# Patient Record
Sex: Female | Born: 1955 | Race: White | Hispanic: No | Marital: Married | Smoking: Former smoker
Health system: Southern US, Community
[De-identification: ages and names within clinical notes are randomized; demographics above are authoritative.]

## PROBLEM LIST (undated history)

## (undated) DIAGNOSIS — R7303 Prediabetes: Secondary | ICD-10-CM

## (undated) DIAGNOSIS — M7989 Other specified soft tissue disorders: Secondary | ICD-10-CM

## (undated) DIAGNOSIS — R609 Edema, unspecified: Secondary | ICD-10-CM

## (undated) DIAGNOSIS — R5383 Other fatigue: Secondary | ICD-10-CM

## (undated) DIAGNOSIS — M2550 Pain in unspecified joint: Secondary | ICD-10-CM

## (undated) DIAGNOSIS — C50919 Malignant neoplasm of unspecified site of unspecified female breast: Secondary | ICD-10-CM

## (undated) DIAGNOSIS — K5900 Constipation, unspecified: Secondary | ICD-10-CM

## (undated) DIAGNOSIS — R42 Dizziness and giddiness: Secondary | ICD-10-CM

## (undated) DIAGNOSIS — Z800 Family history of malignant neoplasm of digestive organs: Secondary | ICD-10-CM

## (undated) DIAGNOSIS — I890 Lymphedema, not elsewhere classified: Secondary | ICD-10-CM

## (undated) DIAGNOSIS — Z9889 Other specified postprocedural states: Secondary | ICD-10-CM

## (undated) DIAGNOSIS — Z8049 Family history of malignant neoplasm of other genital organs: Secondary | ICD-10-CM

## (undated) DIAGNOSIS — Z923 Personal history of irradiation: Secondary | ICD-10-CM

## (undated) DIAGNOSIS — M549 Dorsalgia, unspecified: Secondary | ICD-10-CM

## (undated) DIAGNOSIS — Z8042 Family history of malignant neoplasm of prostate: Secondary | ICD-10-CM

## (undated) DIAGNOSIS — D649 Anemia, unspecified: Secondary | ICD-10-CM

## (undated) DIAGNOSIS — R112 Nausea with vomiting, unspecified: Secondary | ICD-10-CM

## (undated) DIAGNOSIS — Z803 Family history of malignant neoplasm of breast: Secondary | ICD-10-CM

## (undated) DIAGNOSIS — M1990 Unspecified osteoarthritis, unspecified site: Secondary | ICD-10-CM

## (undated) DIAGNOSIS — G4730 Sleep apnea, unspecified: Secondary | ICD-10-CM

## (undated) DIAGNOSIS — R0602 Shortness of breath: Secondary | ICD-10-CM

## (undated) DIAGNOSIS — M7918 Myalgia, other site: Secondary | ICD-10-CM

## (undated) DIAGNOSIS — E7800 Pure hypercholesterolemia, unspecified: Secondary | ICD-10-CM

## (undated) HISTORY — DX: Family history of malignant neoplasm of breast: Z80.3

## (undated) HISTORY — DX: Shortness of breath: R06.02

## (undated) HISTORY — DX: Edema, unspecified: R60.9

## (undated) HISTORY — DX: Malignant neoplasm of unspecified site of unspecified female breast: C50.919

## (undated) HISTORY — DX: Dorsalgia, unspecified: M54.9

## (undated) HISTORY — DX: Pain in unspecified joint: M25.50

## (undated) HISTORY — DX: Anemia, unspecified: D64.9

## (undated) HISTORY — DX: Other specified soft tissue disorders: M79.89

## (undated) HISTORY — DX: Sleep apnea, unspecified: G47.30

## (undated) HISTORY — DX: Pure hypercholesterolemia, unspecified: E78.00

## (undated) HISTORY — DX: Family history of malignant neoplasm of other genital organs: Z80.49

## (undated) HISTORY — DX: Constipation, unspecified: K59.00

## (undated) HISTORY — PX: OTHER SURGICAL HISTORY: SHX169

## (undated) HISTORY — DX: Myalgia, other site: M79.18

## (undated) HISTORY — DX: Family history of malignant neoplasm of prostate: Z80.42

## (undated) HISTORY — DX: Family history of malignant neoplasm of digestive organs: Z80.0

## (undated) HISTORY — PX: BACK SURGERY: SHX140

## (undated) HISTORY — DX: Other fatigue: R53.83

## (undated) HISTORY — DX: Dizziness and giddiness: R42

---

## 1992-01-31 HISTORY — PX: BREAST LUMPECTOMY: SHX2

## 1993-01-30 HISTORY — PX: CERVICAL FUSION: SHX112

## 1998-03-05 ENCOUNTER — Emergency Department (HOSPITAL_COMMUNITY): Admission: EM | Admit: 1998-03-05 | Discharge: 1998-03-05 | Payer: Self-pay | Admitting: Emergency Medicine

## 1998-03-05 ENCOUNTER — Encounter: Payer: Self-pay | Admitting: Emergency Medicine

## 1998-11-02 ENCOUNTER — Other Ambulatory Visit: Admission: RE | Admit: 1998-11-02 | Discharge: 1998-11-02 | Payer: Self-pay | Admitting: Obstetrics and Gynecology

## 1999-03-15 ENCOUNTER — Encounter: Payer: Self-pay | Admitting: Neurological Surgery

## 1999-03-15 ENCOUNTER — Ambulatory Visit (HOSPITAL_COMMUNITY): Admission: RE | Admit: 1999-03-15 | Discharge: 1999-03-15 | Payer: Self-pay | Admitting: Neurological Surgery

## 1999-04-13 ENCOUNTER — Ambulatory Visit (HOSPITAL_COMMUNITY): Admission: RE | Admit: 1999-04-13 | Discharge: 1999-04-13 | Payer: Self-pay | Admitting: Oncology

## 1999-04-13 ENCOUNTER — Encounter: Payer: Self-pay | Admitting: Oncology

## 2000-10-19 ENCOUNTER — Encounter: Payer: Self-pay | Admitting: Oncology

## 2000-10-19 ENCOUNTER — Encounter: Admission: RE | Admit: 2000-10-19 | Discharge: 2000-10-19 | Payer: Self-pay | Admitting: Oncology

## 2001-10-23 ENCOUNTER — Encounter: Payer: Self-pay | Admitting: Oncology

## 2001-10-23 ENCOUNTER — Encounter: Admission: RE | Admit: 2001-10-23 | Discharge: 2001-10-23 | Payer: Self-pay | Admitting: Oncology

## 2002-01-30 HISTORY — PX: WISDOM TOOTH EXTRACTION: SHX21

## 2002-11-13 ENCOUNTER — Encounter: Admission: RE | Admit: 2002-11-13 | Discharge: 2002-11-13 | Payer: Self-pay | Admitting: Oncology

## 2002-11-13 ENCOUNTER — Encounter: Payer: Self-pay | Admitting: Oncology

## 2003-01-31 HISTORY — PX: BILATERAL TOTAL MASTECTOMY WITH AXILLARY LYMPH NODE DISSECTION: SHX6364

## 2003-01-31 HISTORY — PX: MASTECTOMY: SHX3

## 2003-04-14 ENCOUNTER — Other Ambulatory Visit: Admission: RE | Admit: 2003-04-14 | Discharge: 2003-04-14 | Payer: Self-pay | Admitting: Obstetrics and Gynecology

## 2003-12-02 ENCOUNTER — Encounter: Admission: RE | Admit: 2003-12-02 | Discharge: 2003-12-02 | Payer: Self-pay | Admitting: Oncology

## 2003-12-09 ENCOUNTER — Encounter (INDEPENDENT_AMBULATORY_CARE_PROVIDER_SITE_OTHER): Payer: Self-pay | Admitting: *Deleted

## 2003-12-09 ENCOUNTER — Encounter (INDEPENDENT_AMBULATORY_CARE_PROVIDER_SITE_OTHER): Payer: Self-pay | Admitting: Diagnostic Radiology

## 2003-12-09 ENCOUNTER — Encounter: Admission: RE | Admit: 2003-12-09 | Discharge: 2003-12-09 | Payer: Self-pay | Admitting: Oncology

## 2003-12-16 ENCOUNTER — Encounter (HOSPITAL_COMMUNITY): Admission: RE | Admit: 2003-12-16 | Discharge: 2004-03-15 | Payer: Self-pay | Admitting: Surgery

## 2003-12-17 ENCOUNTER — Encounter: Admission: RE | Admit: 2003-12-17 | Discharge: 2003-12-17 | Payer: Self-pay | Admitting: Surgery

## 2004-01-26 ENCOUNTER — Encounter (INDEPENDENT_AMBULATORY_CARE_PROVIDER_SITE_OTHER): Payer: Self-pay | Admitting: Specialist

## 2004-01-26 ENCOUNTER — Ambulatory Visit (HOSPITAL_COMMUNITY): Admission: RE | Admit: 2004-01-26 | Discharge: 2004-01-27 | Payer: Self-pay | Admitting: Surgery

## 2004-02-04 ENCOUNTER — Ambulatory Visit: Payer: Self-pay | Admitting: Oncology

## 2004-09-21 ENCOUNTER — Ambulatory Visit: Payer: Self-pay | Admitting: Internal Medicine

## 2004-10-06 ENCOUNTER — Other Ambulatory Visit: Admission: RE | Admit: 2004-10-06 | Discharge: 2004-10-06 | Payer: Self-pay | Admitting: Internal Medicine

## 2004-10-06 ENCOUNTER — Ambulatory Visit: Payer: Self-pay | Admitting: Internal Medicine

## 2005-02-20 ENCOUNTER — Ambulatory Visit: Payer: Self-pay | Admitting: Internal Medicine

## 2005-02-20 ENCOUNTER — Encounter: Admission: RE | Admit: 2005-02-20 | Discharge: 2005-02-20 | Payer: Self-pay | Admitting: Internal Medicine

## 2005-03-02 ENCOUNTER — Ambulatory Visit (HOSPITAL_COMMUNITY): Admission: RE | Admit: 2005-03-02 | Discharge: 2005-03-02 | Payer: Self-pay | Admitting: Urology

## 2005-03-13 ENCOUNTER — Ambulatory Visit: Payer: Self-pay | Admitting: Internal Medicine

## 2005-03-15 ENCOUNTER — Encounter: Admission: RE | Admit: 2005-03-15 | Discharge: 2005-04-19 | Payer: Self-pay | Admitting: Internal Medicine

## 2005-04-18 ENCOUNTER — Encounter: Admission: RE | Admit: 2005-04-18 | Discharge: 2005-04-25 | Payer: Self-pay | Admitting: Neurological Surgery

## 2005-08-01 ENCOUNTER — Encounter: Admission: RE | Admit: 2005-08-01 | Discharge: 2005-08-01 | Payer: Self-pay | Admitting: Otolaryngology

## 2005-10-13 ENCOUNTER — Ambulatory Visit: Payer: Self-pay | Admitting: Internal Medicine

## 2006-07-03 ENCOUNTER — Encounter: Payer: Self-pay | Admitting: Internal Medicine

## 2006-08-16 DIAGNOSIS — Z853 Personal history of malignant neoplasm of breast: Secondary | ICD-10-CM | POA: Insufficient documentation

## 2006-10-16 ENCOUNTER — Other Ambulatory Visit: Admission: RE | Admit: 2006-10-16 | Discharge: 2006-10-16 | Payer: Self-pay | Admitting: Internal Medicine

## 2006-10-16 ENCOUNTER — Encounter: Payer: Self-pay | Admitting: Internal Medicine

## 2006-10-16 ENCOUNTER — Ambulatory Visit: Payer: Self-pay | Admitting: Internal Medicine

## 2006-10-16 DIAGNOSIS — F17200 Nicotine dependence, unspecified, uncomplicated: Secondary | ICD-10-CM | POA: Insufficient documentation

## 2006-10-16 LAB — CONVERTED CEMR LAB
Bilirubin Urine: NEGATIVE
Glucose, Urine, Semiquant: NEGATIVE
Ketones, urine, test strip: NEGATIVE
Nitrite: NEGATIVE
Protein, U semiquant: NEGATIVE
Specific Gravity, Urine: 1.02
Urobilinogen, UA: 0.2
WBC Urine, dipstick: NEGATIVE
pH: 7.5

## 2006-10-17 LAB — CONVERTED CEMR LAB
ALT: 16 units/L (ref 0–35)
AST: 21 units/L (ref 0–37)
Albumin: 3.9 g/dL (ref 3.5–5.2)
Alkaline Phosphatase: 100 units/L (ref 39–117)
BUN: 12 mg/dL (ref 6–23)
Basophils Absolute: 0 10*3/uL (ref 0.0–0.1)
Basophils Relative: 0.1 % (ref 0.0–1.0)
Bilirubin, Direct: 0.2 mg/dL (ref 0.0–0.3)
CO2: 31 meq/L (ref 19–32)
Calcium: 9.7 mg/dL (ref 8.4–10.5)
Chloride: 108 meq/L (ref 96–112)
Cholesterol: 190 mg/dL (ref 0–200)
Creatinine, Ser: 0.9 mg/dL (ref 0.4–1.2)
Eosinophils Absolute: 0.1 10*3/uL (ref 0.0–0.6)
Eosinophils Relative: 1 % (ref 0.0–5.0)
GFR calc Af Amer: 85 mL/min
GFR calc non Af Amer: 70 mL/min
Glucose, Bld: 84 mg/dL (ref 70–99)
HCT: 38.6 % (ref 36.0–46.0)
HDL: 45.3 mg/dL (ref >39.0)
Hemoglobin: 13.2 g/dL (ref 12.0–15.0)
LDL Cholesterol: 119 mg/dL — ABNORMAL HIGH (ref 0–99)
Lymphocytes Relative: 28.2 % (ref 12.0–46.0)
MCHC: 34.3 g/dL (ref 30.0–36.0)
MCV: 95.8 fL (ref 78.0–100.0)
Monocytes Absolute: 0.6 10*3/uL (ref 0.2–0.7)
Monocytes Relative: 6 % (ref 3.0–11.0)
Neutro Abs: 6.8 10*3/uL (ref 1.4–7.7)
Neutrophils Relative %: 64.7 % (ref 43.0–77.0)
Platelets: 283 10*3/uL (ref 150–400)
Potassium: 4.7 meq/L (ref 3.5–5.1)
RBC: 4.02 M/uL (ref 3.87–5.11)
RDW: 11.9 % (ref 11.5–14.6)
Sodium: 144 meq/L (ref 135–145)
TSH: 2.91 microintl units/mL (ref 0.35–5.50)
Total Bilirubin: 0.8 mg/dL (ref 0.3–1.2)
Total CHOL/HDL Ratio: 4.2
Total Protein: 6.7 g/dL (ref 6.0–8.3)
Triglycerides: 128 mg/dL (ref 0–149)
VLDL: 26 mg/dL (ref 0–40)
WBC: 10.5 10*3/uL (ref 4.5–10.5)

## 2006-10-18 ENCOUNTER — Telehealth: Payer: Self-pay | Admitting: Internal Medicine

## 2006-11-04 ENCOUNTER — Encounter: Payer: Self-pay | Admitting: Internal Medicine

## 2006-11-07 ENCOUNTER — Telehealth: Payer: Self-pay | Admitting: Internal Medicine

## 2006-12-06 ENCOUNTER — Encounter: Payer: Self-pay | Admitting: Internal Medicine

## 2006-12-21 ENCOUNTER — Telehealth: Payer: Self-pay | Admitting: Internal Medicine

## 2007-03-13 ENCOUNTER — Encounter: Payer: Self-pay | Admitting: Internal Medicine

## 2007-04-23 ENCOUNTER — Ambulatory Visit: Payer: Self-pay | Admitting: Internal Medicine

## 2007-04-23 DIAGNOSIS — M549 Dorsalgia, unspecified: Secondary | ICD-10-CM

## 2007-04-23 DIAGNOSIS — M545 Low back pain, unspecified: Secondary | ICD-10-CM | POA: Insufficient documentation

## 2007-04-25 ENCOUNTER — Ambulatory Visit: Payer: Self-pay | Admitting: Internal Medicine

## 2007-05-24 ENCOUNTER — Telehealth (INDEPENDENT_AMBULATORY_CARE_PROVIDER_SITE_OTHER): Payer: Self-pay | Admitting: *Deleted

## 2007-06-03 ENCOUNTER — Ambulatory Visit: Payer: Self-pay | Admitting: Internal Medicine

## 2007-12-23 ENCOUNTER — Encounter: Payer: Self-pay | Admitting: Internal Medicine

## 2008-05-07 ENCOUNTER — Ambulatory Visit: Payer: Self-pay | Admitting: Family Medicine

## 2008-06-22 ENCOUNTER — Ambulatory Visit: Payer: Self-pay | Admitting: Occupational Medicine

## 2008-06-22 LAB — CONVERTED CEMR LAB: Rapid Strep: NEGATIVE

## 2008-06-23 ENCOUNTER — Encounter: Payer: Self-pay | Admitting: Occupational Medicine

## 2008-06-26 ENCOUNTER — Encounter: Payer: Self-pay | Admitting: Family Medicine

## 2008-06-26 ENCOUNTER — Telehealth (INDEPENDENT_AMBULATORY_CARE_PROVIDER_SITE_OTHER): Payer: Self-pay | Admitting: *Deleted

## 2008-10-09 ENCOUNTER — Ambulatory Visit: Payer: Self-pay | Admitting: Internal Medicine

## 2008-10-09 LAB — CONVERTED CEMR LAB
ALT: 26 units/L (ref 0–35)
AST: 27 units/L (ref 0–37)
Albumin: 4 g/dL (ref 3.5–5.2)
Alkaline Phosphatase: 103 units/L (ref 39–117)
BUN: 8 mg/dL (ref 6–23)
Basophils Absolute: 0 10*3/uL (ref 0.0–0.1)
Basophils Relative: 0.3 % (ref 0.0–3.0)
Bilirubin Urine: NEGATIVE
Bilirubin, Direct: 0 mg/dL (ref 0.0–0.3)
CO2: 31 meq/L (ref 19–32)
Calcium: 9.7 mg/dL (ref 8.4–10.5)
Chloride: 108 meq/L (ref 96–112)
Cholesterol: 187 mg/dL (ref 0–200)
Creatinine, Ser: 0.9 mg/dL (ref 0.4–1.2)
Eosinophils Absolute: 0.1 10*3/uL (ref 0.0–0.7)
Eosinophils Relative: 1.8 % (ref 0.0–5.0)
GFR calc non Af Amer: 69.8 mL/min (ref >60)
Glucose, Bld: 100 mg/dL — ABNORMAL HIGH (ref 70–99)
HCT: 38.6 % (ref 36.0–46.0)
HDL: 47.9 mg/dL (ref >39.00)
Hemoglobin: 13.2 g/dL (ref 12.0–15.0)
Ketones, ur: NEGATIVE mg/dL
LDL Cholesterol: 123 mg/dL — ABNORMAL HIGH (ref 0–99)
Leukocytes, UA: NEGATIVE
Lymphocytes Relative: 27.8 % (ref 12.0–46.0)
Lymphs Abs: 2.1 10*3/uL (ref 0.7–4.0)
MCHC: 34 g/dL (ref 30.0–36.0)
MCV: 96.9 fL (ref 78.0–100.0)
Monocytes Absolute: 0.5 10*3/uL (ref 0.1–1.0)
Monocytes Relative: 6.7 % (ref 3.0–12.0)
Neutro Abs: 4.8 10*3/uL (ref 1.4–7.7)
Neutrophils Relative %: 63.4 % (ref 43.0–77.0)
Nitrite: NEGATIVE
Platelets: 284 10*3/uL (ref 150.0–400.0)
Potassium: 4.9 meq/L (ref 3.5–5.1)
RBC: 3.99 M/uL (ref 3.87–5.11)
RDW: 11.7 % (ref 11.5–14.6)
Sodium: 142 meq/L (ref 135–145)
Specific Gravity, Urine: 1.02 (ref 1.000–1.030)
TSH: 2.32 microintl units/mL (ref 0.35–5.50)
Total Bilirubin: 0.7 mg/dL (ref 0.3–1.2)
Total CHOL/HDL Ratio: 4
Total Protein, Urine: NEGATIVE mg/dL
Total Protein: 7 g/dL (ref 6.0–8.3)
Triglycerides: 79 mg/dL (ref 0.0–149.0)
Urine Glucose: NEGATIVE mg/dL
Urobilinogen, UA: 0.2 (ref 0.0–1.0)
VLDL: 15.8 mg/dL (ref 0.0–40.0)
WBC: 7.5 10*3/uL (ref 4.5–10.5)
pH: 6 (ref 5.0–8.0)

## 2008-10-16 ENCOUNTER — Ambulatory Visit: Payer: Self-pay | Admitting: Internal Medicine

## 2009-03-30 ENCOUNTER — Ambulatory Visit: Payer: Self-pay | Admitting: Internal Medicine

## 2009-03-30 DIAGNOSIS — R9389 Abnormal findings on diagnostic imaging of other specified body structures: Secondary | ICD-10-CM | POA: Insufficient documentation

## 2009-04-06 ENCOUNTER — Ambulatory Visit: Payer: Self-pay | Admitting: Internal Medicine

## 2009-04-08 ENCOUNTER — Telehealth: Payer: Self-pay | Admitting: Family Medicine

## 2009-04-22 ENCOUNTER — Ambulatory Visit (HOSPITAL_COMMUNITY): Admission: RE | Admit: 2009-04-22 | Discharge: 2009-04-22 | Payer: Self-pay | Admitting: Orthopaedic Surgery

## 2009-04-27 ENCOUNTER — Ambulatory Visit: Payer: Self-pay | Admitting: Oncology

## 2009-04-29 ENCOUNTER — Encounter: Payer: Self-pay | Admitting: Internal Medicine

## 2009-04-29 LAB — COMPREHENSIVE METABOLIC PANEL
ALT: 28 U/L (ref 0–35)
AST: 27 U/L (ref 0–37)
Albumin: 4.2 g/dL (ref 3.5–5.2)
Alkaline Phosphatase: 109 U/L (ref 39–117)
BUN: 13 mg/dL (ref 6–23)
CO2: 29 mEq/L (ref 19–32)
Calcium: 9.9 mg/dL (ref 8.4–10.5)
Chloride: 104 mEq/L (ref 96–112)
Creatinine, Ser: 0.89 mg/dL (ref 0.40–1.20)
Glucose, Bld: 106 mg/dL — ABNORMAL HIGH (ref 70–99)
Potassium: 4 mEq/L (ref 3.5–5.3)
Sodium: 139 mEq/L (ref 135–145)
Total Bilirubin: 0.4 mg/dL (ref 0.3–1.2)
Total Protein: 7.6 g/dL (ref 6.0–8.3)

## 2009-04-29 LAB — CANCER ANTIGEN 27.29: CA 27.29: 6 U/mL (ref 0–39)

## 2009-04-29 LAB — CBC WITH DIFFERENTIAL/PLATELET
BASO%: 0.4 % (ref 0.0–2.0)
Basophils Absolute: 0 10*3/uL (ref 0.0–0.1)
EOS%: 1.4 % (ref 0.0–7.0)
Eosinophils Absolute: 0.1 10*3/uL (ref 0.0–0.5)
HCT: 38.2 % (ref 34.8–46.6)
HGB: 12.9 g/dL (ref 11.6–15.9)
LYMPH%: 28.7 % (ref 14.0–49.7)
MCH: 32.1 pg (ref 25.1–34.0)
MCHC: 33.8 g/dL (ref 31.5–36.0)
MCV: 95 fL (ref 79.5–101.0)
MONO#: 0.7 10*3/uL (ref 0.1–0.9)
MONO%: 7.4 % (ref 0.0–14.0)
NEUT#: 6 10*3/uL (ref 1.5–6.5)
NEUT%: 62.1 % (ref 38.4–76.8)
Platelets: 289 10*3/uL (ref 145–400)
RBC: 4.02 10*6/uL (ref 3.70–5.45)
RDW: 12.5 % (ref 11.2–14.5)
WBC: 9.6 10*3/uL (ref 3.9–10.3)
lymph#: 2.8 10*3/uL (ref 0.9–3.3)

## 2009-05-04 ENCOUNTER — Ambulatory Visit (HOSPITAL_COMMUNITY): Admission: RE | Admit: 2009-05-04 | Discharge: 2009-05-04 | Payer: Self-pay | Admitting: Oncology

## 2009-05-04 ENCOUNTER — Encounter: Payer: Self-pay | Admitting: Family Medicine

## 2009-05-14 ENCOUNTER — Encounter: Payer: Self-pay | Admitting: Internal Medicine

## 2009-05-31 ENCOUNTER — Encounter: Admission: RE | Admit: 2009-05-31 | Discharge: 2009-08-29 | Payer: Self-pay | Admitting: Oncology

## 2009-07-06 ENCOUNTER — Ambulatory Visit: Payer: Self-pay | Admitting: Family Medicine

## 2009-07-06 DIAGNOSIS — M48061 Spinal stenosis, lumbar region without neurogenic claudication: Secondary | ICD-10-CM | POA: Insufficient documentation

## 2009-07-15 ENCOUNTER — Encounter: Admission: RE | Admit: 2009-07-15 | Discharge: 2009-08-05 | Payer: Self-pay | Admitting: Family Medicine

## 2009-07-19 ENCOUNTER — Encounter: Payer: Self-pay | Admitting: Family Medicine

## 2009-08-05 ENCOUNTER — Encounter: Payer: Self-pay | Admitting: Family Medicine

## 2009-08-06 ENCOUNTER — Ambulatory Visit: Payer: Self-pay | Admitting: Family Medicine

## 2009-08-12 ENCOUNTER — Encounter: Payer: Self-pay | Admitting: Family Medicine

## 2009-08-18 ENCOUNTER — Telehealth (INDEPENDENT_AMBULATORY_CARE_PROVIDER_SITE_OTHER): Payer: Self-pay | Admitting: *Deleted

## 2009-09-16 ENCOUNTER — Encounter: Payer: Self-pay | Admitting: Family Medicine

## 2009-10-05 ENCOUNTER — Encounter: Payer: Self-pay | Admitting: Family Medicine

## 2009-11-08 ENCOUNTER — Telehealth: Payer: Self-pay | Admitting: Family Medicine

## 2009-11-08 ENCOUNTER — Ambulatory Visit: Payer: Self-pay | Admitting: Family Medicine

## 2009-11-08 DIAGNOSIS — H8110 Benign paroxysmal vertigo, unspecified ear: Secondary | ICD-10-CM | POA: Insufficient documentation

## 2009-11-25 ENCOUNTER — Encounter: Payer: Self-pay | Admitting: Family Medicine

## 2010-01-03 ENCOUNTER — Encounter: Payer: Self-pay | Admitting: Family Medicine

## 2010-01-04 LAB — CONVERTED CEMR LAB
ALT: 20 units/L (ref 0–35)
AST: 21 units/L (ref 0–37)
Albumin: 4.3 g/dL (ref 3.5–5.2)
Alkaline Phosphatase: 99 units/L (ref 39–117)
BUN: 10 mg/dL (ref 6–23)
Basophils Absolute: 0 10*3/uL (ref 0.0–0.1)
Basophils Relative: 1 % (ref 0–1)
CO2: 29 meq/L (ref 19–32)
Calcium: 9.7 mg/dL (ref 8.4–10.5)
Chloride: 105 meq/L (ref 96–112)
Cholesterol: 197 mg/dL (ref 0–200)
Creatinine, Ser: 0.92 mg/dL (ref 0.40–1.20)
Eosinophils Absolute: 0.1 10*3/uL (ref 0.0–0.7)
Eosinophils Relative: 1 % (ref 0–5)
Glucose, Bld: 90 mg/dL (ref 70–99)
HCT: 40.9 % (ref 36.0–46.0)
HDL: 56 mg/dL (ref >39)
Hemoglobin: 12.8 g/dL (ref 12.0–15.0)
LDL Cholesterol: 117 mg/dL — ABNORMAL HIGH (ref 0–99)
Lymphocytes Relative: 27 % (ref 12–46)
Lymphs Abs: 2.4 10*3/uL (ref 0.7–4.0)
MCHC: 31.3 g/dL (ref 30.0–36.0)
MCV: 99.5 fL (ref 78.0–100.0)
Monocytes Absolute: 0.7 10*3/uL (ref 0.1–1.0)
Monocytes Relative: 8 % (ref 3–12)
Neutro Abs: 5.6 10*3/uL (ref 1.7–7.7)
Neutrophils Relative %: 63 % (ref 43–77)
Platelets: 303 10*3/uL (ref 150–400)
Potassium: 4.7 meq/L (ref 3.5–5.3)
RBC: 4.11 M/uL (ref 3.87–5.11)
RDW: 13.1 % (ref 11.5–15.5)
Sodium: 144 meq/L (ref 135–145)
Total Bilirubin: 0.3 mg/dL (ref 0.3–1.2)
Total CHOL/HDL Ratio: 3.5
Total Protein: 6.5 g/dL (ref 6.0–8.3)
Triglycerides: 121 mg/dL (ref <150)
VLDL: 24 mg/dL (ref 0–40)
WBC: 8.8 10*3/uL (ref 4.0–10.5)

## 2010-01-10 ENCOUNTER — Other Ambulatory Visit
Admission: RE | Admit: 2010-01-10 | Discharge: 2010-01-10 | Payer: Self-pay | Source: Home / Self Care | Admitting: Family Medicine

## 2010-01-10 ENCOUNTER — Ambulatory Visit: Payer: Self-pay | Admitting: Family Medicine

## 2010-01-10 DIAGNOSIS — Z780 Asymptomatic menopausal state: Secondary | ICD-10-CM | POA: Insufficient documentation

## 2010-01-10 LAB — CONVERTED CEMR LAB: Pap Smear: NORMAL

## 2010-01-13 LAB — CONVERTED CEMR LAB: Pap Smear: NEGATIVE

## 2010-02-19 ENCOUNTER — Encounter: Payer: Self-pay | Admitting: Surgery

## 2010-02-19 ENCOUNTER — Encounter: Payer: Self-pay | Admitting: Oncology

## 2010-02-22 ENCOUNTER — Encounter
Admission: RE | Admit: 2010-02-22 | Discharge: 2010-02-22 | Payer: Self-pay | Source: Home / Self Care | Attending: Family Medicine | Admitting: Family Medicine

## 2010-02-24 ENCOUNTER — Encounter: Payer: Self-pay | Admitting: Family Medicine

## 2010-03-03 NOTE — Letter (Signed)
Summary: [facility]  [facility]   Imported By: Lanelle Bal 09/27/2009 10:24:14  _____________________________________________________________________  External Attachment:    Type:   Image     Comment:   External Document
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
## 2010-03-03 NOTE — Progress Notes (Signed)
Summary: Medical History & Med List Brought by Patient  Medical History & Med List Brought by Patient   Imported By: Lanelle Bal 07/27/2009 11:01:09  _____________________________________________________________________  External Attachment:    Type:   Image     Comment:   External Document

## 2010-03-03 NOTE — Op Note (Signed)
Summary: [facility]  [facility]   Imported By: Sherian Rein 10/16/2009 11:09:30  _____________________________________________________________________  External Attachment:    Type:   Image     Comment:   External Document
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
## 2010-03-03 NOTE — Progress Notes (Signed)
Summary: pt needs lab orders mailed to her prior to her CPE  Phone Note Call from Patient   Caller: Patient Summary of Call: Pt scheduled a cpe for Monday 01/10/10 and she would like to have a order to get her labs prior to her appt... Will you please mail her the order so she can have them done at [facility]  in [place] where she works.Michaelle Copas  November 08, 2009 3:50 PM  Initial call taken by: Michaelle Copas,  November 08, 2009 3:50 PM  Follow-up for Phone Call        done. Follow-up by: Seymour Bars DO,  November 08, 2009 4:26 PM  New Problems: OTH&UNSPEC ENDOCRN NUTRIT METAB&IMMUNITY D/O (ICD-V77.99) SCREENING FOR LIPOID DISORDERS (ICD-V77.91)   New Problems: OTH&UNSPEC ENDOCRN NUTRIT METAB&IMMUNITY D/O (ICD-V77.99) SCREENING FOR LIPOID DISORDERS (ICD-V77.91)

## 2010-03-03 NOTE — Miscellaneous (Signed)
Summary: PT Initial Summary/[facility]  PT Initial [facility]   Imported By: Lanelle Bal 07/27/2009 12:10:16  _____________________________________________________________________  External Attachment:    Type:   Image     Comment:   External Document

## 2010-03-03 NOTE — Letter (Signed)
Summary: Out of Work  Adult nurse at [employer]  [address]   [place], [place] [address]   Phone: [phone]  Fax: [phone]    March 30, 2009   Employee:  Patricia Waters    To Whom It May Concern:   For Medical reasons, please excuse the above named employee from work for the following dates:  Start:   03/30/09  End:  04/05/09    If you need additional information, please feel free to contact our office.         Sincerely,    Gladis Riffle, RN for Birdie Sons, MD

## 2010-03-03 NOTE — Assessment & Plan Note (Signed)
Summary: vertigo   Vital Signs:  Patient profile:   55 year old female Menstrual status:  postmenopausal Height:      64.5 inches Weight:      228 pounds BMI:     38.67 O2 Sat:      96 % on Room air Pulse rate:   89 / minute BP sitting:   133 / 81  (left arm) Cuff size:   large  Vitals Entered By: Payton Spark CMA (November 08, 2009 3:11 PM)  O2 Flow:  Room air CC: F/U.    Primary Care Provider:  Seymour Bars DO  CC:  F/U. Marland Kitchen  History of Present Illness: 55 yo WF presents for an episode of vertigo that started years ago.  She has been seen by ENT in the past.  Dr May at [facility] had seen her in the past.  She had it severly in 07 and 08 and went thru a lot of testing.  Vertigo improved when she quit smoking, quit aspartame and cut back on salt.    2 wks ago, after a trip to the beach and eating out more, her vertigo started again with nausea and vomitting.  She took some phenergan.  She had to miss 2 days of work.    She took Valium in the past, sublingual at the onset of symptoms in the past.     Current Medications (verified): 1)  Budeprion Sr 150 Mg Tb12 (Bupropion Hcl) .... Take 1 Tab By Mouth Two Times A Day 2)  Multivitamins  Tabs (Multiple Vitamin) .... Once Daily 3)  Vitamin C 500 Mg Tabs (Ascorbic Acid) .... Once Daily 4)  Vitamin D3 1000 Unit Caps (Cholecalciferol) .... Once Daily 5)  Black Cohosh 540 Mg Caps (Black Cohosh) .... Once Daily 6)  Ginkoba 40 Mg Tabs (Ginkgo Biloba) .... Once Daily 7)  Fish Oil 1000 Mg Caps (Omega-3 Fatty Acids) .... Once Daily 8)  Metamucil Plus Calcium  Caps (Psyllium-Calcium) .... Two Times A Day 9)  Valium 2 Mg Tabs (Diazepam) .... Sublingual As Needed Vertigo 10)  Gabapentin 300 Mg Caps (Gabapentin) .... Take 2 Capsules By Mouth At Bedtime 11)  Diclofenac Sodium 75 Mg Tbec (Diclofenac Sodium) .Marland Kitchen.. 1 Tab By Mouth Qam With Breakfast 12)  Vicodin 5-500 Mg Tabs (Hydrocodone-Acetaminophen) .Marland Kitchen.. 1-2 Tabs By Mouth At Bedtime As Needed Back  Pain (Severe)  Allergies (verified): No Known Drug Allergies  Physical Exam  General:  alert, well-developed, well-nourished, and well-hydrated.  obese Head:  normocephalic and atraumatic.   Eyes:  pupils equal, pupils round, and pupils reactive to light.  no nystagmus Ears:  EACs patent; TMs translucent and Morgana Rowley with good cone of light and bony landmarks.  Nose:  no nasal discharge.   Mouth:  pharynx pink and moist.   Neck:  no masses.   Lungs:  Normal respiratory effort, chest expands symmetrically. Lungs are clear to auscultation, no crackles or wheezes. Heart:  Normal rate and regular rhythm. S1 and S2 normal without gallop, murmur, click, rub or other extra sounds. Neurologic:  no tremor   Impression & Recommendations:  Problem # 1:  BENIGN POSITIONAL VERTIGO (ICD-386.11) Had a recurrence of BPPV last wk after not having it since 08.  Likely triggered by consumption of high sodium foods while at the beach for a week.  RXs for Valium and Promethazine suppositories given for future vertigo episodes if needed.  If recurrning frequently, will get her back into Dr May.  Avoid smoking, aspartame and  high sodium foods which seem to be her triggers. Her updated medication list for this problem includes:    Promethazine Hcl 25 Mg Supp (Promethazine hcl) .Marland Kitchen... 1 suppository pr q 6 hrs as needed nausea  Complete Medication List: 1)  Budeprion Sr 150 Mg Tb12 (Bupropion hcl) .... Take 1 tab by mouth two times a day 2)  Multivitamins Tabs (Multiple vitamin) .... Once daily 3)  Vitamin C 500 Mg Tabs (Ascorbic acid) .... Once daily 4)  Vitamin D3 1000 Unit Caps (Cholecalciferol) .... Once daily 5)  Black Cohosh 540 Mg Caps (Black cohosh) .... Once daily 6)  Ginkoba 40 Mg Tabs (Ginkgo biloba) .... Once daily 7)  Fish Oil 1000 Mg Caps (Omega-3 fatty acids) .... Once daily 8)  Metamucil Plus Calcium Caps (Psyllium-calcium) .... Two times a day 9)  Valium 2 Mg Tabs (Diazepam) .Marland Kitchen.. 1-3 tabs by  mouth sublingual as needed at onset of vertigo 10)  Gabapentin 300 Mg Caps (Gabapentin) .... Take 2 capsules by mouth at bedtime 11)  Diclofenac Sodium 75 Mg Tbec (Diclofenac sodium) .Marland Kitchen.. 1 tab by mouth qam with breakfast 12)  Vicodin 5-500 Mg Tabs (Hydrocodone-acetaminophen) .Marland Kitchen.. 1-2 tabs by mouth at bedtime as needed back pain (severe) 13)  Promethazine Hcl 25 Mg Supp (Promethazine hcl) .Marland Kitchen.. 1 suppository pr q 6 hrs as needed nausea  Patient Instructions: 1)  Meds RFd. 2)  Schedule a PHYSICAL with fasting labs in 2 months. Prescriptions: PROMETHAZINE HCL 25 MG SUPP (PROMETHAZINE HCL) 1 suppository PR q 6 hrs as needed nausea  #15 x 1   Entered and Authorized by:   Seymour Bars DO   Signed by:   Seymour Bars DO on 11/08/2009   Method used:   Electronically to        [facility] [phone]* (retail)       [address]       [place], [place]  [address]       Ph: [phone] or [phone]       Fax: [phone]   RxID:   [id number] VALIUM 2 MG TABS (DIAZEPAM) 1-3 tabs by mouth sublingual as needed at onset of vertigo  #30 x 0   Entered and Authorized by:   Seymour Bars DO   Signed by:   Seymour Bars DO on 11/08/2009   Method used:   Printed then faxed to ...       [facility]  [address] [phone]* (retail)       [address] [place], [place]  [address]       Ph: [phone] or [phone]       Fax: [phone]   RxID:   [id number] GABAPENTIN 300 MG CAPS (GABAPENTIN) Take 2 capsules by mouth at bedtime  #180 x 1   Entered and Authorized by:   Seymour Bars DO   Signed by:   Seymour Bars DO on 11/08/2009   Method used:   Faxed to ...       [facility] (mail-order)             , [place]         Ph: [phone]       Fax: [phone]   RxID:   [id number] BUDEPRION SR 150 MG TB12 (BUPROPION HCL) Take 1 tab by mouth two times a day  #180 x 1   Entered and Authorized by:   Seymour Bars DO   Signed by:   Seymour Bars  DO on 11/08/2009   Method used:   Faxed to ...       [facility]  (mail-order)             , [place]         Ph: [phone]       Fax: [phone]   RxID:   [id number] DICLOFENAC SODIUM 75 MG TBEC (DICLOFENAC SODIUM) 1 tab by mouth qAM with breakfast  #90 x 1   Entered and Authorized by:   Seymour Bars DO   Signed by:   Seymour Bars DO on 11/08/2009   Method used:   Faxed to ...       [facility] (mail-order)             , [place]         Ph: [phone]       Fax: [phone]   RxID:   [id number]
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
## 2010-03-03 NOTE — Miscellaneous (Signed)
Summary: PT Discharge/[facility]  PT Discharge/[facility]   Imported By: Lanelle Bal 08/18/2009 11:56:20  _____________________________________________________________________  External Attachment:    Type:   Image     Comment:   External Document

## 2010-03-03 NOTE — Letter (Signed)
[facility]   Imported By: Maryln Gottron 06/22/2009 14:56:56  _____________________________________________________________________  External Attachment:    Type:   Image     Comment:   External Document

## 2010-03-03 NOTE — Letter (Signed)
Summary: [facility]  [facility]   Imported By: Lanelle Bal 12/03/2009 10:37:04  _____________________________________________________________________  External Attachment:    Type:   Image     Comment:   External Document

## 2010-03-03 NOTE — Letter (Signed)
[facility]   Imported By: Maryln Gottron 05/28/2009 15:39:40  _____________________________________________________________________  External Attachment:    Type:   Image     Comment:   External Document

## 2010-03-03 NOTE — Progress Notes (Signed)
Summary: BRCA 1 AND 2 ARE NEGATIVE  Phone Note Outgoing Call   Summary of Call: MICHELLE, PLS CALL PT AND LET HER KNOW THAT SHE IS NEGATIVE FOR THE BRCA-1 AND 2 MUTATION.  GOOD NEWS! WILL MAIL COPY TO PATIENT. Initial call taken by: Seymour Bars DO,  August 18, 2009 1:12 PM  Follow-up for Phone Call        LM for pt to return call Follow-up by: Kathlene November,  August 18, 2009 1:22 PM  Additional Follow-up for Phone Call Additional follow up Details #1::        Pt aware of the above Additional Follow-up by: Payton Spark CMA,  August 18, 2009 2:49 PM

## 2010-03-03 NOTE — Letter (Signed)
Summary: [facility]  [facility]   Imported By: Lanelle Bal 08/20/2009 14:16:02  _____________________________________________________________________  External Attachment:    Type:   Image     Comment:   External Document

## 2010-03-14 ENCOUNTER — Encounter: Payer: Self-pay | Admitting: Family Medicine

## 2010-03-23 NOTE — Letter (Signed)
Summary: [facility]  [facility]   Imported By: Kassie Mends 03/17/2010 08:49:04  _____________________________________________________________________  External Attachment:    Type:   Image     Comment:   External Document

## 2010-03-28 ENCOUNTER — Encounter: Payer: Self-pay | Admitting: Family Medicine

## 2010-04-07 NOTE — Letter (Signed)
Summary: [facility]   [facility]   Imported By: Kassie Mends 03/31/2010 09:04:01  _____________________________________________________________________  External Attachment:    Type:   Image     Comment:   External Document

## 2010-04-12 ENCOUNTER — Other Ambulatory Visit: Payer: Self-pay | Admitting: Obstetrics & Gynecology

## 2010-04-12 ENCOUNTER — Encounter (INDEPENDENT_AMBULATORY_CARE_PROVIDER_SITE_OTHER): Payer: 59 | Admitting: Obstetrics & Gynecology

## 2010-04-12 DIAGNOSIS — N950 Postmenopausal bleeding: Secondary | ICD-10-CM

## 2010-04-13 ENCOUNTER — Other Ambulatory Visit: Payer: Self-pay | Admitting: Obstetrics & Gynecology

## 2010-04-13 DIAGNOSIS — N950 Postmenopausal bleeding: Secondary | ICD-10-CM

## 2010-04-19 ENCOUNTER — Other Ambulatory Visit: Payer: 59

## 2010-04-19 ENCOUNTER — Ambulatory Visit
Admission: RE | Admit: 2010-04-19 | Discharge: 2010-04-19 | Disposition: A | Discharge status: Home / Self Care | Payer: 59 | Source: Ambulatory Visit | Attending: Obstetrics & Gynecology | Admitting: Obstetrics & Gynecology

## 2010-04-19 DIAGNOSIS — N950 Postmenopausal bleeding: Secondary | ICD-10-CM

## 2010-04-19 NOTE — Letter (Signed)
Summary: [facility]  [facility]   Imported By: Maryln Gottron 04/12/2010 11:30:55  _____________________________________________________________________  External Attachment:    Type:   Image     Comment:   External Document
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
## 2010-04-25 ENCOUNTER — Other Ambulatory Visit: Payer: 59

## 2010-04-26 ENCOUNTER — Ambulatory Visit (INDEPENDENT_AMBULATORY_CARE_PROVIDER_SITE_OTHER): Payer: 59 | Admitting: Obstetrics & Gynecology

## 2010-04-26 DIAGNOSIS — N924 Excessive bleeding in the premenopausal period: Secondary | ICD-10-CM

## 2010-05-03 NOTE — Assessment & Plan Note (Signed)
NAMENAHAL, WANLESS NO.:  [id number]  MEDICAL RECORD NO.:  [id number]            PATIENT TYPE:  LOCATION:  [facility] [place]           FACILITY:  PHYSICIAN:  Elsie Lincoln, MD           DATE OF BIRTH:  DATE OF SERVICE:  04/26/2010                                 CLINIC NOTE  The patient is a 55 year old female who experienced some postmenopausal bleeding.  The patient underwent an endometrial biopsy.  It showed polypoid proliferative phase endometrial breakdown, negative hyperplasia or malignancy, benign cervical mucosa.  No vaginal mucosa present.  I do not understand why there is vaginal mucosa that was listed.  It states that the specimen was submitted as a vaginal biopsy.  However, I looked at the requisition, and it did not mention that it was a vaginal biopsy specimen.  The vial was incorrectly labeled.  Transvaginal ultrasound was also performed, which showed a slightly fibroid uterus, which is not of any issue.  Endometrial stripe measuring 3 mm which is thin and homogeneous.  The patient is not experiencing any bleeding currently. The patient is relieved.  There is no issue at this current moment.  The patient is not on any hormones.  The patient is a survivor of breast cancer.  The patient may experience some more vaginal bleeding.  I told her not to be worried if it happens in the next couple of months. However, if she bleeds in 6 months or longer, then she is to come back for another evaluation, which include D and C and hysteroscopy.  The patient states she is up-to-date on her Pap smear by Dr. Cathey Endow, but she would like to see me in January for annual exam.  She is at low risk. She does not have HIV.  She does not seem to be DES-exposed.  She is not on immunosuppressive drugs.  The patient does not need a Pap smear for 3 years, but she is going to come to see me for her annual exam to make sure that there is no more postmenopausal bleeding.  The  patient brings her partner who also is very pleasant.  All vital signs were stable and all questions were answered.  The patient will come back to the clinic in January.          ______________________________ Elsie Lincoln, MD    KL/MEDQ  D:  04/26/2010  T:  04/27/2010  Job:  [id number]

## 2010-06-17 NOTE — Op Note (Signed)
Patricia Waters, Patricia Waters               ACCOUNT NO.:  [id number]   MEDICAL RECORD NO.:  [id number]          PATIENT TYPE:  OIB   LOCATION:  2899                         FACILITY:  [facility]   PHYSICIAN:  Currie Paris, M.D.DATE OF BIRTH:  22-Apr-1955   DATE OF PROCEDURE:  01/26/2004  DATE OF DISCHARGE:                                 OPERATIVE REPORT   OFFICE MEDICAL RECORD NUMBER:  [id number].   PREOPERATIVE DIAGNOSES:  1.  Left breast cancer.  2.  Status post lumpectomy for right breast cancer.   PROCEDURES:  1.  Left total mastectomy with blue dye injectionand sentinel node biopsy      (three nodes).  2.  Right total mastectomy.   SURGEON:  Currie Paris, M.D.   ASSISTANT:  Gabrielle Dare. Janee Morn, M.D.   ANESTHESIA:  General endotracheal.   CLINICAL HISTORY:  This patient is a 55 year old lady who in 1994 underwent  a lumpectomy for a stage 1 right breast cancer treated with radiation.  Recently she came back with microcalcifications in the left breast.  Stereo  biopsy showed high-grade DCIS and MRI showed an additional area in the left  breast that was worrisome.  After lengthy discussion with the patient, she  wished to have bilateral mastectomies at this point without reconstruction.  She was brought into the hospital for these plans.   DESCRIPTION OF PROCEDURE:  The patient was seen in the holding area and she  had no further questions.  The left breast was identified and marked both by  the patient and myself as the side that we were planning to do the sentinel  node.   The patient was taken to the operating room.  After satisfactory general  endotracheal anesthesia had been obtained, the breasts were prepped and  draped.  I injected about 5 mL of methylene blue subareolarly and massaged  it in, this being on the left side.  The left side was much larger than the  right considering that the right had had a prior lumpectomy plus radiation.  I outlined an elliptical  incision, taking what I thought would be an  adequate amount of skin to get a good closure without extra skin being left  behind, but also without being too tight.  The superior incision was made  and the superior flap raised going medially to the sternum, superior to the  clavicle and laterally out into the axilla.  Prior to starting, I had marked  an area in the axilla that was hot using the NeoProbe.  Once I got this  exposed, I divided through the fatty tissue to expose the axillary contents.  I found almost immediately a single blue node that was hot with counts of  about 1800.  This was removed and the NeoProbe showed a second area  adjacent.  A little more dissection showed a second node that had a blue  lymphatic leading into it and had counts of about 1200.  I did not feel any  other axillary palpable abnormalities, but using the NeoProbe there was a  third area just a little bit  higher that was hot and with a little  dissection I found a third small node that was not blue, but had counts of  about 900.  This was removed.  Once these three nodes were removed, there  were no blue nodes left and no palpably abnormal nodes left and x-ray counts  were down to a background of no higher than 15-20.  A pack was placed and  attention returned to the breast.   The inferior incision was made and an inferior flap raised to the sternum  medially, to the rectus sheath inferiorly and to the latissimus laterally.  The breast was then removed from the underlying muscle starting medially  working laterally, elevating off and taking the fascia.  Once the  clavipectoral fascia had been opened, the remaining attachments laterally  were divided, staying out of the axilla to avoid any further axillary  dissection.  Once this was completely disconnected, I made sure all of the  skin flaps appeared to be smooth and there was not any excess fatty tissue.  Everything appeared to be dry.  I placed two 19  Blake drains and secured  them with nylon.  I irrigated and made a final check for hemostasis.  I then  closed the incision with staples.   Attention was then turned to the right side where the patient desired to  have a prophylactic right total mastectomy.  Again I made transverse  incisions really just as wide as the nipple areolar complex here because  this side was smaller and did not have as much excess skin.  The skin flaps  were raised in identical fashion to the left.  Once they were raised, the  breast was removed from medial to lateral.  We did not enter the axilla.  Bleeders again were controlled with cautery.  Once everything was dry, two  19 Blake drains were placed.  We did a final irrigation and a final check  for hemostasis and closed with staples.  The drains were recharged.  During  the time we had been working on the right side there did not appear to have  any blood collections or bleeding on the left.  Dry sterile dressings were  applied.  The patient tolerated the procedure well.  There were no operative  complications.  All counts were correct.  The estimated blood loss was 200  mL.      Chri   CJS/MEDQ  D:  01/26/2004  T:  01/26/2004  Job:  [id number]   cc:   Wilson Singer, M.D.  [address]  [place]  [place] [address]  Fax: [phone]   Valentino Hue. Magrinat, M.D.  [address] [facility]  [place]  [place] [address]  Fax: [phone]

## 2010-06-19 ENCOUNTER — Other Ambulatory Visit: Payer: Self-pay | Admitting: Family Medicine

## 2010-06-23 ENCOUNTER — Other Ambulatory Visit: Payer: Self-pay | Admitting: Family Medicine

## 2010-07-13 ENCOUNTER — Other Ambulatory Visit: Payer: Self-pay | Admitting: Family Medicine

## 2010-09-03 ENCOUNTER — Other Ambulatory Visit: Payer: Self-pay | Admitting: Family Medicine

## 2011-01-12 ENCOUNTER — Ambulatory Visit (INDEPENDENT_AMBULATORY_CARE_PROVIDER_SITE_OTHER): Payer: 59 | Admitting: Family Medicine

## 2011-01-12 ENCOUNTER — Encounter: Payer: Self-pay | Admitting: Family Medicine

## 2011-01-12 VITALS — BP 131/72 | HR 84 | Ht 64.5 in | Wt 223.0 lb

## 2011-01-12 DIAGNOSIS — H9191 Unspecified hearing loss, right ear: Secondary | ICD-10-CM

## 2011-01-12 DIAGNOSIS — Z0000 Encounter for general adult medical examination without abnormal findings: Secondary | ICD-10-CM

## 2011-01-12 DIAGNOSIS — Z853 Personal history of malignant neoplasm of breast: Secondary | ICD-10-CM

## 2011-01-12 DIAGNOSIS — M549 Dorsalgia, unspecified: Secondary | ICD-10-CM

## 2011-01-12 DIAGNOSIS — H9190 Unspecified hearing loss, unspecified ear: Secondary | ICD-10-CM

## 2011-01-12 MED ORDER — AMBULATORY NON FORMULARY MEDICATION: Status: DC

## 2011-01-12 MED ORDER — METHOCARBAMOL 500 MG PO TABS: 500.0000 mg | ORAL_TABLET | Freq: Two times a day (BID) | ORAL | Status: AC | PRN

## 2011-01-12 MED ORDER — GABAPENTIN 300 MG PO CAPS: 600.0000 mg | ORAL_CAPSULE | Freq: Every day | ORAL | Status: DC

## 2011-01-12 MED ORDER — BUPROPION HCL 75 MG PO TABS: 75.0000 mg | ORAL_TABLET | Freq: Two times a day (BID) | ORAL | Status: DC

## 2011-01-12 MED ORDER — DULOXETINE HCL 30 MG PO CPEP: 30.0000 mg | ORAL_CAPSULE | Freq: Every day | ORAL | Status: DC

## 2011-01-12 NOTE — Progress Notes (Signed)
Chronic Back Pain - She is concerned about her chronic use of NSAIDs and their cardiac effects. She is currently using volteran. She also wonders about cymbalta as an option for her pain.  We discussed options adn potential SE of cymbalta and she wouldl ike to try this. F/U in 6 weeks adn adjust dose as needed.  She would like refill on robaxin as well. Continue neurontin at bedtime.  Needs weight loss.   Discussed shingles vaccine. Encouraged her to check with insurance first for coverage  Also needs new rx for orthotics for her heel spurs and her plantar fascitis.   Subjective:     Patricia Waters is a 55 y.o. female and is here for a comprehensive physical exam. The patient reports no problems.  History   Social History  . Marital Status: Single    Spouse Name: N/A    Number of Children: N/A  . Years of Education: N/A   Occupational History  . Not on file.   Social History Main Topics  . Smoking status: Former Smoker    Quit date: 01/12/2007  . Smokeless tobacco: Not on file  . Alcohol Use: Not on file  . Drug Use: Not on file  . Sexually Active: Not on file   Other Topics Concern  . Not on file   Social History Narrative  . No narrative on file   Health Maintenance  Topic Date Due  . Influenza Vaccine  10/31/2011  . Pap Smear  01/30/2013  . Colonoscopy  01/30/2017  . Tetanus/tdap  10/17/2018    The following portions of the patient's history were reviewed and updated as appropriate: allergies, current medications, past family history, past medical history, past social history, past surgical history and problem list.  Review of Systems A comprehensive review of systems was negative.   Objective:    BP 131/72  Pulse 84  Ht 5' 4.5" (1.638 m)  Wt 223 lb (101.152 kg)  BMI 37.69 kg/m2 General appearance: alert, cooperative, appears stated age and moderately obese Head: Normocephalic, without obvious abnormality, atraumatic Eyes:  conjunctiva clear, extraocular  movements intact, pupils equal round react to light. Ears: normal TM's and external ear canals both ears Nose: Nares normal. Septum midline. Mucosa normal. No drainage or sinus tenderness. Throat: lips, mucosa, and tongue normal; teeth and gums normal Neck: no adenopathy, no carotid bruit, no JVD, supple, symmetrical, trachea midline and thyroid not enlarged, symmetric, no tenderness/mass/nodules Back: symmetric, no curvature. ROM normal. No CVA tenderness. Lungs: clear to auscultation bilaterally Heart: regular rate and rhythm, S1, S2 normal, no murmur, click, rub or gallop Abdomen: soft, non-tender; bowel sounds normal; no masses,  no organomegaly Extremities: extremities normal, atraumatic, no cyanosis or edema Pulses: 2+ and symmetric Skin: Skin color, texture, turgor normal. No rashes or lesions Lymph nodes: Cervical and supraclavicular nodes are normal. Neurologic: Alert and oriented X 3, normal strength and tone. Normal symmetric reflexes. Normal coordination and gait    Assessment:    Healthy female exam.     Plan:     See After Visit Summary for Counseling Recommendations  Start a regular exercise program and make sure you are eating a healthy diet Try to eat 4 servings of dairy a day or take a calcium supplement (500mg  twice a day). Your vaccines are up to date.  Has pelvic exam sched for January with GYN

## 2011-01-12 NOTE — Patient Instructions (Signed)
Start a regular exercise program and make sure you are eating a healthy diet Try to eat 4 servings of dairy a day or take a calcium supplement (500mg twice a day). Your vaccines are up to date.   

## 2011-02-03 LAB — LIPID PANEL
Cholesterol: 186 mg/dL (ref 0–200)
HDL: 57 mg/dL (ref >39)
LDL Cholesterol: 107 mg/dL — ABNORMAL HIGH (ref 0–99)
Triglycerides: 109 mg/dL (ref <150)

## 2011-02-03 LAB — CBC
HCT: 43.6 % (ref 36.0–46.0)
Hemoglobin: 14.1 g/dL (ref 12.0–15.0)
MCH: 31.5 pg (ref 26.0–34.0)
MCHC: 32.3 g/dL (ref 30.0–36.0)
MCV: 97.3 fL (ref 78.0–100.0)
RDW: 12.5 % (ref 11.5–15.5)

## 2011-02-03 LAB — COMPLETE METABOLIC PANEL WITH GFR
ALT: 14 U/L (ref 0–35)
BUN: 15 mg/dL (ref 6–23)
CO2: 28 mEq/L (ref 19–32)
Calcium: 9.9 mg/dL (ref 8.4–10.5)
Creat: 0.82 mg/dL (ref 0.50–1.10)
GFR, Est African American: >89 mL/min
Total Bilirubin: 0.4 mg/dL (ref 0.3–1.2)

## 2011-02-13 ENCOUNTER — Ambulatory Visit (INDEPENDENT_AMBULATORY_CARE_PROVIDER_SITE_OTHER): Payer: 59 | Admitting: Family Medicine

## 2011-02-13 ENCOUNTER — Encounter: Payer: Self-pay | Admitting: Family Medicine

## 2011-02-13 VITALS — BP 128/85 | HR 75 | Temp 98.9°F | Ht 66.0 in | Wt 220.0 lb

## 2011-02-13 DIAGNOSIS — E785 Hyperlipidemia, unspecified: Secondary | ICD-10-CM

## 2011-02-13 DIAGNOSIS — M4800 Spinal stenosis, site unspecified: Secondary | ICD-10-CM

## 2011-02-13 MED ORDER — DULOXETINE HCL 30 MG PO CPEP: 30.0000 mg | ORAL_CAPSULE | Freq: Every day | ORAL | Status: DC

## 2011-02-13 NOTE — Progress Notes (Signed)
  Subjective:    Patient ID: Patricia Waters, female    DOB: Nov 29, 1955, 56 y.o.   MRN: [id number]  HPI Dong well onteh cymbalta. Has dry mouth. Has cut ouit fried, fast foods and sodas.  Has lost 3 lbs. she is not exercising regularly but has tried to change her diet. She has cut out fast foods.  She would like to go over her lab results today including her cholesterol. I gave her a copy of all her lab work today. We sat down with her and her partner to review the results.   Review of Systems     Objective:   Physical Exam  Constitutional: She is oriented to person, place, and time. She appears well-developed and well-nourished.  HENT:  Head: Normocephalic and atraumatic.  Cardiovascular: Normal rate, regular rhythm and normal heart sounds.   Pulmonary/Chest: Effort normal and breath sounds normal.  Neurological: She is alert and oriented to person, place, and time.  Skin: Skin is warm and dry.  Psychiatric: She has a normal mood and affect. Her behavior is normal.          Assessment & Plan:  Still waning off her wellubrin she did have a couple of questions about the weaning. She should be off the medication, see her back.  Cholesterol-was just above goal. We discussed continuing with her exercise, diet and weight loss and recheck in 6-12 months. Hopefully she will be at goal without medication.  Back pain - happy with her cymbalta. She would like to continue her to her current dose. If we run into any problems or pain worsens we can consider increasing her to 60 mg. I think most importantly working on regular exercise and weight loss will also help her back.

## 2011-03-20 ENCOUNTER — Other Ambulatory Visit: Payer: 59

## 2011-03-20 ENCOUNTER — Ambulatory Visit: Payer: 59

## 2011-03-20 NOTE — Progress Notes (Signed)
Pt seen for genetic counseling. Blood drawn for B[facility] and sent to [facility]. Previous sequencing years ago was negative. Blood drawn for [facility] and sent to [facility]. Daughter of Patricia Waters.

## 2011-03-22 ENCOUNTER — Other Ambulatory Visit: Payer: Self-pay | Admitting: *Deleted

## 2011-03-22 MED ORDER — DULOXETINE HCL 30 MG PO CPEP: 30.0000 mg | ORAL_CAPSULE | Freq: Every day | ORAL | Status: DC

## 2011-03-29 ENCOUNTER — Telehealth: Payer: Self-pay | Admitting: Genetic Counselor

## 2011-06-13 ENCOUNTER — Ambulatory Visit (INDEPENDENT_AMBULATORY_CARE_PROVIDER_SITE_OTHER): Payer: 59 | Admitting: Family Medicine

## 2011-06-13 ENCOUNTER — Encounter: Payer: Self-pay | Admitting: Family Medicine

## 2011-06-13 VITALS — BP 111/74 | HR 97 | Ht 66.0 in | Wt 212.0 lb

## 2011-06-13 DIAGNOSIS — E669 Obesity, unspecified: Secondary | ICD-10-CM

## 2011-06-13 DIAGNOSIS — M545 Low back pain, unspecified: Secondary | ICD-10-CM

## 2011-06-13 MED ORDER — DULOXETINE HCL 30 MG PO CPEP: 30.0000 mg | ORAL_CAPSULE | Freq: Every day | ORAL | Status: DC

## 2011-06-13 MED ORDER — GABAPENTIN 300 MG PO CAPS: 600.0000 mg | ORAL_CAPSULE | Freq: Every day | ORAL | Status: DC

## 2011-06-13 NOTE — Progress Notes (Signed)
  Subjective:    Patient ID: Patricia Waters, female    DOB: [birth year], 56 y.o.   MRN: [id number]  HPI BAck Pain - doing really well. Wants to stay on current dose of cymbalta. She is off the wellbutrin. Still not smoking.  Has lost 8 lbs.  Not really exercising but has been more active with gardening.  She is no longer taking NSAIDs for her back pain. She is here with her partner today. She's not getting any regular exercise.   Review of Systems     Objective:   Physical Exam  Constitutional: She appears well-developed and well-nourished.  Musculoskeletal:       Lumbar spine with normal flexion, extension, rotation, side bending. She is tender with lumbar extension. She is also tender over the right and left SI joints. More tender to the right. Nontender over the lumbar spine. Negative straight leg raise. Hips, knees, ankles 5/5 in strength bilaterally.          Assessment & Plan:  Back pain-well controlled. Continue current dose of Cymbalta. 90 day supply sent to mail order. Can use an NSAID as needed. Did encourage more active regular exercise to continue to work on her weight. Explained how this will also help her back pain. Followup in 6 months. Also refilled her Neurontin.  Obesity-congratulated her on her 8 pound weight loss. Continue to work on exercise and diet. Discussed swimming as a possibility. She was excited about the thought . She says she used to swim for exercise.

## 2011-06-15 ENCOUNTER — Telehealth: Payer: Self-pay | Admitting: Genetic Counselor

## 2011-10-12 ENCOUNTER — Encounter: Payer: Self-pay | Admitting: Family Medicine

## 2011-10-12 ENCOUNTER — Ambulatory Visit (INDEPENDENT_AMBULATORY_CARE_PROVIDER_SITE_OTHER): Payer: 59 | Admitting: Family Medicine

## 2011-10-12 VITALS — BP 120/71 | HR 60 | Wt 215.0 lb

## 2011-10-12 DIAGNOSIS — L255 Unspecified contact dermatitis due to plants, except food: Secondary | ICD-10-CM

## 2011-10-12 DIAGNOSIS — Z23 Encounter for immunization: Secondary | ICD-10-CM

## 2011-10-12 DIAGNOSIS — L237 Allergic contact dermatitis due to plants, except food: Secondary | ICD-10-CM

## 2011-10-12 MED ORDER — TRIAMCINOLONE ACETONIDE 40 MG/ML IJ SUSP: 40.0000 mg | Freq: Once | INTRAMUSCULAR | Status: DC

## 2011-10-12 NOTE — Progress Notes (Signed)
  Subjective:    Patient ID: Patricia Waters, female    DOB: 07/24/1955, 56 y.o.   MRN: [id number]  HPI Poison ivy-she was out in the yard working over the weekend and started developing blistery rash on both wrists in a couple of new lesions on her lower arms have shown up in the last day or 2. She's been using calamine lotion to try to soothe it. She's been wrapping at night so that she won't scratch it and letting it dry out during the day. No other topical treatments.   Review of Systems     Objective:   Physical Exam  On both forearms she has a erythematous vesicular rash. She also has a few smaller erythematous papules scattered over both forearms.      Assessment & Plan:  Poison Ivey-we discussed washing the contacted closing items in hot water to avoid the plant oil. Avoiding scratching as much as possible. She can continue the calamine lotion as needed. Given IM triamcinolone 40 mg today for relief. Call if she's having any problems or if not resolving.  Flu vaccine given today.

## 2011-10-12 NOTE — Patient Instructions (Signed)
Poison Ivy Poison ivy is a inflammation of the skin (contact dermatitis) caused by touching the allergens on the leaves of the ivy plant following previous exposure to the plant. The rash usually appears 48 hours after exposure. The rash is usually bumps (papules) or blisters (vesicles) in a linear pattern. Depending on your own sensitivity, the rash may simply cause redness and itching, or it may also progress to blisters which may break open. These must be well cared for to prevent secondary bacterial (germ) infection, followed by scarring. Keep any open areas dry, clean, dressed, and covered with an antibacterial ointment if needed. The eyes may also get puffy. The puffiness is worst in the morning and gets better as the day progresses. This dermatitis usually heals without scarring, within 2 to 3 weeks without treatment. HOME CARE INSTRUCTIONS  Thoroughly wash with soap and water as soon as you have been exposed to poison ivy. You have about one half hour to remove the plant resin before it will cause the rash. This washing will destroy the oil or antigen on the skin that is causing, or will cause, the rash. Be sure to wash under your fingernails as any plant resin there will continue to spread the rash. Do not rub skin vigorously when washing affected area. Poison ivy cannot spread if no oil from the plant remains on your body. A rash that has progressed to weeping sores will not spread the rash unless you have not washed thoroughly. It is also important to wash any clothes you have been wearing as these may carry active allergens. The rash will return if you wear the unwashed clothing, even several days later. Avoidance of the plant in the future is the best measure. Poison ivy plant can be recognized by the number of leaves. Generally, poison ivy has three leaves with flowering branches on a single stem. Diphenhydramine may be purchased over the counter and used as needed for itching. Do not drive with  this medication if it makes you drowsy.Ask your caregiver about medication for children. SEEK MEDICAL CARE IF:  Open sores develop.   Redness spreads beyond area of rash.   You notice purulent (pus-like) discharge.   You have increased pain.   Other signs of infection develop (such as fever).  Document Released: 01/14/2000 Document Revised: 01/05/2011 Document Reviewed: 12/02/2008 [employer] Patient Information 2012 [employer]. 

## 2011-10-13 ENCOUNTER — Telehealth: Payer: Self-pay | Admitting: *Deleted

## 2011-10-13 MED ORDER — TRIAMCINOLONE ACETONIDE 0.5 % EX OINT: TOPICAL_OINTMENT | Freq: Two times a day (BID) | CUTANEOUS | Status: AC

## 2011-10-13 NOTE — Telephone Encounter (Signed)
rx sent

## 2011-10-13 NOTE — Telephone Encounter (Signed)
Pt called and states she now has blisters and would like to have a rx called in and wants to know if she can pop the blisters.Per Dr. Linford Arnold  Will call in triamcinalone and dont pop blisters

## 2011-10-18 ENCOUNTER — Telehealth: Payer: Self-pay | Admitting: *Deleted

## 2011-10-18 NOTE — Telephone Encounter (Signed)
Pt has called stating that she is at [facility] and states her friend told her that what she has looks just like what friend's sister has had which was called catepillar dermititis and she states the blisters have black dots on them. She states that if this could be it and you need to call something in for her to send it to the [facility] on [address].

## 2011-10-18 NOTE — Telephone Encounter (Signed)
Use tape to get the Hairlets out (black dots). Then use calamine lotion and can take benadryl by mouth.  It is usually just a contact dermatitis (local skin reaction).

## 2011-10-19 NOTE — Telephone Encounter (Signed)
Pt informed

## 2011-10-31 ENCOUNTER — Telehealth: Payer: Self-pay | Admitting: *Deleted

## 2011-10-31 MED ORDER — DULOXETINE HCL 30 MG PO CPEP: 30.0000 mg | ORAL_CAPSULE | Freq: Every day | ORAL | Status: DC

## 2011-11-13 ENCOUNTER — Telehealth: Payer: Self-pay | Admitting: *Deleted

## 2011-11-13 NOTE — Telephone Encounter (Signed)
C/o poison Ivy and Caterpillar Dermatitis and wants something called into pharm

## 2011-11-13 NOTE — Telephone Encounter (Signed)
Does she still want it? Or is it better?

## 2011-11-13 NOTE — Telephone Encounter (Signed)
Yes she still has and would like something for it.

## 2011-11-14 ENCOUNTER — Encounter: Payer: Self-pay | Admitting: Family Medicine

## 2011-11-14 ENCOUNTER — Ambulatory Visit (INDEPENDENT_AMBULATORY_CARE_PROVIDER_SITE_OTHER): Payer: 59 | Admitting: Family Medicine

## 2011-11-14 VITALS — BP 132/89 | HR 83 | Wt 211.0 lb

## 2011-11-14 DIAGNOSIS — L255 Unspecified contact dermatitis due to plants, except food: Secondary | ICD-10-CM

## 2011-11-14 MED ORDER — PREDNISONE 20 MG PO TABS: ORAL_TABLET | ORAL | Status: AC

## 2011-11-14 NOTE — Progress Notes (Signed)
CC: Patricia Waters is a 56 y.o. female is here for Poison Ivy   Subjective: HPI:  Patient reports a rash that was initially noticed in early September after exposure to poison ivy which resolved after what sounds like a Depo Medrol injection and daily triamcinolone topical applications.  About a week ago she was working with tools that she originally used to kill poison ivy and is now broken out with her typical reaction to poison ivy.  She complains of itching but no other discomfort. She has outbreaks on the volar wrists, on almost all fingers, on the abdomen, and even around the base of her neck. She denies fevers, chills, flushing, irregular heartbeat, GI disturbance, shortness of breath, nor wheezing. She's been using triamcinolone cream and Benadryl for the itching but no other interventions.  Review Of Systems Outlined In HPI  Past Medical History  Diagnosis Date  . Vertigo 2007-2009     Family History  Problem Relation Age of Onset  . Diabetes Father   . Diabetes Paternal Grandmother   . Stroke Mother   . Hyperlipidemia Mother   . Hyperlipidemia Father   . Hyperlipidemia Sister   . Heart attack Father 54  . Heart attack Paternal Grandfather   . Heart attack Paternal Grandmother   . Heart attack Maternal Grandfather   . Heart attack Maternal Grandmother   . Breast cancer Sister   . Lymphoma Maternal Aunt   . Colon cancer Father   . Skin cancer Mother   . Skin cancer Maternal Grandmother   . Kidney cancer Maternal Aunt   . Rheum arthritis Maternal Grandmother   . Rheum arthritis Paternal Grandmother      History  Substance Use Topics  . Smoking status: Former Smoker    Types: Cigarettes    Quit date: 10/31/2006  . Smokeless tobacco: Not on file  . Alcohol Use: Not on file     Objective: Filed Vitals:   11/14/11 1019  BP: 132/89  Pulse: 83    General: Alert and Oriented, No Acute Distress HEENT: Pupils equal, round, reactive to light. Conjunctivae clear.  Moist mucous membranes, pharynx without inflammation nor lesions.  Lungs: Clear to auscultation bilaterally, no wheezing/ronchi/rales.  Comfortable work of breathing. Good air movement. Cardiac: Regular rate and rhythm. Normal S1/S2.  No murmurs, rubs, nor gallops.   Mental Status: No depression, anxiety, nor agitation. Skin: Collection of vesicular lesions with clear fluid and mildly erythematous base that are in circular and linear groups involving the volar wrists, mildly scattered on most of her fingers, and involving the abdomen.  Assessment & Plan: Patricia Waters was seen today for poison ivy.  Diagnoses and associated orders for this visit:  Rhus dermatitis - predniSONE (DELTASONE) 20 MG tablet; Three tabs daily days 1-3, two tabs daily days 4-6, one tab daily days 7-9, half tab daily days 10-13.    She'll continue on topical triamcinolone that she aren't has a home, will start prednisone as she's had a favorable response with systemic steroids in the past, she declined Atarax as the itching is well-controlled with Benadryl. We went over the need to either destroy or thorough clean the tools she was using that caused this outbreak.  Return if symptoms worsen or fail to improve.

## 2011-12-05 ENCOUNTER — Telehealth: Payer: Self-pay | Admitting: Family Medicine

## 2011-12-05 DIAGNOSIS — R7309 Other abnormal glucose: Secondary | ICD-10-CM

## 2011-12-05 DIAGNOSIS — Z13220 Encounter for screening for lipoid disorders: Secondary | ICD-10-CM

## 2011-12-05 NOTE — Telephone Encounter (Signed)
OK for CMP, lipids, A1C. Dx abnormal glucose. Screen lipid. If coming for CPE then can use V70.0.

## 2011-12-05 NOTE — Telephone Encounter (Signed)
Patient called and states that she has a f/u appt with Dr. Linford Arnold coming up and she needs to get her labs drawn before that appt. Please fax the lab order to lab downstairs so she can get them drawn on 11-6 OR 11-7.Marland Kitchen Thanks, [employer]

## 2011-12-05 NOTE — Telephone Encounter (Signed)
Dr. Linford Arnold what labs do you want? CMP, Lipid and diagnosis

## 2011-12-06 NOTE — Telephone Encounter (Signed)
Labs entered and faxed to [facility]

## 2011-12-07 LAB — COMPLETE METABOLIC PANEL WITH GFR
AST: 20 U/L (ref 0–37)
Alkaline Phosphatase: 98 U/L (ref 39–117)
BUN: 10 mg/dL (ref 6–23)
Calcium: 10.1 mg/dL (ref 8.4–10.5)
Creat: 0.79 mg/dL (ref 0.50–1.10)
GFR, Est Non African American: 85 mL/min
Glucose, Bld: 89 mg/dL (ref 70–99)

## 2011-12-07 LAB — HEMOGLOBIN A1C: Hgb A1c MFr Bld: 5.7 % — ABNORMAL HIGH (ref <5.7)

## 2011-12-07 LAB — LIPID PANEL
HDL: 60 mg/dL (ref >39)
Total CHOL/HDL Ratio: 3.3 Ratio
Triglycerides: 121 mg/dL (ref <150)

## 2011-12-14 ENCOUNTER — Ambulatory Visit (INDEPENDENT_AMBULATORY_CARE_PROVIDER_SITE_OTHER): Payer: 59 | Admitting: Family Medicine

## 2011-12-14 ENCOUNTER — Encounter: Payer: Self-pay | Admitting: Family Medicine

## 2011-12-14 VITALS — BP 115/75 | HR 65 | Ht 66.0 in | Wt 209.0 lb

## 2011-12-14 DIAGNOSIS — R7301 Impaired fasting glucose: Secondary | ICD-10-CM | POA: Insufficient documentation

## 2011-12-14 DIAGNOSIS — E7800 Pure hypercholesterolemia, unspecified: Secondary | ICD-10-CM

## 2011-12-14 DIAGNOSIS — M48061 Spinal stenosis, lumbar region without neurogenic claudication: Secondary | ICD-10-CM

## 2011-12-14 NOTE — Progress Notes (Signed)
  Subjective:    Patient ID: Patricia Waters, female    DOB: 14-Feb-1955, 56 y.o.   MRN: [id number]  HPI She has lost 3 more lbs.  Back pain is stable. Doing on the cymbalta.  Some dry mouth on the medication. No other S.E. . Taking neurontin as well.   Also here to f/u labs and discuss results.  She has been gong to the gym more nad has lost 3 lbs.     Review of Systems     Objective:   Physical Exam  Constitutional: She is oriented to person, place, and time. She appears well-developed and well-nourished.  HENT:  Head: Normocephalic and atraumatic.  Cardiovascular: Normal rate, regular rhythm and normal heart sounds.   Pulmonary/Chest: Effort normal and breath sounds normal.  Neurological: She is alert and oriented to person, place, and time.  Skin: Skin is warm and dry.  Psychiatric: She has a normal mood and affect. Her behavior is normal.          Assessment & Plan:  Chronic back pain-she's able her current regimen of Cymbalta and Neurontin. No changes made today. Followup in 6 months.  Impaired fasting glucose-we'll recheck her hemoglobin A1c in 6 months. We discussed working on decreasing concentrated sweets and watching her portion sizes on her carbohydrates.  Elevated cholesterol-we did discuss working on continuing with exercise diet and weight loss. It is up a little bit from a sure that it. The good news is his her HDL has actually increased. Recheck in one year.

## 2012-02-06 ENCOUNTER — Other Ambulatory Visit: Payer: Self-pay | Admitting: *Deleted

## 2012-02-06 MED ORDER — GABAPENTIN 300 MG PO CAPS: 600.0000 mg | ORAL_CAPSULE | Freq: Every day | ORAL | Status: DC

## 2012-06-05 ENCOUNTER — Ambulatory Visit (INDEPENDENT_AMBULATORY_CARE_PROVIDER_SITE_OTHER): Payer: 59 | Admitting: Family Medicine

## 2012-06-05 ENCOUNTER — Encounter: Payer: Self-pay | Admitting: Family Medicine

## 2012-06-05 VITALS — BP 116/73 | HR 71 | Ht 66.0 in | Wt 220.0 lb

## 2012-06-05 DIAGNOSIS — R7301 Impaired fasting glucose: Secondary | ICD-10-CM

## 2012-06-05 DIAGNOSIS — M545 Low back pain, unspecified: Secondary | ICD-10-CM

## 2012-06-05 DIAGNOSIS — G8929 Other chronic pain: Secondary | ICD-10-CM

## 2012-06-05 MED ORDER — CYCLOBENZAPRINE HCL 10 MG PO TABS: 10.0000 mg | ORAL_TABLET | Freq: Every evening | ORAL | Status: DC | PRN

## 2012-06-05 NOTE — Progress Notes (Signed)
  Subjective:    Patient ID: Patricia Waters, female    DOB: 07-09-1955, 57 y.o.   MRN: [id number]  HPI Back Pain - no regular exercise.  Her weight is up 11 lbs.  She is happy with current regimen. Says her back pain flares when she mows the yard.  Using heating pad and then ice.   IFG - Dong well.  Diet is about the same.     Review of Systems     Objective:   Physical Exam  Constitutional: She is oriented to person, place, and time. She appears well-developed and well-nourished.  HENT:  Head: Normocephalic and atraumatic.  Cardiovascular: Normal rate, regular rhythm and normal heart sounds.   Pulmonary/Chest: Effort normal and breath sounds normal.  Musculoskeletal:  , Tender of the lumbar spine and lumbar paraspinous muscles. Negative straight leg raise bilaterally. Hip, knee, ankle strength is 5 out of 5 bilaterally. Patellar reflexes are 2+ bilaterally.  Neurological: She is alert and oriented to person, place, and time.  Skin: Skin is warm and dry.  Psychiatric: She has a normal mood and affect. Her behavior is normal.          Assessment & Plan:  Back Pain - Will add a prn muscle relaxer to her regimen.  She is going to be doing a lot of driving recently. Strongly encouraged her to get back on her exercise routine I think it has made a big difference in her blood sugars and she really needs to get the 11 pounds off that she has gained since I last saw her 6 months ago. She says she will work on it. This should also help with her back pain by getting extra weight off.  IFG- Well controlled. Recheck in 6-12 months.  Continue to work on getting any regular exercise and eating a healthy diet.  Lab Results  Component Value Date   HGBA1C 5.5 06/05/2012

## 2012-06-12 ENCOUNTER — Ambulatory Visit: Payer: 59 | Admitting: Family Medicine

## 2012-07-15 ENCOUNTER — Telehealth: Payer: Self-pay | Admitting: Physician Assistant

## 2012-07-15 MED ORDER — DULOXETINE HCL 30 MG PO CPEP: 30.0000 mg | ORAL_CAPSULE | Freq: Every day | ORAL | Status: DC

## 2012-07-15 MED ORDER — GABAPENTIN 300 MG PO CAPS: 600.0000 mg | ORAL_CAPSULE | Freq: Every day | ORAL | Status: DC

## 2012-07-15 NOTE — Telephone Encounter (Signed)
Pt called for refill on cymbalta and gapapentin. Refilled with no refills.

## 2012-07-18 ENCOUNTER — Other Ambulatory Visit: Payer: Self-pay | Admitting: *Deleted

## 2012-07-18 MED ORDER — GABAPENTIN 300 MG PO CAPS: 600.0000 mg | ORAL_CAPSULE | Freq: Every day | ORAL | Status: DC

## 2012-07-18 MED ORDER — DULOXETINE HCL 30 MG PO CPEP: 30.0000 mg | ORAL_CAPSULE | Freq: Every day | ORAL | Status: DC

## 2012-10-16 ENCOUNTER — Other Ambulatory Visit: Payer: Self-pay | Admitting: Family Medicine

## 2012-11-08 ENCOUNTER — Ambulatory Visit (INDEPENDENT_AMBULATORY_CARE_PROVIDER_SITE_OTHER): Payer: 59 | Admitting: Family Medicine

## 2012-11-08 DIAGNOSIS — Z23 Encounter for immunization: Secondary | ICD-10-CM

## 2012-11-08 NOTE — Progress Notes (Signed)
  Subjective:    Patient ID: Patricia Waters, female    DOB: [birth year], 56 y.o.   MRN: [id number]  HPI  Patient received a flu vaccine in the left deltoid. Patient tolerated well.   Review of Systems     Objective:   Physical Exam        Assessment & Plan:

## 2012-12-06 ENCOUNTER — Ambulatory Visit (INDEPENDENT_AMBULATORY_CARE_PROVIDER_SITE_OTHER): Payer: 59 | Admitting: Family Medicine

## 2012-12-06 ENCOUNTER — Encounter: Payer: Self-pay | Admitting: Family Medicine

## 2012-12-06 ENCOUNTER — Other Ambulatory Visit: Payer: Self-pay | Admitting: Family Medicine

## 2012-12-06 VITALS — BP 126/82 | Wt 219.0 lb

## 2012-12-06 DIAGNOSIS — M48061 Spinal stenosis, lumbar region without neurogenic claudication: Secondary | ICD-10-CM

## 2012-12-06 DIAGNOSIS — R7301 Impaired fasting glucose: Secondary | ICD-10-CM

## 2012-12-06 DIAGNOSIS — R0683 Snoring: Secondary | ICD-10-CM

## 2012-12-06 DIAGNOSIS — M549 Dorsalgia, unspecified: Secondary | ICD-10-CM

## 2012-12-06 DIAGNOSIS — R0609 Other forms of dyspnea: Secondary | ICD-10-CM

## 2012-12-06 DIAGNOSIS — R5383 Other fatigue: Secondary | ICD-10-CM

## 2012-12-06 DIAGNOSIS — R5381 Other malaise: Secondary | ICD-10-CM

## 2012-12-06 LAB — CBC WITH DIFFERENTIAL/PLATELET
Basophils Absolute: 0 10*3/uL (ref 0.0–0.1)
Eosinophils Relative: 1 % (ref 0–5)
HCT: 40.8 % (ref 36.0–46.0)
Hemoglobin: 13.9 g/dL (ref 12.0–15.0)
Lymphocytes Relative: 32 % (ref 12–46)
Lymphs Abs: 2.2 10*3/uL (ref 0.7–4.0)
MCV: 91.7 fL (ref 78.0–100.0)
Monocytes Absolute: 0.5 10*3/uL (ref 0.1–1.0)
Monocytes Relative: 7 % (ref 3–12)
Neutro Abs: 4.3 10*3/uL (ref 1.7–7.7)
RBC: 4.45 MIL/uL (ref 3.87–5.11)
RDW: 12.3 % (ref 11.5–15.5)
WBC: 7.1 10*3/uL (ref 4.0–10.5)

## 2012-12-06 LAB — POCT GLYCOSYLATED HEMOGLOBIN (HGB A1C): Hemoglobin A1C: 5.1

## 2012-12-06 MED ORDER — GABAPENTIN 300 MG PO CAPS: 600.0000 mg | ORAL_CAPSULE | Freq: Every day | ORAL | Status: DC

## 2012-12-06 MED ORDER — DULOXETINE HCL 30 MG PO CPEP: ORAL_CAPSULE | ORAL | Status: DC

## 2012-12-06 NOTE — Progress Notes (Signed)
Subjective:    Patient ID: Patricia Waters, female    DOB: 1955/12/13, 57 y.o.   MRN: [id number]  HPI Chronic Back pain - She needs refills on cymbalta and her gabapentin. She is doing well on the generic cymbalta.  She's not having any side effects with her medications.  IFG- No inc thirst, but maybe some inc urination, but says she also drinks a lot of fluid during the day..    colonoscpoy scheduled on 12/20/12.    Yawns excessively.  Does get up 1-2 times to urinate at night.  She snores some.  Gets 8 hours of sleep.  Says wakes up well rested.  Her partner says that she snores loudly and has even hurt her hold her breath while sleeping. She's never been tested for sleep apnea. Her BMI is 35.  Review of Systems     BP 126/82  Wt 219 lb (99.338 kg)    No Known Allergies  Past Medical History  Diagnosis Date  . Vertigo 2007-2009    Past Surgical History  Procedure Laterality Date  . Breast lumpectomy  1994    right  . Cervical fusion  1995    C5-C7  . Mastectomy  2005    Bilat    History   Social History  . Marital Status: Single    Spouse Name: N/A    Number of Children: N/A  . Years of Education: N/A   Occupational History  . Administrator, [employer] Service   Social History Main Topics  . Smoking status: Former Smoker    Types: Cigarettes    Quit date: 10/31/2006  . Smokeless tobacco: Not on file  . Alcohol Use: Not on file  . Drug Use: No  . Sexual Activity: Yes    Partners: Female   Other Topics Concern  . Not on file   Social History Narrative   Lives with her partner. Former Psychologist, forensic.     Family History  Problem Relation Age of Onset  . Diabetes Father   . Diabetes Paternal Grandmother   . Stroke Mother   . Hyperlipidemia Mother   . Hyperlipidemia Father   . Hyperlipidemia Sister   . Heart attack Father 61  . Heart attack Paternal Grandfather   . Heart attack Paternal Grandmother   . Heart attack Maternal Grandfather   . Heart attack  Maternal Grandmother   . Breast cancer Sister   . Lymphoma Maternal Aunt   . Colon cancer Father   . Skin cancer Mother   . Skin cancer Maternal Grandmother   . Kidney cancer Maternal Aunt   . Rheum arthritis Maternal Grandmother   . Rheum arthritis Paternal Grandmother     Outpatient Encounter Prescriptions as of 12/06/2012  Medication Sig  . Black Cohosh 200 MG CAPS Take 1 capsule by mouth daily.    . calcium carbonate (OS-CAL) 600 MG TABS Take 600 mg by mouth daily.    . Cetirizine-Pseudoephedrine (ZYRTEC-D PO) Take by mouth.  . cholecalciferol (VITAMIN D) 1000 UNITS tablet Take 1,000 Units by mouth daily.    . cyclobenzaprine (FLEXERIL) 10 MG tablet Take 1 tablet (10 mg total) by mouth at bedtime as needed for muscle spasms.  . DULoxetine (CYMBALTA) 30 MG capsule Take 1 capsule by mouth  daily  . gabapentin (NEURONTIN) 300 MG capsule Take 2 capsules (600 mg total) by mouth daily.  Boris Lown Oil 1000 MG CAPS Take by mouth.  . Multiple Vitamin (MULITIVITAMIN WITH MINERALS)  TABS Take 1 tablet by mouth daily.    . Multiple Vitamins-Minerals (PRESERVISION AREDS 2 PO) Take 1 tablet by mouth daily.  . psyllium (METAMUCIL) 0.52 G capsule Take 0.52 g by mouth daily.    . vitamin C (ASCORBIC ACID) 500 MG tablet Take 500 mg by mouth daily.    . [DISCONTINUED] DULoxetine (CYMBALTA) 30 MG capsule Take 1 capsule by mouth  daily  . [DISCONTINUED] gabapentin (NEURONTIN) 300 MG capsule Take 2 capsules (600 mg total) by mouth daily.  . [DISCONTINUED] Omega-3 Fatty Acids (FISH OIL) 1000 MG CAPS Take by mouth.       Objective:   Physical Exam  Constitutional: She is oriented to person, place, and time. She appears well-developed and well-nourished.  HENT:  Head: Normocephalic and atraumatic.  Cardiovascular: Normal rate, regular rhythm and normal heart sounds.   Pulmonary/Chest: Effort normal and breath sounds normal.  Neurological: She is alert and oriented to person, place, and time.  Skin:  Skin is warm and dry.  Psychiatric: She has a normal mood and affect. Her behavior is normal.          Assessment & Plan:  Chronic back pain/spinal stenosis - doing well on current regimen.  Needs 90 days supply.    IFG - Well controlled. It looks absolutely fantastic. She's been a great job with diet. Still encouraged her work on weight loss and getting some regular exercise. F/u in 1 year.   Lab Results  Component Value Date   HGBA1C 5.1 12/06/2012   Snoring-she did screen positive on the stop bang questionnaire . Positive score of 4. Will refer for sleep study at [facility]. we'll also check for thyroid disorder and anemia.

## 2012-12-06 NOTE — Progress Notes (Signed)
Quick Note:  All labs are normal. ______ 

## 2012-12-20 ENCOUNTER — Other Ambulatory Visit: Payer: Self-pay | Admitting: Gastroenterology

## 2013-01-27 ENCOUNTER — Other Ambulatory Visit: Payer: Self-pay | Admitting: *Deleted

## 2013-01-27 MED ORDER — GABAPENTIN 300 MG PO CAPS: 600.0000 mg | ORAL_CAPSULE | Freq: Every day | ORAL | Status: DC

## 2013-02-17 ENCOUNTER — Ambulatory Visit (HOSPITAL_BASED_OUTPATIENT_CLINIC_OR_DEPARTMENT_OTHER): Payer: 59 | Attending: Family Medicine

## 2013-02-17 VITALS — Ht 66.0 in | Wt 225.0 lb

## 2013-02-17 DIAGNOSIS — Z6836 Body mass index (BMI) 36.0-36.9, adult: Secondary | ICD-10-CM | POA: Insufficient documentation

## 2013-02-17 DIAGNOSIS — G4761 Periodic limb movement disorder: Secondary | ICD-10-CM | POA: Insufficient documentation

## 2013-02-17 DIAGNOSIS — R0683 Snoring: Secondary | ICD-10-CM

## 2013-02-17 DIAGNOSIS — G4733 Obstructive sleep apnea (adult) (pediatric): Secondary | ICD-10-CM | POA: Insufficient documentation

## 2013-02-17 DIAGNOSIS — R0989 Other specified symptoms and signs involving the circulatory and respiratory systems: Secondary | ICD-10-CM | POA: Insufficient documentation

## 2013-02-17 DIAGNOSIS — R0609 Other forms of dyspnea: Secondary | ICD-10-CM | POA: Insufficient documentation

## 2013-02-22 DIAGNOSIS — R0989 Other specified symptoms and signs involving the circulatory and respiratory systems: Secondary | ICD-10-CM

## 2013-02-22 DIAGNOSIS — R0609 Other forms of dyspnea: Secondary | ICD-10-CM

## 2013-02-22 NOTE — Sleep Study (Signed)
   NAME: Patricia Waters DATE OF BIRTH:  Oct 19, 1955 MEDICAL RECORD NUMBER [id number]  LOCATION: [facility]  PHYSICIAN: YOUNG,CLINTON D  DATE OF STUDY: 02/17/2013  SLEEP STUDY TYPE: Nocturnal Polysomnogram               REFERRING PHYSICIAN: Beatrice Lecher D, *  INDICATION FOR STUDY: Hypersomnia with sleep apnea  EPWORTH SLEEPINESS SCORE:   1/24 HEIGHT: 5\' 6"  (167.6 cm)  WEIGHT: 225 lb (102.059 kg)    Body mass index is 36.33 kg/(m^2).  NECK SIZE: 16 in.  MEDICATIONS: Charted for review  SLEEP ARCHITECTURE: Total sleep time 353 minutes with sleep efficiency 81.2%. Stage I was 4.8%, stage II 73.7%, stage III 6.9%, REM 14.6% of total sleep time. Sleep latency 75 minutes, REM latency 185 minutes, awake after sleep onset 6.5 minutes, arousal index 17.7. Bedtime medication: Gabapentin  RESPIRATORY DATA: Apnea hypopnea index (AHI) 8.7 per hour. 51 events were scored including 3 obstructive apneas, 7 central apneas, 2 mixed apneas, 39 hypopneas. Events were associated with supine sleep. There were not enough early events to permit application of split protocol CPAP titration.  OXYGEN DATA: Moderately loud snoring with oxygen desaturation to a nadir of 83% and mean oxygen saturation through the study of 93.5% on room air.  CARDIAC DATA: Sinus rhythm with PACs  MOVEMENT/PARASOMNIA: A total of 410 limb jerks were counted of which 60 were associated with arousals or awakenings for periodic limb movement with arousal index of 10.2 per hour. 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 REM AHI of 40.8 per hour. Moderately loud snoring with oxygen desaturation to a nadir of 83% and mean oxygen saturation through the study of 93.5% on room air. 2) Periodic limb movement syndrome with arousal. A total of 410 limb jerks were counted of which 60 were associated with arousals or  awakenings for periodic limb movement with arousal index of 10.2 per hour. Consider a trial of specific therapy such as Requip or Mirapex if appropriate.  3) This degree of sleep apnea may respond to conservative measures including weight loss, treatment for nasal congestion, sleeping off flat of back. Otherwise CPAP or an oral appliance might prove useful.  Signed Baird Lyons M.D. Deneise Lever Diplomate, American Board of Sleep Medicine  ELECTRONICALLY SIGNED ON:  02/22/2013, 11:55 AM [place] PH: [phone]   FX: [phone] [place]

## 2013-03-03 ENCOUNTER — Telehealth: Payer: Self-pay | Admitting: Family Medicine

## 2013-03-03 NOTE — Telephone Encounter (Signed)
Please call pt and let her know results have come in for her sleep study. Please schedule appt to review results.

## 2013-03-04 NOTE — Telephone Encounter (Signed)
lvm informing pt to call and schedule an appt.Patricia Waters

## 2013-03-06 ENCOUNTER — Ambulatory Visit: Payer: 59 | Admitting: Family Medicine

## 2013-03-12 ENCOUNTER — Ambulatory Visit: Payer: 59 | Admitting: Family Medicine

## 2013-03-19 ENCOUNTER — Encounter: Payer: Self-pay | Admitting: Physician Assistant

## 2013-03-19 ENCOUNTER — Ambulatory Visit: Payer: 59 | Admitting: Family Medicine

## 2013-03-19 ENCOUNTER — Telehealth: Payer: Self-pay | Admitting: *Deleted

## 2013-03-19 ENCOUNTER — Ambulatory Visit (INDEPENDENT_AMBULATORY_CARE_PROVIDER_SITE_OTHER): Payer: 59 | Admitting: Physician Assistant

## 2013-03-19 VITALS — BP 117/71 | HR 77 | Wt 231.0 lb

## 2013-03-19 DIAGNOSIS — E669 Obesity, unspecified: Secondary | ICD-10-CM | POA: Insufficient documentation

## 2013-03-19 DIAGNOSIS — G2581 Restless legs syndrome: Secondary | ICD-10-CM | POA: Insufficient documentation

## 2013-03-19 DIAGNOSIS — G4733 Obstructive sleep apnea (adult) (pediatric): Secondary | ICD-10-CM | POA: Insufficient documentation

## 2013-03-19 DIAGNOSIS — G4731 Primary central sleep apnea: Secondary | ICD-10-CM | POA: Insufficient documentation

## 2013-03-19 MED ORDER — ROPINIROLE HCL 0.25 MG PO TABS: ORAL_TABLET | ORAL | Status: DC

## 2013-03-19 MED ORDER — FLUTICASONE PROPIONATE 50 MCG/ACT NA SUSP: 2.0000 | Freq: Every day | NASAL | Status: DC

## 2013-03-19 NOTE — Progress Notes (Signed)
   Subjective:    Patient ID: Patricia Waters, female    DOB: 08-Dec-1955, 57 y.o.   MRN: [id number] Pt's neck measurement is 15.5 inches.  Measurement was for sleep study. Beatris Ship, CMA HPI    Review of Systems     Objective:   Physical Exam        Assessment & Plan:

## 2013-03-19 NOTE — Telephone Encounter (Signed)
Can we try [facility] for this pt?

## 2013-03-19 NOTE — Patient Instructions (Signed)
Sleep Apnea  Sleep apnea is disorder that affects a person's sleep. A person with sleep apnea has abnormal pauses in their breathing when they sleep. It is hard for them to get a good sleep. This makes a person tired during the day. It also can lead to other physical problems. There are three types of sleep apnea. One type is when breathing stops for a short time because your airway is blocked (obstructive sleep apnea). Another type is when the brain sometimes fails to give the normal signal to breathe to the muscles that control your breathing (central sleep apnea). The third type is a combination of the other two types.  HOME CARE  · Do not sleep on your back. Try to sleep on your side.  · Take all medicine as told by your doctor.  · Avoid alcohol, calming medicines (sedatives), and depressant drugs.  · Try to lose weight if you are overweight. Talk to your doctor about a healthy weight goal.  Your doctor may have you use a device that helps to open your airway. It can help you get the air that you need. It is called a positive airway pressure (PAP) device. There are three types of PAP devices:  · Continuous positive airway pressure (CPAP) device.  · Nasal expiratory positive airway pressure (EPAP) device.  · Bilevel positive airway pressure (BPAP) device.  MAKE SURE YOU:  · Understand these instructions.  · Will watch your condition.  · Will get help right away if you are not doing well or get worse.  Document Released: 10/26/2007 Document Revised: 01/03/2012 Document Reviewed: 05/20/2011  ExitCare® Patient Information ©2014 [employer].

## 2013-03-19 NOTE — Progress Notes (Signed)
   Subjective:    Patient ID: Patricia Waters, female    DOB: 1955-07-05, 58 y.o.   MRN: [id number]  HPI Pt is a 58 yo female  who presents to the clinic to discuss sleep study results with her parther.    Review of Systems     Objective:   Physical Exam  Constitutional: She is oriented to person, place, and time. She appears well-developed and well-nourished.  HENT:  Head: Normocephalic and atraumatic.  Neck:  Neck measured 15.5 inches.   Cardiovascular: Normal rate, regular rhythm and normal heart sounds.   Pulmonary/Chest: Effort normal and breath sounds normal.  Neurological: She is alert and oriented to person, place, and time.  Skin: Skin is dry.  Psychiatric: She has a normal mood and affect. Her behavior is normal.          Assessment & Plan:  Mild obstructive/sleep apnea- results below.  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 REM AHI of 40.8 per hour. Moderately loud snoring with oxygen desaturation to a nadir of 83% and mean oxygen saturation through the study of 93.5% on room air.  2) Periodic limb movement syndrome with arousal. A total of 410 limb jerks were counted of which 60 were associated with arousals or awakenings for periodic limb movement with arousal index of 10.2 per hour. Consider a trial of specific therapy such as Requip or Mirapex if appropriate.  3) This degree of sleep apnea may respond to conservative measures including weight loss, treatment for nasal congestion, sleeping off flat of back. Otherwise CPAP or an oral appliance might prove useful.  Pt wants to try conservative measures before getting set up with CPAP. Her partner is a Marine scientist and has a diet and exercise plan designed to lose weight. We set a goal of 15 lbs in the next 3 months. I would like for her to try requip .25mg  nightly may increase by .25mg  weekly until gets to 1mg  or symptoms controlled. Also discussed sleeping on side.  Pt does not drink alcohol. Follow up with metheney in 3 months. If symptoms such as yawning, snoring and fatigue not improved with conservative measures can consider CPAP.

## 2013-04-22 ENCOUNTER — Other Ambulatory Visit: Payer: Self-pay | Admitting: Family Medicine

## 2013-05-13 ENCOUNTER — Ambulatory Visit (INDEPENDENT_AMBULATORY_CARE_PROVIDER_SITE_OTHER): Payer: 59 | Admitting: Obstetrics & Gynecology

## 2013-05-13 ENCOUNTER — Encounter: Payer: Self-pay | Admitting: Obstetrics & Gynecology

## 2013-05-13 VITALS — BP 140/80 | HR 65 | Resp 16 | Ht 66.0 in | Wt 230.0 lb

## 2013-05-13 DIAGNOSIS — N841 Polyp of cervix uteri: Secondary | ICD-10-CM

## 2013-05-13 DIAGNOSIS — Z1151 Encounter for screening for human papillomavirus (HPV): Secondary | ICD-10-CM

## 2013-05-13 DIAGNOSIS — Z01419 Encounter for gynecological examination (general) (routine) without abnormal findings: Secondary | ICD-10-CM

## 2013-05-13 DIAGNOSIS — Z124 Encounter for screening for malignant neoplasm of cervix: Secondary | ICD-10-CM

## 2013-05-13 NOTE — Progress Notes (Signed)
  Subjective:    Patricia Waters is a 58 y.o. female who presents for an annual exam. The patient has no complaints today. The patient is sexually active with same sex partner. GYN screening history: last pap: was normal in 2012. The patient wears seatbelts: yes. The patient participates in regular exercise: some, just started weight loss due to sleep apnea. Has the patient ever been transfused or tattooed?: not asked. The patient reports that there is not domestic violence in her life.  Partner [name] is present. Mother recently diagnosed with endometrial cancer. Sister nad mother with breast cancer (and herself).  BRCA neg.    Menstrual History: OB History   Grav Para Term Preterm Abortions TAB SAB Ect Mult Living                   No LMP recorded. Patient is postmenopausal.    The following portions of the patient's history were reviewed and updated as appropriate: current medications, past family history, past medical history, past social history, past surgical history and problem list.  Review of Systems Pertinent items are noted in HPI.    Objective:      Filed Vitals:   05/13/13 1340  BP: 140/80  Pulse: 65  Resp: 16  Height: $Remove'5\' 6"'wVeBURk$  (1.676 m)  Weight: 230 lb (104.327 kg)   Vitals:  WNL General appearance: alert, cooperative and no distress Head: Normocephalic, without obvious abnormality, atraumatic Eyes: negative Throat: lips, mucosa, and tongue normal; teeth and gums normal Lungs: clear to auscultation bilaterally Breasts: normal appearance, no masses or tenderness, No nipple retraction or dimpling, No nipple discharge or bleeding Heart: regular rate and rhythm Abdomen: soft, non-tender; bowel sounds normal; no masses,  no organomegaly  Pelvic:  External Genitalia:  Tanner V, no lesion Urethra:  Nml, no prolapse Vagina:  Atrophic, no lesion Cervix:  No CMT, small endometrial polyp (see procedure note) Uterus:  Normal size and contour, non tender Adnexa:   Normal, no masses, non tender Rectovaginal Septum:  Non tender, no masses  Extremities: no edema, redness or tenderness in the calves or thighs Skin: no lesions or rash Lymph nodes: Axillary adenopathy: none     .    Assessment:    Healthy female exam.  Endocervical / endometrial polyp extruding from os   Plan:     Thin prep Pap smear. with co testing Self breast exams (history of bilateral mastectomy  Endocervical polyp removed with ring forceps.

## 2013-05-19 ENCOUNTER — Telehealth: Payer: Self-pay | Admitting: *Deleted

## 2013-05-19 NOTE — Telephone Encounter (Signed)
Please see Visit Info comments 

## 2013-05-19 NOTE — Telephone Encounter (Signed)
Called pt to adv PAP was WNL and polyp was benign. Pt expressed understanding.

## 2013-05-19 NOTE — Telephone Encounter (Signed)
Message copied by Gretchen Short on Mon May 19, 2013  3:25 PM ------      Message from: Tarry Kos      Created: Mon May 19, 2013  8:45 AM                   ----- Message -----         From: Guss Bunde, MD         Sent: 05/16/2013   9:33 PM           To: [facility]            Please call pt and tell her she has a benign endocervical polyp.  No further treatment needed. ------

## 2013-06-04 ENCOUNTER — Encounter: Payer: Self-pay | Admitting: Sports Medicine

## 2013-06-04 ENCOUNTER — Ambulatory Visit (INDEPENDENT_AMBULATORY_CARE_PROVIDER_SITE_OTHER): Payer: 59 | Admitting: Sports Medicine

## 2013-06-04 VITALS — BP 124/77 | HR 72 | Ht 66.0 in | Wt 231.0 lb

## 2013-06-04 DIAGNOSIS — M722 Plantar fascial fibromatosis: Secondary | ICD-10-CM | POA: Insufficient documentation

## 2013-06-04 NOTE — Progress Notes (Signed)
    Patient was fitted for a : standard, cushioned, semi-rigid orthotic. The orthotic was heated and afterward the patient stood on the orthotic blank positioned on the orthotic stand. The patient was positioned in subtalar neutral position and 10 degrees of ankle dorsiflexion in a weight bearing stance. After completion of molding, a stable base was applied to the orthotic blank. The blank was ground to a stable position for weight bearing. Size:10 Base: [facility] and Padding: Bilateral scaphoid pads The patient ambulated these, and they were very comfortable.  I spent 40 minutes with this patient, greater than 50% was face-to-face time counseling regarding the below diagnosis.

## 2013-06-04 NOTE — Assessment & Plan Note (Signed)
Custom orthotics with a scaphoid pads. Home rehabilitation. Return in a month, consider injection if no better.

## 2013-06-12 ENCOUNTER — Ambulatory Visit: Payer: 59 | Admitting: Family Medicine

## 2013-06-13 ENCOUNTER — Encounter: Payer: Self-pay | Admitting: Family Medicine

## 2013-06-13 ENCOUNTER — Ambulatory Visit (INDEPENDENT_AMBULATORY_CARE_PROVIDER_SITE_OTHER): Payer: 59 | Admitting: Family Medicine

## 2013-06-13 VITALS — BP 113/72 | HR 68 | Wt 228.0 lb

## 2013-06-13 DIAGNOSIS — G4761 Periodic limb movement disorder: Secondary | ICD-10-CM

## 2013-06-13 DIAGNOSIS — E785 Hyperlipidemia, unspecified: Secondary | ICD-10-CM

## 2013-06-13 DIAGNOSIS — E669 Obesity, unspecified: Secondary | ICD-10-CM

## 2013-06-13 DIAGNOSIS — R7301 Impaired fasting glucose: Secondary | ICD-10-CM

## 2013-06-13 DIAGNOSIS — G4733 Obstructive sleep apnea (adult) (pediatric): Secondary | ICD-10-CM

## 2013-06-13 LAB — LIPID PANEL
Cholesterol: 198 mg/dL (ref 0–200)
HDL: 59 mg/dL (ref >39)
LDL CALC: 113 mg/dL — AB (ref 0–99)
TRIGLYCERIDES: 130 mg/dL (ref <150)
Total CHOL/HDL Ratio: 3.4 Ratio
VLDL: 26 mg/dL (ref 0–40)

## 2013-06-13 LAB — POCT GLYCOSYLATED HEMOGLOBIN (HGB A1C): Hemoglobin A1C: 5.2

## 2013-06-13 NOTE — Progress Notes (Signed)
   Subjective:    Patient ID: Patricia Waters, female    DOB: May 05, 1955, 58 y.o.   MRN: [id number]  HPI IFG - no regular exercise. Starting to exercise.   Periodic limb movement - dong well on the Requip. She's only been taking 0.25 mg nightly. Patient herself says she has never noticed the movements that her partner has.. Partner says RLS is much better on her current regimen.   Followup sleep apnea-she was supposed to work on losing 15 pounds for her followup today. Patricia Waters had gone over with her her sleep study results and they discussed working on diet and weight loss to try to improve her sleep apnea without having to use CPAP. Unfortunately she has not been exercising. Her partner injured her back and she's been helping take care of her. Review of Systems     Objective:   Physical Exam  Constitutional: She is oriented to person, place, and time. She appears well-developed and well-nourished.  HENT:  Head: Normocephalic and atraumatic.  Cardiovascular: Normal rate, regular rhythm and normal heart sounds.   Pulmonary/Chest: Effort normal and breath sounds normal.  Neurological: She is alert and oriented to person, place, and time.  Skin: Skin is warm and dry.  Psychiatric: She has a normal mood and affect. Her behavior is normal.          Assessment & Plan:  IFG - well-controlled on current regimen. Looks fantastic. Recommend repeat A1c in 6-12 months. Still encouraged weight loss and increase in exercise.  Periodic limb movement. -She's doing great on 0.25 mg tab. We'll continue this for now. Reassured her that we do not need to increase the dose especially if this is working well.  Sleep apnea-we discussed the option of considering starting the CPAP which could in turn help with her energy which could then help her lose weight more quickly. Versus just continuing to work on the weight loss on her own. She says she's very motivated and really wants to try hard. I would like to see  her back in 8 weeks and would like her to have lost 10 pounds since then. I recommend the Smart phone application call my fitness PAL to help track her calories in addition to starting a regular exercise routine for 30 minutes at least 5 days per week with moderate to high intensity exercise. If she is needing her weight goals then we can continue down that path and then eventually get her re\re tested for the sleep apnea to see if she is able to correct it was significant weight loss. Otherwise she will need to consider starting CPAP.

## 2013-07-03 ENCOUNTER — Other Ambulatory Visit: Payer: Self-pay | Admitting: *Deleted

## 2013-07-03 MED ORDER — ROPINIROLE HCL 0.25 MG PO TABS: ORAL_TABLET | ORAL | Status: DC

## 2013-07-07 ENCOUNTER — Ambulatory Visit: Payer: 59 | Admitting: Sports Medicine

## 2013-08-03 ENCOUNTER — Other Ambulatory Visit: Payer: Self-pay | Admitting: Family Medicine

## 2013-08-08 ENCOUNTER — Other Ambulatory Visit: Payer: Self-pay | Admitting: Family Medicine

## 2013-08-08 MED ORDER — ROPINIROLE HCL 1 MG PO TABS: 1.0000 mg | ORAL_TABLET | Freq: Every day | ORAL | Status: DC

## 2013-08-12 ENCOUNTER — Ambulatory Visit (INDEPENDENT_AMBULATORY_CARE_PROVIDER_SITE_OTHER): Payer: 59 | Admitting: Family Medicine

## 2013-08-12 ENCOUNTER — Encounter: Payer: Self-pay | Admitting: Family Medicine

## 2013-08-12 ENCOUNTER — Telehealth: Payer: Self-pay | Admitting: Family Medicine

## 2013-08-12 VITALS — BP 118/78 | HR 76 | Ht 66.0 in | Wt 229.0 lb

## 2013-08-12 DIAGNOSIS — G2581 Restless legs syndrome: Secondary | ICD-10-CM

## 2013-08-12 DIAGNOSIS — G4733 Obstructive sleep apnea (adult) (pediatric): Secondary | ICD-10-CM

## 2013-08-12 MED ORDER — AMBULATORY NON FORMULARY MEDICATION: Status: DC

## 2013-08-12 NOTE — Progress Notes (Signed)
   Subjective:    Patient ID: Patricia Waters, female    DOB: April 20, 1955, 58 y.o.   MRN: [id number]  HPI  Sleep apnea-we discussed the option of considering starting the CPAP which could in turn help with her energy which could then help her lose weight more quickly. Versus just continuing to work on the weight loss on her own. She says she's very motivated and really wants to try hard. I would like to see her back in 8 weeks and would like her to have lost 10 pounds since then. I recommend the Smart phone application call my fitness PAL to help track her calories in addition to starting a regular exercise routine for 30 minutes at least 5 days per week with moderate to high intensity exercise. If she is needing her weight goals then we can continue down that path and then eventually get her re\re tested for the sleep apnea to see if she is able to correct it was significant weight loss. Otherwise she will need to consider starting CPAP.  RLS - She wants to increase to 2 tabs for her requip.  She says her partner has noticed increased movement of the legs over the last few weeks. A 0.25 mg doesn't seem to be controlling her symptoms are well.  Review of Systems     Objective:   Physical Exam  Constitutional: She is oriented to person, place, and time. She appears well-developed and well-nourished.  HENT:  Head: Normocephalic and atraumatic.  Eyes: Conjunctivae and EOM are normal.  Cardiovascular: Normal rate.   Pulmonary/Chest: Effort normal.  Neurological: She is alert and oriented to person, place, and time.  Skin: Skin is dry. No pallor.  Psychiatric: She has a normal mood and affect. Her behavior is normal.          Assessment & Plan:  RLS - will try increasing her Requip to 0.5 mg daily and see if this helps. We are sent a prescription for 1 mg to her mail order. It is on hold currently. She can let us know if she wants to fill that or she wants [name] to change the prescription to  read 0.5 mg.  OSA- unfortunately she was not able to lose any weight over the last 8 weeks. She said she did initially start using my fitness PAL but then got off track. She has not been exercising at all. She prefers to use it at home care for CPAP. She has not had a titration study but since her acne is mild but we will just order auto titrate and then we can download this in about a month and then set her CPAP to a specific setting.  Shingles vaccine - handout provided. She will check with her insurance for coverage.

## 2013-08-12 NOTE — Telephone Encounter (Signed)
I faxed Script for Cpap and supplies to [facility] at Fax [phone]

## 2013-08-15 ENCOUNTER — Telehealth: Payer: Self-pay | Admitting: *Deleted

## 2013-08-15 MED ORDER — AMBULATORY NON FORMULARY MEDICATION: Status: DC

## 2013-08-15 NOTE — Telephone Encounter (Signed)
Simona Huh from [facility] called and stated that the order should include with settings 5-20. This was added on and will be faxed.Audelia Hives [place]

## 2013-09-16 ENCOUNTER — Ambulatory Visit: Payer: 59 | Admitting: Family Medicine

## 2013-09-22 ENCOUNTER — Other Ambulatory Visit: Payer: Self-pay | Admitting: *Deleted

## 2013-09-22 MED ORDER — GABAPENTIN 300 MG PO CAPS: ORAL_CAPSULE | ORAL | Status: DC

## 2013-09-25 ENCOUNTER — Encounter: Payer: Self-pay | Admitting: Family Medicine

## 2013-09-25 ENCOUNTER — Telehealth: Payer: Self-pay | Admitting: Family Medicine

## 2013-09-25 ENCOUNTER — Ambulatory Visit (INDEPENDENT_AMBULATORY_CARE_PROVIDER_SITE_OTHER): Payer: 59 | Admitting: Family Medicine

## 2013-09-25 VITALS — BP 100/66 | HR 67 | Ht 66.0 in | Wt 229.0 lb

## 2013-09-25 DIAGNOSIS — E669 Obesity, unspecified: Secondary | ICD-10-CM

## 2013-09-25 DIAGNOSIS — Z6841 Body Mass Index (BMI) 40.0 and over, adult: Secondary | ICD-10-CM | POA: Insufficient documentation

## 2013-09-25 DIAGNOSIS — Z23 Encounter for immunization: Secondary | ICD-10-CM

## 2013-09-25 DIAGNOSIS — G2581 Restless legs syndrome: Secondary | ICD-10-CM

## 2013-09-25 DIAGNOSIS — G4733 Obstructive sleep apnea (adult) (pediatric): Secondary | ICD-10-CM

## 2013-09-25 DIAGNOSIS — Z9989 Dependence on other enabling machines and devices: Secondary | ICD-10-CM

## 2013-09-25 DIAGNOSIS — G4731 Primary central sleep apnea: Secondary | ICD-10-CM

## 2013-09-25 DIAGNOSIS — Z6838 Body mass index (BMI) 38.0-38.9, adult: Secondary | ICD-10-CM | POA: Insufficient documentation

## 2013-09-25 MED ORDER — DULOXETINE HCL 30 MG PO CPEP: ORAL_CAPSULE | ORAL | Status: DC

## 2013-09-25 MED ORDER — GABAPENTIN 300 MG PO CAPS: ORAL_CAPSULE | ORAL | Status: DC

## 2013-09-25 MED ORDER — ROPINIROLE HCL 0.5 MG PO TABS: 0.5000 mg | ORAL_TABLET | Freq: Every day | ORAL | Status: DC

## 2013-09-25 NOTE — Progress Notes (Signed)
   Subjective:    Patient ID: Patricia Waters, female    DOB: 23-Jan-1956, 58 y.o.   MRN: [id number]  HPI She has felt more alert and refreshed when waking up. She has been going to bed earlier. She has been using her CPAP every night. Has nasal pillows and tolerating well.   Has not been exercising.  Says plans on [place] [facility] [place].  Her weight has been stable. Her wife just had surgery so she has been primarily helping to take care of her.  Restless leg syndrome-doing well on Requip. She says that her partner has commented that it has been helpful.  Review of Systems     Objective:   Physical Exam  Constitutional: She is oriented to person, place, and time. She appears well-developed and well-nourished.  HENT:  Head: Normocephalic and atraumatic.  Cardiovascular: Normal rate, regular rhythm and normal heart sounds.   Pulmonary/Chest: Effort normal and breath sounds normal.  Neurological: She is alert and oriented to person, place, and time.  Skin: Skin is warm and dry.  Psychiatric: She has a normal mood and affect. Her behavior is normal.          Assessment & Plan:  OSA on CPAP - so far she's doing very well with the CPAP. We will need to call [facility] to get a download off of her auto Pap so that we can set her CPAP. It sounds like she's been very consistent with it and has actually R. he noticed some improvement in some of her symptoms.  Obesity/BMI 50 - says she is looking into weight watchers.  I encouraged her to do so. This is a great program. She tried my fitness PAL and was consistent with it for a while but then felt like it got overwhelming. She has no heart problems, or history of palpitations. She would be a candidate for phentermine and we discussed this today. She really wants to work on her diet and exercise over the next month. Followup in one month.  Restless leg-continue Requip. Working well.

## 2013-09-25 NOTE — Telephone Encounter (Signed)
[facility]'s call [facility] and have them fax a download off her auto titration for her CPAP so that we can set her CPAP.

## 2013-09-25 NOTE — Patient Instructions (Signed)
Phentermine tablets or capsules What is this medicine? PHENTERMINE (FEN ter meen) decreases your appetite. It is used with a reduced calorie diet and exercise to help you lose weight. This medicine may be used for other purposes; ask your health care provider or pharmacist if you have questions. COMMON BRAND NAME(S): Adipex-P, Atti-Plex P, Atti-Plex P Spansule, Fastin, Pro-Fast, Tara-8 What should I tell my health care provider before I take this medicine? They need to know if you have any of these conditions: -agitation -glaucoma -heart disease -high blood pressure -history of substance abuse -lung disease called Primary Pulmonary Hypertension (PPH) -taken an MAOI like Carbex, Eldepryl, Marplan, Nardil, or Parnate in last 14 days -thyroid disease -an unusual or allergic reaction to phentermine, other medicines, foods, dyes, or preservatives -pregnant or trying to get pregnant -breast-feeding How should I use this medicine? Take this medicine by mouth with a glass of water. Follow the directions on the prescription label. This medicine is usually taken 30 minutes before or 1 to 2 hours after breakfast. Avoid taking this medicine in the evening. It may interfere with sleep. Take your doses at regular intervals. Do not take your medicine more often than directed. Talk to your pediatrician regarding the use of this medicine in children. Special care may be needed. Overdosage: If you think you have taken too much of this medicine contact a poison control center or emergency room at once. NOTE: This medicine is only for you. Do not share this medicine with others. What if I miss a dose? If you miss a dose, take it as soon as you can. If it is almost time for your next dose, take only that dose. Do not take double or extra doses. What may interact with this medicine? Do not take this medicine with any of the following medications: -duloxetine -MAOIs like Carbex, Eldepryl, Marplan, Nardil, and  Parnate -medicines for colds or breathing difficulties like pseudoephedrine or phenylephrine -procarbazine -sibutramine -SSRIs like citalopram, escitalopram, fluoxetine, fluvoxamine, paroxetine, and sertraline -stimulants like dexmethylphenidate, methylphenidate or modafinil -venlafaxine This medicine may also interact with the following medications: -medicines for diabetes This list may not describe all possible interactions. Give your health care provider a list of all the medicines, herbs, non-prescription drugs, or dietary supplements you use. Also tell them if you smoke, drink alcohol, or use illegal drugs. Some items may interact with your medicine. What should I watch for while using this medicine? Notify your physician immediately if you become short of breath while doing your normal activities. Do not take this medicine within 6 hours of bedtime. It can keep you from getting to sleep. Avoid drinks that contain caffeine and try to stick to a regular bedtime every night. This medicine was intended to be used in addition to a healthy diet and exercise. The best results are achieved this way. This medicine is only indicated for short-term use. Eventually your weight loss may level out. At that point, the drug will only help you maintain your new weight. Do not increase or in any way change your dose without consulting your doctor. You may get drowsy or dizzy. Do not drive, use machinery, or do anything that needs mental alertness until you know how this medicine affects you. Do not stand or sit up quickly, especially if you are an older patient. This reduces the risk of dizzy or fainting spells. Alcohol may increase dizziness and drowsiness. Avoid alcoholic drinks. What side effects may I notice from receiving this medicine? Side effects that  you should report to your doctor or health care professional as soon as possible: -chest pain, palpitations -depression or severe changes in  mood -increased blood pressure -irritability -nervousness or restlessness -severe dizziness -shortness of breath -problems urinating -unusual swelling of the legs -vomiting Side effects that usually do not require medical attention (report to your doctor or health care professional if they continue or are bothersome): -blurred vision or other eye problems -changes in sexual ability or desire -constipation or diarrhea -difficulty sleeping -dry mouth or unpleasant taste -headache -nausea This list may not describe all possible side effects. Call your doctor for medical advice about side effects. You may report side effects to FDA at 1-800-FDA-1088. Where should I keep my medicine? Keep out of the reach of children. This medicine can be abused. Keep your medicine in a safe place to protect it from theft. Do not share this medicine with anyone. Selling or giving away this medicine is dangerous and against the law. Store at room temperature between 20 and 25 degrees C (68 and 77 degrees F). Keep container tightly closed. Throw away any unused medicine after the expiration date. NOTE: This sheet is a summary. It may not cover all possible information. If you have questions about this medicine, talk to your doctor, pharmacist, or health care provider.  2015, Elsevier/Gold Standard. (2010-03-02 11:02:44)  

## 2013-09-29 NOTE — Telephone Encounter (Signed)
Faxed note and order to [phone].Patricia Waters [place]

## 2013-09-29 NOTE — Telephone Encounter (Signed)
Call pt: got download report. Call [facility] and set CPAP to 10 cm water pressure

## 2013-10-23 ENCOUNTER — Ambulatory Visit (INDEPENDENT_AMBULATORY_CARE_PROVIDER_SITE_OTHER): Payer: 59 | Admitting: Family Medicine

## 2013-10-23 ENCOUNTER — Encounter: Payer: Self-pay | Admitting: Family Medicine

## 2013-10-23 ENCOUNTER — Other Ambulatory Visit: Payer: Self-pay | Admitting: Family Medicine

## 2013-10-23 VITALS — BP 115/73 | HR 60 | Ht 66.0 in | Wt 229.0 lb

## 2013-10-23 DIAGNOSIS — I890 Lymphedema, not elsewhere classified: Secondary | ICD-10-CM

## 2013-10-23 DIAGNOSIS — Z0000 Encounter for general adult medical examination without abnormal findings: Secondary | ICD-10-CM

## 2013-10-23 NOTE — Assessment & Plan Note (Signed)
Will refer to physical therapy at [facility].

## 2013-10-23 NOTE — Progress Notes (Signed)
Subjective:     Patricia Waters is a 58 y.o. female and is here for a comprehensive physical exam. The patient reports problems - She feels that her lymphedema getting a little bit worse in her right arm. She has had a history of bilateral mastectomy with significant lymph node biopsy on the right. She is to do physical therapy for this and had a compression sleeve. She would like to learn how to do the therapy on her own and would like to be referred back to [facility] and get remeasured for a compression sleeve.Marland Kitchen  History   Social History  . Marital Status: Married    Spouse Name: N/A    Number of Children: N/A  . Years of Education: N/A   Occupational History  . Pharmacist, hospital   Social History Main Topics  . Smoking status: Former Smoker    Types: Cigarettes    Quit date: 10/31/2006  . Smokeless tobacco: Never Used  . Alcohol Use: No  . Drug Use: No  . Sexual Activity: Yes    Partners: Female   Other Topics Concern  . Not on file   Social History Narrative   Lives with her partner. Former Therapist, nutritional.    Health Maintenance  Topic Date Due  . Influenza Vaccine  08/31/2014  . Pap Smear  05/13/2016  . Colonoscopy  01/30/2017  . Tetanus/tdap  10/17/2018    The following portions of the patient's history were reviewed and updated as appropriate: allergies, current medications, past family history, past medical history, past social history, past surgical history and problem list.  Review of Systems A comprehensive review of systems was negative.   Objective:    BP 115/73  Pulse 60  Ht 5\' 6"  (1.676 m)  Wt 229 lb (103.874 kg)  BMI 36.98 kg/m2 General appearance: alert, cooperative and appears stated age Head: Normocephalic, without obvious abnormality, atraumatic Eyes: conj clear, EOMi, PEERLA Ears: normal TM's and external ear canals both ears Nose: Nares normal. Septum midline. Mucosa normal. No drainage or sinus tenderness. Throat: lips, mucosa, and tongue  normal; teeth and gums normal Neck: no adenopathy, no carotid bruit, no JVD, supple, symmetrical, trachea midline and thyroid not enlarged, symmetric, no tenderness/mass/nodules Back: symmetric, no curvature. ROM normal. No CVA tenderness. Lungs: clear to auscultation bilaterally Breasts: normal appearance, no masses or tenderness Heart: regular rate and rhythm, S1, S2 normal, no murmur, click, rub or gallop Abdomen: soft, non-tender; bowel sounds normal; no masses,  no organomegaly Extremities: extremities normal, atraumatic, no cyanosis or edema Pulses: 2+ and symmetric Skin: Skin color, texture, turgor normal. No rashes or lesions Lymph nodes: Cervical, supraclavicular, and axillary nodes normal. Neurologic: Alert and oriented X 3, normal strength and tone. Normal symmetric reflexes. Normal coordination and gait    Assessment:    Healthy female exam.      Plan:     See After Visit Summary for Counseling Recommendations  Keep up a regular exercise program and make sure you are eating a healthy diet Try to eat 4 servings of dairy a day, or if you are lactose intolerant take a calcium with vitamin D daily.  Your vaccines are up to date.   BMI 36 - she has some spots of the phentermine but is contraindicated with her Cymbalta so she has opted not to do a medication at this point time. She really wants to try getting back to the gym and working on diet and portion sizes. We  discussed this for quite some time.

## 2013-10-23 NOTE — Patient Instructions (Signed)
complete physical examination Keep up a regular exercise program and make sure you are eating a healthy diet Try to eat 4 servings of dairy a day, or if you are lactose intolerant take a calcium with vitamin D daily.  Your vaccines are up to date.   

## 2013-10-24 ENCOUNTER — Telehealth: Payer: Self-pay | Admitting: Family Medicine

## 2013-10-24 LAB — COMPLETE METABOLIC PANEL WITH GFR
ALBUMIN: 4.4 g/dL (ref 3.5–5.2)
ALK PHOS: 133 U/L — AB (ref 39–117)
ALT: 17 U/L (ref 0–35)
AST: 22 U/L (ref 0–37)
BILIRUBIN TOTAL: 0.6 mg/dL (ref 0.2–1.2)
BUN: 10 mg/dL (ref 6–23)
CO2: 26 mEq/L (ref 19–32)
Calcium: 9.8 mg/dL (ref 8.4–10.5)
Chloride: 102 mEq/L (ref 96–112)
Creat: 0.75 mg/dL (ref 0.50–1.10)
GFR, Est African American: >89 mL/min
GFR, Est Non African American: 89 mL/min
Glucose, Bld: 92 mg/dL (ref 70–99)
Potassium: 4.5 mEq/L (ref 3.5–5.3)
SODIUM: 138 meq/L (ref 135–145)
TOTAL PROTEIN: 7.2 g/dL (ref 6.0–8.3)

## 2013-10-24 LAB — GAMMA GT: GGT: 11 U/L (ref 7–51)

## 2013-10-24 NOTE — Telephone Encounter (Signed)
Dr. Madilyn Fireman had ask me to f/u on her hearing something about her cpap and that a order was placed in August and patient has not heard anything. I do not see an order for cpap? Thanks, [employer]

## 2013-10-27 ENCOUNTER — Telehealth: Payer: Self-pay | Admitting: *Deleted

## 2013-10-27 DIAGNOSIS — Z9989 Dependence on other enabling machines and devices: Principal | ICD-10-CM

## 2013-10-27 DIAGNOSIS — G4733 Obstructive sleep apnea (adult) (pediatric): Secondary | ICD-10-CM

## 2013-10-27 NOTE — Telephone Encounter (Signed)
Left a message for patient to return call.

## 2013-10-27 NOTE — Telephone Encounter (Signed)
Pt spoke with pernice at [facility] and was told that she was on auto 5-20. Basically they need an RX for the settings that Dr. Madilyn Fireman would like for her to be set at. Also be advised that she is set at auto 5-20 and this is a wireless/wired modem that can be set at their office remotely .Patricia Waters

## 2013-10-29 ENCOUNTER — Ambulatory Visit: Payer: 59 | Attending: Family Medicine | Admitting: Physical Therapy

## 2013-10-29 DIAGNOSIS — I890 Lymphedema, not elsewhere classified: Secondary | ICD-10-CM | POA: Diagnosis not present

## 2013-10-29 DIAGNOSIS — IMO0001 Reserved for inherently not codable concepts without codable children: Secondary | ICD-10-CM | POA: Insufficient documentation

## 2013-10-29 NOTE — Telephone Encounter (Signed)
Will need a new script for cpap settings to be sent to [facility] for pt.Patricia Waters [place]

## 2013-10-30 MED ORDER — AMBULATORY NON FORMULARY MEDICATION: Status: DC

## 2013-10-30 NOTE — Telephone Encounter (Deleted)
Yes, but I need a download from her machine so I can see where pressures are typically needed to correct 90-95% of her apneas for me to be able to set her machine. We had Re: fax them an order requesting a copy of a download from her machine and they have not sent her to [name].

## 2013-10-30 NOTE — Telephone Encounter (Signed)
Letter printed.

## 2013-10-31 NOTE — Telephone Encounter (Signed)
Orders have been faxed twice.

## 2013-11-03 ENCOUNTER — Telehealth: Payer: Self-pay | Admitting: Family Medicine

## 2013-11-03 NOTE — Telephone Encounter (Signed)
Patient called and states that when she called [facility] because they still have not came out to adjust her C-pap as you and her discussed , They informed her that they never received a updated order. Can you please print the order and give to me? I informed patient that I would fax it to them once you print it and then I will call patient and let her know, so she will know that I have done it. Thanks, [employer]

## 2013-11-11 ENCOUNTER — Ambulatory Visit: Payer: 59 | Attending: Family Medicine | Admitting: Physical Therapy

## 2013-11-11 DIAGNOSIS — Z5189 Encounter for other specified aftercare: Secondary | ICD-10-CM | POA: Diagnosis not present

## 2013-11-11 DIAGNOSIS — I890 Lymphedema, not elsewhere classified: Secondary | ICD-10-CM | POA: Insufficient documentation

## 2013-11-13 ENCOUNTER — Ambulatory Visit: Payer: 59 | Admitting: Physical Therapy

## 2013-11-13 DIAGNOSIS — Z5189 Encounter for other specified aftercare: Secondary | ICD-10-CM | POA: Diagnosis not present

## 2013-12-08 ENCOUNTER — Other Ambulatory Visit: Payer: Self-pay | Admitting: Family Medicine

## 2013-12-08 ENCOUNTER — Telehealth: Payer: Self-pay | Admitting: *Deleted

## 2013-12-08 ENCOUNTER — Ambulatory Visit (INDEPENDENT_AMBULATORY_CARE_PROVIDER_SITE_OTHER): Payer: 59 | Admitting: Family Medicine

## 2013-12-08 ENCOUNTER — Encounter: Payer: Self-pay | Admitting: Family Medicine

## 2013-12-08 VITALS — BP 114/65 | HR 56 | Wt 227.0 lb

## 2013-12-08 DIAGNOSIS — R748 Abnormal levels of other serum enzymes: Secondary | ICD-10-CM

## 2013-12-08 DIAGNOSIS — G4733 Obstructive sleep apnea (adult) (pediatric): Secondary | ICD-10-CM

## 2013-12-08 DIAGNOSIS — Z9989 Dependence on other enabling machines and devices: Secondary | ICD-10-CM

## 2013-12-08 DIAGNOSIS — I890 Lymphedema, not elsewhere classified: Secondary | ICD-10-CM

## 2013-12-08 MED ORDER — DULOXETINE HCL 30 MG PO CPEP: ORAL_CAPSULE | ORAL | Status: DC

## 2013-12-08 NOTE — Progress Notes (Signed)
   Subjective:    Patient ID: Patricia Waters, female    DOB: 29-Sep-1955, 58 y.o.   MRN: [id number]  HPI F/U OSA for CPAP. She is doing well opn 10 cm water pressure.  Doing well on it.she says she is using it every night for at least 8 hours.  Started working out at [employer] for 3 days per week.  She has been doing a lot of hard work.  She is having some problems with the nasal pillars.  Doesn't seem to be yawning as much.  No longer taking naps.    Lymphedema - doing video to help with the lymphedema. She is completed physical therapy and is now just doing maintenance therapy at home. Also now has compression garment and bra too.    Follow-up elevated alkaline phosphatase level-she reports that back in 2011 she was noted to have a sclerotic lesion in the left pelvis. She then saw her oncologist at the time and had a bone scan performed as well as an MRI of the lumbar and thoracic spine. They did not detect any metastatic lesions at that time, but do see some significant arthritis. [facility] were not elevated at that time. Review of Systems     Objective:   Physical Exam  Constitutional: She is oriented to person, place, and time. She appears well-developed and well-nourished.  HENT:  Head: Normocephalic and atraumatic.  Cardiovascular: Normal rate, regular rhythm and normal heart sounds.   Pulmonary/Chest: Effort normal and breath sounds normal.  Neurological: She is alert and oriented to person, place, and time.  Skin: Skin is warm and dry.  Psychiatric: She has a normal mood and affect. Her behavior is normal.          Assessment & Plan:  Sleep apnea - will call for reportfor the last 30 days but it sounds like she's been very compliant. She is very comfortable with her current pressure.we can adjust the pressure if needed based on the report once I get it in.she's not having any significant problem with nasal pillows. Occasionally she does mouth breathe so [facility] to [facility]instrap.  Lympedema - dong well with maintenance at home.   Elevated Alk phos- Recheck today.  If still elevated then we may need to consider this could be coming from bone. Her GGT and AST, ALT were all normal.

## 2013-12-08 NOTE — Telephone Encounter (Signed)
Asked that they send most recent download of her C-PAP.Patricia Waters

## 2013-12-09 ENCOUNTER — Telehealth: Payer: Self-pay | Admitting: *Deleted

## 2013-12-09 ENCOUNTER — Telehealth: Payer: Self-pay | Admitting: Family Medicine

## 2013-12-09 DIAGNOSIS — C50912 Malignant neoplasm of unspecified site of left female breast: Secondary | ICD-10-CM

## 2013-12-09 DIAGNOSIS — R748 Abnormal levels of other serum enzymes: Secondary | ICD-10-CM

## 2013-12-09 DIAGNOSIS — C50911 Malignant neoplasm of unspecified site of right female breast: Secondary | ICD-10-CM

## 2013-12-09 LAB — TSH: TSH: 3.007 u[IU]/mL (ref 0.350–4.500)

## 2013-12-09 LAB — ALKALINE PHOSPHATASE: Alkaline Phosphatase: 122 U/L — ABNORMAL HIGH (ref 39–117)

## 2013-12-09 NOTE — Telephone Encounter (Signed)
Please call patient: I did give her download a compliance report from [facility]. Everything looks great. We will continue with current regimen for now.

## 2013-12-10 LAB — VITAMIN D 25 HYDROXY (VIT D DEFICIENCY, FRACTURES): Vit D, 25-Hydroxy: 63 ng/mL (ref 30–89)

## 2013-12-10 LAB — PARATHYROID HORMONE, INTACT (NO CA): PTH: 42 pg/mL (ref 14–64)

## 2013-12-10 NOTE — Telephone Encounter (Signed)
Patient advised of recommendations.  

## 2013-12-10 NOTE — Progress Notes (Signed)
Quick Note:  All labs are normal. ______ 

## 2013-12-11 ENCOUNTER — Telehealth: Payer: Self-pay

## 2013-12-11 NOTE — Telephone Encounter (Signed)
Bone scan required a PA. Approval [id number]. Valid through 12/11/2013-01/25/2014.

## 2013-12-11 NOTE — Telephone Encounter (Signed)
Patricia Waters does want bone scan ASAP. Sent order.

## 2013-12-17 ENCOUNTER — Encounter: Payer: Self-pay | Admitting: Family Medicine

## 2013-12-19 ENCOUNTER — Encounter (HOSPITAL_COMMUNITY)
Admission: RE | Admit: 2013-12-19 | Discharge: 2013-12-19 | Disposition: A | Discharge status: Home / Self Care | Payer: 59 | Source: Ambulatory Visit | Attending: Family Medicine | Admitting: Family Medicine

## 2013-12-19 ENCOUNTER — Encounter (HOSPITAL_COMMUNITY): Payer: Self-pay

## 2013-12-19 DIAGNOSIS — C50911 Malignant neoplasm of unspecified site of right female breast: Secondary | ICD-10-CM

## 2013-12-19 DIAGNOSIS — C50912 Malignant neoplasm of unspecified site of left female breast: Secondary | ICD-10-CM | POA: Insufficient documentation

## 2013-12-19 MED ORDER — TECHNETIUM TC 99M MEDRONATE IV KIT
25.0000 | PACK | Freq: Once | INTRAVENOUS | Status: AC | PRN
  Administered 2013-12-19: 26 via INTRAVENOUS

## 2013-12-22 ENCOUNTER — Other Ambulatory Visit: Payer: Self-pay | Admitting: *Deleted

## 2013-12-22 DIAGNOSIS — R748 Abnormal levels of other serum enzymes: Secondary | ICD-10-CM

## 2014-01-02 ENCOUNTER — Other Ambulatory Visit: Payer: Self-pay | Admitting: *Deleted

## 2014-01-02 DIAGNOSIS — R748 Abnormal levels of other serum enzymes: Secondary | ICD-10-CM

## 2014-01-20 ENCOUNTER — Ambulatory Visit (HOSPITAL_BASED_OUTPATIENT_CLINIC_OR_DEPARTMENT_OTHER): Payer: 59 | Admitting: Oncology

## 2014-01-20 ENCOUNTER — Other Ambulatory Visit: Payer: Self-pay | Admitting: Emergency Medicine

## 2014-01-20 ENCOUNTER — Ambulatory Visit (HOSPITAL_BASED_OUTPATIENT_CLINIC_OR_DEPARTMENT_OTHER): Payer: 59 | Admitting: Lab

## 2014-01-20 ENCOUNTER — Ambulatory Visit: Payer: 59

## 2014-01-20 ENCOUNTER — Encounter: Payer: Self-pay | Admitting: Oncology

## 2014-01-20 ENCOUNTER — Other Ambulatory Visit: Payer: Self-pay | Admitting: Oncology

## 2014-01-20 VITALS — BP 114/69 | HR 62 | Temp 98.5°F | Resp 18 | Ht 66.0 in | Wt 229.4 lb

## 2014-01-20 DIAGNOSIS — Z853 Personal history of malignant neoplasm of breast: Secondary | ICD-10-CM

## 2014-01-20 DIAGNOSIS — Z86000 Personal history of in-situ neoplasm of breast: Secondary | ICD-10-CM | POA: Insufficient documentation

## 2014-01-20 DIAGNOSIS — C50912 Malignant neoplasm of unspecified site of left female breast: Secondary | ICD-10-CM

## 2014-01-20 LAB — CBC WITH DIFFERENTIAL/PLATELET
BASO%: 0.4 % (ref 0.0–2.0)
Basophils Absolute: 0 10*3/uL (ref 0.0–0.1)
EOS%: 1.2 % (ref 0.0–7.0)
Eosinophils Absolute: 0.1 10*3/uL (ref 0.0–0.5)
HEMATOCRIT: 38.9 % (ref 34.8–46.6)
HGB: 13 g/dL (ref 11.6–15.9)
LYMPH#: 2.7 10*3/uL (ref 0.9–3.3)
LYMPH%: 30 % (ref 14.0–49.7)
MCH: 31.3 pg (ref 25.1–34.0)
MCHC: 33.4 g/dL (ref 31.5–36.0)
MCV: 93.7 fL (ref 79.5–101.0)
MONO#: 0.7 10*3/uL (ref 0.1–0.9)
MONO%: 7.5 % (ref 0.0–14.0)
NEUT#: 5.6 10*3/uL (ref 1.5–6.5)
NEUT%: 60.9 % (ref 38.4–76.8)
Platelets: 273 10*3/uL (ref 145–400)
RBC: 4.15 10*6/uL (ref 3.70–5.45)
RDW: 12.6 % (ref 11.2–14.5)
WBC: 9.1 10*3/uL (ref 3.9–10.3)

## 2014-01-20 LAB — COMPREHENSIVE METABOLIC PANEL (CC13)
ALT: 20 U/L (ref 0–55)
AST: 22 U/L (ref 5–34)
Albumin: 4 g/dL (ref 3.5–5.0)
Alkaline Phosphatase: 134 U/L (ref 40–150)
Anion Gap: 10 mEq/L (ref 3–11)
BUN: 14.6 mg/dL (ref 7.0–26.0)
CO2: 28 meq/L (ref 22–29)
CREATININE: 0.8 mg/dL (ref 0.6–1.1)
Calcium: 9.8 mg/dL (ref 8.4–10.4)
Chloride: 103 mEq/L (ref 98–109)
EGFR: 77 mL/min/{1.73_m2} — ABNORMAL LOW (ref >90)
Glucose: 99 mg/dl (ref 70–140)
Potassium: 4.2 mEq/L (ref 3.5–5.1)
Sodium: 141 mEq/L (ref 136–145)
Total Protein: 7.3 g/dL (ref 6.4–8.3)

## 2014-01-20 NOTE — Progress Notes (Signed)
Checked in new pt with no financial concerns at this time.  Informed pt I will call her ins to inquire if Josem Kaufmann is req for chemo if it is part of her treatment and will obtain it if it is needed also will contact foundations that offer copay assistance for chemo if needed.  She has my card for any questions or concerns.
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
## 2014-03-10 ENCOUNTER — Encounter: Payer: Self-pay | Admitting: Family Medicine

## 2014-03-10 ENCOUNTER — Ambulatory Visit (INDEPENDENT_AMBULATORY_CARE_PROVIDER_SITE_OTHER): Payer: 59 | Admitting: Family Medicine

## 2014-03-10 VITALS — BP 114/72 | HR 75 | Temp 98.0°F | Ht 66.0 in | Wt 236.0 lb

## 2014-03-10 DIAGNOSIS — J019 Acute sinusitis, unspecified: Secondary | ICD-10-CM

## 2014-03-10 DIAGNOSIS — Z6838 Body mass index (BMI) 38.0-38.9, adult: Secondary | ICD-10-CM

## 2014-03-10 DIAGNOSIS — G4733 Obstructive sleep apnea (adult) (pediatric): Secondary | ICD-10-CM

## 2014-03-10 DIAGNOSIS — R7301 Impaired fasting glucose: Secondary | ICD-10-CM

## 2014-03-10 DIAGNOSIS — IMO0001 Reserved for inherently not codable concepts without codable children: Secondary | ICD-10-CM

## 2014-03-10 LAB — POCT GLYCOSYLATED HEMOGLOBIN (HGB A1C): Hemoglobin A1C: 5.7

## 2014-03-10 MED ORDER — AMOXICILLIN-POT CLAVULANATE 875-125 MG PO TABS: 1.0000 | ORAL_TABLET | Freq: Two times a day (BID) | ORAL | Status: DC

## 2014-03-10 NOTE — Progress Notes (Signed)
   Subjective:    Patient ID: Patricia Waters, female    DOB: 07-Dec-1955, 59 y.o.   MRN: [id number]  HPI 8 days of congestion and cough.  2 days cough has been more productive.  muscous is yellow/green.  No fever, chills.  Tried some OTC medication. Getting ready to leave town.  No ST or ear pain.  On GI sxs.  Feels tired.    IFG - no inc thirst or urination  Sleep apnea - using Patricia Waters CPAP consitantly.  Skipped one night since was sick.  Using the nasal pillows. Waking up feeling better.   She has been getting more active and playing basketball.  The she has been snacking a lot. Patricia Waters partner who is here with Patricia Waters today says that she's been eating granola bars and lots of things in between meals that add up to a lot of calories during the daytime.  Review of Systems     Objective:   Physical Exam  Constitutional: She is oriented to person, place, and time. She appears well-developed and well-nourished.  HENT:  Head: Normocephalic and atraumatic.  Right Ear: External ear normal.  Left Ear: External ear normal.  Nose: Nose normal.  Mouth/Throat: Oropharynx is clear and moist.  TMs and canals are clear.   Eyes: Conjunctivae and EOM are normal. Pupils are equal, round, and reactive to light.  Neck: Neck supple. No thyromegaly present.  Cardiovascular: Normal rate, regular rhythm and normal heart sounds.   Pulmonary/Chest: Effort normal and breath sounds normal. She has no wheezes.  Lymphadenopathy:    She has no cervical adenopathy.  Neurological: She is alert and oriented to person, place, and time.  Skin: Skin is warm and dry.  Psychiatric: She has a normal mood and affect.          Assessment & Plan:  Impaired fasting glucose-A1c today is 5.7 which is fantastic, though up from preivous. Work on diet and exercise.  F/U in3-4 months.    Sleep apnea on CPAP. Using it consistantly.   Acute sinusitis - Augmentin.  H.O given.all if not better or suddenly worse.   BMI 38 - discussed  possibly referral to nutritionist versus perception medications that can help with weight loss. She has been going to the gym but has been overeating. She says she wants to make a commitment to herself or limited to avoid snacking. She wants to try this first.

## 2014-03-10 NOTE — Patient Instructions (Signed)

## 2014-03-16 ENCOUNTER — Other Ambulatory Visit: Payer: Self-pay | Admitting: Oncology

## 2014-04-07 ENCOUNTER — Other Ambulatory Visit: Payer: Self-pay

## 2014-04-07 MED ORDER — ROPINIROLE HCL 0.5 MG PO TABS: 0.5000 mg | ORAL_TABLET | Freq: Every day | ORAL | Status: DC

## 2014-04-22 ENCOUNTER — Other Ambulatory Visit: Payer: Self-pay | Admitting: Family Medicine

## 2014-06-09 ENCOUNTER — Ambulatory Visit: Payer: 59 | Admitting: Family Medicine

## 2014-06-16 ENCOUNTER — Encounter: Payer: Self-pay | Admitting: Family Medicine

## 2014-06-16 ENCOUNTER — Ambulatory Visit (INDEPENDENT_AMBULATORY_CARE_PROVIDER_SITE_OTHER): Payer: 59 | Admitting: Family Medicine

## 2014-06-16 VITALS — BP 105/68 | HR 80 | Wt 232.0 lb

## 2014-06-16 DIAGNOSIS — R748 Abnormal levels of other serum enzymes: Secondary | ICD-10-CM

## 2014-06-16 DIAGNOSIS — G4733 Obstructive sleep apnea (adult) (pediatric): Secondary | ICD-10-CM

## 2014-06-16 DIAGNOSIS — Z9989 Dependence on other enabling machines and devices: Secondary | ICD-10-CM

## 2014-06-16 DIAGNOSIS — E785 Hyperlipidemia, unspecified: Secondary | ICD-10-CM | POA: Diagnosis not present

## 2014-06-16 DIAGNOSIS — E669 Obesity, unspecified: Secondary | ICD-10-CM

## 2014-06-16 DIAGNOSIS — R7301 Impaired fasting glucose: Secondary | ICD-10-CM | POA: Diagnosis not present

## 2014-06-16 DIAGNOSIS — R635 Abnormal weight gain: Secondary | ICD-10-CM

## 2014-06-16 LAB — BASIC METABOLIC PANEL
BUN: 13 mg/dL (ref 6–23)
CO2: 27 mEq/L (ref 19–32)
Calcium: 9.8 mg/dL (ref 8.4–10.5)
Chloride: 102 mEq/L (ref 96–112)
Creat: 0.71 mg/dL (ref 0.50–1.10)
Glucose, Bld: 94 mg/dL (ref 70–99)
Potassium: 4.8 mEq/L (ref 3.5–5.3)
Sodium: 140 mEq/L (ref 135–145)

## 2014-06-16 LAB — LIPID PANEL
Cholesterol: 194 mg/dL (ref 0–200)
HDL: 60 mg/dL (ref >=46)
LDL Cholesterol: 108 mg/dL — ABNORMAL HIGH (ref 0–99)
TRIGLYCERIDES: 132 mg/dL (ref <150)
Total CHOL/HDL Ratio: 3.2 Ratio
VLDL: 26 mg/dL (ref 0–40)

## 2014-06-16 LAB — POCT GLYCOSYLATED HEMOGLOBIN (HGB A1C): Hemoglobin A1C: 5.4

## 2014-06-16 LAB — ALKALINE PHOSPHATASE: Alkaline Phosphatase: 123 U/L — ABNORMAL HIGH (ref 39–117)

## 2014-06-16 MED ORDER — AMBULATORY NON FORMULARY MEDICATION: Status: DC

## 2014-06-16 NOTE — Progress Notes (Signed)
   Subjective:    Patient ID: Patricia Waters, female    DOB: 02/25/1955, 59 y.o.   MRN: [id number]  HPI Hyperlipidemia - On Krill oil. She has been trying to be more active.   IFG - she has lost 4 lbs.  Has cut out the sweets and treats.    Abnormal weight gain - Noticed inc in appetite on Requip.  Feels like her satiety center is just on high, especially at night. She is eating a lot of healthy foods but it is the portions that she has a hard time with. She really has cut out a lot of the sweets. But she still has a fairly high caloric intake. She has actually lost about 4 pounds since I last saw her. She says about 6 pounds on her home scale.  OSA - She feels like her pressure needs be increased. She's currently set at 10 cm of water pressure and [facility].  Review of Systems     Objective:   Physical Exam  Constitutional: She is oriented to person, place, and time. She appears well-developed and well-nourished.  HENT:  Head: Normocephalic and atraumatic.  Cardiovascular: Normal rate, regular rhythm and normal heart sounds.   Pulmonary/Chest: Effort normal and breath sounds normal.  Neurological: She is alert and oriented to person, place, and time.  Skin: Skin is warm and dry.  Psychiatric: She has a normal mood and affect. Her behavior is normal.          Assessment & Plan:  IFG- Much improved. A1C down to 5.4 back into the normal range.    Hyperlpidemia - recheckc lipoids. We'll call with results once available.  Abnormal weight gain/BMI 37 - Discussed optins. Will check to see if the requip can cause weight gain. Continue to encourage her to find physical activities that interest her and are not boring and that pushed her to work out more intensely. We also discussed options of weight loss medications that can help suppress increased appetite. I will also check on the Requip to see if I can find any data about it causing any increase in  appetite or weight gain.  OSA - will increase her CPAP setting to 11 7 m of water pressure and then have [facility] do a download in about 2 weeks to see if it looks like she is adequately treating her apnea.  Due to recheck alk phos since has been elevated.

## 2014-06-24 ENCOUNTER — Telehealth: Payer: Self-pay | Admitting: Family Medicine

## 2014-06-24 NOTE — Telephone Encounter (Signed)
Please call patient: I did do a little investigation. Increased appetite with the Requip was not listed in the package insert. In fact it can actually cause anorexia which is decreased appetite in about 4% of patients. Just want to let her know what I found.

## 2014-06-25 NOTE — Telephone Encounter (Signed)
Pt's wife notified of information.

## 2014-07-01 ENCOUNTER — Ambulatory Visit: Payer: 59 | Admitting: Obstetrics & Gynecology

## 2014-07-21 ENCOUNTER — Ambulatory Visit (INDEPENDENT_AMBULATORY_CARE_PROVIDER_SITE_OTHER): Payer: 59 | Admitting: Obstetrics & Gynecology

## 2014-07-21 ENCOUNTER — Encounter: Payer: Self-pay | Admitting: Obstetrics & Gynecology

## 2014-07-21 VITALS — BP 128/72 | HR 68 | Ht 66.0 in | Wt 239.0 lb

## 2014-07-21 DIAGNOSIS — Z01419 Encounter for gynecological examination (general) (routine) without abnormal findings: Secondary | ICD-10-CM | POA: Diagnosis not present

## 2014-07-21 DIAGNOSIS — Z803 Family history of malignant neoplasm of breast: Secondary | ICD-10-CM | POA: Diagnosis not present

## 2014-07-21 DIAGNOSIS — Z853 Personal history of malignant neoplasm of breast: Secondary | ICD-10-CM | POA: Diagnosis not present

## 2014-07-21 NOTE — Progress Notes (Signed)
  Subjective:    Patricia Waters is a 59 y.o. female who presents for an annual exam. The patient has no complaints today. The patient is sexually active. GYN screening history: last pap: approximate date 2015 and was normal. The patient wears seatbelts: yes.  The patient reports that there is not domestic violence in her life.   Menstrual History: OB History    No data available     No LMP recorded. Patient is postmenopausal.    The following portions of the patient's history were reviewed and updated as appropriate: allergies, current medications, past family history, past medical history, past social history, past surgical history and problem list.  Review of Systems A comprehensive review of systems was negative.    Objective:      Filed Vitals:   07/21/14 0806  BP: 128/72  Pulse: 68  Height: $Remove'5\' 6"'HoQYPmL$  (1.676 m)  Weight: 239 lb (108.41 kg)   Vitals:  WNL General appearance: alert, cooperative and no distress Head: Normocephalic, without obvious abnormality, atraumatic Eyes: negative Throat: lips, mucosa, and tongue normal; teeth and gums normal Lungs: clear to auscultation bilaterally Breasts: s/p bilateral mystectomy.  No lumps, masses, or tenderness Heart: regular rate and rhythm Abdomen: soft, non-tender; bowel sounds normal; no masses,  no organomegaly  Pelvic:  External Genitalia:  Tanner V, no lesion Urethra:  No prolapse Vagina:  Pale pink, no blood or discharge Cervix:  No CMT, no lesion Uterus:  Normal size and contour, non tender Adnexa:  Normal, no masses, non tender  Extremities: no edema, redness or tenderness in the calves or thighs; right upper extremity edema from radiation. Skin: no lesions or rash Lymph nodes: Axillary adenopathy: none; apical lymph nodes:  none   .    Assessment:    Healthy female exam.   Patient good candidate for new [facility] breast cancer panel   Plan:   Will f/u about [facility] test Pap not due until 2018 Dr. Curlene Labrum  manages breast cancer follow up  All questions answered.

## 2014-07-22 ENCOUNTER — Other Ambulatory Visit: Payer: Self-pay | Admitting: Family Medicine

## 2014-07-27 ENCOUNTER — Encounter: Payer: Self-pay | Admitting: Obstetrics & Gynecology

## 2014-07-28 ENCOUNTER — Other Ambulatory Visit (INDEPENDENT_AMBULATORY_CARE_PROVIDER_SITE_OTHER): Payer: 59

## 2014-07-28 DIAGNOSIS — Z8049 Family history of malignant neoplasm of other genital organs: Secondary | ICD-10-CM

## 2014-08-11 ENCOUNTER — Encounter: Payer: Self-pay | Admitting: *Deleted

## 2014-08-11 ENCOUNTER — Telehealth: Payer: Self-pay | Admitting: *Deleted

## 2014-08-11 NOTE — Telephone Encounter (Signed)
Copy of neg Brac 1 and 2 mailed to patient's home address.

## 2014-10-19 ENCOUNTER — Encounter: Payer: Self-pay | Admitting: Family Medicine

## 2014-10-19 ENCOUNTER — Ambulatory Visit (INDEPENDENT_AMBULATORY_CARE_PROVIDER_SITE_OTHER): Payer: 59 | Admitting: Family Medicine

## 2014-10-19 VITALS — BP 124/59 | HR 62 | Temp 99.1°F | Ht 66.0 in | Wt 239.0 lb

## 2014-10-19 DIAGNOSIS — R7301 Impaired fasting glucose: Secondary | ICD-10-CM

## 2014-10-19 DIAGNOSIS — Z23 Encounter for immunization: Secondary | ICD-10-CM

## 2014-10-19 DIAGNOSIS — E669 Obesity, unspecified: Secondary | ICD-10-CM | POA: Diagnosis not present

## 2014-10-19 DIAGNOSIS — G4733 Obstructive sleep apnea (adult) (pediatric): Secondary | ICD-10-CM | POA: Diagnosis not present

## 2014-10-19 DIAGNOSIS — H938X1 Other specified disorders of right ear: Secondary | ICD-10-CM

## 2014-10-19 DIAGNOSIS — R635 Abnormal weight gain: Secondary | ICD-10-CM

## 2014-10-19 LAB — POCT GLYCOSYLATED HEMOGLOBIN (HGB A1C): HEMOGLOBIN A1C: 5.6

## 2014-10-19 NOTE — Patient Instructions (Signed)
Phentermine tablets or capsules What is this medicine? PHENTERMINE (FEN ter meen) decreases your appetite. It is used with a reduced calorie diet and exercise to help you lose weight. This medicine may be used for other purposes; ask your health care provider or pharmacist if you have questions. COMMON BRAND NAME(S): Adipex-P, Atti-Plex P, Atti-Plex P Spansule, Fastin, Pro-Fast, Tara-8 What should I tell my health care provider before I take this medicine? They need to know if you have any of these conditions: -agitation -glaucoma -heart disease -high blood pressure -history of substance abuse -lung disease called Primary Pulmonary Hypertension (PPH) -taken an MAOI like Carbex, Eldepryl, Marplan, Nardil, or Parnate in last 14 days -thyroid disease -an unusual or allergic reaction to phentermine, other medicines, foods, dyes, or preservatives -pregnant or trying to get pregnant -breast-feeding How should I use this medicine? Take this medicine by mouth with a glass of water. Follow the directions on the prescription label. This medicine is usually taken 30 minutes before or 1 to 2 hours after breakfast. Avoid taking this medicine in the evening. It may interfere with sleep. Take your doses at regular intervals. Do not take your medicine more often than directed. Talk to your pediatrician regarding the use of this medicine in children. Special care may be needed. Overdosage: If you think you have taken too much of this medicine contact a poison control center or emergency room at once. NOTE: This medicine is only for you. Do not share this medicine with others. What if I miss a dose? If you miss a dose, take it as soon as you can. If it is almost time for your next dose, take only that dose. Do not take double or extra doses. What may interact with this medicine? Do not take this medicine with any of the following medications: -duloxetine -MAOIs like Carbex, Eldepryl, Marplan, Nardil, and  Parnate -medicines for colds or breathing difficulties like pseudoephedrine or phenylephrine -procarbazine -sibutramine -SSRIs like citalopram, escitalopram, fluoxetine, fluvoxamine, paroxetine, and sertraline -stimulants like dexmethylphenidate, methylphenidate or modafinil -venlafaxine This medicine may also interact with the following medications: -medicines for diabetes This list may not describe all possible interactions. Give your health care provider a list of all the medicines, herbs, non-prescription drugs, or dietary supplements you use. Also tell them if you smoke, drink alcohol, or use illegal drugs. Some items may interact with your medicine. What should I watch for while using this medicine? Notify your physician immediately if you become short of breath while doing your normal activities. Do not take this medicine within 6 hours of bedtime. It can keep you from getting to sleep. Avoid drinks that contain caffeine and try to stick to a regular bedtime every night. This medicine was intended to be used in addition to a healthy diet and exercise. The best results are achieved this way. This medicine is only indicated for short-term use. Eventually your weight loss may level out. At that point, the drug will only help you maintain your new weight. Do not increase or in any way change your dose without consulting your doctor. You may get drowsy or dizzy. Do not drive, use machinery, or do anything that needs mental alertness until you know how this medicine affects you. Do not stand or sit up quickly, especially if you are an older patient. This reduces the risk of dizzy or fainting spells. Alcohol may increase dizziness and drowsiness. Avoid alcoholic drinks. What side effects may I notice from receiving this medicine? Side effects that  you should report to your doctor or health care professional as soon as possible: -chest pain, palpitations -depression or severe changes in  mood -increased blood pressure -irritability -nervousness or restlessness -severe dizziness -shortness of breath -problems urinating -unusual swelling of the legs -vomiting Side effects that usually do not require medical attention (report to your doctor or health care professional if they continue or are bothersome): -blurred vision or other eye problems -changes in sexual ability or desire -constipation or diarrhea -difficulty sleeping -dry mouth or unpleasant taste -headache -nausea This list may not describe all possible side effects. Call your doctor for medical advice about side effects. You may report side effects to FDA at 1-800-FDA-1088. Where should I keep my medicine? Keep out of the reach of children. This medicine can be abused. Keep your medicine in a safe place to protect it from theft. Do not share this medicine with anyone. Selling or giving away this medicine is dangerous and against the law. Store at room temperature between 20 and 25 degrees C (68 and 77 degrees F). Keep container tightly closed. Throw away any unused medicine after the expiration date. NOTE: This sheet is a summary. It may not cover all possible information. If you have questions about this medicine, talk to your doctor, pharmacist, or health care provider.  2015, Elsevier/Gold Standard. (2010-03-02 11:02:44) Liraglutide injection (Weight Management) What is this medicine? LIRAGLUTIDE (LIR a GLOO tide) is used with a reduced calorie diet and exercise to help you lose weight. This medicine may be used for other purposes; ask your health care provider or pharmacist if you have questions. COMMON BRAND NAME(S): Saxenda What should I tell my health care provider before I take this medicine? They need to know if you have any of these conditions: -endocrine tumors (MEN 2) or if someone in your family had these tumors -gallstones -high cholesterol -history of alcohol abuse problem -history of  pancreatitis -kidney disease or if you are on dialysis -liver disease -previous swelling of the tongue, face, or lips with difficulty breathing, difficulty swallowing, hoarseness, or tightening of the throat -stomach problems -suicidal thoughts, plans, or attempt; a previous suicide attempt by you or a family member -thyroid cancer or if someone in your family had thyroid cancer -an unusual or allergic reaction to liraglutide, medicines, foods, dyes, or preservatives -pregnant or trying to get pregnant -breast-feeding How should I use this medicine? This medicine is for injection under the skin of your upper leg, stomach area, or upper arm. You will be taught how to prepare and give this medicine. Use exactly as directed. Take your medicine at regular intervals. Do not take it more often than directed. It is important that you put your used needles and syringes in a special sharps container. Do not put them in a trash can. If you do not have a sharps container, call your pharmacist or healthcare provider to get one. A special MedGuide will be given to you by the pharmacist with each prescription and refill. Be sure to read this information carefully each time. Talk to your pediatrician regarding the use of this medicine in children. Special care may be needed. Overdosage: If you think you've taken too much of this medicine contact a poison control center or emergency room at once. Overdosage: If you think you have taken too much of this medicine contact a poison control center or emergency room at once. NOTE: This medicine is only for you. Do not share this medicine with others. What if I  miss a dose? If you miss a dose, take it as soon as you can. If it is almost time for your next dose, take only that dose. Do not take double or extra doses. If you miss your dose for 3 days or more, call your doctor or health care professional to talk about how to restart this medicine. What may interact with  this medicine? -acetaminophen -atorvastatin -birth control pills -digoxin -griseofulvin -lisinopril This list may not describe all possible interactions. Give your health care provider a list of all the medicines, herbs, non-prescription drugs, or dietary supplements you use. Also tell them if you smoke, drink alcohol, or use illegal drugs. Some items may interact with your medicine. What should I watch for while using this medicine? Visit your doctor or health care professional for regular checks on your progress. This medicine is intended to be used in addition to a healthy diet and appropriate exercise. The best results are achieved this way. Do not increase or in any way change your dose without consulting your doctor or health care professional. This medicine may affect blood sugar levels. If you have diabetes, check with your doctor or health care professional before you change your diet or the dose of your diabetic medicine. Patients and their families should watch out for worsening depression or thoughts of suicide. Also watch out for sudden changes in feelings such as feeling anxious, agitated, panicky, irritable, hostile, aggressive, impulsive, severely restless, overly excited and hyperactive, or not being able to sleep. If this happens, especially at the beginning of treatment or after a change in dose, call your health care professional. What side effects may I notice from receiving this medicine? Side effects that you should report to your doctor or health care professional as soon as possible: -allergic reactions like skin rash, itching or hives, swelling of the face, lips, or tongue -breathing problems -fever, chills -loss of appetite -signs and symptoms of low blood sugar such as feeling anxious, confusion, dizziness, increased hunger, unusually weak or tired, sweating, shakiness, cold, irritable, headache, blurred vision, fast heartbeat, loss of consciousness -trouble passing  urine or change in the amount of urine -unusual stomach pain or upset -vomiting Side effects that usually do not require medical attention (Report these to your doctor or health care professional if they continue or are bothersome.): -constipation -diarrhea -fatigue -headache -nausea This list may not describe all possible side effects. Call your doctor for medical advice about side effects. You may report side effects to FDA at 1-800-FDA-1088. Where should I keep my medicine? Keep out of the reach of children. Store unopened pen in a refrigerator between 2 and 8 degrees C (36 and 46 degrees F). Do not freeze or use if the medicine has been frozen. Protect from light and excessive heat. After you first use the pen, it can be stored at room temperature between 15 and 30 degrees C (59 and 86 degrees F) or in a refrigerator. Throw away your used pen after 30 days or after the expiration date, whichever comes first. Do not store your pen with the needle attached. If the needle is left on, medicine may leak from the pen. NOTE: This sheet is a summary. It may not cover all possible information. If you have questions about this medicine, talk to your doctor, pharmacist, or health care provider.  2015, Elsevier/Gold Standard. (2013-03-13 12:29:49)

## 2014-10-19 NOTE — Progress Notes (Signed)
   Subjective:    Patient ID: Patricia Waters, female    DOB: 1955/09/01, 59 y.o.   MRN: [id number]  HPI IFG - she has not been exercising. She weighs the most she has ever weighed.   Sleep apnea - doing well with the CPAP. She's she's been wearing it consistently.  She's a little distressed today. She has gained weight and now weighs 239 pounds. She says she's never been this heavy in her life. She recently quit going to the gym because her partner had surgery and she's been staying home to help take care of her. She's not really been controlling her diet and intake.  She feels like her right ear is a little bit clogged. She's been using a nasal steroid spray and Claritin daily for her allergies. No fevers chills or sweats and no ear pain.  Review of Systems     Objective:   Physical Exam  Constitutional: She is oriented to person, place, and time. She appears well-developed and well-nourished.  HENT:  Head: Normocephalic and atraumatic.  Right Ear: External ear normal.  Nose: Nose normal.  Right tympanic membrane and canal is clear. No fluid. Normal light reflex.  Cardiovascular: Normal rate, regular rhythm and normal heart sounds.   Pulmonary/Chest: Effort normal and breath sounds normal.  Neurological: She is alert and oriented to person, place, and time.  Skin: Skin is warm and dry.  Psychiatric: She has a normal mood and affect. Her behavior is normal.          Assessment & Plan:  IFG - looks great! Her A1c as up a little bit at 5.6 but still looks great. Will follow-up in 6 months.  Sleep apnea - continue CPAP. Doing well. We'll call the company to get a download from the CPAP report.  Abnormal weight gain/BMI 38-we discussed strategies to help her get back on track. She plans on starting back at the [facility]. Also discussed potential use of weight loss medications. She would be a good candidate for phentermine or possibly even Sexenda. Discussed both of these medications  and how they work and the benefits and risks. Additional information provided.  Right ear pressure-exam is completely normal. Gave reassurance. Call back if any further progression of symptoms are worsening.

## 2014-11-27 ENCOUNTER — Other Ambulatory Visit: Payer: Self-pay | Admitting: Family Medicine

## 2015-02-18 ENCOUNTER — Encounter: Payer: Self-pay | Admitting: Family Medicine

## 2015-02-18 ENCOUNTER — Ambulatory Visit (INDEPENDENT_AMBULATORY_CARE_PROVIDER_SITE_OTHER): Payer: 59 | Admitting: Family Medicine

## 2015-02-18 VITALS — BP 127/57 | HR 64 | Ht 66.0 in | Wt 244.0 lb

## 2015-02-18 DIAGNOSIS — G4731 Primary central sleep apnea: Secondary | ICD-10-CM

## 2015-02-18 DIAGNOSIS — Z6839 Body mass index (BMI) 39.0-39.9, adult: Secondary | ICD-10-CM | POA: Diagnosis not present

## 2015-02-18 DIAGNOSIS — E669 Obesity, unspecified: Secondary | ICD-10-CM | POA: Diagnosis not present

## 2015-02-18 DIAGNOSIS — R7301 Impaired fasting glucose: Secondary | ICD-10-CM | POA: Diagnosis not present

## 2015-02-18 LAB — POCT GLYCOSYLATED HEMOGLOBIN (HGB A1C): Hemoglobin A1C: 5.6

## 2015-02-18 MED ORDER — AMBULATORY NON FORMULARY MEDICATION: Status: DC

## 2015-02-18 NOTE — Progress Notes (Addendum)
   Subjective:    Patient ID: Patricia Waters, female    DOB: 05-03-1955, 60 y.o.   MRN: [id number]  HPI IFG - no increased thirst or urination. She was working out at [employer] fairly regularly up until a few weeks ago.  She has gained some weight over the Holidays.  She has been trying to get back on track the last week or so.  Sleep apnea - she is doing well using her CPAP nightly. She does have a application on her phone and gets report saying that she is doing well with it.  Obesity-she is still struggling with increased appetite and craving sweets. She has been trying to do better and portion control.   Review of Systems     Objective:   Physical Exam  Constitutional: She is oriented to person, place, and time. She appears well-developed and well-nourished.  HENT:  Head: Normocephalic and atraumatic.  Cardiovascular: Normal rate, regular rhythm and normal heart sounds.   Pulmonary/Chest: Effort normal and breath sounds normal.  Neurological: She is alert and oriented to person, place, and time.  Skin: Skin is warm and dry.  Psychiatric: She has a normal mood and affect. Her behavior is normal.          Assessment & Plan:  IFG -  Well controlled. Continue current regimen. Follow-up in 6 months.  Sleep apnea-we'll try to call [facility] to get a download from the last 30 days for her CPAP.  Obesity/BMI 39.  We previously discussed trying phentermine. After reading more about it she decided that wasn't the best option for her. I actually think she would be a great candidate for Sexenda. She would need to check to see if it's covered by her insurance. I gave her some additional information we discussed the risks and benefits of the medication.   Bilateral plantar fasciitis-she also needs a new prescription for some orthotics/inserts. This does provide significant relief for her and she needs a new pair.

## 2015-02-18 NOTE — Patient Instructions (Signed)
Liraglutide injection (Weight Management) What is this medicine? LIRAGLUTIDE (LIR a GLOO tide) is used with a reduced calorie diet and exercise to help you lose weight. This medicine may be used for other purposes; ask your health care provider or pharmacist if you have questions. What should I tell my health care provider before I take this medicine? They need to know if you have any of these conditions: -endocrine tumors (MEN 2) or if someone in your family had these tumors -gallstones -high cholesterol -history of alcohol abuse problem -history of pancreatitis -kidney disease or if you are on dialysis -liver disease -previous swelling of the tongue, face, or lips with difficulty breathing, difficulty swallowing, hoarseness, or tightening of the throat -stomach problems -suicidal thoughts, plans, or attempt; a previous suicide attempt by you or a family member -thyroid cancer or if someone in your family had thyroid cancer -an unusual or allergic reaction to liraglutide, medicines, foods, dyes, or preservatives -pregnant or trying to get pregnant -breast-feeding How should I use this medicine? This medicine is for injection under the skin of your upper leg, stomach area, or upper arm. You will be taught how to prepare and give this medicine. Use exactly as directed. Take your medicine at regular intervals. Do not take it more often than directed. It is important that you put your used needles and syringes in a special sharps container. Do not put them in a trash can. If you do not have a sharps container, call your pharmacist or healthcare provider to get one. A special MedGuide will be given to you by the pharmacist with each prescription and refill. Be sure to read this information carefully each time. Talk to your pediatrician regarding the use of this medicine in children. Special care may be needed. Overdosage: If you think you have taken too much of this medicine contact a poison  control center or emergency room at once. NOTE: This medicine is only for you. Do not share this medicine with others. What if I miss a dose? If you miss a dose, take it as soon as you can. If it is almost time for your next dose, take only that dose. Do not take double or extra doses. If you miss your dose for 3 days or more, call your doctor or health care professional to talk about how to restart this medicine. What may interact with this medicine? -acetaminophen -atorvastatin -birth control pills -digoxin -griseofulvin -lisinopril This list may not describe all possible interactions. Give your health care provider a list of all the medicines, herbs, non-prescription drugs, or dietary supplements you use. Also tell them if you smoke, drink alcohol, or use illegal drugs. Some items may interact with your medicine. What should I watch for while using this medicine? Visit your doctor or health care professional for regular checks on your progress. This medicine is intended to be used in addition to a healthy diet and appropriate exercise. The best results are achieved this way. Do not increase or in any way change your dose without consulting your doctor or health care professional. This medicine may affect blood sugar levels. If you have diabetes, check with your doctor or health care professional before you change your diet or the dose of your diabetic medicine. Patients and their families should watch out for worsening depression or thoughts of suicide. Also watch out for sudden changes in feelings such as feeling anxious, agitated, panicky, irritable, hostile, aggressive, impulsive, severely restless, overly excited and hyperactive, or not being  able to sleep. If this happens, especially at the beginning of treatment or after a change in dose, call your health care professional. What side effects may I notice from receiving this medicine? Side effects that you should report to your doctor or  health care professional as soon as possible: -allergic reactions like skin rash, itching or hives, swelling of the face, lips, or tongue -breathing problems -fever, chills -loss of appetite -signs and symptoms of low blood sugar such as feeling anxious, confusion, dizziness, increased hunger, unusually weak or tired, sweating, shakiness, cold, irritable, headache, blurred vision, fast heartbeat, loss of consciousness -trouble passing urine or change in the amount of urine -unusual stomach pain or upset -vomiting Side effects that usually do not require medical attention (Report these to your doctor or health care professional if they continue or are bothersome.): -constipation -diarrhea -fatigue -headache -nausea This list may not describe all possible side effects. Call your doctor for medical advice about side effects. You may report side effects to FDA at 1-800-FDA-1088. Where should I keep my medicine? Keep out of the reach of children. Store unopened pen in a refrigerator between 2 and 8 degrees C (36 and 46 degrees F). Do not freeze or use if the medicine has been frozen. Protect from light and excessive heat. After you first use the pen, it can be stored at room temperature between 15 and 30 degrees C (59 and 86 degrees F) or in a refrigerator. Throw away your used pen after 30 days or after the expiration date, whichever comes first. Do not store your pen with the needle attached. If the needle is left on, medicine may leak from the pen. NOTE: This sheet is a summary. It may not cover all possible information. If you have questions about this medicine, talk to your doctor, pharmacist, or health care provider.    2016, Elsevier/Gold Standard. (2013-03-13 12:29:49)

## 2015-03-03 ENCOUNTER — Other Ambulatory Visit: Payer: Self-pay | Admitting: Family Medicine

## 2015-03-08 ENCOUNTER — Telehealth: Payer: Self-pay | Admitting: Family Medicine

## 2015-03-08 DIAGNOSIS — G4731 Primary central sleep apnea: Secondary | ICD-10-CM

## 2015-03-08 NOTE — Telephone Encounter (Signed)
Call pt: she has done great with wearing her CPAP!!  AHI is under 4 so she is at goal with her CPAP.  Continue with 11 cm water pressure.

## 2015-03-09 NOTE — Telephone Encounter (Signed)
Notified patient of information and to continue with 11 cm water pressure.

## 2015-06-18 ENCOUNTER — Ambulatory Visit: Payer: 59 | Admitting: Family Medicine

## 2015-06-29 ENCOUNTER — Other Ambulatory Visit: Payer: Self-pay

## 2015-06-29 ENCOUNTER — Encounter: Payer: Self-pay | Admitting: Family Medicine

## 2015-06-29 ENCOUNTER — Ambulatory Visit (INDEPENDENT_AMBULATORY_CARE_PROVIDER_SITE_OTHER): Payer: 59 | Admitting: Family Medicine

## 2015-06-29 VITALS — BP 129/61 | HR 62 | Wt 246.0 lb

## 2015-06-29 DIAGNOSIS — M25562 Pain in left knee: Secondary | ICD-10-CM

## 2015-06-29 DIAGNOSIS — E8989 Other postprocedural endocrine and metabolic complications and disorders: Secondary | ICD-10-CM | POA: Diagnosis not present

## 2015-06-29 DIAGNOSIS — L237 Allergic contact dermatitis due to plants, except food: Secondary | ICD-10-CM | POA: Diagnosis not present

## 2015-06-29 DIAGNOSIS — I890 Lymphedema, not elsewhere classified: Secondary | ICD-10-CM

## 2015-06-29 DIAGNOSIS — Z6835 Body mass index (BMI) 35.0-35.9, adult: Secondary | ICD-10-CM

## 2015-06-29 MED ORDER — METHYLPREDNISOLONE SODIUM SUCC 125 MG IJ SOLR
125.0000 mg | Freq: Once | INTRAMUSCULAR | Status: AC
  Administered 2015-06-29: 125 mg via INTRAMUSCULAR

## 2015-06-29 MED ORDER — LIRAGLUTIDE -WEIGHT MANAGEMENT 18 MG/3ML ~~LOC~~ SOPN: 0.6000 mg | PEN_INJECTOR | Freq: Every day | SUBCUTANEOUS | Status: DC

## 2015-06-29 MED ORDER — PREDNISONE 20 MG PO TABS: 40.0000 mg | ORAL_TABLET | Freq: Every day | ORAL | Status: DC

## 2015-06-29 MED ORDER — BETAMETHASONE DIPROPIONATE 0.05 % EX CREA: TOPICAL_CREAM | Freq: Two times a day (BID) | CUTANEOUS | Status: DC

## 2015-06-29 MED ORDER — AMBULATORY NON FORMULARY MEDICATION: Status: DC

## 2015-06-29 NOTE — Addendum Note (Signed)
Addended by: Teddy Spike on: 06/29/2015 04:49 PM   Modules accepted: Orders

## 2015-06-29 NOTE — Progress Notes (Signed)
Subjective:    CC: Poison ivy  HPI:  Poison ivy rash. She was exposed 3 days ago.  She says typically she gets a very straight risk reaction when she gets exposed. She was mowing a neighbor's yard to help them out and thinks that's when it happened. Right now it's primarily affecting her left arm. She's asking for maximal therapy including a shot, pills, and ointment. Next  Lymphedema of right arm status post mastectomy-she needs a perception for a new sleeve for compression.  Left knee pain - she complains of intermittent pain. It's worse when she mows the yard. Right now she's not exercising regularly. She says she tried to find an over-the-counter brace but can't find one to fit.  Obesity-she did check with her insurance will not cover any of the new weight loss medications. She would like to see if she uses a coupon card if it would still be reasonable or not.  Past medical history, Surgical history, Family history not pertinant except as noted below, Social history, Allergies, and medications have been entered into the medical record, reviewed, and corrections made.   Review of Systems: No fevers, chills, night sweats, weight loss, chest pain, or shortness of breath.   Objective:    General: Well Developed, well nourished, and in no acute distress.  Neuro: Alert and oriented x3, extra-ocular muscles intact, sensation grossly intact.  HEENT: Normocephalic, atraumatic  Skin: Warm and dry, no rashes. Cardiac: Regular rate and rhythm, no murmurs rubs or gallops, no lower extremity edema.  Respiratory: Clear to auscultation bilaterally. Not using accessory muscles, speaking in full sentences.  Left knee with some swelling over the patella bursa. Some crepitus with flexion and extension. Very tender along the medial joint line. Increased laxity. Out of 5 strength of the knee and ankle.   Impression and Recommendations:    Poison ivy-we'll go ahead and treat with Solu-Medrol IM, oral  prednisone, and topical steroid cream. Call if not better in 1-2 weeks. Next  Lymphedema of right arm-given new prescription for new compression sleeve. Next  Left knee pain-we'll fit with brace for support. If not improving then recommend that she consider seeing one of her sports medicine doctors for further evaluation. Next  Obesity/abnormal weight gain-discussed several options. We will try the Sexenda with coupon card to see if it's covered. If not then consider metformin as back up plan.  Continue to work on strategies to get to the gym more regularly and work on diet and exercise.

## 2015-07-05 ENCOUNTER — Telehealth: Payer: Self-pay | Admitting: *Deleted

## 2015-07-05 NOTE — Telephone Encounter (Signed)
Pt lvm stating that her insurance will not cover the saxenda the cost is $1300 she would like to try the metformin for weight loss. [facility] to [facility].Patricia Waters [place]

## 2015-07-06 MED ORDER — METFORMIN HCL 500 MG PO TABS: 500.0000 mg | ORAL_TABLET | Freq: Two times a day (BID) | ORAL | Status: DC

## 2015-07-06 NOTE — Telephone Encounter (Signed)
New Rx sent for metformin.

## 2015-08-31 ENCOUNTER — Other Ambulatory Visit: Payer: Self-pay | Admitting: Family Medicine

## 2015-09-06 ENCOUNTER — Encounter: Payer: Self-pay | Admitting: Family Medicine

## 2015-09-06 ENCOUNTER — Telehealth: Payer: Self-pay | Admitting: Family Medicine

## 2015-09-06 ENCOUNTER — Ambulatory Visit (INDEPENDENT_AMBULATORY_CARE_PROVIDER_SITE_OTHER): Payer: 59 | Admitting: Family Medicine

## 2015-09-06 VITALS — BP 125/64 | HR 66 | Wt 239.0 lb

## 2015-09-06 DIAGNOSIS — R5383 Other fatigue: Secondary | ICD-10-CM

## 2015-09-06 DIAGNOSIS — I890 Lymphedema, not elsewhere classified: Secondary | ICD-10-CM

## 2015-09-06 DIAGNOSIS — G4733 Obstructive sleep apnea (adult) (pediatric): Secondary | ICD-10-CM | POA: Diagnosis not present

## 2015-09-06 DIAGNOSIS — R635 Abnormal weight gain: Secondary | ICD-10-CM

## 2015-09-06 MED ORDER — AMBULATORY NON FORMULARY MEDICATION: 0 refills | Status: DC

## 2015-09-06 NOTE — Telephone Encounter (Signed)
Left message for patient to call office for recommendations.

## 2015-09-06 NOTE — Progress Notes (Signed)
Subjective:    CC:   HPI: Abnormal weight gain-unfortunately her insurance was not going to cover the [place]. She is now on metformin for weight loss.  She has lost 7 pounds. She's now to home to a BMI of 38.  She is really making some good progress. She's been cutting out her snacking at night. She's been going to the gym 3 days per week. She's also been taking the metformin. She's not had any side effects. She says she hasn't really noticed a big difference but her partner who is here with her today says she's actually noticed that she's not finishing eating her plate. She feels like she's doing a good job drinking water  He has noticed that she is a little bit more tired than usual. She still been very physically active but just feels like she needs to rest more often. No shortness of breath or breathing problems. She wonders if her CPAP might need to be adjusted again. She is currently set on 11 cm of water pressure. Next  She needs referral back to the lymphedema clinic at the rehabilitation center on [address] in [place]. She needs to be fitted for a new compression sleeve and would like to have it before she goes on her trip to [place] in September.  Past medical history, Surgical history, Family history not pertinant except as noted below, Social history, Allergies, and medications have been entered into the medical record, reviewed, and corrections made.   Review of Systems: No fevers, chills, night sweats, weight loss, chest pain, or shortness of breath.   Objective:    General: Well Developed, well nourished, and in no acute distress.  Neuro: Alert and oriented x3, extra-ocular muscles intact, sensation grossly intact.  HEENT: Normocephalic, atraumatic  Skin: Warm and dry, no rashes. Cardiac: Regular rate and rhythm, no murmurs rubs or gallops, no lower extremity edema.  Respiratory: Clear to auscultation bilaterally. Not using accessory muscles, speaking in full  sentences.   Impression and Recommendations:   Abnormal weight gain/BMI 38- we'll continue with lower dose metformin. Continue with regular exercise. [facility] Encouraged her to continue with cutting back on portion sizes. Could also consider starting to calorie count daily if she is noticing that her weight is plateauing a little bit more. Otherwise I'll see her back in October for her physical. Next  Lymphedema secondary to meniscectomy-referral placed to get her back into the lymphedema clinic and get her a new compression sleeve.  Fatigue-will call [facility] and see if they can do a download off of her CPAP machine to see if we need to adjust her pressure.  OSA - see note above.

## 2015-09-06 NOTE — Telephone Encounter (Signed)
Call patient: I received her download and compliance report for her CPAP from [facility]. Her apnea hypotony index is actually 2.2 which means that it looks like her CPAP is actually working really well. She also had very minimal leakage. Her fatigue may be coming from something else. It has been a year and a half since we last checked a blood count. Certainly we can order a CBC and a CMP just to take a look at some basics. He can also check her thyroid since it's been a couple years since we have looked at that as well. Please see if she wants to do the additional blood work.

## 2015-09-07 NOTE — Telephone Encounter (Signed)
Left message advising of recommendations and for a return call.

## 2015-09-07 NOTE — Telephone Encounter (Signed)
Pt returned call yesterday.

## 2015-09-07 NOTE — Addendum Note (Signed)
Addended by: Narda Rutherford on: 09/07/2015 01:38 PM   Modules accepted: Orders

## 2015-09-07 NOTE — Telephone Encounter (Signed)
Patient advised and agreed. Labs ordered.

## 2015-09-08 LAB — CBC WITH DIFFERENTIAL/PLATELET
BASOS ABS: 0 {cells}/uL (ref 0–200)
Basophils Relative: 0 %
EOS PCT: 1 %
Eosinophils Absolute: 98 cells/uL (ref 15–500)
HCT: 37.2 % (ref 35.0–45.0)
Hemoglobin: 12.3 g/dL (ref 11.7–15.5)
Lymphocytes Relative: 25 %
Lymphs Abs: 2450 cells/uL (ref 850–3900)
MCH: 30.7 pg (ref 27.0–33.0)
MCHC: 33.1 g/dL (ref 32.0–36.0)
MCV: 92.8 fL (ref 80.0–100.0)
MONOS PCT: 7 %
MPV: 10.4 fL (ref 7.5–12.5)
Monocytes Absolute: 686 cells/uL (ref 200–950)
NEUTROS ABS: 6566 {cells}/uL (ref 1500–7800)
Neutrophils Relative %: 67 %
PLATELETS: 310 10*3/uL (ref 140–400)
RBC: 4.01 MIL/uL (ref 3.80–5.10)
RDW: 12.9 % (ref 11.0–15.0)
WBC: 9.8 10*3/uL (ref 3.8–10.8)

## 2015-09-08 LAB — TSH: TSH: 2.88 mIU/L

## 2015-09-09 LAB — COMPLETE METABOLIC PANEL WITH GFR
ALBUMIN: 4.2 g/dL (ref 3.6–5.1)
ALK PHOS: 121 U/L (ref 33–130)
ALT: 11 U/L (ref 6–29)
AST: 17 U/L (ref 10–35)
BILIRUBIN TOTAL: 0.6 mg/dL (ref 0.2–1.2)
BUN: 13 mg/dL (ref 7–25)
CALCIUM: 10 mg/dL (ref 8.6–10.4)
CO2: 27 mmol/L (ref 20–31)
Chloride: 102 mmol/L (ref 98–110)
Creat: 0.9 mg/dL (ref 0.50–1.05)
GFR, EST NON AFRICAN AMERICAN: 70 mL/min (ref >=60)
GFR, Est African American: 81 mL/min (ref >=60)
GLUCOSE: 93 mg/dL (ref 65–99)
POTASSIUM: 4.5 mmol/L (ref 3.5–5.3)
SODIUM: 139 mmol/L (ref 135–146)
TOTAL PROTEIN: 6.4 g/dL (ref 6.1–8.1)

## 2015-09-09 NOTE — Telephone Encounter (Signed)
All labs are normal. 

## 2015-09-13 ENCOUNTER — Ambulatory Visit (INDEPENDENT_AMBULATORY_CARE_PROVIDER_SITE_OTHER): Payer: 59 | Admitting: Obstetrics & Gynecology

## 2015-09-13 ENCOUNTER — Encounter: Payer: Self-pay | Admitting: Obstetrics & Gynecology

## 2015-09-13 VITALS — BP 124/74 | HR 59 | Ht 66.0 in | Wt 241.0 lb

## 2015-09-13 DIAGNOSIS — N952 Postmenopausal atrophic vaginitis: Secondary | ICD-10-CM | POA: Diagnosis not present

## 2015-09-14 ENCOUNTER — Ambulatory Visit: Payer: 59 | Attending: Family Medicine | Admitting: Physical Therapy

## 2015-09-14 DIAGNOSIS — I890 Lymphedema, not elsewhere classified: Secondary | ICD-10-CM | POA: Insufficient documentation

## 2015-09-14 NOTE — Therapy (Signed)
[place], [place], [address] Phone: [phone]   Fax:  [phone]  Physical Therapy Evaluation  Patient Details  Name: Patricia Waters MRN: [id number] Date of Birth: 11/27/1955 No Data Recorded  Encounter Date: 09/14/2015      PT End of Session - 09/14/15 1730    Visit Number 1   Number of Visits 1   PT Start Time [id number]   PT Stop Time 1520   PT Time Calculation (min) 43 min      Past Medical History:  Diagnosis Date  . Breast cancer ([place])   . Sleep apnea   . Vertigo     Past Surgical History:  Procedure Laterality Date  . BREAST LUMPECTOMY  1994   right  . CERVICAL FUSION  1995   C5-C7  . MASTECTOMY  2005   Bilat    There were no vitals filed for this visit.       Subjective Assessment - 09/14/15 1438    Subjective Patient and her mother have both had treatment here before.  Has the manual lymph drainage video.  Is going to [place] soon and wants to make sure she has compression that fits.  Trip is 9/12-27.   Patient is accompained by: Family member  wife, [name]   Pertinent History Pt. has been here before and had bandaging and video.   Currently in Pain? No/denies            [facility] PT Assessment - 09/14/15 0001      Assessment   Medical Diagnosis right arm lymphedema     Precautions   Precautions Other (comment)   Precaution Comments cancer precautions     Restrictions   Weight Bearing Restrictions No     Balance Screen   Has the patient fallen in the past 6 months No   Has the patient had a decrease in activity level because of a fear of falling?  No   Is the patient reluctant to leave their home because of a fear of falling?  No     Home Ecologist residence   Living Arrangements Spouse/significant other     Prior Function   Level of Independence Independent   Leisure active with yardwork in the summertime     Cognition    Overall Cognitive Status Within Functional Limits for tasks assessed     Posture/Postural Control   Posture/Postural Control Postural limitations   Postural Limitations Rounded Shoulders;Forward head     Ambulation/Gait   Ambulation/Gait Yes   Ambulation/Gait Assistance 7: Independent           LYMPHEDEMA/ONCOLOGY QUESTIONNAIRE - 09/14/15 1451      What other symptoms do you have   Are you having pitting edema Yes  at hand     Lymphedema Assessments   Lymphedema Assessments Upper extremities     Right Upper Extremity Lymphedema   15 cm Proximal to Olecranon Process 41.7 cm   10 cm Proximal to Olecranon Process 37.5 cm   Olecranon Process 28.1 cm   15 cm Proximal to Ulnar Styloid Process 29.2 cm   10 cm Proximal to Ulnar Styloid Process 27.1 cm   Just Proximal to Ulnar Styloid Process 20.6 cm   Across Hand at [employer] 20.7 cm   At [place] of 2nd Digit 6.9 cm     Left Upper Extremity Lymphedema   15 cm Proximal to Olecranon Process 38.6 cm  10 cm Proximal to Olecranon Process 36.3 cm   Olecranon Process 27.5 cm   15 cm Proximal to Ulnar Styloid Process 28.5 cm   10 cm Proximal to Ulnar Styloid Process 26.2 cm   Just Proximal to Ulnar Styloid Process 19.2 cm   Across Hand at [employer] 19.8 cm   At [place] of 2nd Digit 6.5 cm                        PT Education - 09/14/15 1730    Education provided Yes   Education Details options for where and how to obtain a new compression sleeve and glove, as well as which type is recommended for her   Person(s) Educated Patient;Spouse   Methods Explanation   Comprehension Verbalized understanding                [place] - 09/14/15 1739      CC Long Term Goal  #1   Title patient will be knowledgeable about where and how to obtain a compression sleeve and glove, and which type is recommended   Status Achieved            Plan - 09/14/15 1731    Clinical Impression Statement  Patient who has been seen in this clinic previously for right UE lymphedema returns now mainly because she will take a trip to [place] soon and wants a new, better-fitting compression sleeve.  She has gained weight since last sleeve was fitted and indeed it was found not to fit well when tried today.  Her right UE lymphedema is visible yet fairly mild, and she reports that it fluctuates some, but overall has been fairly stable. She has not used her compression sleeve nor glove much at all.  She is not interested in other treatment.     Rehab Potential Good   PT Frequency One time visit   PT Treatment/Interventions ADLs/Self Care Home Management;Patient/family education;DME Instruction   PT Next Visit Plan None at this time; will connect patient with fitter for compression sleeve and glove.  I am recommending a Jobst Bella Strong sleeve for her to give her an off-the-shelf version that will provide decent containment because of its firm fabric and decent fit by virtue of the wide range of sizes available.   Consulted and Agree with Plan of Care Patient      Patient will benefit from skilled therapeutic intervention in order to improve the following deficits and impairments:  Decreased knowledge of use of DME, Increased edema  Visit Diagnosis: Lymphedema, not elsewhere classified - Plan: PT plan of care cert/re-cert     Problem List Patient Active Problem List   Diagnosis Date Noted  . History of breast cancer in female 07/21/2014  . Family history of breast cancer in first degree relative 07/21/2014  . Breast cancer, left breast ([place]) 01/20/2014  . Lymphedema 10/23/2013  . Obesity (BMI 30-39.9) 09/25/2013  . Periodic limb movement 06/13/2013  . Plantar fasciitis, bilateral 06/04/2013  . Obesity, Class II, BMI 35-39.9 03/19/2013  . RLS (restless legs syndrome) 03/19/2013  . Central sleep apnea 03/19/2013  . Sleep apnea, obstructive 03/19/2013  . IFG (impaired fasting glucose) 12/14/2011   . 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 POSTMENOPAUSAL STATUS 01/10/2010  . BENIGN POSITIONAL VERTIGO 11/08/2009  . SPINAL STENOSIS, LUMBAR 07/06/2009  . OTH NONSPC ABN FINDNG RAD&OTH EXM BODY STRUCTURE 03/30/2009  . BACK PAIN 04/23/2007    SALISBURY,DONNA  09/14/2015, 5:43 PM  [place], [place], [address] Phone: [phone]   Fax:  [phone]  Name: TASHERA RUTH MRN: [id number] Date of Birth: Aug 12, 1955  Serafina Royals, PT 09/14/15 5:43 PM

## 2015-09-15 NOTE — Progress Notes (Signed)
   Subjective:    Patient ID: Patricia Waters, female    DOB: 04/21/1955, 60 y.o.   MRN: [id number]  HPI  60 yo female presents wondering if her endocervical polyp had returned.  She thought that she had seen something on her vulva.  It is no longer there.  Pt denies vaginal bleeding, vulvar itching, vaginal discharge.   Review of Systems  Constitutional: Negative.   Respiratory: Negative.   Cardiovascular: Negative.   Gastrointestinal: Negative.   Genitourinary: Negative.   Psychiatric/Behavioral: Negative.        Objective:   Physical Exam  Constitutional: She is oriented to person, place, and time. She appears well-developed and well-nourished. No distress.  HENT:  Head: Normocephalic and atraumatic.  Eyes: Conjunctivae are normal.  Pulmonary/Chest: Effort normal.  Abdominal: Soft. Bowel sounds are normal. She exhibits no distension and no mass. There is no tenderness. There is no rebound and no guarding.  Genitourinary:  Genitourinary Comments: Vulva:  No lesion, [place] V Vagina:  Atrophic, no lesion Cervix:  No lesion, no polyp  Musculoskeletal: She exhibits no edema.  Neurological: She is alert and oriented to person, place, and time.  Skin: Skin is warm and dry.  Psychiatric: She has a normal mood and affect.  Vitals reviewed.     Assessment & Plan:  60 yo female with normal exam  Pt reassured Atrohpic vagina but does not wish to be treated. Routine health maintenance with PCP Pap up to date with

## 2015-10-25 ENCOUNTER — Encounter: Payer: 59 | Admitting: Family Medicine

## 2015-10-27 ENCOUNTER — Other Ambulatory Visit: Payer: Self-pay | Admitting: Family Medicine

## 2015-11-05 ENCOUNTER — Encounter: Payer: 59 | Admitting: Family Medicine

## 2015-11-11 ENCOUNTER — Encounter: Payer: 59 | Admitting: Family Medicine

## 2015-11-25 ENCOUNTER — Other Ambulatory Visit: Payer: Self-pay | Admitting: Family Medicine

## 2015-11-25 ENCOUNTER — Encounter: Payer: Self-pay | Admitting: Family Medicine

## 2015-11-25 ENCOUNTER — Ambulatory Visit (INDEPENDENT_AMBULATORY_CARE_PROVIDER_SITE_OTHER): Payer: 59 | Admitting: Family Medicine

## 2015-11-25 VITALS — BP 135/64 | HR 65 | Ht 66.0 in | Wt 240.0 lb

## 2015-11-25 DIAGNOSIS — Z0000 Encounter for general adult medical examination without abnormal findings: Secondary | ICD-10-CM | POA: Diagnosis not present

## 2015-11-25 DIAGNOSIS — Z853 Personal history of malignant neoplasm of breast: Secondary | ICD-10-CM

## 2015-11-25 DIAGNOSIS — R222 Localized swelling, mass and lump, trunk: Secondary | ICD-10-CM

## 2015-11-25 DIAGNOSIS — Z8349 Family history of other endocrine, nutritional and metabolic diseases: Secondary | ICD-10-CM | POA: Diagnosis not present

## 2015-11-25 DIAGNOSIS — Z9013 Acquired absence of bilateral breasts and nipples: Secondary | ICD-10-CM

## 2015-11-25 LAB — LIPID PANEL
CHOL/HDL RATIO: 3.6 ratio (ref <=5.0)
CHOLESTEROL: 188 mg/dL (ref 125–200)
HDL: 52 mg/dL (ref >=46)
LDL Cholesterol: 108 mg/dL (ref <130)
TRIGLYCERIDES: 140 mg/dL (ref <150)
VLDL: 28 mg/dL (ref <30)

## 2015-11-25 LAB — IRON AND TIBC
%SAT: 25 % (ref 11–50)
IRON: 85 ug/dL (ref 45–160)
TIBC: 345 ug/dL (ref 250–450)
UIBC: 260 ug/dL (ref 125–400)

## 2015-11-25 LAB — COMPLETE METABOLIC PANEL WITH GFR
ALBUMIN: 4.1 g/dL (ref 3.6–5.1)
ALK PHOS: 111 U/L (ref 33–130)
ALT: 14 U/L (ref 6–29)
AST: 20 U/L (ref 10–35)
BUN: 13 mg/dL (ref 7–25)
CALCIUM: 9.6 mg/dL (ref 8.6–10.4)
CO2: 24 mmol/L (ref 20–31)
Chloride: 102 mmol/L (ref 98–110)
Creat: 0.81 mg/dL (ref 0.50–1.05)
GFR, Est Non African American: 80 mL/min (ref >=60)
Glucose, Bld: 99 mg/dL (ref 65–99)
POTASSIUM: 4.5 mmol/L (ref 3.5–5.3)
Sodium: 139 mmol/L (ref 135–146)
Total Bilirubin: 0.4 mg/dL (ref 0.2–1.2)
Total Protein: 6.9 g/dL (ref 6.1–8.1)

## 2015-11-25 LAB — FERRITIN: Ferritin: 89 ng/mL (ref 10–232)

## 2015-11-25 LAB — POCT GLYCOSYLATED HEMOGLOBIN (HGB A1C): Hemoglobin A1C: 5.4

## 2015-11-25 MED ORDER — ROPINIROLE HCL 0.5 MG PO TABS: 0.5000 mg | ORAL_TABLET | Freq: Every day | ORAL | 2 refills | Status: DC

## 2015-11-25 NOTE — Progress Notes (Addendum)
Subjective:     Patricia Waters is a 60 y.o. female and is here for a comprehensive physical exam. The patient reports no problems.  Social History   Social History  . Marital status: Married    Spouse name: [name]  . Number of children: N/A  . Years of education: N/A   Occupational History  . COURIER [employer]   Social History Main Topics  . Smoking status: Former Smoker    Types: Cigarettes    Quit date: 10/31/2006  . Smokeless tobacco: Never Used  . Alcohol use No  . Drug use: No  . Sexual activity: Not Currently    Partners: Female    Birth control/ protection: None   Other Topics Concern  . Not on file   Social History Narrative   Lives with her partner, [name]. Former Therapist, nutritional.    Health Maintenance  Topic Date Due  . Hepatitis C Screening  30-Dec-1955  . HIV Screening  06/21/1971  . PAP SMEAR  05/13/2016  . COLONOSCOPY  01/30/2017  . TETANUS/TDAP  10/17/2018  . INFLUENZA VACCINE  Addressed    The following portions of the patient's history were reviewed and updated as appropriate: allergies, current medications, past family history, past medical history, past social history, past surgical history and problem list.  Review of Systems A comprehensive review of systems was negative.   Objective:    There were no vitals taken for this visit. General appearance: alert, cooperative and appears stated age Head: Normocephalic, without obvious abnormality, atraumatic Eyes: conj clear, EOMI, PEERLA Ears: normal TM's and external ear canals both ears Nose: Nares normal. Septum midline. Mucosa normal. No drainage or sinus tenderness. Throat: lips, mucosa, and tongue normal; teeth and gums normal Neck: no adenopathy, no carotid bruit, no JVD, supple, symmetrical, trachea midline and thyroid not enlarged, symmetric, no tenderness/mass/nodules Back: symmetric, no curvature. ROM normal. No CVA tenderness. Lungs: clear to auscultation  bilaterally Breasts: prior history of mastectomy. felt small soft approx 1.5 x 2 cm lump on the left out breast area toward the axilla about 4 cm above scar edge Heart: regular rate and rhythm, S1, S2 normal, no murmur, click, rub or gallop Abdomen: soft, non-tender; bowel sounds normal; no masses,  no organomegaly Extremities: extremities normal, atraumatic, no cyanosis or edema Pulses: 2+ and symmetric Skin: Skin color, texture, turgor normal. No rashes or lesions Lymph nodes: Cervical, supraclavicular, and axillary nodes normal. Neurologic: Alert and oriented X 3, normal strength and tone. Normal symmetric reflexes. Normal coordination and gait    Assessment:    Healthy female exam.     Plan:     See After Visit Summary for Counseling Recommendations   Keep up a regular exercise program and make sure you are eating a healthy diet Try to eat 4 servings of dairy a day, or if you are lactose intolerant take a calcium with vitamin D daily.  Your vaccines are up to date.   Chest wall lump - May just be scar tissue versus a lymph node but because of her prior history of like to get an ultrasound just for further evaluation. Will schedule at the breast center in [facility] [address]  Mother is a heterozygote for hemachromatosis. She would like to be evaluated as well. Iron studies and hemachromatosis PCR ordered.

## 2015-11-25 NOTE — Patient Instructions (Signed)
Keep up a regular exercise program and make sure you are eating a healthy diet Try to eat 4 servings of dairy a day, or if you are lactose intolerant take a calcium with vitamin D daily.  Your vaccines are up to date.   

## 2015-11-29 ENCOUNTER — Other Ambulatory Visit: Payer: Self-pay

## 2015-11-29 DIAGNOSIS — Z0000 Encounter for general adult medical examination without abnormal findings: Secondary | ICD-10-CM

## 2015-11-29 LAB — TRANSFERRIN: Transferrin: 245 mg/dL (ref 188–341)

## 2015-11-29 LAB — HEMOCHROMATOSIS DNA-PCR(C282Y,H63D)

## 2015-11-29 LAB — HEPATITIS C ANTIBODY: HCV Ab: NEGATIVE

## 2015-11-29 MED ORDER — ROPINIROLE HCL 0.5 MG PO TABS: 0.5000 mg | ORAL_TABLET | Freq: Every day | ORAL | 2 refills | Status: DC

## 2015-12-03 ENCOUNTER — Ambulatory Visit: Admission: RE | Admit: 2015-12-03 | Payer: 59 | Source: Ambulatory Visit

## 2015-12-03 ENCOUNTER — Ambulatory Visit
Admission: RE | Admit: 2015-12-03 | Discharge: 2015-12-03 | Disposition: A | Discharge status: Home / Self Care | Payer: 59 | Source: Ambulatory Visit | Attending: Family Medicine | Admitting: Family Medicine

## 2015-12-03 ENCOUNTER — Other Ambulatory Visit: Payer: Self-pay | Admitting: Family Medicine

## 2015-12-03 DIAGNOSIS — R222 Localized swelling, mass and lump, trunk: Secondary | ICD-10-CM

## 2016-01-27 ENCOUNTER — Other Ambulatory Visit: Payer: Self-pay | Admitting: Family Medicine

## 2016-02-29 ENCOUNTER — Other Ambulatory Visit: Payer: Self-pay | Admitting: Family Medicine

## 2016-05-25 ENCOUNTER — Encounter: Payer: Self-pay | Admitting: Family Medicine

## 2016-05-25 ENCOUNTER — Ambulatory Visit (INDEPENDENT_AMBULATORY_CARE_PROVIDER_SITE_OTHER): Payer: 59 | Admitting: Family Medicine

## 2016-05-25 VITALS — BP 119/55 | HR 62 | Ht 66.0 in | Wt 238.0 lb

## 2016-05-25 DIAGNOSIS — R7301 Impaired fasting glucose: Secondary | ICD-10-CM | POA: Diagnosis not present

## 2016-05-25 DIAGNOSIS — R222 Localized swelling, mass and lump, trunk: Secondary | ICD-10-CM | POA: Diagnosis not present

## 2016-05-25 DIAGNOSIS — G4733 Obstructive sleep apnea (adult) (pediatric): Secondary | ICD-10-CM | POA: Diagnosis not present

## 2016-05-25 DIAGNOSIS — G2581 Restless legs syndrome: Secondary | ICD-10-CM | POA: Diagnosis not present

## 2016-05-25 LAB — POCT GLYCOSYLATED HEMOGLOBIN (HGB A1C): HEMOGLOBIN A1C: 5.4

## 2016-05-25 MED ORDER — METFORMIN HCL 500 MG PO TABS: 500.0000 mg | ORAL_TABLET | Freq: Two times a day (BID) | ORAL | 3 refills | Status: DC

## 2016-05-25 MED ORDER — GABAPENTIN 300 MG PO CAPS: 600.0000 mg | ORAL_CAPSULE | Freq: Every day | ORAL | 2 refills | Status: DC

## 2016-05-25 NOTE — Progress Notes (Signed)
Subjective:    CC: IFG  HPI:  Impaired fasting glucose-no increased thirst or urination. No symptoms consistent with hypoglycemia.She still not exercising regularly.  RLS - ON requip 0.5 mg at bedtime.  She feels like it's working well and has not had any side effects or complaints.  OSA - Doing well. Feeling like her mask is fitting well.Also got a new bed recently and that has helped her sleep better too.   Left chest wall mass from prior scarn tissue - due for repeat US in June.    Past medical history, Surgical history, Family history not pertinant except as noted below, Social history, Allergies, and medications have been entered into the medical record, reviewed, and corrections made.   Review of Systems: No fevers, chills, night sweats, weight loss, chest pain, or shortness of breath.   Objective:    General: Well Developed, well nourished, and in no acute distress.  Neuro: Alert and oriented x3, extra-ocular muscles intact, sensation grossly intact.  HEENT: Normocephalic, atraumatic  Skin: Warm and dry, no rashes. Cardiac: Regular rate and rhythm, no murmurs rubs or gallops, no lower extremity edema.  Respiratory: Clear to auscultation bilaterally. Not using accessory muscles, speaking in full sentences.   Impression and Recommendations:    IFG - A1C of 5.4 today.  STable. Discussed trying to get some exercise and daily. Encouraged her to start with 10 minutes of walking daily and work up to a goal of 30 minutes a day. They recently moved to [facility] and says she'll have an opportunity to have a lot of sidewalks and safe area to walk around her neighborhood. Recheck in 6 months.    Left chest wall mass from prior scarn tissue - due for repeat US in June.    OSA- Stable and doing well.    RLS - well controlled.

## 2016-06-28 ENCOUNTER — Other Ambulatory Visit: Payer: Self-pay | Admitting: Family Medicine

## 2016-06-28 DIAGNOSIS — Z9013 Acquired absence of bilateral breasts and nipples: Secondary | ICD-10-CM

## 2016-06-28 DIAGNOSIS — N6320 Unspecified lump in the left breast, unspecified quadrant: Secondary | ICD-10-CM

## 2016-07-11 ENCOUNTER — Ambulatory Visit
Admission: RE | Admit: 2016-07-11 | Discharge: 2016-07-11 | Disposition: A | Discharge status: Home / Self Care | Payer: 59 | Source: Ambulatory Visit | Attending: Family Medicine | Admitting: Family Medicine

## 2016-07-11 DIAGNOSIS — N6320 Unspecified lump in the left breast, unspecified quadrant: Secondary | ICD-10-CM

## 2016-07-11 DIAGNOSIS — Z9013 Acquired absence of bilateral breasts and nipples: Secondary | ICD-10-CM

## 2016-07-20 ENCOUNTER — Other Ambulatory Visit: Payer: Self-pay | Admitting: *Deleted

## 2016-07-20 MED ORDER — METFORMIN HCL 500 MG PO TABS: 500.0000 mg | ORAL_TABLET | Freq: Two times a day (BID) | ORAL | 3 refills | Status: DC

## 2016-08-21 ENCOUNTER — Other Ambulatory Visit: Payer: Self-pay | Admitting: Family Medicine

## 2016-08-21 DIAGNOSIS — Z0000 Encounter for general adult medical examination without abnormal findings: Secondary | ICD-10-CM

## 2016-09-13 ENCOUNTER — Encounter: Payer: Self-pay | Admitting: Obstetrics & Gynecology

## 2016-09-13 ENCOUNTER — Ambulatory Visit (INDEPENDENT_AMBULATORY_CARE_PROVIDER_SITE_OTHER): Payer: 59 | Admitting: Obstetrics & Gynecology

## 2016-09-13 VITALS — BP 129/84 | HR 65 | Resp 16 | Ht 66.0 in | Wt 243.0 lb

## 2016-09-13 DIAGNOSIS — Z01419 Encounter for gynecological examination (general) (routine) without abnormal findings: Secondary | ICD-10-CM

## 2016-09-13 NOTE — Progress Notes (Signed)
Subjective:    Patricia Waters is a 61 y.o. female who presents for an annual exam. The patient has no complaints today. The patient is sexually active. GYN screening history: last pap: approximate date 2015 and was normal. The patient wears seatbelts: yes.  Partner is Chief Financial Officer.  Mom is patient of our practice. Pt had [name] of left axilla twice-6 months apart.  Cleared.      Menstrual History: OB History    Gravida Para Term Preterm AB Living   0 0 0 0 0 0   SAB TAB Ectopic Multiple Live Births   0 0 0 0 0       No LMP recorded. Patient is postmenopausal.    The following portions of the patient's history were reviewed and updated as appropriate: allergies, current medications, past family history, past medical history, past social history, past surgical history and problem list.  Review of Systems Pertinent items noted in HPI and remainder of comprehensive ROS otherwise negative.    Objective:      Vitals:   09/13/16 0949  BP: 129/84  Pulse: 65  Resp: 16  Weight: 243 lb (110.2 kg)  Height: 5\' 6"  (1.676 m)   Vitals:  WNL General appearance: alert, cooperative and no distress  HEENT: Normocephalic, without obvious abnormality, atraumatic Eyes: negative Throat: lips, mucosa, and tongue normal; teeth and gums normal  Respiratory: Clear to auscultation bilaterally  CV: Regular rate and rhythm  Breasts:  Normal appearance, no masses or tenderness, no nipple retraction or dimpling  GI: Soft, non-tender; bowel sounds normal; no masses,  no organomegaly  GU: External Genitalia:  Tanner V, no lesion Urethra:  No prolapse   Vagina: Pale pink, no blood or discharge  Cervix: No CMT, no lesion  Uterus:  Normal size and contour, non tender, exam limited by habitus  Adnexa: Normal, no masses, non tender, exam limited by habitus  Musculoskeletal: No edema, redness or tenderness in the calves or thighs  Skin: No lesions or rash  Lymphatic: Axillary adenopathy: none     Psychiatric:  Normal mood and behavior    .    Assessment:    Healthy female exam.    Plan:    Colonoscopy up to date per patient Pap with cotesting today.  Pt knows that she can go 5 years until next pap smear No mammograms due to bilateral mastectomy; self chest wall examinations.

## 2016-09-15 LAB — CYTOLOGY - PAP
Diagnosis: NEGATIVE
HPV: NOT DETECTED

## 2016-10-16 ENCOUNTER — Ambulatory Visit (INDEPENDENT_AMBULATORY_CARE_PROVIDER_SITE_OTHER): Payer: 59 | Admitting: Family Medicine

## 2016-10-16 VITALS — BP 118/53 | HR 64 | Temp 98.4°F | Ht 66.0 in | Wt 243.4 lb

## 2016-10-16 DIAGNOSIS — Z23 Encounter for immunization: Secondary | ICD-10-CM | POA: Diagnosis not present

## 2016-10-16 MED ORDER — ZOSTER VAC RECOMB ADJUVANTED 50 MCG/0.5ML IM SUSR: 0.5000 mL | Freq: Once | INTRAMUSCULAR | 0 refills | Status: DC

## 2016-10-16 NOTE — Progress Notes (Signed)
   Subjective:    Patient ID: Patricia Waters, female    DOB: Jul 26, 1955, 61 y.o.   MRN: [id number]  HPI Pt is here for flu and shingles vaccine. Pt denies egg allergy, fever, chest pain, SOB, or any other problems.    Review of Systems     Objective:   Physical Exam        Assessment & Plan:  Pt tolerated injections in Left deltoid well and without complications. Pt stated she could not receive injection in right arm.

## 2016-11-24 ENCOUNTER — Encounter: Payer: Self-pay | Admitting: Family Medicine

## 2016-11-24 ENCOUNTER — Ambulatory Visit (INDEPENDENT_AMBULATORY_CARE_PROVIDER_SITE_OTHER): Payer: 59 | Admitting: Family Medicine

## 2016-11-24 VITALS — BP 130/65 | HR 61 | Ht 66.0 in | Wt 239.0 lb

## 2016-11-24 DIAGNOSIS — Z6839 Body mass index (BMI) 39.0-39.9, adult: Secondary | ICD-10-CM | POA: Diagnosis not present

## 2016-11-24 DIAGNOSIS — G2581 Restless legs syndrome: Secondary | ICD-10-CM | POA: Diagnosis not present

## 2016-11-24 DIAGNOSIS — R7301 Impaired fasting glucose: Secondary | ICD-10-CM | POA: Diagnosis not present

## 2016-11-24 DIAGNOSIS — G4733 Obstructive sleep apnea (adult) (pediatric): Secondary | ICD-10-CM | POA: Diagnosis not present

## 2016-11-24 LAB — COMPLETE METABOLIC PANEL WITH GFR
AG Ratio: 1.6 (calc) (ref 1.0–2.5)
ALBUMIN MSPROF: 4.4 g/dL (ref 3.6–5.1)
ALT: 15 U/L (ref 6–29)
AST: 19 U/L (ref 10–35)
Alkaline phosphatase (APISO): 123 U/L (ref 33–130)
BUN: 9 mg/dL (ref 7–25)
CALCIUM: 10 mg/dL (ref 8.6–10.4)
CO2: 28 mmol/L (ref 20–32)
CREATININE: 0.78 mg/dL (ref 0.50–0.99)
Chloride: 100 mmol/L (ref 98–110)
GFR, EST AFRICAN AMERICAN: 96 mL/min/{1.73_m2} (ref >=60)
GFR, EST NON AFRICAN AMERICAN: 83 mL/min/{1.73_m2} (ref >=60)
GLOBULIN: 2.8 g/dL (ref 1.9–3.7)
Glucose, Bld: 89 mg/dL (ref 65–99)
Potassium: 4.5 mmol/L (ref 3.5–5.3)
SODIUM: 138 mmol/L (ref 135–146)
Total Bilirubin: 0.5 mg/dL (ref 0.2–1.2)
Total Protein: 7.2 g/dL (ref 6.1–8.1)

## 2016-11-24 LAB — LIPID PANEL W/REFLEX DIRECT LDL
CHOLESTEROL: 205 mg/dL — AB (ref <200)
HDL: 58 mg/dL (ref >50)
LDL Cholesterol (Calc): 118 mg/dL (calc) — ABNORMAL HIGH
NON-HDL CHOLESTEROL (CALC): 147 mg/dL — AB (ref <130)
Total CHOL/HDL Ratio: 3.5 (calc) (ref <5.0)
Triglycerides: 163 mg/dL — ABNORMAL HIGH (ref <150)

## 2016-11-24 LAB — POCT GLYCOSYLATED HEMOGLOBIN (HGB A1C): Hemoglobin A1C: 5.4

## 2016-11-24 NOTE — Progress Notes (Signed)
Subjective:    CC: IFG and sleep apnea.   HPI:  Impaired fasting glucose-no increased thirst or urination. No symptoms consistent with hypoglycemia. She has not been able to exercise consistently.  RLS - all she's doing well with ropinirole. She feels like it's effective and is working well.  BMI of 39-weight is stable at 243 pounds over the last 4 weeks but up about 3 pounds compared to a year ago. She has not been able to exercise as consistently as her partner Kevin Fenton recently had surgery again and says she's having to stay with her around-the-clock to monitor blood sugar levels.   Obstructive sleep apnea-her partner says that she's noticed that it's been a little bit laterally lately. She's not sure if maybe she is opening her mouth. But she's been getting good readings on her out showing that she's not having any significant air leaks or pressure changes.  There were no vitals taken for this visit.    No Known Allergies  Past Medical History:  Diagnosis Date  . Breast cancer ([place])    at age 61  . Sleep apnea   . Vertigo     Past Surgical History:  Procedure Laterality Date  . BREAST LUMPECTOMY  1994   right  . CERVICAL FUSION  1995   C5-C7  . MASTECTOMY  2005   Bilat     Social History   Social History  . Marital status: Married    Spouse name: [name]  . Number of children: N/A  . Years of education: N/A   Occupational History  . COURIER [employer]   Social History Main Topics  . Smoking status: Former Smoker    Types: Cigarettes    Quit date: 60/01/2006  . Smokeless tobacco: Never Used  . Alcohol use No  . Drug use: No  . Sexual activity: Not Currently    Partners: Female    Birth control/ protection: None   Other Topics Concern  . Not on file   Social History Narrative   Lives with her partner, [name]. Former Therapist, nutritional.     Family History  Problem Relation Age of Onset  . Stroke Mother   . Hyperlipidemia Mother   . Skin  cancer Mother   . Breast cancer Mother   . Diabetes Father   . Hyperlipidemia Father   . Heart attack Father 78  . Colon cancer Father   . Cancer Father 41       prostate  . Diabetes Paternal Grandmother   . Heart attack Paternal Grandmother   . Rheum arthritis Paternal Grandmother   . Hyperlipidemia Sister   . Heart attack Paternal Grandfather   . Heart attack Maternal Grandfather   . Heart attack Maternal Grandmother   . Skin cancer Maternal Grandmother   . Rheum arthritis Maternal Grandmother   . Breast cancer Sister   . Lymphoma Maternal Aunt   . Kidney cancer Maternal Aunt     Outpatient Encounter Prescriptions as of 60/26/2018  Medication Sig  . AMBULATORY NON FORMULARY MEDICATION Medication Name: full orthotics for heel spurs and planta fasciitis.  . AMBULATORY NON FORMULARY MEDICATION Medication Name: compression sleeve for right arm and hand. DX:I89.0  . calcium carbonate (OS-CAL) 600 MG TABS Take 600 mg by mouth daily.    . cholecalciferol (VITAMIN D) 1000 UNITS tablet Take 1,000 Units by mouth daily.    . DULoxetine (CYMBALTA) 30 MG capsule TAKE 1 CAPSULE BY MOUTH  DAILY  .  fluticasone (FLONASE) 50 MCG/ACT nasal spray Place into both nostrils daily.  Marland Kitchen gabapentin (NEURONTIN) 300 MG capsule Take 2 capsules (600 mg total) by mouth daily.  Javier Docker Oil 1000 MG CAPS Take by mouth.  . metFORMIN (GLUCOPHAGE) 500 MG tablet Take 1 tablet (500 mg total) by mouth 2 (two) times daily with a meal.  . Multiple Vitamins-Minerals (VITEYES AREDS FORMULA PO) Take by mouth daily.  Marland Kitchen rOPINIRole (REQUIP) 0.5 MG tablet TAKE 1 TABLET BY MOUTH AT  BEDTIME  . vitamin C (ASCORBIC ACID) 500 MG tablet Take 500 mg by mouth daily.     No facility-administered encounter medications on file as of 60/26/2018.       Objective:    General: Well Developed, well nourished, and in no acute distress.  Neuro: Alert and oriented x3, extra-ocular muscles intact, sensation grossly intact.  HEENT:  Normocephalic, atraumatic  Skin: Warm and dry, no rashes. Cardiac: Regular rate and rhythm, no murmurs rubs or gallops, no lower extremity edema.  Respiratory: Clear to auscultation bilaterally. Not using accessory muscles, speaking in full sentences.   Impression and Recommendations:    IFG - stable. She is doing a great job. Will go ahead and finish out her current perception for metformin and we'll plan to come off and didn't see how her A1c does. We will originally added the metformin more to see if it would help curb her appetite and help with weight loss.  BMI 39-recommend that she try when the smart phone application slight my fitness pal or illicit to have set calorie goals. She said they have made some dietary changes recently at home such as increasing protein levels in hopes that this would help. Again just encourage her to be as active as she can I know it's been difficult for her to have a specific exercise schedule. F/U in 4 months.   Restless leg syndrome-continue with Requip at current dose. Follow-up in 6 months.  Obstructive sleep apnea-stable.

## 2016-12-15 ENCOUNTER — Ambulatory Visit (INDEPENDENT_AMBULATORY_CARE_PROVIDER_SITE_OTHER): Payer: 59 | Admitting: Family Medicine

## 2016-12-15 VITALS — BP 137/74 | HR 69 | Wt 245.0 lb

## 2016-12-15 DIAGNOSIS — Z23 Encounter for immunization: Secondary | ICD-10-CM

## 2016-12-15 NOTE — Progress Notes (Signed)
Agree with above.  Catherine Metheney, MD  

## 2016-12-15 NOTE — Progress Notes (Signed)
   Subjective:    Patient ID: Patricia Waters, female    DOB: 1956/01/20, 61 y.o.   MRN: [id number]  HPI  Patricia Waters is here for the 2 nd shingles vaccine.  Review of Systems     Objective:   Physical Exam        Assessment & Plan:  Need shingles vaccine - Patient tolerated injection well without complications.

## 2016-12-25 ENCOUNTER — Other Ambulatory Visit: Payer: Self-pay | Admitting: Family Medicine

## 2017-01-27 ENCOUNTER — Other Ambulatory Visit: Payer: Self-pay | Admitting: Family Medicine

## 2017-01-28 ENCOUNTER — Encounter: Payer: Self-pay | Admitting: Emergency Medicine

## 2017-01-28 ENCOUNTER — Emergency Department (INDEPENDENT_AMBULATORY_CARE_PROVIDER_SITE_OTHER): Payer: 59

## 2017-01-28 ENCOUNTER — Emergency Department
Admission: EM | Admit: 2017-01-28 | Discharge: 2017-01-28 | Disposition: A | Discharge status: Home / Self Care | Payer: 59 | Source: Home / Self Care | Attending: Family Medicine | Admitting: Family Medicine

## 2017-01-28 ENCOUNTER — Other Ambulatory Visit: Payer: Self-pay

## 2017-01-28 DIAGNOSIS — M25511 Pain in right shoulder: Secondary | ICD-10-CM

## 2017-01-28 MED ORDER — HYDROCODONE-ACETAMINOPHEN 5-325 MG PO TABS: 1.0000 | ORAL_TABLET | Freq: Four times a day (QID) | ORAL | 0 refills | Status: DC | PRN

## 2017-01-28 MED ORDER — PREDNISONE 20 MG PO TABS: ORAL_TABLET | ORAL | 0 refills | Status: DC

## 2017-01-28 NOTE — ED Triage Notes (Signed)
Reports 5 days of pain in right shoulder without known injury; has been doing heavy lifting; using ice and non-drowsy muscle relaxer.

## 2017-01-28 NOTE — ED Triage Notes (Signed)
Took ultram 150mg  at 1000.

## 2017-01-28 NOTE — ED Provider Notes (Signed)
[facility]    CSN: [id number] Arrival date & time: 01/28/17  1122     History   Chief Complaint Chief Complaint  Patient presents with  . Shoulder Pain    HPI Patricia Waters is a 61 y.o. female.   HPI  Patricia Waters is a 61 y.o. female presenting to UC with c/o 5 days of pain in her Right shoulder w/o known injury but does report doing heavy lifting of holiday decorations over the last 1-2 weeks.  Pain is aching and sore, 9/70, worse with certain movements such as overhead. She is Right hand dominant. Hx of lymphedema in Right arm due to mastectomy from breast cancer several years ago. She has not noticed any worsening swelling in her arm.     Past Medical History:  Diagnosis Date  . Breast cancer ([place])    at age 28  . Sleep apnea   . Vertigo     Patient Active Problem List   Diagnosis Date Noted  . Hx of bilateral mastectomy 11/25/2015  . History of breast cancer in female 07/21/2014  . Family history of breast cancer in first degree relative 07/21/2014  . Breast cancer, left breast ([place]) 01/20/2014  . Lymphedema 10/23/2013  . Obesity (BMI 30-39.9) 09/25/2013  . Periodic limb movement 06/13/2013  . Plantar fasciitis, bilateral 06/04/2013  . Obesity, Class II, BMI 35-39.9 03/19/2013  . RLS (restless legs syndrome) 03/19/2013  . Central sleep apnea 03/19/2013  . Sleep apnea, obstructive 03/19/2013  . IFG (impaired fasting glucose) 12/14/2011  . 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 POSTMENOPAUSAL STATUS 01/10/2010  . BENIGN POSITIONAL VERTIGO 11/08/2009  . SPINAL STENOSIS, LUMBAR 07/06/2009  . OTH NONSPC ABN FINDNG RAD&OTH EXM BODY STRUCTURE 03/30/2009  . BACK PAIN 04/23/2007    Past Surgical History:  Procedure Laterality Date  . BREAST LUMPECTOMY  1994   right  . CERVICAL FUSION  1995   C5-C7  . MASTECTOMY  2005   Bilat     OB History    Gravida Para Term Preterm AB Living   0 0 0 0 0 0   SAB TAB Ectopic Multiple Live Births   0 0 0  0 0       Home Medications    Prior to Admission medications   Medication Sig Start Date End Date Taking? Authorizing Provider  AMBULATORY NON FORMULARY MEDICATION Medication Name: full orthotics for heel spurs and planta fasciitis. 02/18/15   Hali Marry, MD  AMBULATORY NON FORMULARY MEDICATION Medication Name: compression sleeve for right arm and hand. DX:I89.0 09/06/15   Hali Marry, MD  calcium carbonate (OS-CAL) 600 MG TABS Take 600 mg by mouth daily.      [provider]  cholecalciferol (VITAMIN D) 1000 UNITS tablet Take 1,000 Units by mouth daily.      [provider]  DULoxetine (CYMBALTA) 30 MG capsule TAKE 1 CAPSULE BY MOUTH  DAILY 01/27/16   Hali Marry, MD  fluticasone (FLONASE) 50 MCG/ACT nasal spray Place into both nostrils daily.    [provider]  gabapentin (NEURONTIN) 300 MG capsule TAKE 2 CAPSULES BY MOUTH  DAILY 12/25/16   Hali Marry, MD  HYDROcodone-acetaminophen (NORCO/VICODIN) 5-325 MG tablet Take 1-2 tablets by mouth every 6 (six) hours as needed for moderate pain or severe pain. 01/28/17   Noe Gens, PA-C  Krill Oil 1000 MG CAPS Take by mouth.    [provider]  metFORMIN (GLUCOPHAGE) 500  MG tablet Take 1 tablet (500 mg total) by mouth 2 (two) times daily with a meal. 07/20/16   Hali Marry, MD  Multiple Vitamins-Minerals (VITEYES AREDS FORMULA PO) Take by mouth daily.    [provider]  predniSONE (DELTASONE) 20 MG tablet 3 tabs po day one, then 2 po daily x 4 days 01/28/17   Noe Gens, PA-C  rOPINIRole (REQUIP) 0.5 MG tablet TAKE 1 TABLET BY MOUTH AT  BEDTIME 08/21/16   Hali Marry, MD  vitamin C (ASCORBIC ACID) 500 MG tablet Take 500 mg by mouth daily.      [provider]    Family History Family History  Problem Relation Age of Onset  . Stroke Mother   . Hyperlipidemia Mother   . Skin cancer Mother   . Breast cancer Mother   .  Diabetes Father   . Hyperlipidemia Father   . Heart attack Father 62  . Colon cancer Father   . Cancer Father 56       prostate  . Diabetes Paternal Grandmother   . Heart attack Paternal Grandmother   . Rheum arthritis Paternal Grandmother   . Hyperlipidemia Sister   . Heart attack Paternal Grandfather   . Heart attack Maternal Grandfather   . Heart attack Maternal Grandmother   . Skin cancer Maternal Grandmother   . Rheum arthritis Maternal Grandmother   . Breast cancer Sister   . Lymphoma Maternal Aunt   . Kidney cancer Maternal Aunt     Social History Social History   Tobacco Use  . Smoking status: Former Smoker    Types: Cigarettes    Last attempt to quit: 10/31/2006    Years since quitting: 10.2  . Smokeless tobacco: Never Used  Substance Use Topics  . Alcohol use: No  . Drug use: No     Allergies   Patient has no known allergies.   Review of Systems Review of Systems  Musculoskeletal: Positive for arthralgias and myalgias. Negative for joint swelling.  Skin: Negative for color change and wound.  Neurological: Positive for weakness. Negative for numbness.     Physical Exam Triage Vital Signs ED Triage Vitals  Enc Vitals Group     BP 01/28/17 1144 115/75     Pulse Rate 01/28/17 1144 68     Resp 01/28/17 1144 16     Temp 01/28/17 1144 99 F (37.2 C)     Temp Source 01/28/17 1144 Oral     SpO2 01/28/17 1144 97 %     Weight 01/28/17 1145 245 lb (111.1 kg)     Height 01/28/17 1145 5\' 6"  (1.676 m)     Head Circumference --      Peak Flow --      Pain Score 01/28/17 1146 6     Pain Loc --      Pain Edu? --      Excl. in [place]? --    No data found.  Updated Vital Signs BP 115/75 (BP Location: Left Arm)   Pulse 68   Temp 99 F (37.2 C) (Oral)   Resp 16   Ht 5\' 6"  (1.676 m)   Wt 245 lb (111.1 kg)   SpO2 97%   BMI 39.54 kg/m   Visual Acuity Right Eye Distance:   Left Eye Distance:   Bilateral Distance:    Right Eye Near:   Left Eye Near:      Bilateral Near:     Physical Exam  Constitutional:  She is oriented to person, place, and time. She appears well-developed and well-nourished. No distress.  HENT:  Head: Normocephalic and atraumatic.  Eyes: EOM are normal.  Neck: Normal range of motion.  Cardiovascular: Normal rate.  Pulses:      Radial pulses are 2+ on the right side, and 2+ on the left side.  Pulmonary/Chest: Effort normal.  Musculoskeletal: Normal range of motion. She exhibits edema and tenderness.  Right arm: mild edema compared to Left c/w hx of lymphedema from mastectomy.  Full ROM Right shoulder with increased pain on end range. Tenderness over proximal biceps tendon. No crepitus.  5/5 grip strength.   Neurological: She is alert and oriented to person, place, and time.  Skin: Skin is warm and dry. Capillary refill takes less than 2 seconds. She is not diaphoretic.  Psychiatric: She has a normal mood and affect. Her behavior is normal.  Nursing note and vitals reviewed.    UC Treatments / Results  Labs (all labs ordered are listed, but only abnormal results are displayed) Labs Reviewed - No data to display  EKG  EKG Interpretation None       Radiology Dg Shoulder Right  Result Date: 01/28/2017 CLINICAL DATA:  Pt states that for the past 5 days she has had right shoulder pain. Denies injury but has been doing some heavy lifting lately putting Christmas stuff away. Pain is anterior and radiates down her right arm. Hx of breast ca. EXAM: RIGHT SHOULDER - 2+ VIEW COMPARISON:  None. FINDINGS: Right axillary surgical clips. Visualized portion of the right hemithorax is normal. No acute fracture or dislocation. Mild degenerate changes of the undersurface of the acromioclavicular joint. No focal osseous lesion. IMPRESSION: No acute osseous abnormality. Electronically Signed   By: Abigail Miyamoto M.D.   On: 01/28/2017 12:29    Procedures Procedures (including critical care time)  Medications Ordered in  UC Medications - No data to display   Initial Impression / Assessment and Plan / UC Course  I have reviewed the triage vital signs and the nursing notes.  Pertinent labs & imaging results that were available during my care of the patient were reviewed by me and considered in my medical decision making (see chart for details).     Hx and exam c/w biceps tendonitis, likely from overuse over the last 1-2 weeks. Home care instructions provided F/u with PCP or with Sports Medicine in same office in 1-2 weeks if not improving.   Final Clinical Impressions(s) / UC Diagnoses   Final diagnoses:  Acute pain of right shoulder    ED Discharge Orders        Ordered    predniSONE (DELTASONE) 20 MG tablet     01/28/17 1237    HYDROcodone-acetaminophen (NORCO/VICODIN) 5-325 MG tablet  Every 6 hours PRN     01/28/17 1237       Controlled Substance Prescriptions [place] Controlled Substance Registry consulted? attempted to [place], unanle to find pt. will prescribe short course of Vicodin as Tramadol has potential interaction with pt's cymbalta.    Noe Gens, [place] 01/28/17 [phone]

## 2017-01-28 NOTE — Discharge Instructions (Signed)
°  Norco/Vicodin (hydrocodone-acetaminophen) is a narcotic pain medication, do not combine these medications with others containing tylenol. While taking, do not drink alcohol, drive, or perform any other activities that requires focus while taking these medications.   You may take 500mg  acetaminophen every 4-6 hours or in combination with ibuprofen 400-600mg  every 6-8 hours as needed for pain and inflammation.

## 2017-02-01 ENCOUNTER — Telehealth: Payer: Self-pay

## 2017-02-01 NOTE — Telephone Encounter (Signed)
Spoke with patient feels that shoulder is feeling better.  Will follow up as needed.

## 2017-02-15 ENCOUNTER — Ambulatory Visit: Payer: 59 | Admitting: Family Medicine

## 2017-02-15 ENCOUNTER — Encounter: Payer: Self-pay | Admitting: Family Medicine

## 2017-02-15 VITALS — BP 139/60 | HR 92 | Temp 99.3°F | Ht 66.0 in | Wt 247.0 lb

## 2017-02-15 DIAGNOSIS — J0100 Acute maxillary sinusitis, unspecified: Secondary | ICD-10-CM | POA: Diagnosis not present

## 2017-02-15 MED ORDER — BENZONATATE 200 MG PO CAPS: 200.0000 mg | ORAL_CAPSULE | Freq: Three times a day (TID) | ORAL | 1 refills | Status: DC | PRN

## 2017-02-15 MED ORDER — AZITHROMYCIN 250 MG PO TABS: 250.0000 mg | ORAL_TABLET | Freq: Every day | ORAL | 0 refills | Status: DC

## 2017-02-15 MED ORDER — IPRATROPIUM BROMIDE 0.06 % NA SOLN: 2.0000 | NASAL | 6 refills | Status: DC | PRN

## 2017-02-15 MED ORDER — PREDNISONE 10 MG PO TABS: 30.0000 mg | ORAL_TABLET | Freq: Every day | ORAL | 0 refills | Status: DC

## 2017-02-15 MED ORDER — GUAIFENESIN-CODEINE 100-10 MG/5ML PO SOLN
5.0000 mL | Freq: Every evening | ORAL | 0 refills | Status: DC | PRN
Start: 2017-02-15 — End: 2017-05-28

## 2017-02-15 NOTE — Progress Notes (Signed)
Patricia Waters is a 62 y.o. female who presents to [facility]: [place] today for sore throat congestion runny nose cough.  Symptoms present for about a week.  Patient notes the cough is nonproductive but does interfere with sleep.  She notes the nasal discharge is yellowish.  She denies significant wheezing or shortness of breath.  She is tried some over-the-counter medications which have helped some.  No vomiting chest pain palpitations or diarrhea.   Past Medical History:  Diagnosis Date  . Breast cancer ([place])    at age 62  . Sleep apnea   . Vertigo    Past Surgical History:  Procedure Laterality Date  . BREAST LUMPECTOMY  1994   right  . CERVICAL FUSION  1995   C5-C7  . MASTECTOMY  2005   Bilat    Social History   Tobacco Use  . Smoking status: Former Smoker    Types: Cigarettes    Last attempt to quit: 10/31/2006    Years since quitting: 10.3  . Smokeless tobacco: Never Used  Substance Use Topics  . Alcohol use: No   family history includes Breast cancer in her mother and sister; Cancer (age of onset: 12) in her father; Colon cancer in her father; Diabetes in her father and paternal grandmother; Heart attack in her maternal grandfather, maternal grandmother, paternal grandfather, and paternal grandmother; Heart attack (age of onset: 36) in her father; Hyperlipidemia in her father, mother, and sister; Kidney cancer in her maternal aunt; Lymphoma in her maternal aunt; Rheum arthritis in her maternal grandmother and paternal grandmother; Skin cancer in her maternal grandmother and mother; Stroke in her mother.  ROS as above:  Medications: Current Outpatient Medications  Medication Sig Dispense Refill  . AMBULATORY NON FORMULARY MEDICATION Medication Name: full orthotics for heel spurs and planta fasciitis. 1 Units 0  . AMBULATORY NON FORMULARY MEDICATION  Medication Name: compression sleeve for right arm and hand. DX:I89.0 1 each 0  . calcium carbonate (OS-CAL) 600 MG TABS Take 600 mg by mouth daily.      . cholecalciferol (VITAMIN D) 1000 UNITS tablet Take 1,000 Units by mouth daily.      . DULoxetine (CYMBALTA) 30 MG capsule TAKE 1 CAPSULE BY MOUTH  DAILY 90 capsule 0  . fluticasone (FLONASE) 50 MCG/ACT nasal spray Place into both nostrils daily.    Marland Kitchen gabapentin (NEURONTIN) 300 MG capsule TAKE 2 CAPSULES BY MOUTH  DAILY 180 capsule 2  . HYDROcodone-acetaminophen (NORCO/VICODIN) 5-325 MG tablet Take 1-2 tablets by mouth every 6 (six) hours as needed for moderate pain or severe pain. 10 tablet 0  . Krill Oil 1000 MG CAPS Take by mouth.    . metFORMIN (GLUCOPHAGE) 500 MG tablet Take 1 tablet (500 mg total) by mouth 2 (two) times daily with a meal. 180 tablet 3  . Multiple Vitamins-Minerals (VITEYES AREDS FORMULA PO) Take by mouth daily.    Marland Kitchen rOPINIRole (REQUIP) 0.5 MG tablet TAKE 1 TABLET BY MOUTH AT  BEDTIME 90 tablet 3  . vitamin C (ASCORBIC ACID) 500 MG tablet Take 500 mg by mouth daily.      Marland Kitchen azithromycin (ZITHROMAX) 250 MG tablet Take 1 tablet (250 mg total) by mouth daily. Take first 2 tablets together, then 1 every day until finished. 6 tablet 0  . benzonatate (TESSALON) 200 MG capsule Take 1 capsule (200 mg total) by mouth 3 (three) times daily as needed for cough. [place]  capsule 1  . guaiFENesin-codeine 100-10 MG/5ML syrup Take 5 mLs by mouth at bedtime as needed for cough. 120 mL 0  . ipratropium (ATROVENT) 0.06 % nasal spray Place 2 sprays into both nostrils every 4 (four) hours as needed for rhinitis. 10 mL 6  . predniSONE (DELTASONE) 10 MG tablet Take 3 tablets (30 mg total) by mouth daily with breakfast. 15 tablet 0   No current facility-administered medications for this visit.    No Known Allergies  Health Maintenance Health Maintenance  Topic Date Due  . HIV Screening  06/21/1970  . COLONOSCOPY  01/30/2017  . TETANUS/TDAP   10/17/2018  . PAP SMEAR  09/13/2021  . INFLUENZA VACCINE  Completed  . Hepatitis C Screening  Completed     Exam:  BP 139/60   Pulse 92   Temp 99.3 F (37.4 C) (Oral)   Ht 5\' 6"  (1.676 m)   Wt 247 lb (112 kg)   SpO2 96%   BMI 39.87 kg/m  Gen: Well NAD HEENT: EOMI,  MMM clear nasal discharge.  Posterior pharynx with cobblestoning.  Normal tympanic membranes bilaterally.  Mild cervical lymphadenopathy bilaterally Lungs: Normal work of breathing. CTABL Heart: RRR no MRG Abd: NABS, Soft. Nondistended, Nontender Exts: Brisk capillary refill, warm and well perfused.    No results found for this or any previous visit (from the past 72 hour(s)). No results found.    Assessment and Plan: 62 y.o. female with viral URI developing into sinusitis or bronchitis.  We discussed options.  Plan for symptom control with Atrovent nasal spray and codeine cough syrup as well as Tessalon Perles.  Will use prednisone for inflammation as patient's symptoms are going on longer than would be typical.  Additionally she is failing conservative management.  I have printed a prescription for azithromycin to use if worsening   No orders of the defined types were placed in this encounter.  Meds ordered this encounter  Medications  . predniSONE (DELTASONE) 10 MG tablet    Sig: Take 3 tablets (30 mg total) by mouth daily with breakfast.    Dispense:  15 tablet    Refill:  0  . ipratropium (ATROVENT) 0.06 % nasal spray    Sig: Place 2 sprays into both nostrils every 4 (four) hours as needed for rhinitis.    Dispense:  10 mL    Refill:  6  . guaiFENesin-codeine 100-10 MG/5ML syrup    Sig: Take 5 mLs by mouth at bedtime as needed for cough.    Dispense:  120 mL    Refill:  0  . azithromycin (ZITHROMAX) 250 MG tablet    Sig: Take 1 tablet (250 mg total) by mouth daily. Take first 2 tablets together, then 1 every day until finished.    Dispense:  6 tablet    Refill:  0  . benzonatate (TESSALON) 200 MG  capsule    Sig: Take 1 capsule (200 mg total) by mouth 3 (three) times daily as needed for cough.    Dispense:  45 capsule    Refill:  1     Discussed warning signs or symptoms. Please see discharge instructions. Patient expresses understanding.

## 2017-02-15 NOTE — Patient Instructions (Signed)
Thank you for coming in today. Call or go to the emergency room if you get worse, have trouble breathing, have chest pains, or palpitations.  Take prednisone and use Atrovent nasal spray  Use codeine cough medicine.  Take back up azithromycin if not better.  Call or go to the emergency room if you get worse, have trouble breathing, have chest pains, or palpitations.    Cough, Adult Coughing is a reflex that clears your throat and your airways. Coughing helps to heal and protect your lungs. It is normal to cough occasionally, but a cough that happens with other symptoms or lasts a long time may be a sign of a condition that needs treatment. A cough may last only 2-3 weeks (acute), or it may last longer than 8 weeks (chronic). What are the causes? Coughing is commonly caused by:  Breathing in substances that irritate your lungs.  A viral or bacterial respiratory infection.  Allergies.  Asthma.  Postnasal drip.  Smoking.  Acid backing up from the stomach into the esophagus (gastroesophageal reflux).  Certain medicines.  Chronic lung problems, including COPD (or rarely, lung cancer).  Other medical conditions such as heart failure.  Follow these instructions at home: Pay attention to any changes in your symptoms. Take these actions to help with your discomfort:  Take medicines only as told by your health care provider. ? If you were prescribed an antibiotic medicine, take it as told by your health care provider. Do not stop taking the antibiotic even if you start to feel better. ? Talk with your health care provider before you take a cough suppressant medicine.  Drink enough fluid to keep your urine clear or pale yellow.  If the air is dry, use a cold steam vaporizer or humidifier in your bedroom or your home to help loosen secretions.  Avoid anything that causes you to cough at work or at home.  If your cough is worse at night, try sleeping in a semi-upright  position.  Avoid cigarette smoke. If you smoke, quit smoking. If you need help quitting, ask your health care provider.  Avoid caffeine.  Avoid alcohol.  Rest as needed.  Contact a health care provider if:  You have new symptoms.  You cough up pus.  Your cough does not get better after 2-3 weeks, or your cough gets worse.  You cannot control your cough with suppressant medicines and you are losing sleep.  You develop pain that is getting worse or pain that is not controlled with pain medicines.  You have a fever.  You have unexplained weight loss.  You have night sweats. Get help right away if:  You cough up blood.  You have difficulty breathing.  Your heartbeat is very fast. This information is not intended to replace advice given to you by your health care provider. Make sure you discuss any questions you have with your health care provider. Document Released: 07/15/2010 Document Revised: 06/24/2015 Document Reviewed: 03/25/2014 Elsevier Interactive Patient Education  [employer].

## 2017-03-24 ENCOUNTER — Other Ambulatory Visit: Payer: Self-pay | Admitting: Family Medicine

## 2017-05-28 ENCOUNTER — Ambulatory Visit: Payer: 59 | Admitting: Family Medicine

## 2017-05-28 ENCOUNTER — Encounter: Payer: Self-pay | Admitting: Family Medicine

## 2017-05-28 VITALS — BP 119/65 | HR 64 | Ht 66.0 in | Wt 251.0 lb

## 2017-05-28 DIAGNOSIS — R635 Abnormal weight gain: Secondary | ICD-10-CM

## 2017-05-28 DIAGNOSIS — R7301 Impaired fasting glucose: Secondary | ICD-10-CM | POA: Diagnosis not present

## 2017-05-28 LAB — POCT GLYCOSYLATED HEMOGLOBIN (HGB A1C): Hemoglobin A1C: 5.2

## 2017-05-28 MED ORDER — LIRAGLUTIDE -WEIGHT MANAGEMENT 18 MG/3ML ~~LOC~~ SOPN: PEN_INJECTOR | SUBCUTANEOUS | 1 refills | Status: DC

## 2017-05-28 NOTE — Progress Notes (Signed)
Subjective:    CC: IFG, weight gain  HPI: Impaired fasting glucose-no increased thirst or urination. No symptoms consistent with hypoglycemia.  Decided to stop the metformin about a month ago because she felt like it was causing some weight gain.  He has gained 4 pounds that she was last here in January for sinus infection. She constantly snacks and feels hungry. She recenlty bought a bike to exercise.    WEight gain - she is also concerned the Cymbalta has been causing some weight gain.  She is also on gabapentin. Has been doing some yard work.    Past medical history, Surgical history, Family history not pertinant except as noted below, Social history, Allergies, and medications have been entered into the medical record, reviewed, and corrections made.   Review of Systems: No fevers, chills, night sweats, weight loss, chest pain, or shortness of breath.   Objective:    General: Well Developed, well nourished, and in no acute distress.  Neuro: Alert and oriented x3, extra-ocular muscles intact, sensation grossly intact.  HEENT: Normocephalic, atraumatic  Skin: Warm and dry, no rashes. Cardiac: Regular rate and rhythm, no murmurs rubs or gallops, no lower extremity edema.  Respiratory: Clear to auscultation bilaterally. Not using accessory muscles, speaking in full sentences.   Impression and Recommendations:    IFG - improved.  Even off of metformin doing OK.  contineu to work on diet and exercise.  Lab Results  Component Value Date   HGBA1C 5.2 05/28/2017    Abnormal weight gain/BMI 40 - discussed medication options.  Will try to see if insurance will cover Saxenda.    Current Weight: 251 lbs  Previous WEight: 247 lbs Change in Weight: Up 4 lbs.   Dietary goals: cut out peanut M&Ms. Slow down eating.  Exercise goals: riding new bike, walking.  Goal weight: 175 lbs.  Medication: Saxenda, new start.  Follow up : 4 weeks.    Time spent 25 min, > 50% spent counseling about  glucose and diabetic and need for weight loss.

## 2017-06-01 ENCOUNTER — Telehealth: Payer: Self-pay | Admitting: *Deleted

## 2017-06-01 ENCOUNTER — Encounter: Payer: Self-pay | Admitting: Family Medicine

## 2017-06-01 ENCOUNTER — Encounter: Payer: Self-pay | Admitting: *Deleted

## 2017-06-01 NOTE — Telephone Encounter (Signed)
Pt asked that a letter be written on her behalf regarding Saxenda coverage and Emailed to [email or web address]  Will give to K.Maryla Morrow to email .Elouise Munroe, [place]

## 2017-06-26 ENCOUNTER — Encounter: Payer: Self-pay | Admitting: Family Medicine

## 2017-06-27 ENCOUNTER — Ambulatory Visit: Payer: 59 | Admitting: Family Medicine

## 2017-07-19 ENCOUNTER — Encounter: Payer: Self-pay | Admitting: Family Medicine

## 2017-07-26 ENCOUNTER — Encounter: Payer: Self-pay | Admitting: Family Medicine

## 2017-07-26 MED ORDER — LIRAGLUTIDE -WEIGHT MANAGEMENT 18 MG/3ML ~~LOC~~ SOPN: PEN_INJECTOR | SUBCUTANEOUS | 1 refills | Status: DC

## 2017-07-26 MED ORDER — PEN NEEDLES 32G X 6 MM MISC: 1.0000 | Freq: Every day | 1 refills | Status: DC

## 2017-08-08 ENCOUNTER — Other Ambulatory Visit: Payer: Self-pay | Admitting: Family Medicine

## 2017-08-08 DIAGNOSIS — Z0000 Encounter for general adult medical examination without abnormal findings: Secondary | ICD-10-CM

## 2017-08-24 ENCOUNTER — Encounter: Payer: Self-pay | Admitting: Family Medicine

## 2017-08-24 MED ORDER — LIRAGLUTIDE -WEIGHT MANAGEMENT 18 MG/3ML ~~LOC~~ SOPN: 2.4000 mg | PEN_INJECTOR | Freq: Every day | SUBCUTANEOUS | 0 refills | Status: DC

## 2017-09-11 ENCOUNTER — Ambulatory Visit: Payer: 59 | Admitting: Family Medicine

## 2017-09-11 ENCOUNTER — Encounter: Payer: Self-pay | Admitting: Family Medicine

## 2017-09-11 VITALS — BP 122/61 | HR 84 | Ht 66.0 in | Wt 242.0 lb

## 2017-09-11 DIAGNOSIS — R635 Abnormal weight gain: Secondary | ICD-10-CM | POA: Diagnosis not present

## 2017-09-11 DIAGNOSIS — Z1211 Encounter for screening for malignant neoplasm of colon: Secondary | ICD-10-CM | POA: Diagnosis not present

## 2017-09-11 DIAGNOSIS — Z6839 Body mass index (BMI) 39.0-39.9, adult: Secondary | ICD-10-CM

## 2017-09-11 MED ORDER — LIRAGLUTIDE -WEIGHT MANAGEMENT 18 MG/3ML ~~LOC~~ SOPN: 2.4000 mg | PEN_INJECTOR | Freq: Every day | SUBCUTANEOUS | 0 refills | Status: DC

## 2017-09-11 NOTE — Patient Instructions (Signed)
  Current Weight: 242 lbs  Previous WEight: 251 lbs Change in Weight: Down 9 lbs.   Dietary goals: eat breakfast regularly with some protein. Try to have a snack mid morning if you are going to eat lunch late.   Exercise goals: riding new bike, walking. 2 days per week for 15-20 minutes.  Goal weight: 175-180 lbs.  Medication: Saxenda 2.4mg  Follow up : 4 weeks.

## 2017-09-11 NOTE — Progress Notes (Signed)
Subjective:    Patient ID: Patricia Waters, female    DOB: 1955-09-08, 62 y.o.   MRN: [id number]  HPI 62 year old female is here today to follow-up on new start of Saxenda for weight loss-she is actually doing really really well.  She has noticed that she is felt a little bit more fatigued and some slight nausea but nothing significant.  She is also had a little diarrhea here and there.  No vomiting.  She has been able to give up her peanut M&Ms.  She did want to know if she should be eating breakfast regularly.  She likes breakfast and enjoys eating it but just was not sure.  She is just noticing that she is getting full more quickly and does not want to quite finish her food which is been great.  Is been easier to not eat sweets.  She has been trying to ride her bike and walk some but has not been very consistent with her exercise.  She says they have had a lot of company visiting from out of state and so they have been cooking a lot and not really exercising.  But she plans to get back on track.  They are hoping to install a pool next year which is her preferred way of exercising.   Review of Systems  BP 122/61   Pulse 84   Ht 5\' 6"  (1.676 m)   Wt 242 lb (109.8 kg)   SpO2 96%   BMI 39.06 kg/m     No Known Allergies  Past Medical History:  Diagnosis Date  . Breast cancer ([place])    at age 23  . Sleep apnea   . Vertigo     Past Surgical History:  Procedure Laterality Date  . BREAST LUMPECTOMY  1994   right  . CERVICAL FUSION  1995   C5-C7  . MASTECTOMY  2005   Bilat     Social History   Socioeconomic History  . Marital status: Married    Spouse name: [name]  . Number of children: Not on file  . Years of education: Not on file  . Highest education level: Not on file  Occupational History  . 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 Financial resource strain: Not on file  . Food insecurity:    Worry: Not on file     Inability: Not on file  . Transportation needs:    Medical: Not on file    Non-medical: Not on file  Tobacco Use  . Smoking status: Former Smoker    Types: Cigarettes    Last attempt to quit: 10/31/2006    Years since quitting: 10.8  . Smokeless tobacco: Never Used  Substance and Sexual Activity  . Alcohol use: No  . Drug use: No  . Sexual activity: Not Currently    Partners: Female    Birth control/protection: None  Lifestyle  . Physical activity:    Days per week: Not on file    Minutes per session: Not on file  . Stress: Not on file  Relationships  . Social connections:    Talks on phone: Not on file    Gets together: Not on file    Attends religious service: Not on file    Active member of club or organization: Not on file    Attends meetings of clubs or organizations: Not on file    Relationship status: Not on file  .  Intimate partner violence:    Fear of current or ex partner: Not on file    Emotionally abused: Not on file    Physically abused: Not on file    Forced sexual activity: Not on file  Other Topics Concern  . Not on file  Social History Narrative   Lives with her partner, [name]. Former Therapist, nutritional.     Family History  Problem Relation Age of Onset  . Stroke Mother   . Hyperlipidemia Mother   . Skin cancer Mother   . Breast cancer Mother   . Diabetes Father   . Hyperlipidemia Father   . Heart attack Father 25  . Colon cancer Father   . Cancer Father 75       prostate  . Diabetes Paternal Grandmother   . Heart attack Paternal Grandmother   . Rheum arthritis Paternal Grandmother   . Hyperlipidemia Sister   . Heart attack Paternal Grandfather   . Heart attack Maternal Grandfather   . Heart attack Maternal Grandmother   . Skin cancer Maternal Grandmother   . Rheum arthritis Maternal Grandmother   . Breast cancer Sister   . Lymphoma Maternal Aunt   . Kidney cancer Maternal Aunt     Outpatient Encounter Medications as of 09/11/2017   Medication Sig  . calcium carbonate (OS-CAL) 600 MG TABS Take 600 mg by mouth daily.    . cholecalciferol (VITAMIN D) 1000 UNITS tablet Take 1,000 Units by mouth daily.    . DULoxetine (CYMBALTA) 30 MG capsule TAKE 1 CAPSULE BY MOUTH  DAILY  . fluticasone (FLONASE) 50 MCG/ACT nasal spray Place into both nostrils daily.  Marland Kitchen gabapentin (NEURONTIN) 300 MG capsule TAKE 2 CAPSULES BY MOUTH  DAILY  . Insulin Pen Needle (PEN NEEDLES) 32G X 6 MM MISC 1 each by Does not apply route daily. Use to inject Saxenda, once daily.  Javier Docker Oil 1000 MG CAPS Take by mouth.  . Liraglutide -Weight Management (SAXENDA) 18 MG/3ML SOPN Inject 2.4 mg into the skin daily.  . Multiple Vitamins-Minerals (VITEYES AREDS FORMULA PO) Take by mouth daily.  Marland Kitchen rOPINIRole (REQUIP) 0.5 MG tablet TAKE 1 TABLET BY MOUTH AT  BEDTIME  . vitamin C (ASCORBIC ACID) 500 MG tablet Take 500 mg by mouth daily.    . [DISCONTINUED] AMBULATORY NON FORMULARY MEDICATION Medication Name: full orthotics for heel spurs and planta fasciitis.  . [DISCONTINUED] AMBULATORY NON FORMULARY MEDICATION Medication Name: compression sleeve for right arm and hand. DX:I89.0  . [DISCONTINUED] Liraglutide -Weight Management (SAXENDA) 18 MG/3ML SOPN Inject 2.4 mg into the skin daily.  . [DISCONTINUED] loratadine (CLARITIN) 10 MG tablet Take 10 mg by mouth daily.  . [DISCONTINUED] Multiple Vitamins-Minerals (ZINC PO) Take 1 tablet by mouth daily.   No facility-administered encounter medications on file as of 09/11/2017.          Objective:   Physical Exam  Constitutional: She is oriented to person, place, and time. She appears well-developed and well-nourished.  HENT:  Head: Normocephalic and atraumatic.  Cardiovascular: Normal rate, regular rhythm and normal heart sounds.  Pulmonary/Chest: Effort normal and breath sounds normal.  Neurological: She is alert and oriented to person, place, and time.  Skin: Skin is warm and dry.  Psychiatric: She has a  normal mood and affect. Her behavior is normal.       Assessment & Plan:  AbNormal weight gain/BMI 39 -  Doing great! Down 9 lbs.  She has made some changes.  We will continue to  work on [facility]. I did recommend she start eating breakfast with some protein and we reviewed some choices.   F/U in 4- 6 weeks.  Continue current dose of Saxenda.  I will not increase to 3 mg at this point but will monitor carefully.  Current Weight: 242 lbs  Previous WEight: 251 lbs Change in Weight: Down 9 lbs.   Dietary goals: eat breakfast regularly with some protein. Try to have a snack mid morning if you are going to eat lunch late.   Exercise goals: riding new bike, walking. 2 days per week for 15-20 minutes.  Goal weight: 175-180 lbs.  Medication: Saxenda 2.4mg  Follow up : 4-6 weeks.   Time spent 20 min, greater than percent time spent counseling about weight gain and strategies around weight loss.

## 2017-09-19 ENCOUNTER — Other Ambulatory Visit: Payer: Self-pay | Admitting: Family Medicine

## 2017-09-21 ENCOUNTER — Encounter: Payer: Self-pay | Admitting: Family Medicine

## 2017-09-24 MED ORDER — LIRAGLUTIDE -WEIGHT MANAGEMENT 18 MG/3ML ~~LOC~~ SOPN: 3.0000 mg | PEN_INJECTOR | Freq: Every day | SUBCUTANEOUS | 0 refills | Status: DC

## 2017-10-17 ENCOUNTER — Other Ambulatory Visit: Payer: Self-pay | Admitting: Family Medicine

## 2017-10-23 ENCOUNTER — Ambulatory Visit: Payer: 59 | Admitting: Family Medicine

## 2017-10-23 LAB — HM COLONOSCOPY

## 2017-10-24 ENCOUNTER — Encounter: Payer: Self-pay | Admitting: Family Medicine

## 2017-10-24 ENCOUNTER — Ambulatory Visit: Payer: 59 | Admitting: Family Medicine

## 2017-10-24 VITALS — BP 124/63 | HR 65 | Ht 66.0 in | Wt 241.0 lb

## 2017-10-24 DIAGNOSIS — R635 Abnormal weight gain: Secondary | ICD-10-CM

## 2017-10-24 DIAGNOSIS — Z6838 Body mass index (BMI) 38.0-38.9, adult: Secondary | ICD-10-CM | POA: Diagnosis not present

## 2017-10-24 DIAGNOSIS — Z23 Encounter for immunization: Secondary | ICD-10-CM

## 2017-10-24 MED ORDER — LIRAGLUTIDE -WEIGHT MANAGEMENT 18 MG/3ML ~~LOC~~ SOPN: 3.0000 mg | PEN_INJECTOR | Freq: Every day | SUBCUTANEOUS | 1 refills | Status: DC

## 2017-10-24 NOTE — Progress Notes (Signed)
Subjective:    Patient ID: Patricia Waters, female    DOB: 07/30/1955, 62 y.o.   MRN: [id number]  HPI 62 year old female here today to follow-up for abnormal weight gain.  While she is actually been doing well.  She is only down about 1 pound but she is been tolerating the medication well without any side effects or problems.  She is up to 3 mg on the Sexenda.  She has been trying to do some protein shakes for breakfast and trying to make healthier choices though she is been doing a lot of volunteer work at [employer] and sometimes will just order pizza or things like that and so she admits her diet has not been as good as it could be.  She has not been exercising but has been much more physically active than she was previously.  She has not created his exercise routine for herself.  She feels like she does a great job and drinking plenty of water daily.   Review of Systems  BP 124/63   Pulse 65   Ht 5\' 6"  (1.676 m)   Wt 241 lb (109.3 kg)   SpO2 97%   BMI 38.90 kg/m     No Known Allergies  Past Medical History:  Diagnosis Date  . Breast cancer ([place])    at age 40  . Sleep apnea   . Vertigo     Past Surgical History:  Procedure Laterality Date  . BREAST LUMPECTOMY  1994   right  . CERVICAL FUSION  1995   C5-C7  . MASTECTOMY  2005   Bilat     Social History   Socioeconomic History  . Marital status: Married    Spouse name: [name]  . Number of children: Not on file  . Years of education: Not on file  . Highest education level: Not on file  Occupational History  . 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 Financial resource strain: Not on file  . Food insecurity:    Worry: Not on file    Inability: Not on file  . Transportation needs:    Medical: Not on file    Non-medical: Not on file  Tobacco Use  . Smoking status: Former Smoker    Types: Cigarettes    Last attempt to quit: 10/31/2006    Years since quitting: 10.9   . Smokeless tobacco: Never Used  Substance and Sexual Activity  . Alcohol use: No  . Drug use: No  . Sexual activity: Not Currently    Partners: Female    Birth control/protection: None  Lifestyle  . Physical activity:    Days per week: Not on file    Minutes per session: Not on file  . Stress: Not on file  Relationships  . Social connections:    Talks on phone: Not on file    Gets together: Not on file    Attends religious service: Not on file    Active member of club or organization: Not on file    Attends meetings of clubs or organizations: Not on file    Relationship status: Not on file  . Intimate partner violence:    Fear of current or ex partner: Not on file    Emotionally abused: Not on file    Physically abused: Not on file    Forced sexual activity: Not on file  Other Topics Concern  . Not on file  Social History Narrative   Lives with her partner, [name]. Former Therapist, nutritional.     Family History  Problem Relation Age of Onset  . Stroke Mother   . Hyperlipidemia Mother   . Skin cancer Mother   . Breast cancer Mother   . Diabetes Father   . Hyperlipidemia Father   . Heart attack Father 47  . Colon cancer Father   . Cancer Father 21       prostate  . Diabetes Paternal Grandmother   . Heart attack Paternal Grandmother   . Rheum arthritis Paternal Grandmother   . Hyperlipidemia Sister   . Heart attack Paternal Grandfather   . Heart attack Maternal Grandfather   . Heart attack Maternal Grandmother   . Skin cancer Maternal Grandmother   . Rheum arthritis Maternal Grandmother   . Breast cancer Sister   . Lymphoma Maternal Aunt   . Kidney cancer Maternal Aunt     Outpatient Encounter Medications as of 10/24/2017  Medication Sig  . calcium carbonate (OS-CAL) 600 MG TABS Take 600 mg by mouth daily.    . cholecalciferol (VITAMIN D) 1000 UNITS tablet Take 1,000 Units by mouth daily.    . DULoxetine (CYMBALTA) 30 MG capsule TAKE 1 CAPSULE BY MOUTH  DAILY   . fluticasone (FLONASE) 50 MCG/ACT nasal spray Place into both nostrils daily.  Marland Kitchen gabapentin (NEURONTIN) 300 MG capsule TAKE 2 CAPSULES BY MOUTH  DAILY  . Insulin Pen Needle (PEN NEEDLES) 32G X 6 MM MISC 1 each by Does not apply route daily. Use to inject Saxenda, once daily.  Javier Docker Oil 1000 MG CAPS Take by mouth.  . Liraglutide -Weight Management (SAXENDA) 18 MG/3ML SOPN Inject 3 mg into the skin daily.  . Multiple Vitamins-Minerals (VITEYES AREDS FORMULA PO) Take by mouth daily.  Marland Kitchen rOPINIRole (REQUIP) 0.5 MG tablet TAKE 1 TABLET BY MOUTH AT  BEDTIME  . vitamin C (ASCORBIC ACID) 500 MG tablet Take 500 mg by mouth daily.    . [DISCONTINUED] SAXENDA 18 MG/3ML SOPN INJECT 3 MG INTO THE SKIN DAILY.   No facility-administered encounter medications on file as of 10/24/2017.           Objective:   Physical Exam  Constitutional: She is oriented to person, place, and time. She appears well-developed and well-nourished.  HENT:  Head: Normocephalic and atraumatic.  Cardiovascular: Normal rate, regular rhythm and normal heart sounds.  Pulmonary/Chest: Effort normal and breath sounds normal.  Neurological: She is alert and oriented to person, place, and time.  Skin: Skin is warm and dry.  Psychiatric: She has a normal mood and affect. Her behavior is normal.       Assessment & Plan:  Abnormal weight gain/BMI 38 -overall doing okay.  Only down about 1 pound.  I want her to come back in 1 month for nurse visit so we can keep a close eye on things.  Continue with 3 mg Sexenda.  Refills sent to pharmacy.  We discussed strategies around trying to get a 15 to 20-minute walk-in daily especially on days where she is not volunteering and very physically active.  We also discussed making sure that she is getting enough protein in her diet so that she is feeling more full.  She has cut out a lot of the sweets including her peanut M&Ms and I congratulated her on that.  Really just need to get the exercise  piece going to that we can be successful in meeting her goals.  Current Weight: 241 lbs  Previous WEight: 242 lbs Change in Weight:Down 1 lbs.  Dietary goals: eat breakfast regularly with some protein. Try to have a snack mid morning if you are going to eat lunch late.    Gust may be even packing her lunch on days where she is volunteering. Exercise goals: riding new bike and/or walking, walking. 2 days per week for 15-20 minutes.  Goal weight: 175-180 lbs.  Medication: Saxenda 3 mg Follow up: 4-6 weeks with nurse visit.

## 2017-11-02 ENCOUNTER — Encounter: Payer: Self-pay | Admitting: Family Medicine

## 2017-11-14 ENCOUNTER — Other Ambulatory Visit: Payer: Self-pay | Admitting: Family Medicine

## 2017-11-21 ENCOUNTER — Ambulatory Visit (INDEPENDENT_AMBULATORY_CARE_PROVIDER_SITE_OTHER): Payer: 59 | Admitting: Physician Assistant

## 2017-11-21 VITALS — BP 119/61 | HR 68 | Temp 97.8°F | Wt 237.8 lb

## 2017-11-21 DIAGNOSIS — R635 Abnormal weight gain: Secondary | ICD-10-CM

## 2017-11-21 DIAGNOSIS — E669 Obesity, unspecified: Secondary | ICD-10-CM

## 2017-11-21 NOTE — Progress Notes (Signed)
Pt in today for weight check. Pts last weight was 241 lbs. Todays weight was 237lb 12 oz. Pt states she has been taking Saxenda 3 mg daily. Pt states she has been eating more fish and protein. Pt states she has not been exercising daily due to busy schedule.   Vitals:   11/21/17 0950  BP: 119/61  Pulse: 68  Temp: 97.8 F (36.6 C)  SpO2: 97%   Wt Readings from Last 3 Encounters:  11/21/17 237 lb 12 oz (107.8 kg)  10/24/17 241 lb (109.3 kg)  09/11/17 242 lb (109.8 kg)    She stated she will work harder on her exercise.Advised pt will send report to provider and if any changes will contact patient.

## 2018-01-02 ENCOUNTER — Ambulatory Visit (INDEPENDENT_AMBULATORY_CARE_PROVIDER_SITE_OTHER): Payer: 59 | Admitting: Family Medicine

## 2018-01-02 ENCOUNTER — Encounter: Payer: Self-pay | Admitting: Family Medicine

## 2018-01-02 VITALS — BP 126/64 | HR 72 | Ht 64.0 in | Wt 237.0 lb

## 2018-01-02 DIAGNOSIS — G2581 Restless legs syndrome: Secondary | ICD-10-CM

## 2018-01-02 DIAGNOSIS — R635 Abnormal weight gain: Secondary | ICD-10-CM | POA: Diagnosis not present

## 2018-01-02 DIAGNOSIS — Z13220 Encounter for screening for lipoid disorders: Secondary | ICD-10-CM | POA: Diagnosis not present

## 2018-01-02 DIAGNOSIS — R7301 Impaired fasting glucose: Secondary | ICD-10-CM

## 2018-01-02 LAB — POCT GLYCOSYLATED HEMOGLOBIN (HGB A1C): HEMOGLOBIN A1C: 5.2 % (ref 4.0–5.6)

## 2018-01-02 LAB — COMPLETE METABOLIC PANEL WITH GFR
AG RATIO: 1.9 (calc) (ref 1.0–2.5)
ALT: 13 U/L (ref 6–29)
AST: 18 U/L (ref 10–35)
Albumin: 4.6 g/dL (ref 3.6–5.1)
Alkaline phosphatase (APISO): 124 U/L (ref 33–130)
BILIRUBIN TOTAL: 0.4 mg/dL (ref 0.2–1.2)
BUN: 11 mg/dL (ref 7–25)
CHLORIDE: 101 mmol/L (ref 98–110)
CO2: 30 mmol/L (ref 20–32)
Calcium: 9.9 mg/dL (ref 8.6–10.4)
Creat: 0.75 mg/dL (ref 0.50–0.99)
GFR, EST AFRICAN AMERICAN: 100 mL/min/{1.73_m2} (ref >=60)
GFR, Est Non African American: 86 mL/min/{1.73_m2} (ref >=60)
GLOBULIN: 2.4 g/dL (ref 1.9–3.7)
Glucose, Bld: 88 mg/dL (ref 65–99)
POTASSIUM: 4.5 mmol/L (ref 3.5–5.3)
Sodium: 139 mmol/L (ref 135–146)
TOTAL PROTEIN: 7 g/dL (ref 6.1–8.1)

## 2018-01-02 LAB — LIPID PANEL
Cholesterol: 201 mg/dL — ABNORMAL HIGH (ref <200)
HDL: 54 mg/dL (ref >50)
LDL Cholesterol (Calc): 119 mg/dL (calc) — ABNORMAL HIGH
Non-HDL Cholesterol (Calc): 147 mg/dL (calc) — ABNORMAL HIGH (ref <130)
Total CHOL/HDL Ratio: 3.7 (calc) (ref <5.0)
Triglycerides: 165 mg/dL — ABNORMAL HIGH (ref <150)

## 2018-01-02 NOTE — Progress Notes (Signed)
Subjective:    CC:   HPI:  62 year old female comes in today to follow-up for abnormal weight gain-she is currently on Saxenda 3 mg daily.  She is been tolerating the injection well without any side effects or problems.  She has noticed that it really has curbed her appetite and does not want to eat as much.  Even others around her have commented that she is not eating as much.  Though she does admit that over the Thanksgiving week she ate a lot.  She is still struggling with getting some regular exercise into her regimen.  Impaired fasting glucose-she has done well and cutting back on her sweets overall.  Past medical history, Surgical history, Family history not pertinant except as noted below, Social history, Allergies, and medications have been entered into the medical record, reviewed, and corrections made.   Review of Systems: No fevers, chills, night sweats, weight loss, chest pain, or shortness of breath.   Objective:    General: Well Developed, well nourished, and in no acute distress.  Neuro: Alert and oriented x3, extra-ocular muscles intact, sensation grossly intact.  HEENT: Normocephalic, atraumatic  Skin: Warm and dry, no rashes. Cardiac: Regular rate and rhythm, no murmurs rubs or gallops, no lower extremity edema.  Respiratory: Clear to auscultation bilaterally. Not using accessory muscles, speaking in full sentences.   Impression and Recommendations:    Abnormal weight gain/BMI 40 - she is doing OK. Only down one pound.  Just continue to work on healthy diet and portion control.  She has tried hard to cut back on sweets but did not do well over the Thanksgiving week.  Just encouraged her to get back on track and make sure that she is eating things in moderation.  We also discussed the importance of getting back into some type of exercise routine.  I really like for her to power walk for at least 10 to 15 minutes a day especially if she does not have time to go to the gym  for regular exercise.  She is in general active but does not have dedicated aerobic exercise time in her routine.  Feels like she does a good job and drinking water.  IFG -globin A1c looks fantastic at 5.2 today.  We will continue to monitor every 6 to 12 months.

## 2018-01-05 ENCOUNTER — Other Ambulatory Visit: Payer: Self-pay | Admitting: Family Medicine

## 2018-01-09 ENCOUNTER — Other Ambulatory Visit: Payer: Self-pay | Admitting: Family Medicine

## 2018-02-08 ENCOUNTER — Other Ambulatory Visit: Payer: Self-pay | Admitting: Family Medicine

## 2018-02-13 ENCOUNTER — Ambulatory Visit (INDEPENDENT_AMBULATORY_CARE_PROVIDER_SITE_OTHER): Payer: 59 | Admitting: Family Medicine

## 2018-02-13 VITALS — BP 112/56 | HR 83 | Temp 97.6°F | Wt 237.0 lb

## 2018-02-13 DIAGNOSIS — R635 Abnormal weight gain: Secondary | ICD-10-CM

## 2018-02-13 DIAGNOSIS — E669 Obesity, unspecified: Secondary | ICD-10-CM

## 2018-02-13 NOTE — Progress Notes (Signed)
Pt in today for blood pressure and weight check. Patient denies trouble sleeping, palpitations or medication problems. Patients last weight was 237, todays weight is 236. 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  Pt advised to schedule a follow up with nurse/provider in 30 days.

## 2018-02-13 NOTE — Progress Notes (Signed)
Agree with documentation as above.   Raenette Sakata, MD  

## 2018-02-27 ENCOUNTER — Other Ambulatory Visit: Payer: Self-pay | Admitting: Family Medicine

## 2018-02-27 ENCOUNTER — Other Ambulatory Visit: Payer: Self-pay

## 2018-02-27 MED ORDER — LIRAGLUTIDE -WEIGHT MANAGEMENT 18 MG/3ML ~~LOC~~ SOPN: 3.0000 mg | PEN_INJECTOR | Freq: Every day | SUBCUTANEOUS | 2 refills | Status: DC

## 2018-03-27 ENCOUNTER — Ambulatory Visit (INDEPENDENT_AMBULATORY_CARE_PROVIDER_SITE_OTHER): Payer: 59 | Admitting: Family Medicine

## 2018-03-27 VITALS — BP 120/56 | HR 72 | Wt 237.0 lb

## 2018-03-27 DIAGNOSIS — R635 Abnormal weight gain: Secondary | ICD-10-CM | POA: Diagnosis not present

## 2018-03-27 DIAGNOSIS — E669 Obesity, unspecified: Secondary | ICD-10-CM | POA: Diagnosis not present

## 2018-03-27 MED ORDER — LIRAGLUTIDE -WEIGHT MANAGEMENT 18 MG/3ML ~~LOC~~ SOPN: 3.0000 mg | PEN_INJECTOR | Freq: Every day | SUBCUTANEOUS | 2 refills | Status: DC

## 2018-03-27 MED ORDER — INSULIN PEN NEEDLE 32G X 6 MM MISC: 1 refills | Status: DC

## 2018-03-27 NOTE — Progress Notes (Signed)
Patient is here for blood pressure and weight check. She is up to the 3mg  daily dose of Saxenda. Denies any trouble sleeping, palpitations, or any other medication problems. Patient has not lost weight, has remained the same. A refill for Saxenda will be sent to Provider for review. Patient advised to set a goal of at least 4 pounds weight loss keep her upcoming appointment with her PCP. Verbalized understanding, no further questions.

## 2018-03-27 NOTE — Progress Notes (Signed)
Agree with documentation as above.   [name], MD  

## 2018-05-08 ENCOUNTER — Ambulatory Visit: Payer: 59

## 2018-05-20 ENCOUNTER — Ambulatory Visit: Payer: 59 | Admitting: Family Medicine

## 2018-05-20 VITALS — BP 120/67 | HR 72 | Wt 239.0 lb

## 2018-05-20 DIAGNOSIS — E669 Obesity, unspecified: Secondary | ICD-10-CM | POA: Diagnosis not present

## 2018-05-20 MED ORDER — LIRAGLUTIDE -WEIGHT MANAGEMENT 18 MG/3ML ~~LOC~~ SOPN: 3.0000 mg | PEN_INJECTOR | Freq: Every day | SUBCUTANEOUS | 1 refills | Status: DC

## 2018-05-20 MED ORDER — INSULIN PEN NEEDLE 32G X 6 MM MISC: 1 refills | Status: DC

## 2018-05-20 NOTE — Progress Notes (Signed)
Pt advised. Verbalized understanding.

## 2018-05-20 NOTE — Progress Notes (Signed)
Patient is here for blood pressure and weight check. Denies any trouble sleeping, palpitations, or any other medication problems. Patient has not lost weight, states she has not been exercising as much since being home on quarantine. A refill for Saxenda will be sent to Provider for review, [facility]. Currently injection 3mg  daily. Patient advised I would let her know via telephone call when she is to follow up. Verbalized understanding, no further questions.

## 2018-05-20 NOTE — Progress Notes (Signed)
Agree with documentation as above.  Please have her schedule a virtual visit in 1 month so that we can touch base about the weight loss and make sure that she is staying on track.  Beatrice Lecher, MD

## 2018-07-01 ENCOUNTER — Ambulatory Visit: Payer: 59 | Admitting: Family Medicine

## 2018-07-02 ENCOUNTER — Encounter: Payer: Self-pay | Admitting: Family Medicine

## 2018-07-02 ENCOUNTER — Ambulatory Visit (INDEPENDENT_AMBULATORY_CARE_PROVIDER_SITE_OTHER): Payer: 59 | Admitting: Family Medicine

## 2018-07-02 VITALS — BP 119/70 | HR 74 | Ht 64.0 in | Wt 242.8 lb

## 2018-07-02 DIAGNOSIS — Z636 Dependent relative needing care at home: Secondary | ICD-10-CM

## 2018-07-02 DIAGNOSIS — R635 Abnormal weight gain: Secondary | ICD-10-CM | POA: Diagnosis not present

## 2018-07-02 DIAGNOSIS — Z6841 Body Mass Index (BMI) 40.0 and over, adult: Secondary | ICD-10-CM | POA: Diagnosis not present

## 2018-07-02 MED ORDER — LIRAGLUTIDE -WEIGHT MANAGEMENT 18 MG/3ML ~~LOC~~ SOPN: 3.0000 mg | PEN_INJECTOR | Freq: Every day | SUBCUTANEOUS | 1 refills | Status: DC

## 2018-07-02 NOTE — Progress Notes (Signed)
BS:113.  She reports that she has not been able to exercise as much du to having to go to appts with her mother and her spouse.Elouise Munroe, [place]

## 2018-07-02 NOTE — Progress Notes (Signed)
Virtual Visit via Video Note  I connected with Patricia Waters on 07/02/18 at 10:30 AM EDT by a video enabled telemedicine application and verified that I am speaking with the correct person using two identifiers.   I discussed the limitations of evaluation and management by telemedicine and the availability of in person appointments. The patient expressed understanding and agreed to proceed.  Pt was at home and I was in my office for the virtual visit.     Subjective:    CC:   HPI: Follow-up abnormal weight gain-she is currently on Saxenda.  She is actually gained about 3 pounds since April. BS:113.  She reports that she has not been able to exercise as much du to having to go to appts with her mother and her spouse.  She also admits she has not been eating the best over the last month since it was recently her birthday about a week ago.  Emotionally she has been doing well but she has had a lot of caregiving going on with her spouse who is had multiple abdominal issues and who will be going under endoscopy on Wednesday.  She is Artie been the emergency department a couple times in the last few months and so that has been stressful.  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  Past medical history, Surgical history, Family history not pertinant except as noted below, Social history, Allergies, and medications have been entered into the medical record, reviewed, and corrections made.   Review of Systems: No fevers, chills, night sweats, weight loss, chest pain, or shortness of breath.   Objective:    General: Speaking clearly in complete sentences without any shortness of breath.  Alert and oriented x3.  Normal judgment. No apparent acute distress. WEll groomed.    Impression and Recommendations:    Abnormal weight gain/BMI 41-just encouraged her to really get back on track with dietary choices and try to get some  regular exercise daily.  Let for her to get out and ride her bike for 15 to 20 minutes even to starting with 3 days a week.  Also getting out in the yard and working.  Which she really enjoys being outside.  And if she is been under a lot of more stress with caregiving.  Caregiver burden-overall she is actually doing really well emotionally.  Just encouraged her to make sure she is taking care of herself.  Her church is a good support network for her.  And she volunteers a lot there.      I discussed the assessment and treatment plan with the patient. The patient was provided an opportunity to ask questions and all were answered. The patient agreed with the plan and demonstrated an understanding of the instructions.   The patient was advised to call back or seek an in-person evaluation if the symptoms worsen or if the condition fails to improve as anticipated.   Beatrice Lecher, MD

## 2018-08-21 ENCOUNTER — Encounter: Payer: Self-pay | Admitting: Family Medicine

## 2018-08-21 NOTE — Telephone Encounter (Signed)
Patricia Waters can you do this?

## 2018-08-22 NOTE — Telephone Encounter (Signed)
Can you write the appeal letter? It has to come from a provider. Please advise.

## 2018-08-26 NOTE — Telephone Encounter (Signed)
Letter printed. Please fax.

## 2018-09-17 ENCOUNTER — Ambulatory Visit: Payer: 59 | Admitting: Family Medicine

## 2018-09-17 ENCOUNTER — Other Ambulatory Visit: Payer: Self-pay

## 2018-09-17 ENCOUNTER — Encounter: Payer: Self-pay | Admitting: Family Medicine

## 2018-09-17 VITALS — BP 126/63 | HR 80 | Ht 64.0 in | Wt 240.0 lb

## 2018-09-17 DIAGNOSIS — R7301 Impaired fasting glucose: Secondary | ICD-10-CM | POA: Diagnosis not present

## 2018-09-17 DIAGNOSIS — Z0000 Encounter for general adult medical examination without abnormal findings: Secondary | ICD-10-CM | POA: Diagnosis not present

## 2018-09-17 DIAGNOSIS — Z6841 Body Mass Index (BMI) 40.0 and over, adult: Secondary | ICD-10-CM

## 2018-09-17 DIAGNOSIS — Z23 Encounter for immunization: Secondary | ICD-10-CM | POA: Diagnosis not present

## 2018-09-17 DIAGNOSIS — R635 Abnormal weight gain: Secondary | ICD-10-CM | POA: Diagnosis not present

## 2018-09-17 LAB — POCT GLYCOSYLATED HEMOGLOBIN (HGB A1C): Hemoglobin A1C: 5.2 % (ref 4.0–5.6)

## 2018-09-17 MED ORDER — ROPINIROLE HCL 0.5 MG PO TABS: 0.5000 mg | ORAL_TABLET | Freq: Every day | ORAL | 3 refills | Status: DC

## 2018-09-17 NOTE — Progress Notes (Signed)
Established Patient Office Visit  Subjective:  Patient ID: Patricia Waters, female    DOB: 1955/10/14  Age: 63 y.o. MRN: [id number]  CC:  Chief Complaint  Patient presents with  . ifg  . abnormal weight gain    HPI Patricia Waters presents for   Abnormal weight gain/BMI 41 -she is currently up to 3 mg on the Saxenda.  She is tolerating it well without any significant side effects.  But still just struggling with weight loss.  She has lost about 3 pounds over the last 8 weeks.  But she also admits she has not been exercising as much.  Her spouse other had surgery and she is been doing a lot of caretaking for her.  Impaired fasting glucose-no increased thirst or urination. No symptoms consistent with hypoglycemia.   Past Medical History:  Diagnosis Date  . Breast cancer ([place])    at age 32  . Sleep apnea   . Vertigo     Past Surgical History:  Procedure Laterality Date  . BREAST LUMPECTOMY  1994   right  . CERVICAL FUSION  1995   C5-C7  . MASTECTOMY  2005   Bilat     Family History  Problem Relation Age of Onset  . Stroke Mother   . Hyperlipidemia Mother   . Skin cancer Mother   . Breast cancer Mother   . Diabetes Father   . Hyperlipidemia Father   . Heart attack Father 85  . Colon cancer Father   . Cancer Father 54       prostate  . Diabetes Paternal Grandmother   . Heart attack Paternal Grandmother   . Rheum arthritis Paternal Grandmother   . Hyperlipidemia Sister   . Heart attack Paternal Grandfather   . Heart attack Maternal Grandfather   . Heart attack Maternal Grandmother   . Skin cancer Maternal Grandmother   . Rheum arthritis Maternal Grandmother   . Breast cancer Sister   . Lymphoma Maternal Aunt   . Kidney cancer Maternal Aunt     Social History   Socioeconomic History  . Marital status: Married    Spouse name: [name]  . Number of children: Not on file  . Years of education: Not on file  . Highest education level: Not on file   Occupational History  . 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 Financial resource strain: Not on file  . Food insecurity    Worry: Not on file    Inability: Not on file  . Transportation needs    Medical: Not on file    Non-medical: Not on file  Tobacco Use  . Smoking status: Former Smoker    Types: Cigarettes    Quit date: 10/31/2006    Years since quitting: 11.8  . Smokeless tobacco: Never Used  Substance and Sexual Activity  . Alcohol use: No  . Drug use: No  . Sexual activity: Not Currently    Partners: Female    Birth control/protection: None  Lifestyle  . Physical activity    Days per week: Not on file    Minutes per session: Not on file  . Stress: Not on file  Relationships  . Social Herbalist on phone: Not on file    Gets together: Not on file    Attends religious service: Not on file    Active member of club or organization: Not on file  Attends meetings of clubs or organizations: Not on file    Relationship status: Not on file  . Intimate partner violence    Fear of current or ex partner: Not on file    Emotionally abused: Not on file    Physically abused: Not on file    Forced sexual activity: Not on file  Other Topics Concern  . Not on file  Social History Narrative   Lives with her partner, [name]. Former Therapist, nutritional.     Outpatient Medications Prior to Visit  Medication Sig Dispense Refill  . calcium carbonate (OS-CAL) 600 MG TABS Take 600 mg by mouth daily.      . cholecalciferol (VITAMIN D) 1000 UNITS tablet Take 1,000 Units by mouth daily.      . DULoxetine (CYMBALTA) 30 MG capsule TAKE 1 CAPSULE BY MOUTH  DAILY 90 capsule 3  . fluticasone (FLONASE) 50 MCG/ACT nasal spray Place into both nostrils daily.    Marland Kitchen gabapentin (NEURONTIN) 300 MG capsule TAKE 2 CAPSULES BY MOUTH  DAILY 180 capsule 2  . Insulin Pen Needle (BD PEN NEEDLE MICRO U/F) 32G X 6 MM MISC 1 EACH BY DOES NOT APPLY ROUTE  DAILY. USE TO INJECT SAXENDA, ONCE DAILY. 100 each 1  . Krill Oil 1000 MG CAPS Take by mouth.    . Liraglutide -Weight Management (SAXENDA) 18 MG/3ML SOPN Inject 3 mg into the skin daily. 6 pen 1  . Multiple Vitamins-Minerals (VITEYES AREDS FORMULA PO) Take by mouth daily.    . vitamin C (ASCORBIC ACID) 500 MG tablet Take 500 mg by mouth daily.      Marland Kitchen rOPINIRole (REQUIP) 0.5 MG tablet TAKE 1 TABLET BY MOUTH AT  BEDTIME 90 tablet 3   No facility-administered medications prior to visit.     No Known Allergies  ROS Review of Systems    Objective:    Physical Exam  BP 126/63   Pulse 80   Ht '5\' 4"'  (1.626 m)   Wt 240 lb (108.9 kg)   SpO2 97%   BMI 41.20 kg/m  Wt Readings from Last 3 Encounters:  09/17/18 240 lb (108.9 kg)  07/02/18 242 lb 12.8 oz (110.1 kg)  05/20/18 239 lb (108.4 kg)     Health Maintenance Due  Topic Date Due  . INFLUENZA VACCINE  08/31/2018    There are no preventive care reminders to display for this patient.  Lab Results  Component Value Date   TSH 2.88 09/07/2015   Lab Results  Component Value Date   WBC 9.8 09/07/2015   HGB 12.3 09/07/2015   HCT 37.2 09/07/2015   MCV 92.8 09/07/2015   PLT 310 09/07/2015   Lab Results  Component Value Date   NA 139 01/02/2018   K 4.5 01/02/2018   CHLORIDE 103 01/20/2014   CO2 30 01/02/2018   GLUCOSE 88 01/02/2018   BUN 11 01/02/2018   CREATININE 0.75 01/02/2018   BILITOT 0.4 01/02/2018   ALKPHOS 111 11/25/2015   AST 18 01/02/2018   ALT 13 01/02/2018   PROT 7.0 01/02/2018   ALBUMIN 4.1 11/25/2015   CALCIUM 9.9 01/02/2018   ANIONGAP 10 01/20/2014   EGFR 77 (L) 01/20/2014   Lab Results  Component Value Date   CHOL 201 (H) 01/02/2018   Lab Results  Component Value Date   HDL 54 01/02/2018   Lab Results  Component Value Date   LDLCALC 119 (H) 01/02/2018   Lab Results  Component Value Date   TRIG 165 (  H) 01/02/2018   Lab Results  Component Value Date   CHOLHDL 3.7 01/02/2018   Lab  Results  Component Value Date   HGBA1C 5.2 01/02/2018      Assessment & Plan:   Problem List Items Addressed This Visit      Endocrine   IFG (impaired fasting glucose) - Primary    Well controlled. Continue current regimen. Follow up in  6 months.       Relevant Orders   POCT glycosylated hemoglobin (Hb A1C)     Other   Abnormal weight gain    Continue her Saxenda.  Discussed dietary goals and exercise.    Diet goal:she wants to increase her veggie intake.  Exercise goal: Her to get out and ride her bike or walk around her neighborhood.  Even if is just for 15 or 20 minutes just encouraged her to really put some focus on this. Medication: Saxenda 3 mg. Follow-up: 8 weeks       Other Visit Diagnoses    BMI 40.0-44.9, adult ([place])       Wellness examination       Relevant Medications   rOPINIRole (REQUIP) 0.5 MG tablet   Need for immunization against influenza       Relevant Orders   Flu Vaccine QUAD 36+ mos IM (Completed)      Meds ordered this encounter  Medications  . rOPINIRole (REQUIP) 0.5 MG tablet    Sig: Take 1 tablet (0.5 mg total) by mouth at bedtime.    Dispense:  90 tablet    Refill:  3    Follow-up: Return in about 2 months (around 11/17/2018) for weight check with PCP.  Marland Kitchen    Beatrice Lecher, MD

## 2018-09-17 NOTE — Assessment & Plan Note (Signed)
Well controlled. Continue current regimen. Follow up in  6 months.  

## 2018-09-17 NOTE — Assessment & Plan Note (Signed)
Continue her Saxenda.  Discussed dietary goals and exercise.    Diet goal:she wants to increase her veggie intake.  Exercise goal: Her to get out and ride her bike or walk around her neighborhood.  Even if is just for 15 or 20 minutes just encouraged her to really put some focus on this. Medication: Saxenda 3 mg. Follow-up: 8 weeks

## 2018-10-11 ENCOUNTER — Ambulatory Visit (INDEPENDENT_AMBULATORY_CARE_PROVIDER_SITE_OTHER): Payer: 59

## 2018-10-11 ENCOUNTER — Other Ambulatory Visit: Payer: Self-pay

## 2018-10-11 ENCOUNTER — Ambulatory Visit: Payer: 59 | Admitting: Sports Medicine

## 2018-10-11 DIAGNOSIS — M7701 Medial epicondylitis, right elbow: Secondary | ICD-10-CM | POA: Diagnosis not present

## 2018-10-11 NOTE — Progress Notes (Signed)
Subjective:    CC: Right elbow pain  HPI: For several weeks this pleasant 63 year old female with a history of stage I breast cancer post radical mastectomy and lymph node dissection and subsequent right arm lymphedema has had pain that she localizes in the medial aspect of her right elbow, she did some painting, and has also done some digging.  She has tried some oral NSAIDs, analgesics without much improvement, pain is moderate, persistent, localized without radiation.  I reviewed the past medical history, family history, social history, surgical history, and allergies today and no changes were needed.  Please see the problem list section below in epic for further details.  Past Medical History: Past Medical History:  Diagnosis Date  . Breast cancer ([place])    at age 38  . Sleep apnea   . Vertigo    Past Surgical History: Past Surgical History:  Procedure Laterality Date  . BREAST LUMPECTOMY  1994   right  . CERVICAL FUSION  1995   C5-C7  . MASTECTOMY  2005   Bilat    Social History: Social History   Socioeconomic History  . Marital status: Married    Spouse name: [name]  . Number of children: Not on file  . Years of education: Not on file  . Highest education level: Not on file  Occupational History  . 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 Financial resource strain: Not on file  . Food insecurity    Worry: Not on file    Inability: Not on file  . Transportation needs    Medical: Not on file    Non-medical: Not on file  Tobacco Use  . Smoking status: Former Smoker    Types: Cigarettes    Quit date: 10/31/2006    Years since quitting: 11.9  . Smokeless tobacco: Never Used  Substance and Sexual Activity  . Alcohol use: No  . Drug use: No  . Sexual activity: Not Currently    Partners: Female    Birth control/protection: None  Lifestyle  . Physical activity    Days per week: Not on file    Minutes per session:  Not on file  . Stress: Not on file  Relationships  . Social Herbalist on phone: Not on file    Gets together: Not on file    Attends religious service: Not on file    Active member of club or organization: Not on file    Attends meetings of clubs or organizations: Not on file    Relationship status: Not on file  Other Topics Concern  . Not on file  Social History Narrative   Lives with her partner, [name]. Former Therapist, nutritional.    Family History: Family History  Problem Relation Age of Onset  . Stroke Mother   . Hyperlipidemia Mother   . Skin cancer Mother   . Breast cancer Mother   . Diabetes Father   . Hyperlipidemia Father   . Heart attack Father 69  . Colon cancer Father   . Cancer Father 83       prostate  . Diabetes Paternal Grandmother   . Heart attack Paternal Grandmother   . Rheum arthritis Paternal Grandmother   . Hyperlipidemia Sister   . Heart attack Paternal Grandfather   . Heart attack Maternal Grandfather   . Heart attack Maternal Grandmother   . Skin cancer Maternal Grandmother   . Rheum  arthritis Maternal Grandmother   . Breast cancer Sister   . Lymphoma Maternal Aunt   . Kidney cancer Maternal Aunt    Allergies: No Known Allergies Medications: See med rec.  Review of Systems: No fevers, chills, night sweats, weight loss, chest pain, or shortness of breath.   Objective:    General: Well Developed, well nourished, and in no acute distress.  Neuro: Alert and oriented x3, extra-ocular muscles intact, sensation grossly intact.  HEENT: Normocephalic, atraumatic, pupils equal round reactive to light, neck supple, no masses, no lymphadenopathy, thyroid nonpalpable.  Skin: Warm and dry, no rashes. Cardiac: Regular rate and rhythm, no murmurs rubs or gallops, no lower extremity edema.  Respiratory: Clear to auscultation bilaterally. Not using accessory muscles, speaking in full sentences. Right elbow: Unremarkable to inspection. Range of  motion full pronation, supination, flexion, extension. Strength is full to all of the above directions Stable to varus, valgus stress. Negative moving valgus stress test. Tender to palpation at the common flexor origin Ulnar nerve does not sublux. Negative cubital tunnel Tinel's.  Procedure: Real-time Ultrasound Guided injection of the right common flexor origin Device: GE Logiq E  Verbal informed consent obtained.  Time-out conducted.  Noted no overlying erythema, induration, or other signs of local infection.  Skin prepped in a sterile fashion.  Local anesthesia: Topical Ethyl chloride.  With sterile technique and under real time ultrasound guidance:  25-gauge needle advanced to the origin of the right common flexor at the medial epicondyle, I placed medication both superficial to and deep to the insertion of the tendon for a total of 1 cc Kenalog 40, 1 cc lidocaine, 1 cc bupivacaine. Completed without difficulty  Pain immediately resolved suggesting accurate placement of the medication.  Advised to call if fevers/chills, erythema, induration, drainage, or persistent bleeding.  Images permanently stored and available for review in the ultrasound unit.  Impression: Technically successful ultrasound guided injection.  Impression and Recommendations:    Medial epicondylitis, right Present now for 7 weeks. Medial epicondyle injection as above, rehab exercises, x-rays. Return to see me in 6 weeks.   ___________________________________________ Gwen Her. Dianah Field, M.D., ABFM., CAQSM. Primary Care and Sports Medicine [facility]  Adjunct Professor of [employer] [facility] [employer]

## 2018-10-11 NOTE — Assessment & Plan Note (Signed)
Present now for 7 weeks. Medial epicondyle injection as above, rehab exercises, x-rays. Return to see me in 6 weeks.

## 2018-11-09 ENCOUNTER — Other Ambulatory Visit: Payer: Self-pay | Admitting: Family Medicine

## 2018-11-18 ENCOUNTER — Ambulatory Visit: Payer: 59 | Admitting: Family Medicine

## 2018-11-18 ENCOUNTER — Encounter: Payer: Self-pay | Admitting: Family Medicine

## 2018-11-18 ENCOUNTER — Other Ambulatory Visit: Payer: Self-pay

## 2018-11-18 VITALS — BP 124/68 | HR 71 | Ht 64.0 in | Wt 243.0 lb

## 2018-11-18 DIAGNOSIS — Z6841 Body Mass Index (BMI) 40.0 and over, adult: Secondary | ICD-10-CM

## 2018-11-18 DIAGNOSIS — R635 Abnormal weight gain: Secondary | ICD-10-CM | POA: Diagnosis not present

## 2018-11-18 DIAGNOSIS — R7301 Impaired fasting glucose: Secondary | ICD-10-CM

## 2018-11-18 MED ORDER — BD PEN NEEDLE MICRO U/F 32G X 6 MM MISC: 1 refills | Status: DC

## 2018-11-18 MED ORDER — SAXENDA 18 MG/3ML ~~LOC~~ SOPN: 3.0000 mg | PEN_INJECTOR | Freq: Every day | SUBCUTANEOUS | 1 refills | Status: DC

## 2018-11-18 NOTE — Patient Instructions (Signed)
Try to find a friend in your neighborhood that you could exercise or walk with. Schedule/plan your exercise time. Consider planning your meals for the entire week so that you can make sure that you are balancing healthy choices. You to work on increasing vegetables in the diet to help you feel more full.

## 2018-11-18 NOTE — Progress Notes (Signed)
Established Patient Office Visit  Subjective:  Patient ID: Patricia Waters, female    DOB: Sep 11, 1955  Age: 63 y.o. MRN: [id number]  CC:  Chief Complaint  Patient presents with  . Weight Check    HPI Patricia Waters presents for   Impaired fasting glucose-no increased thirst or urination. No symptoms consistent with hypoglycemia.  She also wants to follow-up on her weight.  Current BMI is 41.  She has lost 3 pounds since she saw Dr. Dianah Field about a month ago.  She still on 3 mg of the Saxenda she is tolerating it well without any significant side effects or significant nausea but she still struggling with food choices and portion control.  She has also not been exercising.  She is the primary caregiver for her parents as well as her spouse and more recently her spouses mother had a stroke.     Past Medical History:  Diagnosis Date  . Breast cancer ([place])    at age 25  . Sleep apnea   . Vertigo     Past Surgical History:  Procedure Laterality Date  . BREAST LUMPECTOMY  1994   right  . CERVICAL FUSION  1995   C5-C7  . MASTECTOMY  2005   Bilat     Family History  Problem Relation Age of Onset  . Stroke Mother   . Hyperlipidemia Mother   . Skin cancer Mother   . Breast cancer Mother   . Diabetes Father   . Hyperlipidemia Father   . Heart attack Father 64  . Colon cancer Father   . Cancer Father 59       prostate  . Diabetes Paternal Grandmother   . Heart attack Paternal Grandmother   . Rheum arthritis Paternal Grandmother   . Hyperlipidemia Sister   . Heart attack Paternal Grandfather   . Heart attack Maternal Grandfather   . Heart attack Maternal Grandmother   . Skin cancer Maternal Grandmother   . Rheum arthritis Maternal Grandmother   . Breast cancer Sister   . Lymphoma Maternal Aunt   . Kidney cancer Maternal Aunt     Social History   Socioeconomic History  . Marital status: Married    Spouse name: [name]  . Number of children: Not on file   . Years of education: Not on file  . Highest education level: Not on file  Occupational History  . 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 Financial resource strain: Not on file  . Food insecurity    Worry: Not on file    Inability: Not on file  . Transportation needs    Medical: Not on file    Non-medical: Not on file  Tobacco Use  . Smoking status: Former Smoker    Types: Cigarettes    Quit date: 10/31/2006    Years since quitting: 12.0  . Smokeless tobacco: Never Used  Substance and Sexual Activity  . Alcohol use: No  . Drug use: No  . Sexual activity: Not Currently    Partners: Female    Birth control/protection: None  Lifestyle  . Physical activity    Days per week: Not on file    Minutes per session: Not on file  . Stress: Not on file  Relationships  . Social Herbalist on phone: Not on file    Gets together: Not on file    Attends religious service: Not  on file    Active member of club or organization: Not on file    Attends meetings of clubs or organizations: Not on file    Relationship status: Not on file  . Intimate partner violence    Fear of current or ex partner: Not on file    Emotionally abused: Not on file    Physically abused: Not on file    Forced sexual activity: Not on file  Other Topics Concern  . Not on file  Social History Narrative   Lives with her partner, [name]. Former Therapist, nutritional.     Outpatient Medications Prior to Visit  Medication Sig Dispense Refill  . calcium carbonate (OS-CAL) 600 MG TABS Take 600 mg by mouth daily.      . cholecalciferol (VITAMIN D) 1000 UNITS tablet Take 1,000 Units by mouth daily.      . DULoxetine (CYMBALTA) 30 MG capsule TAKE 1 CAPSULE BY MOUTH  DAILY 90 capsule 3  . fluticasone (FLONASE) 50 MCG/ACT nasal spray Place into both nostrils daily.    Marland Kitchen gabapentin (NEURONTIN) 300 MG capsule TAKE 2 CAPSULES BY MOUTH  DAILY 180 capsule 2  . Krill Oil 1000  MG CAPS Take by mouth.    . Multiple Vitamins-Minerals (ICAPS AREDS FORMULA PO) Take 2 capsules by mouth daily.    . Multiple Vitamins-Minerals (VITEYES AREDS FORMULA PO) Take by mouth daily.    Marland Kitchen rOPINIRole (REQUIP) 0.5 MG tablet Take 1 tablet (0.5 mg total) by mouth at bedtime. 90 tablet 3  . vitamin C (ASCORBIC ACID) 500 MG tablet Take 500 mg by mouth daily.      . Insulin Pen Needle (BD PEN NEEDLE MICRO U/F) 32G X 6 MM MISC 1 EACH BY DOES NOT APPLY ROUTE DAILY. USE TO INJECT SAXENDA, ONCE DAILY. 100 each 1  . Liraglutide -Weight Management (SAXENDA) 18 MG/3ML SOPN Inject 3 mg into the skin daily. 6 pen 1   No facility-administered medications prior to visit.     No Known Allergies  ROS Review of Systems    Objective:    Physical Exam  Constitutional: She is oriented to person, place, and time. She appears well-developed and well-nourished.  HENT:  Head: Normocephalic and atraumatic.  Cardiovascular: Normal rate, regular rhythm and normal heart sounds.  Pulmonary/Chest: Effort normal and breath sounds normal.  Neurological: She is alert and oriented to person, place, and time.  Skin: Skin is warm and dry.  Psychiatric: She has a normal mood and affect. Her behavior is normal.    BP 124/68   Pulse 71   Ht '5\' 4"'  (1.626 m)   Wt 243 lb (110.2 kg)   SpO2 98%   BMI 41.71 kg/m  Wt Readings from Last 3 Encounters:  11/18/18 243 lb (110.2 kg)  10/11/18 246 lb (111.6 kg)  09/17/18 240 lb (108.9 kg)     Health Maintenance Due  Topic Date Due  . TETANUS/TDAP  10/17/2018    There are no preventive care reminders to display for this patient.  Lab Results  Component Value Date   TSH 2.88 09/07/2015   Lab Results  Component Value Date   WBC 9.8 09/07/2015   HGB 12.3 09/07/2015   HCT 37.2 09/07/2015   MCV 92.8 09/07/2015   PLT 310 09/07/2015   Lab Results  Component Value Date   NA 139 01/02/2018   K 4.5 01/02/2018   CHLORIDE 103 01/20/2014   CO2 30 01/02/2018    GLUCOSE 88 01/02/2018  BUN 11 01/02/2018   CREATININE 0.75 01/02/2018   BILITOT 0.4 01/02/2018   ALKPHOS 111 11/25/2015   AST 18 01/02/2018   ALT 13 01/02/2018   PROT 7.0 01/02/2018   ALBUMIN 4.1 11/25/2015   CALCIUM 9.9 01/02/2018   ANIONGAP 10 01/20/2014   EGFR 77 (L) 01/20/2014   Lab Results  Component Value Date   CHOL 201 (H) 01/02/2018   Lab Results  Component Value Date   HDL 54 01/02/2018   Lab Results  Component Value Date   LDLCALC 119 (H) 01/02/2018   Lab Results  Component Value Date   TRIG 165 (H) 01/02/2018   Lab Results  Component Value Date   CHOLHDL 3.7 01/02/2018   Lab Results  Component Value Date   HGBA1C 5.2 09/17/2018      Assessment & Plan:   Problem List Items Addressed This Visit      Endocrine   IFG (impaired fasting glucose) - Primary    Lab Results  Component Value Date   HGBA1C 5.2 09/17/2018           Other   Abnormal weight gain    Really discussed some strategies around trying to get her more motivated.  Consider something like NOOM.  I discussed starting to exercise with a neighbor friend who might also be interested in walking or biking.  Also discussed really trying to again try to work on increasing her vegetable intake and setting small goals for herself.  Also writing things on her calendar and having a plan for the week as well as planning meals for the week.       Other Visit Diagnoses    BMI 40.0-44.9, adult (HCC)       Relevant Medications   Liraglutide -Weight Management (SAXENDA) 18 MG/3ML SOPN     Current weight: 243 pounds Weight change: Down 3 pounds in 1 month. Diet goal:Meal planning for the week. Exercise goal: Ask a neighbor to walk or bike with her neighborhood.  Even if is just for 15 or 20 minutes just encouraged her to really put some focus on this. Medication: Saxenda 3 mg.  Discussed shooting for about a half a pound per week at a minimum. Follow-up: 8 weeks  Meds ordered this encounter   Medications  . Liraglutide -Weight Management (SAXENDA) 18 MG/3ML SOPN    Sig: Inject 3 mg into the skin daily.    Dispense:  6 pen    Refill:  1  . Insulin Pen Needle (BD PEN NEEDLE MICRO U/F) 32G X 6 MM MISC    Sig: 1 EACH BY DOES NOT APPLY ROUTE DAILY. USE TO INJECT SAXENDA, ONCE DAILY.    Dispense:  100 each    Refill:  1    Follow-up: Return in about 2 months (around 01/18/2019) for weight check .   Time spent 25 minutes, greater than 50% of the time spent face-to-face counseling about morbid obesity/BMI 41 and impaired fasting glucose.  Beatrice Lecher, MD

## 2018-11-18 NOTE — Assessment & Plan Note (Signed)
Really discussed some strategies around trying to get her more motivated.  Consider something like NOOM.  I discussed starting to exercise with a neighbor friend who might also be interested in walking or biking.  Also discussed really trying to again try to work on increasing her vegetable intake and setting small goals for herself.  Also writing things on her calendar and having a plan for the week as well as planning meals for the week.

## 2018-11-18 NOTE — Assessment & Plan Note (Signed)
Lab Results  Component Value Date   HGBA1C 5.2 09/17/2018

## 2018-11-22 ENCOUNTER — Ambulatory Visit (INDEPENDENT_AMBULATORY_CARE_PROVIDER_SITE_OTHER): Payer: 59 | Admitting: Sports Medicine

## 2018-11-22 ENCOUNTER — Other Ambulatory Visit: Payer: Self-pay

## 2018-11-22 DIAGNOSIS — M7701 Medial epicondylitis, right elbow: Secondary | ICD-10-CM

## 2018-11-22 NOTE — Progress Notes (Signed)
Subjective:    CC: Follow-up  HPI: Right medial epicondylitis: Resolved after injection.  I reviewed the past medical history, family history, social history, surgical history, and allergies today and no changes were needed.  Please see the problem list section below in epic for further details.  Past Medical History: Past Medical History:  Diagnosis Date  . Breast cancer ([place])    at age 64  . Sleep apnea   . Vertigo    Past Surgical History: Past Surgical History:  Procedure Laterality Date  . BREAST LUMPECTOMY  1994   right  . CERVICAL FUSION  1995   C5-C7  . MASTECTOMY  2005   Bilat    Social History: Social History   Socioeconomic History  . Marital status: Married    Spouse name: [name]  . Number of children: Not on file  . Years of education: Not on file  . Highest education level: Not on file  Occupational History  . 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 Financial resource strain: Not on file  . Food insecurity    Worry: Not on file    Inability: Not on file  . Transportation needs    Medical: Not on file    Non-medical: Not on file  Tobacco Use  . Smoking status: Former Smoker    Types: Cigarettes    Quit date: 10/31/2006    Years since quitting: 12.0  . Smokeless tobacco: Never Used  Substance and Sexual Activity  . Alcohol use: No  . Drug use: No  . Sexual activity: Not Currently    Partners: Female    Birth control/protection: None  Lifestyle  . Physical activity    Days per week: Not on file    Minutes per session: Not on file  . Stress: Not on file  Relationships  . Social Herbalist on phone: Not on file    Gets together: Not on file    Attends religious service: Not on file    Active member of club or organization: Not on file    Attends meetings of clubs or organizations: Not on file    Relationship status: Not on file  Other Topics Concern  . Not on file  Social History  Narrative   Lives with her partner, [name]. Former Therapist, nutritional.    Family History: Family History  Problem Relation Age of Onset  . Stroke Mother   . Hyperlipidemia Mother   . Skin cancer Mother   . Breast cancer Mother   . Diabetes Father   . Hyperlipidemia Father   . Heart attack Father 66  . Colon cancer Father   . Cancer Father 47       prostate  . Diabetes Paternal Grandmother   . Heart attack Paternal Grandmother   . Rheum arthritis Paternal Grandmother   . Hyperlipidemia Sister   . Heart attack Paternal Grandfather   . Heart attack Maternal Grandfather   . Heart attack Maternal Grandmother   . Skin cancer Maternal Grandmother   . Rheum arthritis Maternal Grandmother   . Breast cancer Sister   . Lymphoma Maternal Aunt   . Kidney cancer Maternal Aunt    Allergies: No Known Allergies Medications: See med rec.  Review of Systems: No fevers, chills, night sweats, weight loss, chest pain, or shortness of breath.   Objective:    General: Well Developed, well nourished, and in no acute  distress.  Neuro: Alert and oriented x3, extra-ocular muscles intact, sensation grossly intact.  HEENT: Normocephalic, atraumatic, pupils equal round reactive to light, neck supple, no masses, no lymphadenopathy, thyroid nonpalpable.  Skin: Warm and dry, no rashes. Cardiac: Regular rate and rhythm, no murmurs rubs or gallops, no lower extremity edema.  Respiratory: Clear to auscultation bilaterally. Not using accessory muscles, speaking in full sentences. Right elbow: Unremarkable to inspection. Range of motion full pronation, supination, flexion, extension. Strength is full to all of the above directions Stable to varus, valgus stress. Negative moving valgus stress test. No discrete areas of tenderness to palpation. Ulnar nerve does not sublux. Negative cubital tunnel Tinel's.  Impression and Recommendations:    Medial epicondylitis, right Completely resolved after medial  epicondyle injection at the last visit, return as needed.  Continue exercises.   ___________________________________________ Gwen Her. Dianah Field, M.D., ABFM., CAQSM. Primary Care and Sports Medicine [facility]  Adjunct Professor of [place] [employer] [facility] [employer] Medicine

## 2018-11-22 NOTE — Assessment & Plan Note (Signed)
Completely resolved after medial epicondyle injection at the last visit, return as needed.  Continue exercises.

## 2019-01-14 ENCOUNTER — Other Ambulatory Visit: Payer: Self-pay | Admitting: Family Medicine

## 2019-01-20 ENCOUNTER — Other Ambulatory Visit: Payer: Self-pay

## 2019-01-20 ENCOUNTER — Encounter: Payer: Self-pay | Admitting: Family Medicine

## 2019-01-20 ENCOUNTER — Ambulatory Visit (INDEPENDENT_AMBULATORY_CARE_PROVIDER_SITE_OTHER): Payer: 59 | Admitting: Family Medicine

## 2019-01-20 ENCOUNTER — Telehealth: Payer: Self-pay | Admitting: Family Medicine

## 2019-01-20 VITALS — BP 137/69 | HR 71 | Ht 64.0 in | Wt 243.0 lb

## 2019-01-20 DIAGNOSIS — G4733 Obstructive sleep apnea (adult) (pediatric): Secondary | ICD-10-CM | POA: Diagnosis not present

## 2019-01-20 DIAGNOSIS — Z6841 Body Mass Index (BMI) 40.0 and over, adult: Secondary | ICD-10-CM | POA: Diagnosis not present

## 2019-01-20 DIAGNOSIS — R635 Abnormal weight gain: Secondary | ICD-10-CM

## 2019-01-20 NOTE — Assessment & Plan Note (Addendum)
Will call to get download from CPAP to make sure that it is adequate and there is not too many leaks and that her AHI is within normal limits.  Also discussed trying to avoid sleeping on her back which can worsen snoring.

## 2019-01-20 NOTE — Telephone Encounter (Signed)
Is please call [facility] and see if they can send [name] a download off of her CPAP machine.

## 2019-01-20 NOTE — Progress Notes (Addendum)
Established Patient Office Visit  Subjective:  Patient ID: Patricia Waters, female    DOB: October 31, 1955  Age: 63 y.o. MRN: [id number]  CC:  Chief Complaint  Patient presents with  . Weight Check    HPI Patricia Waters presents for   F/U abnormal weight gain -is down 2 pounds.  She reports she is actually little bit more down on her home scale.  She is just been doing a lot of running around and helping take care of people.  Says she has been very busy.  She does not feel like she is necessarily done anything different.  BMI of 41.7 today.  She is currently on Saxenda.  Exercising some but not consistently.  Her mother has been sick with a respiratory illness for little over a week and did have a fever last week.  She has been taking food and things to their home but has been wearing a mask.  She herself has felt fine and has not been symptomatic.  He also wonders if the pressure might need to be turned up on her CPAP.  She has nasal pillows and says that her wife notices that she is snoring a little bit more but admits she has been sleeping on her back a little bit more.  [facility].  Past Medical History:  Diagnosis Date  . Breast cancer ([place])    at age 64  . Sleep apnea   . Vertigo     Past Surgical History:  Procedure Laterality Date  . BREAST LUMPECTOMY  1994   right  . CERVICAL FUSION  1995   C5-C7  . MASTECTOMY  2005   Bilat     Family History  Problem Relation Age of Onset  . Stroke Mother   . Hyperlipidemia Mother   . Skin cancer Mother   . Breast cancer Mother   . Diabetes Father   . Hyperlipidemia Father   . Heart attack Father 58  . Colon cancer Father   . Cancer Father 37       prostate  . Diabetes Paternal Grandmother   . Heart attack Paternal Grandmother   . Rheum arthritis Paternal Grandmother   . Hyperlipidemia Sister   . Heart attack Paternal Grandfather   . Heart attack Maternal Grandfather   .  Heart attack Maternal Grandmother   . Skin cancer Maternal Grandmother   . Rheum arthritis Maternal Grandmother   . Breast cancer Sister   . Lymphoma Maternal Aunt   . Kidney cancer Maternal Aunt     Social History   Socioeconomic History  . Marital status: Married    Spouse name: [name]  . Number of children: Not on file  . Years of education: Not on file  . Highest education level: Not on file  Occupational History  . Occupation: Radio broadcast assistant: [employer]    Comment: [employer]  Tobacco Use  . Smoking status: Former Smoker    Types: Cigarettes    Quit date: 10/31/2006    Years since quitting: 12.2  . Smokeless tobacco: Never Used  Substance and Sexual Activity  . Alcohol use: No  . Drug use: No  . Sexual activity: Not Currently    Partners: Female    Birth control/protection: None  Other Topics Concern  . Not on file  Social History Narrative   Lives with her partner, [name]. Former Therapist, nutritional.    Social Determinants of Health  Financial Resource Strain:   . Difficulty of Paying Living Expenses: Not on file  Food Insecurity:   . Worried About Charity fundraiser in the Last Year: Not on file  . Ran Out of Food in the Last Year: Not on file  Transportation Needs:   . Lack of Transportation (Medical): Not on file  . Lack of Transportation (Non-Medical): Not on file  Physical Activity:   . Days of Exercise per Week: Not on file  . Minutes of Exercise per Session: Not on file  Stress:   . Feeling of Stress : Not on file  Social Connections:   . Frequency of Communication with Friends and Family: Not on file  . Frequency of Social Gatherings with Friends and Family: Not on file  . Attends Religious Services: Not on file  . Active Member of Clubs or Organizations: Not on file  . Attends Archivist Meetings: Not on file  . Marital Status: Not on file  Intimate Partner Violence:   . Fear of Current or Ex-Partner: Not on file  . Emotionally  Abused: Not on file  . Physically Abused: Not on file  . Sexually Abused: Not on file    Outpatient Medications Prior to Visit  Medication Sig Dispense Refill  . calcium carbonate (OS-CAL) 600 MG TABS Take 600 mg by mouth daily.      . cholecalciferol (VITAMIN D) 1000 UNITS tablet Take 1,000 Units by mouth daily.      . DULoxetine (CYMBALTA) 30 MG capsule TAKE 1 CAPSULE BY MOUTH  DAILY 90 capsule 3  . fluticasone (FLONASE) 50 MCG/ACT nasal spray Place into both nostrils daily.    Marland Kitchen gabapentin (NEURONTIN) 300 MG capsule TAKE 2 CAPSULES BY MOUTH  DAILY 180 capsule 2  . imiquimod (ALDARA) 5 % cream Apply topically 3 (three) times a week.    . Insulin Pen Needle (BD PEN NEEDLE MICRO U/F) 32G X 6 MM MISC 1 EACH BY DOES NOT APPLY ROUTE DAILY. USE TO INJECT SAXENDA, ONCE DAILY. 100 each 1  . Krill Oil 1000 MG CAPS Take by mouth.    . Multiple Vitamins-Minerals (ICAPS AREDS FORMULA PO) Take 2 capsules by mouth daily.    . Multiple Vitamins-Minerals (VITEYES AREDS FORMULA PO) Take by mouth daily.    Marland Kitchen rOPINIRole (REQUIP) 0.5 MG tablet Take 1 tablet (0.5 mg total) by mouth at bedtime. 90 tablet 3  . SAXENDA 18 MG/3ML SOPN INJECT 3 MG INTO THE SKIN DAILY. 15 mL 1  . vitamin C (ASCORBIC ACID) 500 MG tablet Take 500 mg by mouth daily.       No facility-administered medications prior to visit.    No Known Allergies  ROS Review of Systems    Objective:    Physical Exam  Constitutional: She is oriented to person, place, and time. She appears well-developed and well-nourished.  HENT:  Head: Normocephalic and atraumatic.  Cardiovascular: Normal rate, regular rhythm and normal heart sounds.  Pulmonary/Chest: Effort normal and breath sounds normal.  Neurological: She is alert and oriented to person, place, and time.  Skin: Skin is warm and dry.  Psychiatric: She has a normal mood and affect. Her behavior is normal.    BP 137/69   Pulse 71   Ht '5\' 4"'  (1.626 m)   Wt 243 lb (110.2 kg)   SpO2  98%   BMI 41.71 kg/m  Wt Readings from Last 3 Encounters:  01/20/19 243 lb (110.2 kg)  11/22/18 245 lb (111.1  kg)  11/18/18 243 lb (110.2 kg)     Health Maintenance Due  Topic Date Due  . TETANUS/TDAP  10/17/2018    There are no preventive care reminders to display for this patient.  Lab Results  Component Value Date   TSH 2.88 09/07/2015   Lab Results  Component Value Date   WBC 9.8 09/07/2015   HGB 12.3 09/07/2015   HCT 37.2 09/07/2015   MCV 92.8 09/07/2015   PLT 310 09/07/2015   Lab Results  Component Value Date   NA 139 01/02/2018   K 4.5 01/02/2018   CHLORIDE 103 01/20/2014   CO2 30 01/02/2018   GLUCOSE 88 01/02/2018   BUN 11 01/02/2018   CREATININE 0.75 01/02/2018   BILITOT 0.4 01/02/2018   ALKPHOS 111 11/25/2015   AST 18 01/02/2018   ALT 13 01/02/2018   PROT 7.0 01/02/2018   ALBUMIN 4.1 11/25/2015   CALCIUM 9.9 01/02/2018   ANIONGAP 10 01/20/2014   EGFR 77 (L) 01/20/2014   Lab Results  Component Value Date   CHOL 201 (H) 01/02/2018   Lab Results  Component Value Date   HDL 54 01/02/2018   Lab Results  Component Value Date   LDLCALC 119 (H) 01/02/2018   Lab Results  Component Value Date   TRIG 165 (H) 01/02/2018   Lab Results  Component Value Date   CHOLHDL 3.7 01/02/2018   Lab Results  Component Value Date   HGBA1C 5.2 09/17/2018      Assessment & Plan:   Problem List Items Addressed This Visit      Respiratory   Sleep apnea, obstructive    Will call to get download from CPAP to make sure that it is adequate and there is not too many leaks and that her AHI is within normal limits.  Also discussed trying to avoid sleeping on her back which can worsen snoring.        Other   Abnormal weight gain - Primary    Other Visit Diagnoses    BMI 40.0-44.9, adult ([place])          Abnormal weight gain-just continue to work on try to get regular exercise and getting out and just being active taking a walk etc.  Try to be careful about  food choices over the holidays.  Continue with Saxenda.  Potential exposure to illness-just continue her face mask and be safe.  Good actually a virtual visit with her mom tomorrow.  She is can get Covid tested today.  No orders of the defined types were placed in this encounter.   Follow-up: Return in about 2 months (around 03/23/2019) for weight loss.    Beatrice Lecher, MD

## 2019-01-20 NOTE — Addendum Note (Signed)
Addended by: Beatrice Lecher D on: 01/20/2019 01:01 PM   Modules accepted: Level of Service

## 2019-01-21 NOTE — Telephone Encounter (Signed)
Called [facility], spoke with Faith, and requested download of CPAP be faxed to our office at [phone]. She states it should be sent over in 5-10 minutes.

## 2019-01-27 ENCOUNTER — Telehealth: Payer: Self-pay | Admitting: Family Medicine

## 2019-01-27 NOTE — Telephone Encounter (Signed)
Please call Patricia Waters and let her know that I did get the download from her CPAP and everything really looks fantastic.  Her AHI is 1.6, which is great.  So I really think the increase knowing is likely from spending more time sleeping on her back.

## 2019-01-28 NOTE — Telephone Encounter (Signed)
Pt advised.  She wanted to know about when it would be safe to go around her mother Patricia Waters) since she tested positive for COVID? Will her mother need to be tested again, and whether or not they will need masks whenever they visit her?   Jenny Reichmann said that Dr. Madilyn Fireman can send her a My Chart message regarding this.Elouise Munroe, [place]

## 2019-02-21 ENCOUNTER — Other Ambulatory Visit: Payer: Self-pay | Admitting: Family Medicine

## 2019-03-03 ENCOUNTER — Other Ambulatory Visit: Payer: Self-pay | Admitting: Family Medicine

## 2019-03-24 ENCOUNTER — Ambulatory Visit: Payer: 59 | Admitting: Family Medicine

## 2019-03-24 ENCOUNTER — Encounter: Payer: Self-pay | Admitting: Family Medicine

## 2019-03-24 ENCOUNTER — Other Ambulatory Visit: Payer: Self-pay

## 2019-03-24 VITALS — BP 122/61 | HR 70 | Ht 64.0 in | Wt 248.0 lb

## 2019-03-24 DIAGNOSIS — Z636 Dependent relative needing care at home: Secondary | ICD-10-CM | POA: Diagnosis not present

## 2019-03-24 DIAGNOSIS — Z23 Encounter for immunization: Secondary | ICD-10-CM

## 2019-03-24 DIAGNOSIS — Z13220 Encounter for screening for lipoid disorders: Secondary | ICD-10-CM

## 2019-03-24 DIAGNOSIS — G4733 Obstructive sleep apnea (adult) (pediatric): Secondary | ICD-10-CM | POA: Diagnosis not present

## 2019-03-24 DIAGNOSIS — R635 Abnormal weight gain: Secondary | ICD-10-CM

## 2019-03-24 DIAGNOSIS — Z6841 Body Mass Index (BMI) 40.0 and over, adult: Secondary | ICD-10-CM

## 2019-03-24 DIAGNOSIS — R7301 Impaired fasting glucose: Secondary | ICD-10-CM | POA: Diagnosis not present

## 2019-03-24 LAB — LIPID PANEL
Cholesterol: 204 mg/dL — ABNORMAL HIGH (ref <200)
HDL: 61 mg/dL (ref >=50)
LDL Cholesterol (Calc): 111 mg/dL (calc) — ABNORMAL HIGH
Non-HDL Cholesterol (Calc): 143 mg/dL (calc) — ABNORMAL HIGH (ref <130)
Total CHOL/HDL Ratio: 3.3 (calc) (ref <5.0)
Triglycerides: 197 mg/dL — ABNORMAL HIGH (ref <150)

## 2019-03-24 LAB — COMPLETE METABOLIC PANEL WITH GFR
AG Ratio: 1.4 (calc) (ref 1.0–2.5)
ALT: 21 U/L (ref 6–29)
AST: 22 U/L (ref 10–35)
Albumin: 4.2 g/dL (ref 3.6–5.1)
Alkaline phosphatase (APISO): 123 U/L (ref 37–153)
BUN: 9 mg/dL (ref 7–25)
CO2: 30 mmol/L (ref 20–32)
Calcium: 9.7 mg/dL (ref 8.6–10.4)
Chloride: 102 mmol/L (ref 98–110)
Creat: 0.78 mg/dL (ref 0.50–0.99)
GFR, Est African American: 94 mL/min/{1.73_m2} (ref >=60)
GFR, Est Non African American: 81 mL/min/{1.73_m2} (ref >=60)
Globulin: 2.9 g/dL (calc) (ref 1.9–3.7)
Glucose, Bld: 95 mg/dL (ref 65–99)
Potassium: 4.6 mmol/L (ref 3.5–5.3)
Sodium: 138 mmol/L (ref 135–146)
Total Bilirubin: 0.4 mg/dL (ref 0.2–1.2)
Total Protein: 7.1 g/dL (ref 6.1–8.1)

## 2019-03-24 LAB — POCT GLYCOSYLATED HEMOGLOBIN (HGB A1C): Hemoglobin A1C: 5.4 % (ref 4.0–5.6)

## 2019-03-24 NOTE — Assessment & Plan Note (Signed)
Review of download normal.

## 2019-03-24 NOTE — Assessment & Plan Note (Signed)
REally well controlled. F/U in 6 months. Continue Saxenda  Lab Results  Component Value Date   HGBA1C 5.4 03/24/2019

## 2019-03-24 NOTE — Progress Notes (Signed)
Established Patient Office Visit  Subjective:  Patient ID: Patricia Waters, female    DOB: [birth year]  Age: 64 y.o. MRN: [id number]  CC:  Chief Complaint  Patient presents with  . Weight Check  . ifg    HPI Patricia Waters presents for   Impaired fasting glucose-no increased thirst or urination. No symptoms consistent with hypoglycemia.  Follow-up abnormal weight gain-he is currently on Saxenda and unfortunately has gained 5 pounds since she was last here in December.  Obstructive sleep apnea-when she was here last time she was concerned that her CPAP may not be working quite as well.  We did call and get a download from her CPAP and everything actually looks fantastic.  Her AHI was at goal at 1.6.     She has been under a lot of stress and has been stress eating. Her partner, Patricia Waters just had back surgery and is recovering.  She also was  recently dx with a autoimmune neuropathy  And has been falling, hasn't been able to eat. Has been vomiting so they are considering G tube. 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   Past Medical History:  Diagnosis Date  . Breast cancer ([place])    at age 51  . Sleep apnea   . Vertigo     Past Surgical History:  Procedure Laterality Date  . BREAST LUMPECTOMY  1994   right  . CERVICAL FUSION  1995   C5-C7  . MASTECTOMY  2005   Bilat     Family History  Problem Relation Age of Onset  . Stroke Mother   . Hyperlipidemia Mother   . Skin cancer Mother   . Breast cancer Mother   . Diabetes Father   . Hyperlipidemia Father   . Heart attack Father 87  . Colon cancer Father   . Cancer Father 24       prostate  . Diabetes Paternal Grandmother   . Heart attack Paternal Grandmother   . Rheum arthritis Paternal Grandmother   . Hyperlipidemia Sister   . Heart attack Paternal Grandfather   . Heart attack Maternal Grandfather   . Heart attack Maternal Grandmother   . Skin cancer Maternal Grandmother   . Rheum arthritis Maternal  Grandmother   . Breast cancer Sister   . Lymphoma Maternal Aunt   . Kidney cancer Maternal Aunt     Social History   Socioeconomic History  . Marital status: Married    Spouse name: [name]  . Number of children: Not on file  . Years of education: Not on file  . Highest education level: Not on file  Occupational History  . Occupation: Radio broadcast assistant: [employer]    Comment: [employer]  Tobacco Use  . Smoking status: Former Smoker    Types: Cigarettes    Quit date: 10/31/2006    Years since quitting: 12.4  . Smokeless tobacco: Never Used  Substance and Sexual Activity  . Alcohol use: No  . Drug use: No  . Sexual activity: Not Currently    Partners: Female    Birth control/protection: None  Other Topics Concern  . Not on file  Social History Narrative   Lives with her partner, [name]. Former Therapist, nutritional.    Social Determinants of Health   Financial Resource Strain:   . Difficulty of Paying Living Expenses: Not on file  Food Insecurity:   . Worried About Charity fundraiser in the Last Year:  Not on file  . Ran Out of Food in the Last Year: Not on file  Transportation Needs:   . Lack of Transportation (Medical): Not on file  . Lack of Transportation (Non-Medical): Not on file  Physical Activity:   . Days of Exercise per Week: Not on file  . Minutes of Exercise per Session: Not on file  Stress:   . Feeling of Stress : Not on file  Social Connections:   . Frequency of Communication with Friends and Family: Not on file  . Frequency of Social Gatherings with Friends and Family: Not on file  . Attends Religious Services: Not on file  . Active Member of Clubs or Organizations: Not on file  . Attends Archivist Meetings: Not on file  . Marital Status: Not on file  Intimate Partner Violence:   . Fear of Current or Ex-Partner: Not on file  . Emotionally Abused: Not on file  . Physically Abused: Not on file  . Sexually Abused: Not on file    Outpatient  Medications Prior to Visit  Medication Sig Dispense Refill  . calcium carbonate (OS-CAL) 600 MG TABS Take 600 mg by mouth daily.      . cholecalciferol (VITAMIN D) 1000 UNITS tablet Take 1,000 Units by mouth daily.      . DULoxetine (CYMBALTA) 30 MG capsule TAKE 1 CAPSULE BY MOUTH  DAILY 90 capsule 0  . fluticasone (FLONASE) 50 MCG/ACT nasal spray Place into both nostrils daily.    Marland Kitchen gabapentin (NEURONTIN) 300 MG capsule TAKE 2 CAPSULES BY MOUTH  DAILY 180 capsule 2  . Insulin Pen Needle (BD PEN NEEDLE MICRO U/F) 32G X 6 MM MISC 1 EACH BY DOES NOT APPLY ROUTE DAILY. USE TO INJECT SAXENDA, ONCE DAILY. 100 each 1  . Krill Oil 1000 MG CAPS Take by mouth.    . Multiple Vitamins-Minerals (ICAPS AREDS FORMULA PO) Take 2 capsules by mouth daily.    . Multiple Vitamins-Minerals (VITEYES AREDS FORMULA PO) Take by mouth daily.    Marland Kitchen rOPINIRole (REQUIP) 0.5 MG tablet Take 1 tablet (0.5 mg total) by mouth at bedtime. 90 tablet 3  . SAXENDA 18 MG/3ML SOPN INJECT 3 MG INTO THE SKIN DAILY. 15 pen 1  . vitamin C (ASCORBIC ACID) 500 MG tablet Take 500 mg by mouth daily.      . imiquimod (ALDARA) 5 % cream Apply topically 3 (three) times a week.     No facility-administered medications prior to visit.    No Known Allergies  ROS Review of Systems    Objective:    Physical Exam  Constitutional: She is oriented to person, place, and time. She appears well-developed and well-nourished.  HENT:  Head: Normocephalic and atraumatic.  Cardiovascular: Normal rate, regular rhythm and normal heart sounds.  Pulmonary/Chest: Effort normal and breath sounds normal.  Neurological: She is alert and oriented to person, place, and time.  Skin: Skin is warm and dry.  Psychiatric: She has a normal mood and affect. Her behavior is normal.    BP 122/61   Pulse 70   Ht '5\' 4"'  (1.626 m)   Wt 248 lb (112.5 kg)   SpO2 96%   BMI 42.57 kg/m  Wt Readings from Last 3 Encounters:  03/24/19 248 lb (112.5 kg)  01/20/19 243  lb (110.2 kg)  11/22/18 245 lb (111.1 kg)     There are no preventive care reminders to display for this patient.  There are no preventive care reminders to  display for this patient.  Lab Results  Component Value Date   TSH 2.88 09/07/2015   Lab Results  Component Value Date   WBC 9.8 09/07/2015   HGB 12.3 09/07/2015   HCT 37.2 09/07/2015   MCV 92.8 09/07/2015   PLT 310 09/07/2015   Lab Results  Component Value Date   NA 139 01/02/2018   K 4.5 01/02/2018   CHLORIDE 103 01/20/2014   CO2 30 01/02/2018   GLUCOSE 88 01/02/2018   BUN 11 01/02/2018   CREATININE 0.75 01/02/2018   BILITOT 0.4 01/02/2018   ALKPHOS 111 11/25/2015   AST 18 01/02/2018   ALT 13 01/02/2018   PROT 7.0 01/02/2018   ALBUMIN 4.1 11/25/2015   CALCIUM 9.9 01/02/2018   ANIONGAP 10 01/20/2014   EGFR 77 (L) 01/20/2014   Lab Results  Component Value Date   CHOL 201 (H) 01/02/2018   Lab Results  Component Value Date   HDL 54 01/02/2018   Lab Results  Component Value Date   LDLCALC 119 (H) 01/02/2018   Lab Results  Component Value Date   TRIG 165 (H) 01/02/2018   Lab Results  Component Value Date   CHOLHDL 3.7 01/02/2018   Lab Results  Component Value Date   HGBA1C 5.4 03/24/2019      Assessment & Plan:   Problem List Items Addressed This Visit      Respiratory   Sleep apnea, obstructive    Review of download normal.          Endocrine   IFG (impaired fasting glucose) - Primary    REally well controlled. F/U in 6 months. Continue Saxenda  Lab Results  Component Value Date   HGBA1C 5.4 03/24/2019         Relevant Orders   POCT glycosylated hemoglobin (Hb A1C) (Completed)   COMPLETE METABOLIC PANEL WITH GFR   Lipid panel     Other   Caregiver stress    Offered therapy/counseling. She will think about  It.   Work on exercise to reduce stress levels instead of stress eating.       Abnormal weight gain    Continue Saxenda. She has gained about 5 lbs.  Discussed  setting goals for exercise.  WE also discussed goals of maintaining her weight for now as she is helping [place].        Other Visit Diagnoses    Need for tetanus, diphtheria, and acellular pertussis (Tdap) vaccine in patient of adolescent age or older       Relevant Orders   Tdap vaccine greater than or equal to 7yo IM (Completed)   Screening, lipid       Relevant Orders   COMPLETE METABOLIC PANEL WITH GFR   Lipid panel   BMI 40.0-44.9, adult ([place])         Tdap updated today.  No orders of the defined types were placed in this encounter.   Follow-up: Return in about 3 months (around 06/21/2019) for Weight Loss.  Beatrice Lecher, MD

## 2019-03-24 NOTE — Assessment & Plan Note (Signed)
Offered therapy/counseling. She will think about  It.   Work on exercise to reduce stress levels instead of stress eating.

## 2019-03-24 NOTE — Assessment & Plan Note (Signed)
Continue Saxenda. She has gained about 5 lbs.  Discussed setting goals for exercise.  WE also discussed goals of maintaining her weight for now as she is helping [place].

## 2019-04-28 ENCOUNTER — Ambulatory Visit: Payer: 59 | Attending: Internal Medicine

## 2019-04-28 DIAGNOSIS — Z23 Encounter for immunization: Secondary | ICD-10-CM

## 2019-04-28 NOTE — Progress Notes (Signed)
   Covid-19 Vaccination Clinic  Name:  Patricia Waters    MRN: [id number] DOB: Aug 12, 1955  04/28/2019  [name] was observed post Covid-19 immunization for 15 minutes without incident. She was provided with Vaccine Information Sheet and instruction to access the V-Safe system.   [name] was instructed to call 911 with any severe reactions post vaccine: Marland Kitchen Difficulty breathing  . Swelling of face and throat  . A fast heartbeat  . A bad rash all over body  . Dizziness and weakness   Immunizations Administered    Name Date Dose VIS Date Route   Pfizer COVID-19 Vaccine 04/28/2019 10:10 AM 0.3 mL 01/10/2019 Intramuscular   Manufacturer: [place]   Lot: [id number]   [place]: [id number]

## 2019-04-30 ENCOUNTER — Other Ambulatory Visit: Payer: Self-pay | Admitting: Family Medicine

## 2019-04-30 ENCOUNTER — Encounter: Payer: Self-pay | Admitting: Family Medicine

## 2019-04-30 NOTE — Telephone Encounter (Signed)
Yes ok for jury duty excuse letter.

## 2019-04-30 NOTE — Telephone Encounter (Signed)
Waiting on juror number. I have pended the letter. Please advise if appropriate

## 2019-05-08 ENCOUNTER — Other Ambulatory Visit: Payer: Self-pay | Admitting: Family Medicine

## 2019-05-15 ENCOUNTER — Other Ambulatory Visit: Payer: Self-pay

## 2019-05-15 ENCOUNTER — Other Ambulatory Visit: Payer: Self-pay | Admitting: Family Medicine

## 2019-05-21 ENCOUNTER — Ambulatory Visit: Payer: 59 | Attending: Internal Medicine

## 2019-05-21 DIAGNOSIS — Z23 Encounter for immunization: Secondary | ICD-10-CM

## 2019-05-21 NOTE — Progress Notes (Signed)
   Covid-19 Vaccination Clinic  Name:  AYVIANA CANTO    MRN: [id number] DOB: Nov 06, 1955  05/21/2019  Ms. Lauro was observed post Covid-19 immunization for 15 minutes without incident. She was provided with Vaccine Information Sheet and instruction to access the V-Safe system.   Ms. Galen was instructed to call 911 with any severe reactions post vaccine: Marland Kitchen Difficulty breathing  . Swelling of face and throat  . A fast heartbeat  . A bad rash all over body  . Dizziness and weakness   Immunizations Administered    Name Date Dose VIS Date Route   Pfizer COVID-19 Vaccine 05/21/2019 10:38 AM 0.3 mL 03/26/2018 Intramuscular   Manufacturer: [employer], [employer]   Lot: [id number]   [place]: [id number]

## 2019-05-23 ENCOUNTER — Other Ambulatory Visit: Payer: Self-pay | Admitting: Family Medicine

## 2019-05-30 ENCOUNTER — Ambulatory Visit: Payer: 59 | Admitting: Sports Medicine

## 2019-05-30 ENCOUNTER — Encounter: Payer: Self-pay | Admitting: Sports Medicine

## 2019-05-30 ENCOUNTER — Other Ambulatory Visit: Payer: Self-pay

## 2019-05-30 DIAGNOSIS — M7701 Medial epicondylitis, right elbow: Secondary | ICD-10-CM | POA: Diagnosis not present

## 2019-05-30 NOTE — Progress Notes (Signed)
    Procedures performed today:    Procedure: Real-time Ultrasound Guided injection of the right medial epicondyle/common flexor tendon origin Device: Samsung HS60  Verbal informed consent obtained.  Time-out conducted.  Noted no overlying erythema, induration, or other signs of local infection.  Skin prepped in a sterile fashion.  Local anesthesia: Topical Ethyl chloride.  With sterile technique and under real time ultrasound guidance: 1 cc Kenalog 40, 1 cc lidocaine, 1 cc bupivacaine injected easily Completed without difficulty  Pain immediately resolved suggesting accurate placement of the medication.  Advised to call if fevers/chills, erythema, induration, drainage, or persistent bleeding.  Images permanently stored and available for review in the ultrasound unit.  Impression: Technically successful ultrasound guided injection.  Independent interpretation of notes and tests performed by another provider:   None.  Brief History, Exam, Impression, and Recommendations:    Medial epicondylitis, right This is a very pleasant 64 year old female, she was digging up some bushes in her yard, and exacerbated her pre-existing medial epicondylitis, symptoms are worsening, medial epicondyle. Today we did an injection. Last injection was in September 2020. Return to see me as needed.    ___________________________________________ Gwen Her. Dianah Field, M.D., ABFM., CAQSM. Primary Care and [place] Instructor of [employer] [facility]

## 2019-05-30 NOTE — Assessment & Plan Note (Signed)
This is a very pleasant 64 year old female, she was digging up some bushes in her yard, and exacerbated her pre-existing medial epicondylitis, symptoms are worsening, medial epicondyle. Today we did an injection. Last injection was in September 2020. Return to see me as needed.

## 2019-06-06 ENCOUNTER — Other Ambulatory Visit: Payer: Self-pay | Admitting: Family Medicine

## 2019-06-20 ENCOUNTER — Other Ambulatory Visit: Payer: Self-pay

## 2019-06-20 ENCOUNTER — Ambulatory Visit: Payer: 59 | Admitting: Family Medicine

## 2019-06-20 ENCOUNTER — Encounter: Payer: Self-pay | Admitting: Family Medicine

## 2019-06-20 VITALS — BP 136/91 | HR 64 | Ht 64.0 in | Wt 248.0 lb

## 2019-06-20 DIAGNOSIS — Z6841 Body Mass Index (BMI) 40.0 and over, adult: Secondary | ICD-10-CM

## 2019-06-20 DIAGNOSIS — R635 Abnormal weight gain: Secondary | ICD-10-CM

## 2019-06-20 DIAGNOSIS — Z636 Dependent relative needing care at home: Secondary | ICD-10-CM | POA: Diagnosis not present

## 2019-06-20 DIAGNOSIS — M545 Low back pain, unspecified: Secondary | ICD-10-CM

## 2019-06-20 MED ORDER — GABAPENTIN 300 MG PO CAPS: 600.0000 mg | ORAL_CAPSULE | Freq: Every day | ORAL | 2 refills | Status: DC

## 2019-06-20 MED ORDER — DULOXETINE HCL 60 MG PO CPEP: 60.0000 mg | ORAL_CAPSULE | Freq: Every day | ORAL | 1 refills | Status: DC

## 2019-06-20 NOTE — Assessment & Plan Note (Signed)
Discussed goal setting as above.

## 2019-06-20 NOTE — Progress Notes (Signed)
Established Patient Office Visit  Subjective:  Patient ID: Patricia Waters, female    DOB: 1955-08-04  Age: 64 y.o. MRN: [id number]  CC:  Chief Complaint  Patient presents with  . Weight Check    HPI Patricia Waters presents for abnormal weight gain.  She is doing well overall.  She has not gained any weight but she also has not lost any weight.  She says that she tends to be low bit more active in the summer she has been trying to walk a little bit recently.  She has been having a little bit more musculoskeletal pain so would like to try increasing her Cymbalta which she uses for her back.  Past Medical History:  Diagnosis Date  . Breast cancer ([place])    at age 55  . Sleep apnea   . Vertigo     Past Surgical History:  Procedure Laterality Date  . BREAST LUMPECTOMY  1994   right  . CERVICAL FUSION  1995   C5-C7  . MASTECTOMY  2005   Bilat     Family History  Problem Relation Age of Onset  . Stroke Mother   . Hyperlipidemia Mother   . Skin cancer Mother   . Breast cancer Mother   . Diabetes Father   . Hyperlipidemia Father   . Heart attack Father 10  . Colon cancer Father   . Cancer Father 72       prostate  . Diabetes Paternal Grandmother   . Heart attack Paternal Grandmother   . Rheum arthritis Paternal Grandmother   . Hyperlipidemia Sister   . Heart attack Paternal Grandfather   . Heart attack Maternal Grandfather   . Heart attack Maternal Grandmother   . Skin cancer Maternal Grandmother   . Rheum arthritis Maternal Grandmother   . Breast cancer Sister   . Lymphoma Maternal Aunt   . Kidney cancer Maternal Aunt     Social History   Socioeconomic History  . Marital status: Married    Spouse name: [name]  . Number of children: Not on file  . Years of education: Not on file  . Highest education level: Not on file  Occupational History  . Occupation: Radio broadcast assistant: [employer]    Comment: [employer]  Tobacco Use  . Smoking status: Former  Smoker    Types: Cigarettes    Quit date: 10/31/2006    Years since quitting: 12.6  . Smokeless tobacco: Never Used  Substance and Sexual Activity  . Alcohol use: No  . Drug use: No  . Sexual activity: Not Currently    Partners: Female    Birth control/protection: None  Other Topics Concern  . Not on file  Social History Narrative   Lives with her partner, [name]. Former Therapist, nutritional.    Social Determinants of Health   Financial Resource Strain:   . Difficulty of Paying Living Expenses:   Food Insecurity:   . Worried About Charity fundraiser in the Last Year:   . Arboriculturist in the Last Year:   Transportation Needs:   . Film/video editor (Medical):   Marland Kitchen Lack of Transportation (Non-Medical):   Physical Activity:   . Days of Exercise per Week:   . Minutes of Exercise per Session:   Stress:   . Feeling of Stress :   Social Connections:   . Frequency of Communication with Friends and Family:   . Frequency of Social Gatherings with  Friends and Family:   . Attends Religious Services:   . Active Member of Clubs or Organizations:   . Attends Archivist Meetings:   Marland Kitchen Marital Status:   Intimate Partner Violence:   . Fear of Current or Ex-Partner:   . Emotionally Abused:   Marland Kitchen Physically Abused:   . Sexually Abused:     Outpatient Medications Prior to Visit  Medication Sig Dispense Refill  . calcium carbonate (OS-CAL) 600 MG TABS Take 600 mg by mouth daily.      . cholecalciferol (VITAMIN D) 1000 UNITS tablet Take 1,000 Units by mouth daily.      . fluticasone (FLONASE) 50 MCG/ACT nasal spray Place into both nostrils daily.    . Insulin Pen Needle (NOVOFINE) 32G X 6 MM MISC 1 EACH BY DOES NOT APPLY ROUTE DAILY. USE TO INJECT SAXENDA, ONCE DAILY. 100 each 1  . Krill Oil 1000 MG CAPS Take by mouth.    . Multiple Vitamins-Minerals (ICAPS AREDS FORMULA PO) Take 2 capsules by mouth daily.    . Multiple Vitamins-Minerals (VITEYES AREDS FORMULA PO) Take by mouth  daily.    Marland Kitchen rOPINIRole (REQUIP) 0.5 MG tablet Take 1 tablet (0.5 mg total) by mouth at bedtime. 90 tablet 3  . SAXENDA 18 MG/3ML SOPN INJECT 3 MG INTO THE SKIN DAILY. 15 mL 1  . vitamin C (ASCORBIC ACID) 500 MG tablet Take 500 mg by mouth daily.      . DULoxetine (CYMBALTA) 30 MG capsule TAKE 1 CAPSULE BY MOUTH  DAILY 90 capsule 2  . gabapentin (NEURONTIN) 300 MG capsule TAKE 2 CAPSULES BY MOUTH  DAILY 180 capsule 2   No facility-administered medications prior to visit.    No Known Allergies  ROS Review of Systems    Objective:    Physical Exam  Constitutional: She is oriented to person, place, and time. She appears well-developed and well-nourished.  HENT:  Head: Normocephalic and atraumatic.  Cardiovascular: Normal rate, regular rhythm and normal heart sounds.  Pulmonary/Chest: Effort normal and breath sounds normal.  Neurological: She is alert and oriented to person, place, and time.  Skin: Skin is warm and dry.  Psychiatric: She has a normal mood and affect. Her behavior is normal.    BP (!) 136/91   Pulse 64   Ht '5\' 4"'  (1.626 m)   Wt 248 lb (112.5 kg)   SpO2 100%   BMI 42.57 kg/m  Wt Readings from Last 3 Encounters:  06/20/19 248 lb (112.5 kg)  03/24/19 248 lb (112.5 kg)  01/20/19 243 lb (110.2 kg)     There are no preventive care reminders to display for this patient.  There are no preventive care reminders to display for this patient.  Lab Results  Component Value Date   TSH 2.88 09/07/2015   Lab Results  Component Value Date   WBC 9.8 09/07/2015   HGB 12.3 09/07/2015   HCT 37.2 09/07/2015   MCV 92.8 09/07/2015   PLT 310 09/07/2015   Lab Results  Component Value Date   NA 138 03/24/2019   K 4.6 03/24/2019   CHLORIDE 103 01/20/2014   CO2 30 03/24/2019   GLUCOSE 95 03/24/2019   BUN 9 03/24/2019   CREATININE 0.78 03/24/2019   BILITOT 0.4 03/24/2019   ALKPHOS 111 11/25/2015   AST 22 03/24/2019   ALT 21 03/24/2019   PROT 7.1 03/24/2019    ALBUMIN 4.1 11/25/2015   CALCIUM 9.7 03/24/2019   ANIONGAP 10 01/20/2014  EGFR 77 (L) 01/20/2014   Lab Results  Component Value Date   CHOL 204 (H) 03/24/2019   Lab Results  Component Value Date   HDL 61 03/24/2019   Lab Results  Component Value Date   LDLCALC 111 (H) 03/24/2019   Lab Results  Component Value Date   TRIG 197 (H) 03/24/2019   Lab Results  Component Value Date   CHOLHDL 3.3 03/24/2019   Lab Results  Component Value Date   HGBA1C 5.4 03/24/2019      Assessment & Plan:   Problem List Items Addressed This Visit      Other   Low back pain    We will try increasing Cymbalta to 60 mg.  She can let us know if she has any concerns or side effects that she is tolerating well we will continue with that regimen.      Caregiver stress    Feels like she is doing okay right now.  She feels like she has a good support system with her pastor.      BMI 40.0-44.9, adult [facility])    Discussed goal setting as above.      Abnormal weight gain - Primary    Is stable.  I am happy that she has not gained but again we discussed her allergies around really working on activity level and setting some goals to be active every single day in some shape or form above and beyond typical housework duties.         Meds ordered this encounter  Medications  . gabapentin (NEURONTIN) 300 MG capsule    Sig: Take 2 capsules (600 mg total) by mouth daily.    Dispense:  180 capsule    Refill:  2  . DULoxetine (CYMBALTA) 60 MG capsule    Sig: Take 1 capsule (60 mg total) by mouth daily.    Dispense:  90 capsule    Refill:  1    Follow-up: Return in about 3 months (around 09/20/2019) for weight and IFG.    Beatrice Lecher, MD

## 2019-06-20 NOTE — Assessment & Plan Note (Signed)
Is stable.  I am happy that she has not gained but again we discussed her allergies around really working on activity level and setting some goals to be active every single day in some shape or form above and beyond typical housework duties.

## 2019-06-20 NOTE — Assessment & Plan Note (Signed)
Feels like she is doing okay right now.  She feels like she has a good support system with her pastor.

## 2019-06-20 NOTE — Assessment & Plan Note (Signed)
We will try increasing Cymbalta to 60 mg.  She can let us know if she has any concerns or side effects that she is tolerating well we will continue with that regimen.

## 2019-07-04 ENCOUNTER — Other Ambulatory Visit: Payer: Self-pay

## 2019-07-04 ENCOUNTER — Ambulatory Visit (INDEPENDENT_AMBULATORY_CARE_PROVIDER_SITE_OTHER): Payer: 59 | Admitting: Family Medicine

## 2019-07-04 VITALS — BP 132/58 | HR 71 | Ht 64.0 in | Wt 248.0 lb

## 2019-07-04 DIAGNOSIS — R030 Elevated blood-pressure reading, without diagnosis of hypertension: Secondary | ICD-10-CM

## 2019-07-04 NOTE — Progress Notes (Signed)
Agree with documentation as above.   Annaleigha Woo, MD  

## 2019-07-04 NOTE — Progress Notes (Signed)
   Subjective:    Patient ID: Patricia Waters, female    DOB: [birth year], 64 y.o.   MRN: [id number]  HPI Patient is here for blood pressure check. Elevated blood pressure at last visit at 136/91.    Review of Systems     Objective:   Physical Exam        Assessment & Plan:  Patient has not been checking blood pressures, admits to feeling very stressed at last visit. Initial blood pressure was 136/60, pulse 72. I had patient sit in room for 10 minutes to see if systolic would come down. On re-check, blood pressure did come down to 132/58 pulse 71.   Patient is also on Saxenda, so I went ahead and addressed the weight check also. Denies any complications with Saxenda. Patient has maintained weight of 248 lbs. Patient does report she is walking outside every other day and trying to increase exercise. Does not need any refills on Saxenda at this time.  HM: up to date

## 2019-07-19 ENCOUNTER — Other Ambulatory Visit: Payer: Self-pay | Admitting: Family Medicine

## 2019-07-24 ENCOUNTER — Ambulatory Visit: Payer: 59 | Admitting: Obstetrics & Gynecology

## 2019-07-28 ENCOUNTER — Other Ambulatory Visit: Payer: Self-pay

## 2019-07-28 ENCOUNTER — Encounter: Payer: Self-pay | Admitting: Obstetrics & Gynecology

## 2019-07-28 ENCOUNTER — Ambulatory Visit: Payer: 59 | Admitting: Obstetrics & Gynecology

## 2019-07-28 ENCOUNTER — Other Ambulatory Visit: Payer: Self-pay | Admitting: Obstetrics & Gynecology

## 2019-07-28 VITALS — BP 136/79 | HR 73 | Ht 66.0 in | Wt 253.0 lb

## 2019-07-28 DIAGNOSIS — C541 Malignant neoplasm of endometrium: Secondary | ICD-10-CM

## 2019-07-28 DIAGNOSIS — N950 Postmenopausal bleeding: Secondary | ICD-10-CM | POA: Diagnosis not present

## 2019-07-28 NOTE — Progress Notes (Signed)
   Subjective:    Patient ID: Patricia Waters, female    DOB: 07-26-1955, 64 y.o.   MRN: [id number]  HPI  64 yo female had 3 days of bleeding earlier in June.  None today.  No pain.  No intercourse.  No estrogen or hormonal medications.  Review of Systems  Constitutional: Negative.   Respiratory: Negative.   Cardiovascular: Negative.   Gastrointestinal: Negative.   Genitourinary: Negative.        Objective:   Physical Exam Vitals reviewed.  Constitutional:      General: She is not in acute distress.    Appearance: She is well-developed.  HENT:     Head: Normocephalic and atraumatic.  Eyes:     Conjunctiva/sclera: Conjunctivae normal.  Cardiovascular:     Rate and Rhythm: Normal rate.  Pulmonary:     Effort: Pulmonary effort is normal.  Abdominal:     Palpations: Abdomen is soft.     Tenderness: There is no abdominal tenderness.  Genitourinary:    Comments: Tanner V Vulva:  No lesion Vagina:  Pink, no lesions, no discharge, no blood Cervix:  No CMT       Skin:    General: Skin is warm and dry.  Neurological:     Mental Status: She is alert and oriented to person, place, and time.    Vitals:   07/28/19 1552  BP: 136/79  Pulse: 73  Weight: 253 lb (114.8 kg)  Height: 5\' 6"  (1.676 m)      Assessment & Plan:  64 yo female with PMB.  Mother with endometrial cancer. Endometrial biopsy today.  Counseling around the possibility of endometrial cancer and what next steps would be if biopsy revealed cancer.    20 minutes spent with patient and documentation.  ENDOMETRIAL BIOPSY     The indications for endometrial biopsy were reviewed.   Risks of the biopsy including cramping, bleeding, infection, uterine perforation, inadequate specimen and need for additional procedures  were discussed. The patient states she understands and agrees to undergo procedure today. Consent was signed. Time out was performed. Urine HCG was negative. A sterile speculum was placed in the  patient's vagina and the cervix was prepped with Betadine. A single-toothed tenaculum was placed on the anterior lip of the cervix to stabilize it. The 3 mm pipelle was introduced into the endometrial cavity without difficulty to a depth of 8 cm, and a moderate amount of tissue was obtained and sent to pathology. The instruments were removed from the patient's vagina. Minimal bleeding from the cervix was noted. The patient tolerated the procedure well. Routine post-procedure instructions were given to the patient. The patient will follow up to review the results and for further management.

## 2019-07-30 ENCOUNTER — Other Ambulatory Visit: Payer: Self-pay | Admitting: *Deleted

## 2019-07-30 ENCOUNTER — Ambulatory Visit (INDEPENDENT_AMBULATORY_CARE_PROVIDER_SITE_OTHER): Payer: 59 | Admitting: *Deleted

## 2019-07-30 ENCOUNTER — Other Ambulatory Visit: Payer: Self-pay

## 2019-07-30 DIAGNOSIS — Z01812 Encounter for preprocedural laboratory examination: Secondary | ICD-10-CM

## 2019-07-30 DIAGNOSIS — C541 Malignant neoplasm of endometrium: Secondary | ICD-10-CM

## 2019-07-30 NOTE — Progress Notes (Signed)
Ambulatory referral entered for CT of Abd/Pelvis with contrast and to Dr Roanna Raider Onc.  Pt is aware of dx per Dr Gala Romney.

## 2019-07-30 NOTE — Progress Notes (Signed)
Pt here for BUN/Creat lab only

## 2019-08-01 ENCOUNTER — Telehealth: Payer: Self-pay | Admitting: *Deleted

## 2019-08-01 ENCOUNTER — Ambulatory Visit (INDEPENDENT_AMBULATORY_CARE_PROVIDER_SITE_OTHER): Payer: 59

## 2019-08-01 ENCOUNTER — Other Ambulatory Visit: Payer: Self-pay

## 2019-08-01 DIAGNOSIS — C541 Malignant neoplasm of endometrium: Secondary | ICD-10-CM | POA: Diagnosis not present

## 2019-08-01 LAB — BUN+CREAT
BUN/Creatinine Ratio: 12 (ref 12–28)
BUN: 11 mg/dL (ref 8–27)
Creatinine, Ser: 0.9 mg/dL (ref 0.57–1.00)
GFR calc Af Amer: 79 mL/min/{1.73_m2} (ref >59)
GFR calc non Af Amer: 68 mL/min/{1.73_m2} (ref >59)

## 2019-08-01 LAB — SPECIMEN STATUS REPORT

## 2019-08-01 MED ORDER — IOHEXOL 300 MG/ML  SOLN
100.0000 mL | Freq: Once | INTRAMUSCULAR | Status: AC | PRN
  Administered 2019-08-01: 100 mL via INTRAVENOUS

## 2019-08-01 NOTE — Telephone Encounter (Signed)
Called the patient and scheduled an appt for 7/6. Gave the policy for visitors and masks

## 2019-08-05 ENCOUNTER — Telehealth: Payer: Self-pay | Admitting: Gynecologic Oncology

## 2019-08-05 ENCOUNTER — Inpatient Hospital Stay: Payer: 59 | Attending: Gynecologic Oncology | Admitting: Gynecologic Oncology

## 2019-08-05 ENCOUNTER — Other Ambulatory Visit: Payer: Self-pay | Admitting: Gynecologic Oncology

## 2019-08-05 ENCOUNTER — Encounter: Payer: Self-pay | Admitting: Gynecologic Oncology

## 2019-08-05 ENCOUNTER — Encounter (HOSPITAL_COMMUNITY): Payer: Self-pay | Admitting: Gynecologic Oncology

## 2019-08-05 ENCOUNTER — Other Ambulatory Visit: Payer: Self-pay

## 2019-08-05 VITALS — BP 129/72 | HR 64 | Temp 97.1°F | Resp 17 | Ht 66.0 in | Wt 254.8 lb

## 2019-08-05 DIAGNOSIS — C541 Malignant neoplasm of endometrium: Secondary | ICD-10-CM | POA: Diagnosis not present

## 2019-08-05 DIAGNOSIS — Z79899 Other long term (current) drug therapy: Secondary | ICD-10-CM | POA: Insufficient documentation

## 2019-08-05 DIAGNOSIS — Z6841 Body Mass Index (BMI) 40.0 and over, adult: Secondary | ICD-10-CM | POA: Insufficient documentation

## 2019-08-05 DIAGNOSIS — G4730 Sleep apnea, unspecified: Secondary | ICD-10-CM | POA: Insufficient documentation

## 2019-08-05 DIAGNOSIS — Z87891 Personal history of nicotine dependence: Secondary | ICD-10-CM | POA: Insufficient documentation

## 2019-08-05 MED ORDER — SENNOSIDES-DOCUSATE SODIUM 8.6-50 MG PO TABS: 2.0000 | ORAL_TABLET | Freq: Every day | ORAL | 0 refills | Status: DC

## 2019-08-05 MED ORDER — IBUPROFEN 600 MG PO TABS: 600.0000 mg | ORAL_TABLET | Freq: Four times a day (QID) | ORAL | 0 refills | Status: DC | PRN

## 2019-08-05 MED ORDER — OXYCODONE HCL 5 MG PO TABS: 5.0000 mg | ORAL_TABLET | ORAL | 0 refills | Status: DC | PRN

## 2019-08-05 NOTE — Progress Notes (Signed)
Consult Note: Gyn-Onc  Consult was requested by Dr. Gala Romney for the evaluation of ADALIN VANDERPLOEG 64 y.o. female  CC:  Chief Complaint  Patient presents with   Endometrial cancer [facility])    New Patient    Assessment/Plan:  Ms. LOTOYA CASELLA  is a 64 y.o.  year old with FIGO grade 1 endometrial cancer in the setting of obesity, BMI 41kg/m2.  A detailed discussion was held with the patient and her family with regard to to her endometrial cancer diagnosis. We discussed the standard management options for uterine cancer which includes surgery followed possibly by adjuvant therapy depending on the results of surgery. The options for surgical management include a hysterectomy and removal of the tubes and ovaries possibly with removal of pelvic and para-aortic lymph nodes.If feasible, a minimally invasive approach including a robotic hysterectomy or laparoscopic hysterectomy have benefits including shorter hospital stay, recovery time and better wound healing than with open surgery. The patient has been counseled about these surgical options and the risks of surgery in general including infection, bleeding, damage to surrounding structures (including bowel, bladder, ureters, nerves or vessels), and the postoperative risks of PE/ DVT, and lymphedema. I extensively reviewed the additional risks of robotic hysterectomy including possible need for conversion to open laparotomy.  I discussed positioning during surgery of trendelenberg and risks of minor facial swelling and care we take in preoperative positioning.  After counseling and consideration of her options, she desires to proceed with robotic assisted total hysterectomy with bilateral sapingo-oophorectomy and SLN biopsy.   She will be seen by anesthesia for preoperative clearance and discussion of postoperative pain management.  She was given the opportunity to ask questions, which were answered to her satisfaction, and she is agreement with the above  mentioned plan of care.   HPI: Darrien Laakso is a 64 year old P0 who was seen in consultation at the request of Dr Gala Romney for evaluation of grade 1 endometrial cancer.  The patient is a regular patient of Dr Roseanne Kaufman for gynecological care she experienced an episode of postmenopausal bleeding on July 06, 2019 and promptly notified her gynecologist who made an appointment to see the patient.  An endometrial biopsy was performed on July 28, 2019 which revealed FIGO grade 1 endometrioid adenocarcinoma.  A CT abdomen and pelvis was subsequently performed on August 01, 2019 and showed no evidence of abdominal pelvic metastatic disease.  A small fibroid was seen in the anterior uterine corpus measuring 1.7 cm but the uterus was otherwise unremarkable.  The patient's medical history significant for morbid obesity with a BMI of 41 kg meters squared.  She is G0.  She has no history of abdominal surgery.  She has history of breast cancer, bilateral, estrogen receptor positive.  She did not receive tamoxifen therapy for this.  She received treatment with Dr. Gunnar Bulla Magrinat.  The patient has a strong medical history for malignancy including a mother who had endometrial cancer at age 71 and separate breast cancer diagnosis at age 68.  She has a sister with a history of breast cancer and herself had a history of breast cancer.  Her mother was screened as was her sister and the patient herself with genetic testing at the current health [facility] in approximately 2017.  This genetic testing was negative for identifiable deleterious mutations.  The patient is in a same-sex relationship and married to her wife.  Her wife is a retired Therapist, sports.  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  Current Meds:  Outpatient Encounter Medications as of 08/05/2019  Medication Sig   calcium carbonate (OS-CAL) 600 MG TABS Take 600 mg by mouth daily.     cholecalciferol (VITAMIN D) 1000 UNITS tablet Take 1,000 Units by mouth daily.      DULoxetine (CYMBALTA) 60 MG capsule Take 1 capsule (60 mg total) by mouth daily.   gabapentin (NEURONTIN) 300 MG capsule Take 2 capsules (600 mg total) by mouth daily.   Insulin Pen Needle (NOVOFINE) 32G X 6 MM MISC 1 EACH BY DOES NOT APPLY ROUTE DAILY. USE TO INJECT SAXENDA, ONCE DAILY.   Krill Oil 1000 MG CAPS Take by mouth.   Multiple Vitamins-Minerals (ICAPS AREDS FORMULA PO) Take 2 capsules by mouth daily.   Multiple Vitamins-Minerals (VITEYES AREDS FORMULA PO) Take by mouth daily.   rOPINIRole (REQUIP) 0.5 MG tablet Take 1 tablet (0.5 mg total) by mouth at bedtime.   SAXENDA 18 MG/3ML SOPN INJECT 3 MG INTO THE SKIN DAILY.   vitamin C (ASCORBIC ACID) 500 MG tablet Take 500 mg by mouth daily.     [DISCONTINUED] fluticasone (FLONASE) 50 MCG/ACT nasal spray Place into both nostrils daily. (Patient not taking: Reported on 08/05/2019)   No facility-administered encounter medications on file as of 08/05/2019.    Allergy: No Known Allergies  Social Hx:   Social History   Socioeconomic History   Marital status: Married    Spouse name: Chief Financial Officer   Number of children: Not on file   Years of education: Not on file   Highest education level: Not on file  Occupational History   Occupation: Radio broadcast assistant: [employer]    Comment: [employer]  Tobacco Use   Smoking status: Former Smoker    Types: Cigarettes    Quit date: 10/31/2006    Years since quitting: 12.7   Smokeless tobacco: Never Used  Scientific laboratory technician Use: Never used  Substance and Sexual Activity   Alcohol use: No   Drug use: No   Sexual activity: Not Currently    Partners: Female    Birth control/protection: None  Other Topics Concern   Not on file  Social History Narrative   Lives with her partner, [name]. Former Therapist, nutritional.    Social Determinants of Health   Financial Resource Strain:    Difficulty of Paying Living Expenses:   Food Insecurity:    Worried About Charity fundraiser  in the Last Year:    Arboriculturist in the Last Year:   Transportation Needs:    Film/video editor (Medical):    Lack of Transportation (Non-Medical):   Physical Activity:    Days of Exercise per Week:    Minutes of Exercise per Session:   Stress:    Feeling of Stress :   Social Connections:    Frequency of Communication with Friends and Family:    Frequency of Social Gatherings with Friends and Family:    Attends Religious Services:    Active Member of Clubs or Organizations:    Attends Music therapist:    Marital Status:   Intimate Partner Violence:    Fear of Current or Ex-Partner:    Emotionally Abused:    Physically Abused:    Sexually Abused:     Past Surgical Hx:  Past Surgical History:  Procedure Laterality Date   BREAST LUMPECTOMY  1994   right   CERVICAL FUSION  1995   C5-C7   MASTECTOMY  2005   Bilat     Past Medical Hx:  Past Medical History:  Diagnosis Date   Breast cancer ([place])    at age 53   Sleep apnea    Vertigo     Past Gynecological History:  See HPI No LMP recorded. Patient is postmenopausal.  Family Hx:  Family History  Problem Relation Age of Onset   Stroke Mother    Hyperlipidemia Mother    Skin cancer Mother    Breast cancer Mother    Endometrial cancer Mother    Diabetes Father    Hyperlipidemia Father    Heart attack Father 80   Colon cancer Father    Cancer Father 33       prostate   Diabetes Paternal Grandmother    Heart attack Paternal Grandmother    Rheum arthritis Paternal Grandmother    Hyperlipidemia Sister    Heart attack Paternal Grandfather    Heart attack Maternal Grandfather    Heart attack Maternal Grandmother    Skin cancer Maternal Grandmother    Rheum arthritis Maternal Grandmother    Breast cancer Sister    Lymphoma Maternal Aunt    Kidney cancer Maternal Aunt     Review of Systems:  Constitutional  Feels well,   ENT Normal appearing  ears and nares bilaterally Skin/Breast  No rash, sores, jaundice, itching, dryness Cardiovascular  No chest pain, shortness of breath, or edema  Pulmonary  No cough or wheeze.  Gastro Intestinal  No nausea, vomitting, or diarrhoea. No bright red blood per rectum, no abdominal pain, change in bowel movement, or constipation.  Genito Urinary  No frequency, urgency, dysuria, + postmenopausal bleeding Musculo Skeletal  No myalgia, arthralgia, joint swelling or pain  Neurologic  No weakness, numbness, change in gait,  Psychology  No depression, anxiety, insomnia.   Vitals:  Blood pressure 129/72, pulse 64, temperature (!) 97.1 F (36.2 C), temperature source Tympanic, resp. rate 17, height 5\' 6"  (1.676 m), weight 254 lb 12.8 oz (115.6 kg), SpO2 98 %.  Physical Exam: WD in NAD Neck  Supple NROM, without any enlargements.  Lymph Node Survey No cervical supraclavicular or inguinal adenopathy Cardiovascular  Pulse normal rate, regularity and rhythm. S1 and S2 normal.  Lungs  Clear to auscultation bilateraly, without wheezes/crackles/rhonchi. Good air movement.  Skin  No rash/lesions/breakdown  Psychiatry  Alert and oriented to person, place, and time  Abdomen  Normoactive bowel sounds, abdomen soft, non-tender and obese without evidence of hernia.  Back No CVA tenderness Genito Urinary  Vulva/vagina: Normal external female genitalia.   No lesions. No discharge or bleeding.  Bladder/urethra:  No lesions or masses, well supported bladder  Vagina: narrow  Cervix: Normal appearing, no lesions.  Uterus:  Small, mobile, no parametrial involvement or nodularity.  Adnexa: no palpable masses. Rectal  deferred Extremities  No bilateral cyanosis, clubbing or edema.   Thereasa Solo, MD  08/05/2019, 10:47 AM

## 2019-08-05 NOTE — Progress Notes (Signed)
DUE TO COVID-19 ONLY ONE VISITOR IS ALLOWED TO COME WITH YOU AND STAY IN THE WAITING ROOM ONLY DURING PRE OP AND PROCEDURE DAY OF SURGERY. THE 1 VISITOR MAY VISIT WITH YOU AFTER SURGERY IN YOUR PRIVATE ROOM DURING VISITING HOURS ONLY!  YOU NEED TO HAVE A COVID 19 TEST ON__7/12/2019 _____ @_______ , THIS TEST MUST BE DONE BEFORE SURGERY, COME  [place] , [id number].  ([place]) ONCE YOUR COVID TEST IS COMPLETED, PLEASE BEGIN THE QUARANTINE INSTRUCTIONS AS OUTLINED IN YOUR HANDOUT.                Patricia Waters  08/05/2019   Your procedure is scheduled on:    Repo7/15/2021 rt to [facility] Main  Entrance   Report to admitting at   0740am     Call this number if you have problems the morning of surgery [phone]    Remember: Do not eat food  drink l :After Midnight. BRUSH YOUR TEETH MORNING OF SURGERY AND RINSE YOUR MOUTH OUT, NO CHEWING GUM CANDY OR MINTS. Eat a light diet the day before surgery .  Avoid gas producing foods.  May have clear liquids from 12 midnite until 0710am of surgery then nothing by mouth.       Take these medicines the morning of surgery with A SIP OF WATER: Cymbalta   DO NOT TAKE ANY DIABETIC MEDICATIONS DAY OF YOUR SURGERY                               You may not have any metal on your body including hair pins and              piercings  Do not wear jewelry, make-up, lotions, powders or perfumes, deodorant             Do not wear nail polish on your fingernails.  Do not shave  48 hours prior to surgery.     Do not bring valuad neckbles to the hospital. [place].  Contacts, dentures or bridgework may not be worn into surgery.  Leave suitcase in the car. After surgery it may be brought to your room.     Patients discharged the day of surgery will not be allowed to drive home. IF YOU ARE HAVING SURGERY AND GOING HOME THE SAME DAY, YOU MUST HAVE AN ADULT TO DRIVE  YOU HOME AND BE WITH YOU FOR 24 HOURS. YOU MAY GO HOME BY TAXI OR UBER OR ORTHERWISE, BUT AN ADULT MUST ACCOMPANY YOU HOME AND STAY WITH YOU FOR 24 HOURS.  Name and phone number of your driver:               Please read over the following fact sheets you were given: _____________________________________________________________________                CLEAR LIQUID DIET   Foods Allowed                                                                     Foods Excluded  Coffee and tea, regular and decaf                             liquids that you cannot  Plain Jell-O any favor except red or purple                                           see through such as: Fruit ices (not with fruit pulp)                                     milk, soups, orange juice  Iced Popsicles                                    All solid food Carbonated beverages, regular and diet                                    Cranberry, grape and apple juices Sports drinks like Gatorade Lightly seasoned clear broth or consume(fat free) Sugar, honey syrup  Sample Menu Breakfast                                Lunch                                     Supper Cranberry juice                    Beef broth                            Chicken broth Jell-O                                     Grape juice                           Apple juice Coffee or tea                        Jell-O                                      Popsicle                                                Coffee or tea                        Coffee or tea  _____________________________________________________________________  [facility] - Preparing for Surgery Before surgery, you can play an important role.  Because skin is not sterile, your skin needs to be  as free of germs as possible.  You can reduce the number of germs on your skin by washing with CHG (chlorahexidine gluconate) soap before surgery.  CHG is an antiseptic cleaner which kills germs and  bonds with the skin to continue killing germs even after washing. Please DO NOT use if you have an allergy to CHG or antibacterial soaps.  If your skin becomes reddened/irritated stop using the CHG and inform your nurse when you arrive at Short Stay. Do not shave (including legs and underarms) for at least 48 hours prior to the first CHG shower.  You may shave your face/neck. Please follow these instructions carefully:  1.  Shower with CHG Soap the night before surgery and the  morning of Surgery.  2.  If you choose to wash your hair, wash your hair first as usual with your  normal  shampoo.  3.  After you shampoo, rinse your hair and body thoroughly to remove the  shampoo.                           4.  Use CHG as you would any other liquid soap.  You can apply chg directly  to the skin and wash                       Gently with a scrungie or clean washcloth.  5.  Apply the CHG Soap to your body ONLY FROM THE NECK DOWN.   Do not use on face/ open                           Wound or open sores. Avoid contact with eyes, ears mouth and genitals (private parts).                       Wash face,  Genitals (private parts) with your normal soap.             6.  Wash thoroughly, paying special attention to the area where your surgery  will be performed.  7.  Thoroughly rinse your body with warm water from the neck down.  8.  DO NOT shower/wash with your normal soap after using and rinsing off  the CHG Soap.                9.  Pat yourself dry with a clean towel.            10.  Wear clean pajamas.            11.  Place clean sheets on your bed the night of your first shower and do not  sleep with pets. Day of Surgery : Do not apply any lotions/deodorants the morning of surgery.  Please wear clean clothes to the hospital/surgery center.  FAILURE TO FOLLOW THESE INSTRUCTIONS MAY RESULT IN THE CANCELLATION OF YOUR SURGERY PATIENT SIGNATURE_________________________________  NURSE  SIGNATURE__________________________________  ________________________________________________________________________  WHAT IS A BLOOD TRANSFUSION? Blood Transfusion Information  A transfusion is the replacement of blood or some of its parts. Blood is made up of multiple cells which provide different functions.  Red blood cells carry oxygen and are used for blood loss replacement.  White blood cells fight against infection.  Platelets control bleeding.  Plasma helps clot blood.  Other blood products are available for specialized needs, such as hemophilia or other clotting disorders. BEFORE THE TRANSFUSION  Who gives blood for transfusions?   Healthy volunteers who are fully evaluated to make sure their blood is safe. This is blood bank blood. Transfusion therapy is the safest it has ever been in the practice of medicine. Before blood is taken from a donor, a complete history is taken to make sure that person has no history of diseases nor engages in risky social behavior (examples are intravenous drug use or sexual activity with multiple partners). The donor's travel history is screened to minimize risk of transmitting infections, such as malaria. The donated blood is tested for signs of infectious diseases, such as HIV and hepatitis. The blood is then tested to be sure it is compatible with you in order to minimize the chance of a transfusion reaction. If you or a relative donates blood, this is often done in anticipation of surgery and is not appropriate for emergency situations. It takes many days to process the donated blood. RISKS AND COMPLICATIONS Although transfusion therapy is very safe and saves many lives, the main dangers of transfusion include:   Getting an infectious disease.  Developing a transfusion reaction. This is an allergic reaction to something in the blood you were given. Every precaution is taken to prevent this. The decision to have a blood transfusion has been  considered carefully by your caregiver before blood is given. Blood is not given unless the benefits outweigh the risks. AFTER THE TRANSFUSION  Right after receiving a blood transfusion, you will usually feel much better and more energetic. This is especially true if your red blood cells have gotten low (anemic). The transfusion raises the level of the red blood cells which carry oxygen, and this usually causes an energy increase.  The nurse administering the transfusion will monitor you carefully for complications. HOME CARE INSTRUCTIONS  No special instructions are needed after a transfusion. You may find your energy is better. Speak with your caregiver about any limitations on activity for underlying diseases you may have. SEEK MEDICAL CARE IF:   Your condition is not improving after your transfusion.  You develop redness or irritation at the intravenous (IV) site. SEEK IMMEDIATE MEDICAL CARE IF:  Any of the following symptoms occur over the next 12 hours:  Shaking chills.  You have a temperature by mouth above 102 F (38.9 C), not controlled by medicine.  Chest, back, or muscle pain.  People around you feel you are not acting correctly or are confused.  Shortness of breath or difficulty breathing.  Dizziness and fainting.  You get a rash or develop hives.  You have a decrease in urine output.  Your urine turns a dark color or changes to pink, red, or brown. Any of the following symptoms occur over the next 10 days:  You have a temperature by mouth above 102 F (38.9 C), not controlled by medicine.  Shortness of breath.  Weakness after normal activity.  The white part of the eye turns yellow (jaundice).  You have a decrease in the amount of urine or are urinating less often.  Your urine turns a dark color or changes to pink, red, or brown. Document Released: 01/14/2000 Document Revised: 04/10/2011 Document Reviewed: 09/02/2007 [facility] Patient Information 2014  [place], [place].  _______________________________________________________________________

## 2019-08-05 NOTE — Progress Notes (Signed)
Patient takes Kirke Shaggy which is a weight loss medication.  Instructed pt to call DR [name] office and make sure she is aware she is on this drug and obtain further instructions regarding this drug in relation to preop from [place].  Patient given number to call to [place] of [phone].  Patient voiced understanding.

## 2019-08-05 NOTE — H&P (View-Only) (Signed)
Consult Note: Gyn-Onc  Consult was requested by Dr. Gala Romney for the evaluation of Patricia Waters 64 y.o. female  CC:  Chief Complaint  Patient presents with   Endometrial cancer [facility])    New Patient    Assessment/Plan:  Patricia Waters  is a 64 y.o.  year old with FIGO grade 1 endometrial cancer in the setting of obesity, BMI 41kg/m2.  A detailed discussion was held with the patient and her family with regard to to her endometrial cancer diagnosis. We discussed the standard management options for uterine cancer which includes surgery followed possibly by adjuvant therapy depending on the results of surgery. The options for surgical management include a hysterectomy and removal of the tubes and ovaries possibly with removal of pelvic and para-aortic lymph nodes.If feasible, a minimally invasive approach including a robotic hysterectomy or laparoscopic hysterectomy have benefits including shorter hospital stay, recovery time and better wound healing than with open surgery. The patient has been counseled about these surgical options and the risks of surgery in general including infection, bleeding, damage to surrounding structures (including bowel, bladder, ureters, nerves or vessels), and the postoperative risks of PE/ DVT, and lymphedema. I extensively reviewed the additional risks of robotic hysterectomy including possible need for conversion to open laparotomy.  I discussed positioning during surgery of trendelenberg and risks of minor facial swelling and care we take in preoperative positioning.  After counseling and consideration of her options, she desires to proceed with robotic assisted total hysterectomy with bilateral sapingo-oophorectomy and SLN biopsy.   She will be seen by anesthesia for preoperative clearance and discussion of postoperative pain management.  She was given the opportunity to ask questions, which were answered to her satisfaction, and she is agreement with the above  mentioned plan of care.   HPI: Patricia Waters is a 64 year old P0 who was seen in consultation at the request of Dr Gala Romney for evaluation of grade 1 endometrial cancer.  The patient is a regular patient of Dr Roseanne Kaufman for gynecological care she experienced an episode of postmenopausal bleeding on July 06, 2019 and promptly notified her gynecologist who made an appointment to see the patient.  An endometrial biopsy was performed on July 28, 2019 which revealed FIGO grade 1 endometrioid adenocarcinoma.  A CT abdomen and pelvis was subsequently performed on August 01, 2019 and showed no evidence of abdominal pelvic metastatic disease.  A small fibroid was seen in the anterior uterine corpus measuring 1.7 cm but the uterus was otherwise unremarkable.  The patient's medical history significant for morbid obesity with a BMI of 41 kg meters squared.  She is G0.  She has no history of abdominal surgery.  She has history of breast cancer, bilateral, estrogen receptor positive.  She did not receive tamoxifen therapy for this.  She received treatment with Dr. Gunnar Bulla Magrinat.  The patient has a strong medical history for malignancy including a mother who had endometrial cancer at age 21 and separate breast cancer diagnosis at age 41.  She has a sister with a history of breast cancer and herself had a history of breast cancer.  Her mother was screened as was her sister and the patient herself with genetic testing at the current health [facility] in approximately 2017.  This genetic testing was negative for identifiable deleterious mutations.  The patient is in a same-sex relationship and married to her wife.  Her wife is a retired Therapist, sports.  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  Current Meds:  Outpatient Encounter Medications as of 08/05/2019  Medication Sig   calcium carbonate (OS-CAL) 600 MG TABS Take 600 mg by mouth daily.     cholecalciferol (VITAMIN D) 1000 UNITS tablet Take 1,000 Units by mouth daily.      DULoxetine (CYMBALTA) 60 MG capsule Take 1 capsule (60 mg total) by mouth daily.   gabapentin (NEURONTIN) 300 MG capsule Take 2 capsules (600 mg total) by mouth daily.   Insulin Pen Needle (NOVOFINE) 32G X 6 MM MISC 1 EACH BY DOES NOT APPLY ROUTE DAILY. USE TO INJECT SAXENDA, ONCE DAILY.   Krill Oil 1000 MG CAPS Take by mouth.   Multiple Vitamins-Minerals (ICAPS AREDS FORMULA PO) Take 2 capsules by mouth daily.   Multiple Vitamins-Minerals (VITEYES AREDS FORMULA PO) Take by mouth daily.   rOPINIRole (REQUIP) 0.5 MG tablet Take 1 tablet (0.5 mg total) by mouth at bedtime.   SAXENDA 18 MG/3ML SOPN INJECT 3 MG INTO THE SKIN DAILY.   vitamin C (ASCORBIC ACID) 500 MG tablet Take 500 mg by mouth daily.     [DISCONTINUED] fluticasone (FLONASE) 50 MCG/ACT nasal spray Place into both nostrils daily. (Patient not taking: Reported on 08/05/2019)   No facility-administered encounter medications on file as of 08/05/2019.    Allergy: No Known Allergies  Social Hx:   Social History   Socioeconomic History   Marital status: Married    Spouse name: Chief Financial Officer   Number of children: Not on file   Years of education: Not on file   Highest education level: Not on file  Occupational History   Occupation: Radio broadcast assistant: [employer]    Comment: [employer]  Tobacco Use   Smoking status: Former Smoker    Types: Cigarettes    Quit date: 10/31/2006    Years since quitting: 12.7   Smokeless tobacco: Never Used  Scientific laboratory technician Use: Never used  Substance and Sexual Activity   Alcohol use: No   Drug use: No   Sexual activity: Not Currently    Partners: Female    Birth control/protection: None  Other Topics Concern   Not on file  Social History Narrative   Lives with her partner, [name]. Former Therapist, nutritional.    Social Determinants of Health   Financial Resource Strain:    Difficulty of Paying Living Expenses:   Food Insecurity:    Worried About Charity fundraiser  in the Last Year:    Arboriculturist in the Last Year:   Transportation Needs:    Film/video editor (Medical):    Lack of Transportation (Non-Medical):   Physical Activity:    Days of Exercise per Week:    Minutes of Exercise per Session:   Stress:    Feeling of Stress :   Social Connections:    Frequency of Communication with Friends and Family:    Frequency of Social Gatherings with Friends and Family:    Attends Religious Services:    Active Member of Clubs or Organizations:    Attends Music therapist:    Marital Status:   Intimate Partner Violence:    Fear of Current or Ex-Partner:    Emotionally Abused:    Physically Abused:    Sexually Abused:     Past Surgical Hx:  Past Surgical History:  Procedure Laterality Date   BREAST LUMPECTOMY  1994   right   CERVICAL FUSION  1995   C5-C7   MASTECTOMY  2005   Bilat     Past Medical Hx:  Past Medical History:  Diagnosis Date   Breast cancer ([place])    at age 66   Sleep apnea    Vertigo     Past Gynecological History:  See HPI No LMP recorded. Patient is postmenopausal.  Family Hx:  Family History  Problem Relation Age of Onset   Stroke Mother    Hyperlipidemia Mother    Skin cancer Mother    Breast cancer Mother    Endometrial cancer Mother    Diabetes Father    Hyperlipidemia Father    Heart attack Father 41   Colon cancer Father    Cancer Father 44       prostate   Diabetes Paternal Grandmother    Heart attack Paternal Grandmother    Rheum arthritis Paternal Grandmother    Hyperlipidemia Sister    Heart attack Paternal Grandfather    Heart attack Maternal Grandfather    Heart attack Maternal Grandmother    Skin cancer Maternal Grandmother    Rheum arthritis Maternal Grandmother    Breast cancer Sister    Lymphoma Maternal Aunt    Kidney cancer Maternal Aunt     Review of Systems:  Constitutional  Feels well,   ENT Normal appearing  ears and nares bilaterally Skin/Breast  No rash, sores, jaundice, itching, dryness Cardiovascular  No chest pain, shortness of breath, or edema  Pulmonary  No cough or wheeze.  Gastro Intestinal  No nausea, vomitting, or diarrhoea. No bright red blood per rectum, no abdominal pain, change in bowel movement, or constipation.  Genito Urinary  No frequency, urgency, dysuria, + postmenopausal bleeding Musculo Skeletal  No myalgia, arthralgia, joint swelling or pain  Neurologic  No weakness, numbness, change in gait,  Psychology  No depression, anxiety, insomnia.   Vitals:  Blood pressure 129/72, pulse 64, temperature (!) 97.1 F (36.2 C), temperature source Tympanic, resp. rate 17, height 5\' 6"  (1.676 m), weight 254 lb 12.8 oz (115.6 kg), SpO2 98 %.  Physical Exam: WD in NAD Neck  Supple NROM, without any enlargements.  Lymph Node Survey No cervical supraclavicular or inguinal adenopathy Cardiovascular  Pulse normal rate, regularity and rhythm. S1 and S2 normal.  Lungs  Clear to auscultation bilateraly, without wheezes/crackles/rhonchi. Good air movement.  Skin  No rash/lesions/breakdown  Psychiatry  Alert and oriented to person, place, and time  Abdomen  Normoactive bowel sounds, abdomen soft, non-tender and obese without evidence of hernia.  Back No CVA tenderness Genito Urinary  Vulva/vagina: Normal external female genitalia.   No lesions. No discharge or bleeding.  Bladder/urethra:  No lesions or masses, well supported bladder  Vagina: narrow  Cervix: Normal appearing, no lesions.  Uterus:  Small, mobile, no parametrial involvement or nodularity.  Adnexa: no palpable masses. Rectal  deferred Extremities  No bilateral cyanosis, clubbing or edema.   Thereasa Solo, MD  08/05/2019, 10:47 AM

## 2019-08-05 NOTE — Telephone Encounter (Signed)
Attempted to call patient to offer earlier surgical date of July 13.  Will send mychart message.

## 2019-08-05 NOTE — Patient Instructions (Signed)
Preparing for your Surgery  Plan for surgery on August 14, 2019 with Dr. [name] at [place] will be scheduled for a robotic assisted total laparoscopic hysterectomy (removal of uterus and cervix), bilateral salpingo-oophorectomy (removal of both fallopian tubes and ovaries), sentinel lymph node biopsy, possible lymph node dissection.   Pre-operative Testing -You will receive a phone call from presurgical testing at [facility] to arrange for a pre-operative appointment over the phone, lab appointment, and COVID test. The COVID test normally happens 3 days prior to the surgery and they ask that you self quarantine after the test up until surgery to decrease chance of exposure.  -Bring your insurance card, copy of an advanced directive if applicable, medication list  -At that visit, you will be asked to sign a consent for a possible blood transfusion in case a transfusion becomes necessary during surgery.  The need for a blood transfusion is rare but having consent is a necessary part of your care.     -You should not be taking blood thinners or aspirin at least ten days prior to surgery unless instructed by your surgeon.  -Do not take supplements such as fish oil (omega 3), red yeast rice, turmeric before your surgery. STOP TAKING YOUR KRILL OIL NOW.  Day Before Surgery at [place] will be asked to take in a light diet the day before surgery. You will be advised you can have clear liquids after midnight and up until 3 hours before your surgery.    Eat a light diet the day before surgery.  Examples including soups, broths, toast, yogurt, mashed potatoes.  AVOID GAS PRODUCING FOODS. Things to avoid include carbonated beverages (fizzy beverages), raw fruits and raw vegetables, or beans.   If your bowels are filled with gas, your surgeon will have difficulty visualizing your pelvic organs which increases your surgical risks.  Your role in recovery Your role is to become  active as soon as directed by your doctor, while still giving yourself time to heal.  Rest when you feel tired. You will be asked to do the following in order to speed your recovery:  - Cough and breathe deeply. This helps to clear and expand your lungs and can prevent pneumonia after surgery.  - [place]. Do mild physical activity. Walking or moving your legs help your circulation and body functions return to normal. Do not try to get up or walk alone the first time after surgery.   -If you develop swelling on one leg or the other, pain in the back of your leg, redness/warmth in one of your legs, please call the office or go to the Emergency Room to have a doppler to rule out a blood clot. For shortness of breath, chest pain-seek care in the Emergency Room as soon as possible. - Actively manage your pain. Managing your pain lets you move in comfort. We will ask you to rate your pain on a scale of zero to 10. It is your responsibility to tell your doctor or nurse where and how much you hurt so your pain can be treated.  Special Considerations -If you are diabetic, you may be placed on insulin after surgery to have closer control over your blood sugars to promote healing and recovery.  This does not mean that you will be discharged on insulin.  If applicable, your oral antidiabetics will be resumed when you are tolerating a solid diet.  -Your final pathology results from  surgery should be available around one week after surgery and the results will be relayed to you when available.  -Dr. Lahoma Crocker is the surgeon that assists your GYN Oncologist with surgery.  If you end up staying the night, the next day after your surgery you will either see Dr. [name], Dr. Berline Lopes, or Dr. Lahoma Crocker.  -FMLA forms can be faxed to [phone] and please allow 5-7 business days for completion.  Pain Management After Surgery -You have been prescribed your pain medication and bowel  regimen medications before surgery so that you can have these available when you are discharged from the hospital. The pain medication is for use ONLY AFTER surgery and a new prescription will not be given.   -Make sure that you have Tylenol and Ibuprofen at home to use on a regular basis after surgery for pain control. We recommend alternating the medications every hour to six hours since they work differently and are processed in the body differently for pain relief.  -Review the attached handout on narcotic use and their risks and side effects.   Bowel Regimen -You have been prescribed Sennakot-S to take nightly to prevent constipation especially if you are taking the narcotic pain medication intermittently.  It is important to prevent constipation and drink adequate amounts of liquids. You can stop taking this medication when you are not taking pain medication and you are back on your normal bowel routine.  Risks of Surgery Risks of surgery are low but include bleeding, infection, damage to surrounding structures, re-operation, blood clots, and very rarely death.   Blood Transfusion Information (For the consent to be signed before surgery)  We will be checking your blood type before surgery so in case of emergencies, we will know what type of blood you would need.                                            WHAT IS A BLOOD TRANSFUSION?  A transfusion is the replacement of blood or some of its parts. Blood is made up of multiple cells which provide different functions.  Red blood cells carry oxygen and are used for blood loss replacement.  White blood cells fight against infection.  Platelets control bleeding.  Plasma helps clot blood.  Other blood products are available for specialized needs, such as hemophilia or other clotting disorders. BEFORE THE TRANSFUSION  Who gives blood for transfusions?   You may be able to donate blood to be used at a later date on yourself (autologous  donation).  Relatives can be asked to donate blood. This is generally not any safer than if you have received blood from a stranger. The same precautions are taken to ensure safety when a relative's blood is donated.  Healthy volunteers who are fully evaluated to make sure their blood is safe. This is blood bank blood. Transfusion therapy is the safest it has ever been in the practice of medicine. Before blood is taken from a donor, a complete history is taken to make sure that person has no history of diseases nor engages in risky social behavior (examples are intravenous drug use or sexual activity with multiple partners). The donor's travel history is screened to minimize risk of transmitting infections, such as malaria. The donated blood is tested for signs of infectious diseases, such as HIV and hepatitis. The blood is then tested to  be sure it is compatible with you in order to minimize the chance of a transfusion reaction. If you or a relative donates blood, this is often done in anticipation of surgery and is not appropriate for emergency situations. It takes many days to process the donated blood. RISKS AND COMPLICATIONS Although transfusion therapy is very safe and saves many lives, the main dangers of transfusion include:   Getting an infectious disease.  Developing a transfusion reaction. This is an allergic reaction to something in the blood you were given. Every precaution is taken to prevent this. The decision to have a blood transfusion has been considered carefully by your caregiver before blood is given. Blood is not given unless the benefits outweigh the risks.  AFTER SURGERY INSTRUCTIONS  Return to work: 4-6 weeks if applicable  Activity: 1. Be up and out of the bed during the day.  Take a nap if needed.  You may walk up steps but be careful and use the hand rail.  Stair climbing will tire you more than you think, you may need to stop part way and rest.   2. No lifting or  straining for 6 weeks over 10 pounds. No pushing, pulling, straining for 6 weeks.  3. No driving for 1 week(s).  Do not drive if you are taking narcotic pain medicine and make sure that your reaction time has returned.   4. You can shower as soon as the next day after surgery. Shower daily.  Use soap and water on your incision and pat dry; don't rub.  No tub baths or submerging your body in water until cleared by your surgeon. If you have the soap that was given to you by pre-surgical testing that was used before surgery, you do not need to use it afterwards because this can irritate your incisions.   5. No sexual activity and nothing in the vagina for 8 weeks.  6. You may experience a small amount of clear drainage from your incisions, which is normal.  If the drainage persists, increases, or changes color please call the office.  7. Do not use creams, lotions, or ointments such as neosporin on your incisions after surgery until advised by your surgeon because they can cause removal of the dermabond glue on your incisions.    8. You may experience vaginal spotting after surgery or around the 6-8 week mark from surgery when the stitches at the top of the vagina begin to dissolve.  The spotting is normal but if you experience heavy bleeding, call our office.  9. Take Tylenol or ibuprofen first for pain and only use narcotic pain medication for severe pain not relieved by the Tylenol or Ibuprofen.  Monitor your Tylenol intake to a max of 4,000 mg in a 24 hour period. You can alternate these medications after surgery.  Diet: 1. Low sodium Heart Healthy Diet is recommended.  2. It is safe to use a laxative, such as Miralax or Colace, if you have difficulty moving your bowels. You have been prescribed Sennakot at bedtime every evening to keep bowel movements regular and to prevent constipation.    Wound Care: 1. Keep clean and dry.  Shower daily.  Reasons to call the Doctor:  Fever - Oral  temperature greater than 100.4 degrees Fahrenheit  Foul-smelling vaginal discharge  Difficulty urinating  Nausea and vomiting  Increased pain at the site of the incision that is unrelieved with pain medicine.  Difficulty breathing with or without chest pain  New  calf pain especially if only on one side  Sudden, continuing increased vaginal bleeding with or without clots.   Contacts: For questions or concerns you should contact:  Dr. [name] at [phone]  Joylene John, NP at [phone]  After Hours: call [phone] and have the GYN Oncologist paged/contacted

## 2019-08-06 ENCOUNTER — Encounter: Payer: Self-pay | Admitting: Gynecologic Oncology

## 2019-08-06 ENCOUNTER — Encounter (HOSPITAL_COMMUNITY): Payer: Self-pay | Admitting: Gynecologic Oncology

## 2019-08-06 ENCOUNTER — Telehealth: Payer: Self-pay | Admitting: *Deleted

## 2019-08-06 NOTE — Progress Notes (Signed)
Spoke with Sharyn Lull at OB/GYN oncology office and made them aware patient is taking [name] which is a weight loss medication.

## 2019-08-06 NOTE — Telephone Encounter (Signed)
Patricia Waters from pre surgery center called and wanted to make sure the [facility] and Dr [name] knew the patient was on Saxenda (weight loss drug)

## 2019-08-11 ENCOUNTER — Other Ambulatory Visit (HOSPITAL_COMMUNITY)
Admission: RE | Admit: 2019-08-11 | Discharge: 2019-08-11 | Disposition: A | Discharge status: Home / Self Care | Payer: 59 | Source: Ambulatory Visit | Attending: Gynecologic Oncology | Admitting: Gynecologic Oncology

## 2019-08-11 ENCOUNTER — Other Ambulatory Visit: Payer: Self-pay

## 2019-08-11 ENCOUNTER — Encounter (HOSPITAL_COMMUNITY)
Admission: RE | Admit: 2019-08-11 | Discharge: 2019-08-11 | Disposition: A | Discharge status: Home / Self Care | Payer: 59 | Source: Ambulatory Visit | Attending: Gynecologic Oncology | Admitting: Gynecologic Oncology

## 2019-08-11 DIAGNOSIS — Z01818 Encounter for other preprocedural examination: Secondary | ICD-10-CM | POA: Insufficient documentation

## 2019-08-11 DIAGNOSIS — Z20822 Contact with and (suspected) exposure to covid-19: Secondary | ICD-10-CM | POA: Insufficient documentation

## 2019-08-11 LAB — COMPREHENSIVE METABOLIC PANEL
ALT: 31 U/L (ref 0–44)
AST: 33 U/L (ref 15–41)
Albumin: 4.3 g/dL (ref 3.5–5.0)
Alkaline Phosphatase: 114 U/L (ref 38–126)
Anion gap: 9 (ref 5–15)
BUN: 10 mg/dL (ref 8–23)
CO2: 28 mmol/L (ref 22–32)
Calcium: 9.3 mg/dL (ref 8.9–10.3)
Chloride: 106 mmol/L (ref 98–111)
Creatinine, Ser: 0.74 mg/dL (ref 0.44–1.00)
GFR calc Af Amer: >60 mL/min (ref >60)
GFR calc non Af Amer: >60 mL/min (ref >60)
Glucose, Bld: 98 mg/dL (ref 70–99)
Potassium: 4.3 mmol/L (ref 3.5–5.1)
Sodium: 143 mmol/L (ref 135–145)
Total Bilirubin: 0.8 mg/dL (ref 0.3–1.2)
Total Protein: 7.9 g/dL (ref 6.5–8.1)

## 2019-08-11 LAB — URINALYSIS, COMPLETE (UACMP) WITH MICROSCOPIC
Bacteria, UA: NONE SEEN
Bilirubin Urine: NEGATIVE
Glucose, UA: NEGATIVE mg/dL
Hgb urine dipstick: NEGATIVE
Ketones, ur: NEGATIVE mg/dL
Leukocytes,Ua: NEGATIVE
Nitrite: NEGATIVE
Protein, ur: NEGATIVE mg/dL
Specific Gravity, Urine: 1.003 — ABNORMAL LOW (ref 1.005–1.030)
pH: 8 (ref 5.0–8.0)

## 2019-08-11 LAB — CBC
HCT: 42.3 % (ref 36.0–46.0)
Hemoglobin: 13.4 g/dL (ref 12.0–15.0)
MCH: 31.8 pg (ref 26.0–34.0)
MCHC: 31.7 g/dL (ref 30.0–36.0)
MCV: 100.5 fL — ABNORMAL HIGH (ref 80.0–100.0)
Platelets: 314 10*3/uL (ref 150–400)
RBC: 4.21 MIL/uL (ref 3.87–5.11)
RDW: 12.6 % (ref 11.5–15.5)
WBC: 9.3 10*3/uL (ref 4.0–10.5)
nRBC: 0 % (ref 0.0–0.2)

## 2019-08-11 LAB — SARS CORONAVIRUS 2 (TAT 6-24 HRS): SARS Coronavirus 2: NEGATIVE

## 2019-08-12 ENCOUNTER — Encounter (HOSPITAL_COMMUNITY): Payer: Self-pay | Admitting: Gynecologic Oncology

## 2019-08-12 NOTE — Progress Notes (Signed)
Anesthesia Review:  [id number] metheney- LOV- 07/04/19 - epic  Cardiologist : Chest x-ray : EKG : 08/11/19- awaiting final  Echo : Cardiac Cath :  Activity level: can od flight of stairs without difficulty  Sleep Study/ CPAP : has Sleep apnea- uses nose pillows per pt - to bring DOS  Fasting Blood Sugar :      / Checks Blood Sugar -- times a day:   Blood Thinner/ Instructions /Last Dose: ASA / Instructions/ Last Dose :  PreDiabetes per pt - no no meds  Patient was on weight loss drug - Saxenda. - Had pt call office of DR [name] to make sure she was aware pt was on weight loss drug.  Per note in epic pt called and made them aware.  Last dose per pt on 08/07/2019.

## 2019-08-13 ENCOUNTER — Telehealth: Payer: Self-pay

## 2019-08-13 NOTE — Telephone Encounter (Signed)
Ms Patricia Waters states that she understands her written pre-op instructions.  She will be arriving to [facility] admitting at [facility]. She stopped her Saxenda on 08-06-19 as well as the [employer] as instructed by Joylene John, NP.

## 2019-08-14 ENCOUNTER — Ambulatory Visit (HOSPITAL_COMMUNITY): Payer: 59 | Admitting: Registered Nurse

## 2019-08-14 ENCOUNTER — Encounter (HOSPITAL_COMMUNITY)
Admission: RE | Disposition: A | Discharge status: Home / Self Care | Payer: Self-pay | Source: Home / Self Care | Attending: Gynecologic Oncology

## 2019-08-14 ENCOUNTER — Other Ambulatory Visit: Payer: Self-pay

## 2019-08-14 ENCOUNTER — Ambulatory Visit (HOSPITAL_COMMUNITY)
Admission: RE | Admit: 2019-08-14 | Discharge: 2019-08-14 | Disposition: A | Discharge status: Home / Self Care | Payer: 59 | Attending: Gynecologic Oncology | Admitting: Gynecologic Oncology

## 2019-08-14 ENCOUNTER — Encounter (HOSPITAL_COMMUNITY): Payer: Self-pay | Admitting: Gynecologic Oncology

## 2019-08-14 ENCOUNTER — Ambulatory Visit (HOSPITAL_COMMUNITY): Payer: 59 | Admitting: Physician Assistant

## 2019-08-14 DIAGNOSIS — N9489 Other specified conditions associated with female genital organs and menstrual cycle: Secondary | ICD-10-CM | POA: Insufficient documentation

## 2019-08-14 DIAGNOSIS — Z6838 Body mass index (BMI) 38.0-38.9, adult: Secondary | ICD-10-CM

## 2019-08-14 DIAGNOSIS — Z803 Family history of malignant neoplasm of breast: Secondary | ICD-10-CM | POA: Insufficient documentation

## 2019-08-14 DIAGNOSIS — G4730 Sleep apnea, unspecified: Secondary | ICD-10-CM | POA: Insufficient documentation

## 2019-08-14 DIAGNOSIS — C541 Malignant neoplasm of endometrium: Secondary | ICD-10-CM | POA: Diagnosis present

## 2019-08-14 DIAGNOSIS — C561 Malignant neoplasm of right ovary: Secondary | ICD-10-CM | POA: Insufficient documentation

## 2019-08-14 DIAGNOSIS — Z853 Personal history of malignant neoplasm of breast: Secondary | ICD-10-CM | POA: Diagnosis not present

## 2019-08-14 DIAGNOSIS — Z87891 Personal history of nicotine dependence: Secondary | ICD-10-CM | POA: Insufficient documentation

## 2019-08-14 DIAGNOSIS — Z8049 Family history of malignant neoplasm of other genital organs: Secondary | ICD-10-CM | POA: Diagnosis not present

## 2019-08-14 DIAGNOSIS — Z79899 Other long term (current) drug therapy: Secondary | ICD-10-CM | POA: Diagnosis not present

## 2019-08-14 DIAGNOSIS — D259 Leiomyoma of uterus, unspecified: Secondary | ICD-10-CM | POA: Diagnosis not present

## 2019-08-14 DIAGNOSIS — Z6841 Body Mass Index (BMI) 40.0 and over, adult: Secondary | ICD-10-CM | POA: Insufficient documentation

## 2019-08-14 HISTORY — PX: ROBOTIC ASSISTED TOTAL HYSTERECTOMY WITH BILATERAL SALPINGO OOPHERECTOMY: SHX6086

## 2019-08-14 HISTORY — DX: Other specified postprocedural states: Z98.890

## 2019-08-14 HISTORY — DX: Nausea with vomiting, unspecified: R11.2

## 2019-08-14 HISTORY — PX: SENTINEL NODE BIOPSY: SHX6608

## 2019-08-14 HISTORY — DX: Personal history of irradiation: Z92.3

## 2019-08-14 HISTORY — DX: Lymphedema, not elsewhere classified: I89.0

## 2019-08-14 HISTORY — DX: Unspecified osteoarthritis, unspecified site: M19.90

## 2019-08-14 HISTORY — DX: Prediabetes: R73.03

## 2019-08-14 LAB — TYPE AND SCREEN
ABO/RH(D): B NEG
Antibody Screen: NEGATIVE

## 2019-08-14 LAB — ABO/RH: ABO/RH(D): B NEG

## 2019-08-14 SURGERY — HYSTERECTOMY, TOTAL, ROBOT-ASSISTED, LAPAROSCOPIC, WITH BILATERAL SALPINGO-OOPHORECTOMY
Anesthesia: General

## 2019-08-14 MED ORDER — PROPOFOL 10 MG/ML IV BOLUS
INTRAVENOUS | Status: DC | PRN
  Administered 2019-08-14: 160 mg via INTRAVENOUS

## 2019-08-14 MED ORDER — ACETAMINOPHEN 500 MG PO TABS
1000.0000 mg | ORAL_TABLET | ORAL | Status: AC
  Administered 2019-08-14: 1000 mg via ORAL
  Filled 2019-08-14: qty 2

## 2019-08-14 MED ORDER — ROCURONIUM BROMIDE 10 MG/ML (PF) SYRINGE
PREFILLED_SYRINGE | INTRAVENOUS | Status: AC
  Filled 2019-08-14: qty 10

## 2019-08-14 MED ORDER — ONDANSETRON HCL 4 MG/2ML IJ SOLN
INTRAMUSCULAR | Status: AC
  Filled 2019-08-14: qty 2

## 2019-08-14 MED ORDER — DEXAMETHASONE SODIUM PHOSPHATE 4 MG/ML IJ SOLN: 4.0000 mg | INTRAMUSCULAR | Status: DC

## 2019-08-14 MED ORDER — FENTANYL CITRATE (PF) 100 MCG/2ML IJ SOLN
INTRAMUSCULAR | Status: AC
  Filled 2019-08-14: qty 2

## 2019-08-14 MED ORDER — LACTATED RINGERS IV SOLN: INTRAVENOUS | Status: DC

## 2019-08-14 MED ORDER — FENTANYL CITRATE (PF) 100 MCG/2ML IJ SOLN
25.0000 ug | INTRAMUSCULAR | Status: DC | PRN
  Administered 2019-08-14: 50 ug via INTRAVENOUS

## 2019-08-14 MED ORDER — OXYCODONE HCL 5 MG PO TABS: 5.0000 mg | ORAL_TABLET | Freq: Once | ORAL | Status: DC | PRN

## 2019-08-14 MED ORDER — SODIUM CHLORIDE 0.9% FLUSH: 3.0000 mL | INTRAVENOUS | Status: DC | PRN

## 2019-08-14 MED ORDER — ACETAMINOPHEN 160 MG/5ML PO SOLN: 1000.0000 mg | Freq: Once | ORAL | Status: DC | PRN

## 2019-08-14 MED ORDER — CELECOXIB 200 MG PO CAPS
400.0000 mg | ORAL_CAPSULE | ORAL | Status: AC
  Administered 2019-08-14: 400 mg via ORAL
  Filled 2019-08-14: qty 2

## 2019-08-14 MED ORDER — ENOXAPARIN SODIUM 40 MG/0.4ML ~~LOC~~ SOLN
40.0000 mg | SUBCUTANEOUS | Status: AC
  Administered 2019-08-14: 40 mg via SUBCUTANEOUS
  Filled 2019-08-14: qty 0.4

## 2019-08-14 MED ORDER — CHLORHEXIDINE GLUCONATE 0.12 % MT SOLN
15.0000 mL | Freq: Once | OROMUCOSAL | Status: AC
  Administered 2019-08-14: 15 mL via OROMUCOSAL

## 2019-08-14 MED ORDER — OXYCODONE HCL 5 MG/5ML PO SOLN: 5.0000 mg | Freq: Once | ORAL | Status: DC | PRN

## 2019-08-14 MED ORDER — ACETAMINOPHEN 650 MG RE SUPP
650.0000 mg | RECTAL | Status: DC | PRN
  Filled 2019-08-14: qty 1

## 2019-08-14 MED ORDER — LIDOCAINE HCL 2 % IJ SOLN
INTRAMUSCULAR | Status: AC
  Filled 2019-08-14: qty 20

## 2019-08-14 MED ORDER — CEFAZOLIN SODIUM-DEXTROSE 2-4 GM/100ML-% IV SOLN
2.0000 g | INTRAVENOUS | Status: AC
  Administered 2019-08-14: 2 g via INTRAVENOUS
  Filled 2019-08-14: qty 100

## 2019-08-14 MED ORDER — KETAMINE HCL 10 MG/ML IJ SOLN
INTRAMUSCULAR | Status: AC
  Filled 2019-08-14: qty 1

## 2019-08-14 MED ORDER — OXYCODONE HCL 5 MG PO TABS: 5.0000 mg | ORAL_TABLET | ORAL | Status: DC | PRN

## 2019-08-14 MED ORDER — DEXAMETHASONE SODIUM PHOSPHATE 10 MG/ML IJ SOLN
INTRAMUSCULAR | Status: DC | PRN
  Administered 2019-08-14: 8 mg via INTRAVENOUS

## 2019-08-14 MED ORDER — PROPOFOL 10 MG/ML IV BOLUS
INTRAVENOUS | Status: AC
  Filled 2019-08-14: qty 20

## 2019-08-14 MED ORDER — LACTATED RINGERS IR SOLN
Status: DC | PRN
  Administered 2019-08-14: 1000 mL

## 2019-08-14 MED ORDER — FENTANYL CITRATE (PF) 100 MCG/2ML IJ SOLN
INTRAMUSCULAR | Status: DC | PRN
  Administered 2019-08-14 (×2): 50 ug via INTRAVENOUS
  Administered 2019-08-14: 25 ug via INTRAVENOUS

## 2019-08-14 MED ORDER — FENTANYL CITRATE (PF) 250 MCG/5ML IJ SOLN
INTRAMUSCULAR | Status: AC
  Filled 2019-08-14: qty 5

## 2019-08-14 MED ORDER — AMISULPRIDE (ANTIEMETIC) 5 MG/2ML IV SOLN: 5.0000 mg | Freq: Once | INTRAVENOUS | Status: AC

## 2019-08-14 MED ORDER — LIDOCAINE 2% (20 MG/ML) 5 ML SYRINGE
INTRAMUSCULAR | Status: DC | PRN
  Administered 2019-08-14: 1.5 mg/kg/h via INTRAVENOUS
  Administered 2019-08-14: 80 mg via INTRAVENOUS

## 2019-08-14 MED ORDER — STERILE WATER FOR INJECTION IJ SOLN
INTRAMUSCULAR | Status: AC
  Filled 2019-08-14: qty 10

## 2019-08-14 MED ORDER — BUPIVACAINE HCL 0.25 % IJ SOLN
INTRAMUSCULAR | Status: AC
  Filled 2019-08-14: qty 1

## 2019-08-14 MED ORDER — BUPIVACAINE HCL 0.25 % IJ SOLN
INTRAMUSCULAR | Status: DC | PRN
  Administered 2019-08-14: 20 mL

## 2019-08-14 MED ORDER — AMISULPRIDE (ANTIEMETIC) 5 MG/2ML IV SOLN
INTRAVENOUS | Status: AC
  Administered 2019-08-14: 5 mg via INTRAVENOUS
  Filled 2019-08-14: qty 2

## 2019-08-14 MED ORDER — SCOPOLAMINE 1 MG/3DAYS TD PT72
1.0000 | MEDICATED_PATCH | TRANSDERMAL | Status: DC
  Administered 2019-08-14: 1.5 mg via TRANSDERMAL
  Filled 2019-08-14: qty 1

## 2019-08-14 MED ORDER — SUGAMMADEX SODIUM 500 MG/5ML IV SOLN
INTRAVENOUS | Status: DC | PRN
Start: 2019-08-14 — End: 2019-08-14
  Administered 2019-08-14: 300 mg via INTRAVENOUS

## 2019-08-14 MED ORDER — SODIUM CHLORIDE 0.9% FLUSH: 3.0000 mL | Freq: Two times a day (BID) | INTRAVENOUS | Status: DC

## 2019-08-14 MED ORDER — ACETAMINOPHEN 10 MG/ML IV SOLN: 1000.0000 mg | Freq: Once | INTRAVENOUS | Status: DC | PRN

## 2019-08-14 MED ORDER — MIDAZOLAM HCL 5 MG/5ML IJ SOLN
INTRAMUSCULAR | Status: DC | PRN
  Administered 2019-08-14: 2 mg via INTRAVENOUS

## 2019-08-14 MED ORDER — ORAL CARE MOUTH RINSE: 15.0000 mL | Freq: Once | OROMUCOSAL | Status: AC

## 2019-08-14 MED ORDER — ACETAMINOPHEN 500 MG PO TABS: 1000.0000 mg | ORAL_TABLET | Freq: Once | ORAL | Status: DC | PRN

## 2019-08-14 MED ORDER — ROCURONIUM BROMIDE 10 MG/ML (PF) SYRINGE
PREFILLED_SYRINGE | INTRAVENOUS | Status: DC | PRN
  Administered 2019-08-14: 60 mg via INTRAVENOUS

## 2019-08-14 MED ORDER — KETAMINE HCL 10 MG/ML IJ SOLN
INTRAMUSCULAR | Status: DC | PRN
Start: 2019-08-14 — End: 2019-08-14
  Administered 2019-08-14 (×2): 15 mg via INTRAVENOUS

## 2019-08-14 MED ORDER — GABAPENTIN 300 MG PO CAPS
300.0000 mg | ORAL_CAPSULE | ORAL | Status: AC
  Administered 2019-08-14: 300 mg via ORAL
  Filled 2019-08-14: qty 1

## 2019-08-14 MED ORDER — LIDOCAINE 2% (20 MG/ML) 5 ML SYRINGE
INTRAMUSCULAR | Status: AC
  Filled 2019-08-14: qty 5

## 2019-08-14 MED ORDER — SUGAMMADEX SODIUM 500 MG/5ML IV SOLN
INTRAVENOUS | Status: AC
  Filled 2019-08-14: qty 5

## 2019-08-14 MED ORDER — MIDAZOLAM HCL 2 MG/2ML IJ SOLN
INTRAMUSCULAR | Status: AC
  Filled 2019-08-14: qty 2

## 2019-08-14 MED ORDER — SODIUM CHLORIDE 0.9 % IV SOLN: 250.0000 mL | INTRAVENOUS | Status: DC | PRN

## 2019-08-14 MED ORDER — ACETAMINOPHEN 325 MG PO TABS: 650.0000 mg | ORAL_TABLET | ORAL | Status: DC | PRN

## 2019-08-14 MED ORDER — STERILE WATER FOR IRRIGATION IR SOLN
Status: DC | PRN
  Administered 2019-08-14: 1000 mL

## 2019-08-14 MED ORDER — MORPHINE SULFATE (PF) 4 MG/ML IV SOLN: 2.0000 mg | INTRAVENOUS | Status: DC | PRN

## 2019-08-14 MED ORDER — ONDANSETRON HCL 4 MG/2ML IJ SOLN
INTRAMUSCULAR | Status: DC | PRN
  Administered 2019-08-14: 4 mg via INTRAVENOUS

## 2019-08-14 SURGICAL SUPPLY — 72 items
ADH SKN CLS APL DERMABOND .7 (GAUZE/BANDAGES/DRESSINGS) ×1
AGENT HMST KT MTR STRL THRMB (HEMOSTASIS)
APL ESCP 34 STRL LF DISP (HEMOSTASIS)
APPLICATOR SURGIFLO ENDO (HEMOSTASIS) IMPLANT
BACTOSHIELD CHG 4% 4OZ (MISCELLANEOUS) ×1
BAG LAPAROSCOPIC 12 15 PORT 16 (BASKET) IMPLANT
BAG RETRIEVAL 12/15 (BASKET)
BAG SPEC RTRVL LRG 6X4 10 (ENDOMECHANICALS)
BLADE SURG SZ10 CARB STEEL (BLADE) IMPLANT
COVER BACK TABLE 60X90IN (DRAPES) ×2 IMPLANT
COVER TIP SHEARS 8 DVNC (MISCELLANEOUS) ×1 IMPLANT
COVER TIP SHEARS 8MM DA VINCI (MISCELLANEOUS) ×2
COVER WAND RF STERILE (DRAPES) IMPLANT
DECANTER SPIKE VIAL GLASS SM (MISCELLANEOUS) IMPLANT
DERMABOND ADVANCED (GAUZE/BANDAGES/DRESSINGS) ×1
DERMABOND ADVANCED .7 DNX12 (GAUZE/BANDAGES/DRESSINGS) ×1 IMPLANT
DRAPE ARM DVNC X/XI (DISPOSABLE) ×4 IMPLANT
DRAPE COLUMN DVNC XI (DISPOSABLE) ×1 IMPLANT
DRAPE DA VINCI XI ARM (DISPOSABLE) ×8
DRAPE DA VINCI XI COLUMN (DISPOSABLE) ×2
DRAPE SHEET LG 3/4 BI-LAMINATE (DRAPES) ×2 IMPLANT
DRAPE SURG IRRIG POUCH 19X23 (DRAPES) ×2 IMPLANT
DRSG OPSITE POSTOP 4X6 (GAUZE/BANDAGES/DRESSINGS) IMPLANT
DRSG OPSITE POSTOP 4X8 (GAUZE/BANDAGES/DRESSINGS) IMPLANT
DRSG TEGADERM 2-3/8X2-3/4 SM (GAUZE/BANDAGES/DRESSINGS) ×1 IMPLANT
ELECT REM PT RETURN 15FT ADLT (MISCELLANEOUS) ×2 IMPLANT
GAUZE SPONGE 2X2 8PLY STRL LF (GAUZE/BANDAGES/DRESSINGS) IMPLANT
GLOVE BIO SURGEON STRL SZ 6 (GLOVE) ×8 IMPLANT
GLOVE BIO SURGEON STRL SZ 6.5 (GLOVE) ×4 IMPLANT
GOWN STRL REUS W/ TWL LRG LVL3 (GOWN DISPOSABLE) ×4 IMPLANT
GOWN STRL REUS W/TWL LRG LVL3 (GOWN DISPOSABLE) ×8
HOLDER FOLEY CATH W/STRAP (MISCELLANEOUS) ×2 IMPLANT
IRRIG SUCT STRYKERFLOW 2 WTIP (MISCELLANEOUS) ×2
IRRIGATION SUCT STRKRFLW 2 WTP (MISCELLANEOUS) ×1 IMPLANT
KIT PROCEDURE DA VINCI SI (MISCELLANEOUS) ×2
KIT PROCEDURE DVNC SI (MISCELLANEOUS) IMPLANT
KIT TURNOVER KIT A (KITS) IMPLANT
MANIPULATOR UTERINE 4.5 ZUMI (MISCELLANEOUS) ×2 IMPLANT
NDL SPNL 18GX3.5 QUINCKE PK (NEEDLE) IMPLANT
NEEDLE HYPO 22GX1.5 SAFETY (NEEDLE) ×2 IMPLANT
NEEDLE SPNL 18GX3.5 QUINCKE PK (NEEDLE) ×2 IMPLANT
OBTURATOR OPTICAL STANDARD 8MM (TROCAR) ×2
OBTURATOR OPTICAL STND 8 DVNC (TROCAR) ×1
OBTURATOR OPTICALSTD 8 DVNC (TROCAR) ×1 IMPLANT
PACK ROBOT GYN CUSTOM WL (TRAY / TRAY PROCEDURE) ×2 IMPLANT
PAD POSITIONING PINK XL (MISCELLANEOUS) ×2 IMPLANT
PENCIL SMOKE EVACUATOR (MISCELLANEOUS) IMPLANT
PORT ACCESS TROCAR AIRSEAL 12 (TROCAR) ×1 IMPLANT
PORT ACCESS TROCAR AIRSEAL 5M (TROCAR) ×1
POUCH SPECIMEN RETRIEVAL 10MM (ENDOMECHANICALS) IMPLANT
SCRUB CHG 4% DYNA-HEX 4OZ (MISCELLANEOUS) ×1 IMPLANT
SEAL CANN UNIV 5-8 DVNC XI (MISCELLANEOUS) ×3 IMPLANT
SEAL XI 5MM-8MM UNIVERSAL (MISCELLANEOUS) ×6
SET TRI-LUMEN FLTR TB AIRSEAL (TUBING) ×2 IMPLANT
SPONGE GAUZE 2X2 STER 10/PKG (GAUZE/BANDAGES/DRESSINGS) ×1
SPONGE LAP 18X18 RF (DISPOSABLE) IMPLANT
SURGIFLO W/THROMBIN 8M KIT (HEMOSTASIS) IMPLANT
SUT MNCRL AB 4-0 PS2 18 (SUTURE) IMPLANT
SUT PDS AB 1 TP1 96 (SUTURE) IMPLANT
SUT VIC AB 0 CT1 27 (SUTURE)
SUT VIC AB 0 CT1 27XBRD ANTBC (SUTURE) IMPLANT
SUT VIC AB 2-0 CT1 27 (SUTURE)
SUT VIC AB 2-0 CT1 TAPERPNT 27 (SUTURE) IMPLANT
SUT VICRYL 4-0 PS2 18IN ABS (SUTURE) ×4 IMPLANT
SYR 10ML LL (SYRINGE) IMPLANT
TOWEL OR NON WOVEN STRL DISP B (DISPOSABLE) ×2 IMPLANT
TRAP SPECIMEN MUCUS 40CC (MISCELLANEOUS) IMPLANT
TRAY FOLEY MTR SLVR 16FR STAT (SET/KITS/TRAYS/PACK) ×2 IMPLANT
TROCAR XCEL NON-BLD 5MMX100MML (ENDOMECHANICALS) IMPLANT
UNDERPAD 30X36 HEAVY ABSORB (UNDERPADS AND DIAPERS) ×2 IMPLANT
WATER STERILE IRR 1000ML POUR (IV SOLUTION) ×2 IMPLANT
YANKAUER SUCT BULB TIP 10FT TU (MISCELLANEOUS) IMPLANT

## 2019-08-14 NOTE — Anesthesia Postprocedure Evaluation (Signed)
Anesthesia Post Note  Patient: KENLEY RETTINGER  Procedure(s) Performed: XI ROBOTIC ASSISTED TOTAL HYSTERECTOMY WITH BILATERAL SALPINGO OOPHORECTOMY (N/A ) SENTINEL NODE BIOPSY (N/A )     Patient location during evaluation: PACU Anesthesia Type: General Level of consciousness: awake and alert Pain management: pain level controlled Vital Signs Assessment: post-procedure vital signs reviewed and stable Respiratory status: spontaneous breathing, nonlabored ventilation, respiratory function stable and patient connected to nasal cannula oxygen Cardiovascular status: blood pressure returned to baseline and stable Postop Assessment: no apparent nausea or vomiting Anesthetic complications: no   No complications documented.  Last Vitals:  Vitals:   08/14/19 1200 08/14/19 1221  BP: 124/62 138/69  Pulse: (!) 56 (!) 54  Resp: 11 14  Temp: 36.4 C 36.4 C  SpO2: 98% 94%    Last Pain:  Vitals:   08/14/19 1221  TempSrc: Oral  PainSc: 0-No pain                 Kimyah Frein

## 2019-08-14 NOTE — Discharge Instructions (Signed)
08/14/2019  Return to work: 4 weeks  Activity: 1. Be up and out of the bed during the day.  Take a nap if needed.  You may walk up steps but be careful and use the hand rail.  Stair climbing will tire you more than you think, you may need to stop part way and rest.   2. No lifting or straining for 4 weeks.  3. No driving for 1 weeks.  Do Not drive if you are taking narcotic pain medicine.  4. Shower daily.  Use soap and water on your incision and pat dry; don't rub.   5. No sexual activity and nothing in the vagina for 8 weeks.  Medications:  - Take ibuprofen and tylenol first line for pain control. Take these regularly (every 6 hours) to decrease the build up of pain.  - If necessary, for severe pain not relieved by ibuprofen, take percocet.  - While taking percocet you should take sennakot every night to reduce the likelihood of constipation. If this causes diarrhea, stop its use.  Diet: 1. Low sodium Heart Healthy Diet is recommended.  2. It is safe to use a laxative if you have difficulty moving your bowels.   Wound Care: 1. Keep clean and dry.  Shower daily. 2. You can get the dressing wet in the shower, but avoid tub baths. 3. Remove the dressings 2 days after your surgery or sooner if they becomes saturated with blood.  4. After the dressing is removed you can get the incision wet in the shower, however continue to avoid tub baths until advised otherwise by your surgeon at follow-up.   Reasons to call the Doctor:   Fever - Oral temperature greater than 100.4 degrees Fahrenheit  Foul-smelling vaginal discharge  Difficulty urinating  Nausea and vomiting  Increased pain at the site of the incision that is unrelieved with pain medicine.  Difficulty breathing with or without chest pain  New calf pain especially if only on one side  Sudden, continuing increased vaginal bleeding with or without clots.   Follow-up: 1. See [name] in 3 weeks.  Contacts: For  questions or concerns you should contact:  Dr. [name] at [phone] After hours and on week-ends call [phone] and ask to speak to the physician on call for Gynecologic Oncology   After Your Surgery  The information in this section will tell you what to expect after your surgery, both during your stay and after you leave. You will learn how to safely recover from your surgery. Write down any questions you have and be sure to ask your doctor or nurse.  What to Expect When you wake up after your surgery, you will be in the [facility] (PACU) or your recovery room. A nurse will be monitoring your body temperature, blood pressure, pulse, and oxygen levels. You may have a urinary catheter in your bladder to help monitor the amount of urine you are making. It should come out before you go home. You will also have compression boots on your lower legs to help your circulation. Your pain medication will be given through an IV line or in tablet form. If you are having pain, tell your nurse. Your nurse will tell you how to recover from your surgery. Below are examples of ways you can help yourself recover safely. . You will be encouraged to walk with the help of your nurse or physical therapist. We will give you medication to relieve pain. Walking  helps reduce the risk for blood clots and pneumonia. It also helps to stimulate your bowels so they begin working again. . Use your incentive spirometer. This will help your lungs expand, which prevents pneumonia.   Commonly Asked Questions  Will I have pain after surgery? Yes, you will have some pain after your surgery, especially in the first few days. Your doctor and nurse will ask you about your pain often. You will be given medication to manage your pain as needed. If your pain is not relieved, please tell your doctor or nurse. It is important to control your pain so you can cough, breathe deeply, use your incentive spirometer, and  get out of bed and walk.  Will I be able to eat? Yes, you will be able to eat a regular diet or eat as tolerated. You should start with foods that are soft and easy to digest such as apple sauce and chicken noodle soup. Eat small meals frequently, and then advance to regular foods. If you experience bloating, gas, or cramps, limit high-fiber foods, including whole grain breads and cereal, nuts, seeds, salads, fresh fruit, broccoli, cabbage, and cauliflower. Will I have pain when I am home? The length of time each person has pain or discomfort varies. You may still have some pain when you go home and will probably be taking pain medication. Follow the guidelines below. . Take your medications as directed and as needed. . Call your doctor if the medication prescribed for you doesn't relieve your pain. . Don't drive or drink alcohol while you're taking prescription pain medication. . As your incision heals, you will have less pain and need less pain medication. A mild pain reliever such as acetaminophen (Tylenol) or ibuprofen (Advil) will relieve aches and discomfort. However, large quantities of acetaminophen may be harmful to your liver. Don't take more acetaminophen than the amount directed on the bottle or as instructed by your doctor or nurse. . Pain medication should help you as you resume your normal activities. Take enough medication to do your exercises comfortably. Pain medication is most effective 30 to 45 minutes after taking it. Marland Kitchen Keep track of when you take your pain medication. Taking it when your pain first begins is more effective than waiting for the pain to get worse. Pain medication may cause constipation (having fewer bowel movements than what is normal for you).  How can I prevent constipation? . Go to the bathroom at the same time every day. Your body will get used to going at that time. . If you feel the urge to go, don't put it off. Try to use the bathroom 5 to 15 minutes  after meals. . After breakfast is a good time to move your bowels. The reflexes in your colon are strongest at this time. . Exercise, if you can. Walking is an excellent form of exercise. . Drink 8 (8-ounce) glasses (2 liters) of liquids daily, if you can. Drink water, juices, soups, ice cream shakes, and other drinks that don't have caffeine. Drinks with caffeine, such as coffee and soda, pull fluid out of the body. . Slowly increase the fiber in your diet to 25 to 35 grams per day. Fruits, vegetables, whole grains, and cereals contain fiber. If you have an ostomy or have had recent bowel surgery, check with your doctor or nurse before making any changes in your diet. . Both over-the-counter and prescription medications are available to treat constipation. Start with 1 of the following over-the-counter medications first:  o Docusate sodium (Colace) 100 mg. Take ___1__ capsules _2____ times a day. This is a stool softener that causes few side effects. Don't take it with mineral oil. o Polyethylene glycol (MiraLAX) 17 grams daily. o Senna (Senokot) 2 tablets at bedtime. This is a stimulant laxative, which can cause cramping. . If you haven't had a bowel movement in 2 days, call your doctor or nurse.  Can I shower? Yes, you should shower 24 hours after your surgery. Be sure to shower every day. Taking a warm shower is relaxing and can help decrease muscle aches. Use soap when you shower and gently wash your incision. Pat the areas dry with a towel after showering, and leave your incision uncovered (unless there is drainage). Call your doctor if you see any redness or drainage from your incision. Don't take tub baths until you discuss it with your doctor at the first appointment after your surgery. How do I care for my incisions? You will have several small incisions on your abdomen. The incisions are closed with Steri-Strips or Dermabond. You may also have square white dressings on your incisions  ([place]). You can remove these in the shower 24 hours after your surgery. You should clean your incisions with soap and water. If you go home with Steri-Strips on your incision, they will loosen and may fall off by themselves. If they haven't fallen off within 10 days, you can remove them. If you go home with Dermabond over your sutures (stitches), it will also loosen and peel off.  What are the most common symptoms after a hysterectomy? It's common for you to have some vaginal spotting or light bleeding. You should monitor this with a pad or a panty liner. If you have having heavy bleeding (bleeding through a pad or liner every 1 to 2 hours), call your doctor right away. It's also common to have some discomfort after surgery from the air that was pumped into your abdomen during surgery. To help with this, walk, drink plenty of liquids and make sure to take the stool softeners you received.  When is it safe for me to drive? You may resume driving 2 weeks after surgery, as long as you are not taking pain medication that may make you drowsy.  When can I resume sexual activity? Do not place anything in your vagina or have vaginal intercourse for 8 weeks after your surgery. Some people will need to wait longer than 8 weeks, so speak with your doctor before resuming sexual intercourse.  Will I be able to travel? Yes, you can travel. If you are traveling by plane within a few weeks after your surgery, make sure you get up and walk every hour. Be sure to stretch your legs, drink plenty of liquids, and keep your feet elevated when possible.  Will I need any supplies? Most people do not need any supplies after the surgery. In the rare case that you do need supplies, such as tubes or drains, your nurse will order them for you.  When can I return to work? The time it takes to return to work depends on the type of work you do, the type of surgery you had, and how fast your body heals. Most people can  return to work about 2 to 4 weeks after the surgery.  What exercises can I do? Exercise will help you gain strength and feel better. Walking and stair climbing are excellent forms of exercise. Gradually increase the distance you walk. Climb stairs slowly,  resting or stopping as needed. Ask your doctor or nurse before starting more strenuous exercises.  When can I lift heavy objects? Most people should not lift anything heavier than 10 pounds (4.5 kilograms) for at least 4 weeks after surgery. Speak with your doctor about when you can do heavy lifting.  How can I cope with my feelings? After surgery for a serious illness, you may have new and upsetting feelings. Many people say they felt weepy, sad, worried, nervous, irritable, and angry at one time or another. You may find that you can't control some of these feelings. If this happens, it's a good idea to seek emotional support. The first step in coping is to talk about how you feel. Family and friends can help. Your nurse, doctor, and social worker can reassure, support, and guide you. It's always a good idea to let these professionals know how you, your family, and your friends are feeling emotionally. Many resources are available to patients and their families. Whether you're in the hospital or at home, the nurses, doctors, and social workers are here to help you and your family and friends handle the emotional aspects of your illness.  When is my first appointment after surgery? Your first appointment after surgery will be 2 to 4 weeks after surgery. Your nurse will give you instructions on how to make this appointment, including the phone number to call.  What if I have other questions? If you have any questions or concerns, please talk with your doctor or nurse. You can reach them Monday through Friday from 9:00 am to 5:00 pm. After 5:00 pm, during the weekend, and on holidays, call [phone] and ask for the doctor on call for your  doctor.  . Have a temperature of 101 F (38.3 C) or higher . Have pain that does not get better with pain medication . Have redness, drainage, or swelling from your incisions

## 2019-08-14 NOTE — Op Note (Addendum)
OPERATIVE NOTE 08/14/19  Surgeon: Donaciano Eva   Assistants: Dr Lahoma Crocker (an MD assistant was necessary for tissue manipulation, management of robotic instrumentation, retraction and positioning due to the complexity of the case and hospital policies).   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 Brisk oozing from skin incisions.   Estimated Blood Loss:  30cc      Total IV Fluids: 900 ml         Specimens: uterus, cervix, bilateral tubes and ovaries, right proximal obturator SLN, right distal obturator SLN, left external iliac SLN.          Complications:  None; patient tolerated the procedure well.         Disposition: PACU - hemodynamically stable.  Procedure Details  The patient was seen in the Holding Room. The risks, benefits, complications, treatment options, and expected outcomes were discussed with the patient.  The patient concurred with the proposed plan, giving informed consent.  The site of surgery properly noted/marked. The patient was identified as Patricia Waters and the procedure verified as a Robotic-assisted hysterectomy with bilateral salpingo oophorectomy with SLN biopsy. A Time Out was held and the above information confirmed.  After induction of anesthesia, the patient was draped and prepped in the usual sterile manner. Pt was placed in supine position after anesthesia and draped and prepped in the usual sterile manner. The abdominal drape was placed after the CholoraPrep had been allowed to dry  for 3 minutes.  Her arms were tucked to her side with all appropriate precautions.  The shoulders were stabilized with padded shoulder blocks applied to the acromium processes.  The patient was placed in the semi-lithotomy position in [place].  The perineum was prepped with Betadine. The patient was then prepped. Foley catheter was placed.  A sterile speculum was placed in the vagina.  The cervix was grasped with a single-tooth tenaculum. 2mg  total of ICG was injected into the cervical stroma at 2 and 9 o'clock with 1cc injected at a 1cm and 64mm depth (concentration 0.5mg /ml) in all locations. The cervix was dilated with Kennon Rounds dilators.  The ZUMI uterine manipulator with a medium colpotomizer ring was placed without difficulty.  A pneum occluder balloon was placed over the manipulator.  OG tube placement was confirmed and to suction.   Next, a 5 mm skin incision was made 1 cm below the subcostal margin in the midclavicular line.  The 5 mm Optiview port and scope was used for direct entry.  Opening pressure was under 10 mm CO2.  The abdomen was insufflated and the findings were noted as above.   At this point and all points during the procedure, the patient's intra-abdominal pressure did not exceed 15 mmHg. Next, a 10 mm skin incision was made in the umbilicus and a right and left port was placed about 10 cm lateral to the robot port on the right and left side.  A fourth arm was placed in the left lower quadrant 2 cm above and superior and medial to the anterior superior iliac spine.  All ports were placed under direct visualization.  The patient  was placed in steep Trendelenburg.  Bowel was folded away into the upper abdomen.  The robot was docked in the normal manner.  The right and left peritoneum were opened parallel to the IP ligament to open the retroperitoneal spaces bilaterally. The SLN mapping was performed in bilateral pelvic basins. The para rectal and paravesical spaces were opened up entirely  with careful dissection below the level of the ureters bilaterally and to the depth of the uterine artery origin in order to skeletonize the uterine "web" and ensure visualization of all parametrial channels. The para-aortic basins were carefully exposed and evaluated for isolated para-aortic SLN's. Lymphatic channels were identified travelling to the following visualized sentinel lymph node's: right proximal obturator SLN, right distal obturator SLN, left external iliac SLN. These SLN's were separated from their surrounding lymphatic tissue, removed and sent for permanent pathology.  The hysterectomy was started after the round ligament on the right side was incised and the retroperitoneum was entered and the pararectal space was developed.  The ureter was noted to be on the medial leaf of the broad ligament.  The peritoneum above the ureter was incised and stretched and the infundibulopelvic ligament was skeletonized, cauterized and cut.  The posterior peritoneum was taken down to the level of the KOH ring.  The anterior peritoneum was also taken down.  The bladder flap was created to the level of the KOH ring.  The uterine artery on the right side was skeletonized, cauterized and cut in the normal manner.  A similar procedure was performed on the left.  The colpotomy was made and the uterus, cervix, bilateral ovaries and tubes were amputated and delivered through the vagina.  Pedicles were inspected and excellent hemostasis was achieved.    The colpotomy at the vaginal cuff was closed with Vicryl on a CT1 needle in a running manner.  Irrigation was used and excellent hemostasis was achieved.  At this point in the procedure was completed.  Robotic instruments were removed under direct visulaization.  The robot was undocked. The 10 mm ports were closed with Vicryl on a UR-5 needle and the fascia was closed with 0 Vicryl on a UR-5 needle.  The skin was closed with 4-0 Vicryl in a subcuticular manner.  Dermabond  was applied.  Sponge, lap and needle counts correct x 2.  The patient was taken to the recovery room in stable condition.  The vagina was swabbed with  minimal bleeding noted which was made hemostatic with interrupted sutures at the introitus and periurethral tissues.   All instrument and needle counts were correct x  3.   The patient was transferred to the recovery room in a stable condition.  Donaciano Eva, MD

## 2019-08-14 NOTE — Transfer of Care (Signed)
Immediate Anesthesia Transfer of Care Note  Patient: Patricia Waters  Procedure(s) Performed: XI ROBOTIC ASSISTED TOTAL HYSTERECTOMY WITH BILATERAL SALPINGO OOPHORECTOMY (N/A ) SENTINEL NODE BIOPSY (N/A )  Patient Location: PACU  Anesthesia Type:General  Level of Consciousness: awake, alert , oriented and patient cooperative  Airway & Oxygen Therapy: Patient Spontanous Breathing and Patient connected to face mask oxygen  Post-op Assessment: Report given to RN, Post -op Vital signs reviewed and stable and Patient moving all extremities  Post vital signs: Reviewed and stable  Last Vitals:  Vitals Value Taken Time  BP 128/64 08/14/19 1119  Temp    Pulse 66 08/14/19 1122  Resp 16 08/14/19 1122  SpO2 100 % 08/14/19 1122  Vitals shown include unvalidated device data.  Last Pain:  Vitals:   08/14/19 0724  TempSrc:   PainSc: 0-No pain      Patients Stated Pain Goal: 4 (32/35/57 3220)  Complications: No complications documented.

## 2019-08-14 NOTE — Anesthesia Preprocedure Evaluation (Signed)
Anesthesia Evaluation  Patient identified by MRN, date of birth, ID band Patient awake    Reviewed: Allergy & Precautions, NPO status , Patient's Chart, lab work & pertinent test results  History of Anesthesia Complications (+) PONV and history of anesthetic complications  Airway Mallampati: II  TM Distance: >3 FB Neck ROM: Full    Dental  (+) Dental Advisory Given, Teeth Intact   Pulmonary neg shortness of breath, sleep apnea and Continuous Positive Airway Pressure Ventilation , neg COPD, neg recent URI, former smoker,    breath sounds clear to auscultation       Cardiovascular negative cardio ROS   Rhythm:Regular     Neuro/Psych negative neurological ROS  negative psych ROS   GI/Hepatic negative GI ROS, Neg liver ROS,   Endo/Other  Morbid obesity  Renal/GU negative Renal ROS     Musculoskeletal  (+) Arthritis ,   Abdominal   Peds  Hematology   Anesthesia Other Findings H/o breast ca with right arm lymph edema  Reproductive/Obstetrics                             Anesthesia Physical Anesthesia Plan  ASA: III  Anesthesia Plan: General   Post-op Pain Management:    Induction: Intravenous  PONV Risk Score and Plan: 4 or greater and Ondansetron, Dexamethasone and Treatment may vary due to age or medical condition  Airway Management Planned: Oral ETT  Additional Equipment: None  Intra-op Plan:   Post-operative Plan: Extubation in OR  Informed Consent: I have reviewed the patients History and Physical, chart, labs and discussed the procedure including the risks, benefits and alternatives for the proposed anesthesia with the patient or authorized representative who has indicated his/her understanding and acceptance.     Dental advisory given  Plan Discussed with: CRNA and Surgeon  Anesthesia Plan Comments: (Amisulpride preop)        Anesthesia Quick Evaluation

## 2019-08-14 NOTE — Anesthesia Procedure Notes (Signed)
Procedure Name: Intubation Date/Time: 08/14/2019 9:31 AM Performed by: Victoriano Lain, CRNA Pre-anesthesia Checklist: Patient identified, Emergency Drugs available, Suction available, Patient being monitored and Timeout performed Patient Re-evaluated:Patient Re-evaluated prior to induction Oxygen Delivery Method: Circle system utilized Preoxygenation: Pre-oxygenation with 100% oxygen Induction Type: IV induction Ventilation: Mask ventilation without difficulty and Oral airway inserted - appropriate to patient size Laryngoscope Size: Mac and 4 Grade View: Grade I Tube type: Oral Tube size: 7.5 mm Number of attempts: 1 Airway Equipment and Method: Stylet Placement Confirmation: ETT inserted through vocal cords under direct vision,  positive ETCO2 and breath sounds checked- equal and bilateral Secured at: 21 cm Tube secured with: Tape Dental Injury: Teeth and Oropharynx as per pre-operative assessment

## 2019-08-14 NOTE — Interval H&P Note (Signed)
History and Physical Interval Note:  08/14/2019 7:46 AM  [name]  has presented today for surgery, with Patricia diagnosis of ENDOMETRIAL CANCER.  Patricia various methods of treatment have been discussed with Patricia patient and family. After consideration of risks, benefits and other options for treatment, Patricia patient has consented to  Procedure(s): XI ROBOTIC ASSISTED TOTAL HYSTERECTOMY WITH BILATERAL SALPINGO OOPHORECTOMY (N/A) SENTINEL NODE BIOPSY (N/A) as a surgical intervention.  Patricia patient's history has been reviewed, patient examined, no change in status, stable for surgery.  I have reviewed Patricia patient's chart and labs.  Questions were answered to Patricia patient's satisfaction.     Thereasa Waters

## 2019-08-15 ENCOUNTER — Encounter (HOSPITAL_COMMUNITY): Payer: Self-pay | Admitting: Gynecologic Oncology

## 2019-08-15 ENCOUNTER — Other Ambulatory Visit: Payer: Self-pay | Admitting: Family Medicine

## 2019-08-15 ENCOUNTER — Telehealth: Payer: Self-pay | Admitting: Gynecologic Oncology

## 2019-08-15 DIAGNOSIS — Z0000 Encounter for general adult medical examination without abnormal findings: Secondary | ICD-10-CM

## 2019-08-15 NOTE — Telephone Encounter (Signed)
Post op telephone call to check patient status.  Patient describes expected post operative status.  Adequate PO intake reported.  Bowels and bladder functioning without difficulty.  Pain minimal.  Reportable signs and symptoms reviewed.  After hours number given. Advised to call for any needs or concerns.

## 2019-08-20 ENCOUNTER — Encounter: Payer: Self-pay | Admitting: Gynecologic Oncology

## 2019-08-20 NOTE — Telephone Encounter (Signed)
I responded to a [employer] from Serafina Topham who has a rash post-op. I put the recommendations in there to add an antihistamine but if you could call her and see if she can tolerate benadryl. A 50 mg dose would be recommended but if that makes her drowsy she can take 25 or can first take 25 and if she does ok then add the other. She can also take zyrtec or Claritin per Joylene John, NP.  Ms Antunes stated that she took a Claritin ~ 1030 this am.  It is not helping. She will get the benadryl as noted above. And take it. She will call back to the office if benadryl not effective or talk with Dr. [name] tomorrow afternoon at phone visit.

## 2019-08-21 ENCOUNTER — Inpatient Hospital Stay (HOSPITAL_BASED_OUTPATIENT_CLINIC_OR_DEPARTMENT_OTHER): Payer: 59 | Admitting: Gynecologic Oncology

## 2019-08-21 ENCOUNTER — Encounter: Payer: Self-pay | Admitting: Gynecologic Oncology

## 2019-08-21 ENCOUNTER — Other Ambulatory Visit: Payer: Self-pay

## 2019-08-21 DIAGNOSIS — Z7189 Other specified counseling: Secondary | ICD-10-CM

## 2019-08-21 DIAGNOSIS — Z90710 Acquired absence of both cervix and uterus: Secondary | ICD-10-CM

## 2019-08-21 DIAGNOSIS — Z90722 Acquired absence of ovaries, bilateral: Secondary | ICD-10-CM

## 2019-08-21 DIAGNOSIS — C541 Malignant neoplasm of endometrium: Secondary | ICD-10-CM

## 2019-08-21 NOTE — Progress Notes (Signed)
Gynecologic Oncology Telehealth Follow-up Note  I connected with Patricia Waters on 08/21/19 at  3:15 PM EDT by telephone and verified that I am speaking with the correct person using two identifiers.  I discussed the limitations, risks, security and privacy concerns of performing an evaluation and management service by telemedicine and the availability of in-person appointments. I also discussed with the patient that there may be a patient responsible charge related to this service. The patient expressed understanding and agreed to proceed.  Other persons participating in the visit and their role in the encounter: her partner, [name], listening and asking questions.  Patient's location: home Provider's location: [facility].   Chief Complaint:  Chief Complaint  Patient presents with  . endometrial cancer    counseling and coordination of care     Assessment/Plan:  Patricia Waters  is a 64 y.o.  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 I discussed that I favor this being stage IIIA disease, however we will discuss at tumor board.  If it is determined to be stage III disease, I recommend adjuvant chemotherapy with 6 cycles carb/taxol. I do not see a role for radiation given the lack of deep myo invasion or LVSI and small tumor focus.  If it is determined to be stage IA ovary, stage IA endometrial cancers (synchronous) she may be a candidate for close follow-up.  She will need evaluation by genetics given her history of multiple primary cancers.   HPI: Patricia Waters is a 64 year old P0 who was seen in consultation at the request of Dr Gala Romney for evaluation of grade 1 endometrial cancer.  The patient is a regular patient of Dr Roseanne Kaufman for gynecological care she experienced an episode of postmenopausal bleeding on July 06, 2019 and promptly notified her gynecologist who made an  appointment to see the patient.  An endometrial biopsy was performed on July 28, 2019 which revealed FIGO grade 1 endometrioid adenocarcinoma.  A CT abdomen and pelvis was subsequently performed on August 01, 2019 and showed no evidence of abdominal pelvic metastatic disease.  A small fibroid was seen in the anterior uterine corpus measuring 1.7 cm but the uterus was otherwise unremarkable.  The patient's medical history significant for morbid obesity with a BMI of 41 kg meters squared.  She is G0.  She has no history of abdominal surgery.  She has history of breast cancer, bilateral, estrogen receptor positive.  She did not receive tamoxifen therapy for this.  She received treatment with Dr. Gunnar Bulla Magrinat.  The patient has a strong medical history for malignancy including a mother who had endometrial cancer at age 51 and separate breast cancer diagnosis at age 72.  She has a sister with a history of breast cancer and herself had a history of breast cancer.  Her mother was screened as was her sister and the patient herself with genetic testing at the current health [place] in approximately 2017.  This genetic testing was negative for identifiable deleterious mutations.  The patient is in a same-sex relationship and married to her wife.  Her wife is a retired Therapist, sports.  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  Interval Hx:   On 08/14/19 she underwent robotic assisted TLH, BSO, SLN biopsy. Intraoperative findings were significant for no gross extrauterine disease. Surgery was uncomplicated.  Final pathology revealed an endometrioid endometrial adenocarcinoma, FIGO grade 1. There were 3  of 89mm (inner half invasion) myometrial invasion, no LVSI, no cervical or lymph node involvement. There was a focus of grade 1 endometrioid adenocarcinoma in the right ovary and she was staged as FIGO stage IIIA.  Since surgery she has done well with no complaints.   Current Meds:  Outpatient Encounter  Medications as of 08/21/2019  Medication Sig  . calcium carbonate (OS-CAL) 600 MG TABS Take 600 mg by mouth daily.    . cholecalciferol (VITAMIN D) 1000 UNITS tablet Take 1,000 Units by mouth daily.    . DULoxetine (CYMBALTA) 60 MG capsule Take 1 capsule (60 mg total) by mouth daily.  . fluticasone (FLONASE) 50 MCG/ACT nasal spray Place 2 sprays into both nostrils daily as needed for allergies or rhinitis.  Marland Kitchen gabapentin (NEURONTIN) 300 MG capsule Take 2 capsules (600 mg total) by mouth daily.  Marland Kitchen ibuprofen (ADVIL) 600 MG tablet Take 1 tablet (600 mg total) by mouth every 6 (six) hours as needed for moderate pain. For AFTER surgery  . Insulin Pen Needle (NOVOFINE) 32G X 6 MM MISC 1 EACH BY DOES NOT APPLY ROUTE DAILY. USE TO INJECT SAXENDA, ONCE DAILY.  Marland Kitchen Krill Oil 1000 MG CAPS Take 1,000 mg by mouth daily.   . Multiple Vitamins-Minerals (ICAPS AREDS FORMULA PO) Take 2 capsules by mouth daily.  Marland Kitchen oxyCODONE (OXY IR/ROXICODONE) 5 MG immediate release tablet Take 1 tablet (5 mg total) by mouth every 4 (four) hours as needed for severe pain. For AFTER surgery, do not take and drive  . rOPINIRole (REQUIP) 0.5 MG tablet TAKE 1 TABLET BY MOUTH AT  BEDTIME  . SAXENDA 18 MG/3ML SOPN INJECT 3 MG INTO THE SKIN DAILY. (Patient taking differently: Inject 3 mg into the skin daily. Last Dose on 08/07/2019 per pt)  . senna-docusate (SENOKOT-S) 8.6-50 MG tablet Take 2 tablets by mouth at bedtime. For AFTER surgery, do not take if having diarrhea  . vitamin C (ASCORBIC ACID) 500 MG tablet Take 500 mg by mouth daily.     No facility-administered encounter medications on file as of 08/21/2019.    Allergy: No Known Allergies  Social Hx:   Social History   Socioeconomic History  . Marital status: Married    Spouse name: [name]  . Number of children: Not on file  . Years of education: Not on file  . Highest education level: Not on file  Occupational History  . Occupation: Radio broadcast assistant: [employer]     Comment: [employer]  Tobacco Use  . Smoking status: Former Smoker    Types: Cigarettes    Quit date: 10/31/2006    Years since quitting: 12.8  . Smokeless tobacco: Never Used  Vaping Use  . Vaping Use: Never used  Substance and Sexual Activity  . Alcohol use: Not Currently  . Drug use: No  . Sexual activity: Not Currently    Partners: Female    Birth control/protection: None  Other Topics Concern  . Not on file  Social History Narrative   Lives with her partner, [name]. Former Therapist, nutritional.    Social Determinants of Health   Financial Resource Strain:   . Difficulty of Paying Living Expenses:   Food Insecurity:   . Worried About Charity fundraiser in the Last Year:   . Arboriculturist in the Last Year:   Transportation Needs:   . Film/video editor (Medical):   Marland Kitchen Lack of Transportation (Non-Medical):   Physical Activity:   . Days  of Exercise per Week:   . Minutes of Exercise per Session:   Stress:   . Feeling of Stress :   Social Connections:   . Frequency of Communication with Friends and Family:   . Frequency of Social Gatherings with Friends and Family:   . Attends Religious Services:   . Active Member of Clubs or Organizations:   . Attends Archivist Meetings:   Marland Kitchen Marital Status:   Intimate Partner Violence:   . Fear of Current or Ex-Partner:   . Emotionally Abused:   Marland Kitchen Physically Abused:   . Sexually Abused:     Past Surgical Hx:  Past Surgical History:  Procedure Laterality Date  . BACK SURGERY    . BREAST LUMPECTOMY  1994   right  . CERVICAL FUSION  1995   C5-C7  . MASTECTOMY  2005   Bilat   . ROBOTIC ASSISTED TOTAL HYSTERECTOMY WITH BILATERAL SALPINGO OOPHERECTOMY N/A 08/14/2019   Procedure: XI ROBOTIC ASSISTED TOTAL HYSTERECTOMY WITH BILATERAL SALPINGO OOPHORECTOMY;  Surgeon: [name], MD;  Location: [facility];  Service: Gynecology;  Laterality: N/A;  . SENTINEL NODE BIOPSY N/A 08/14/2019   Procedure: SENTINEL NODE BIOPSY;  Surgeon:  [name], MD;  Location: [facility];  Service: Gynecology;  Laterality: N/A;    Past Medical Hx:  Past Medical History:  Diagnosis Date  . Arthritis   . Breast cancer ([place])    at age 69  . History of radiation therapy   . Lymph edema    right arm   . PONV (postoperative nausea and vomiting)   . Pre-diabetes   . Sleep apnea    pt has nose pillows for cpap per pt - will bring DOS   . Vertigo     Past Gynecological History:  See HPI No LMP recorded. Patient is postmenopausal.  Family Hx:  Family History  Problem Relation Age of Onset  . Stroke Mother   . Hyperlipidemia Mother   . Skin cancer Mother   . Breast cancer Mother   . Endometrial cancer Mother   . Diabetes Father   . Hyperlipidemia Father   . Heart attack Father 23  . Colon cancer Father   . Cancer Father 98       prostate  . Diabetes Paternal Grandmother   . Heart attack Paternal Grandmother   . Rheum arthritis Paternal Grandmother   . Hyperlipidemia Sister   . Heart attack Paternal Grandfather   . Heart attack Maternal Grandfather   . Heart attack Maternal Grandmother   . Skin cancer Maternal Grandmother   . Rheum arthritis Maternal Grandmother   . Breast cancer Sister   . Lymphoma Maternal Aunt   . Kidney cancer Maternal Aunt     Review of Systems:  Constitutional  Feels well,   ENT Normal appearing ears and nares bilaterally Skin/Breast  No rash, sores, jaundice, itching, dryness Cardiovascular  No chest pain, shortness of breath, or edema  Pulmonary  No cough or wheeze.  Gastro Intestinal  No nausea, vomitting, or diarrhoea. No bright red blood per rectum, no abdominal pain, change in bowel movement, or constipation.  Genito Urinary  No frequency, urgency, dysuria, + postmenopausal bleeding Musculo Skeletal  No myalgia, arthralgia, joint swelling or pain  Neurologic  No weakness, numbness, change in gait,  Psychology  No depression, anxiety, insomnia.   Vitals:  There were no vitals  taken for this visit.  Physical Exam: Deferred.  I discussed the assessment and treatment plan with  the patient. The patient was provided with an opportunity to ask questions and all were answered. The patient agreed with the plan and demonstrated an understanding of the instructions.   The patient was advised to call back or see an in-person evaluation if the symptoms worsen or if the condition fails to improve as anticipated.   I provided 20 minutes of non face to face phone time during this encounter, and > 50% was spent counseling as documented under my assessment & plan.    Thereasa Solo, MD  08/21/2019, 3:53 PM

## 2019-08-25 ENCOUNTER — Telehealth: Payer: Self-pay | Admitting: Oncology

## 2019-08-25 ENCOUNTER — Other Ambulatory Visit: Payer: Self-pay | Admitting: Oncology

## 2019-08-25 DIAGNOSIS — C541 Malignant neoplasm of endometrium: Secondary | ICD-10-CM

## 2019-08-25 NOTE — Progress Notes (Signed)
Gynecologic Oncology Multi-Disciplinary Disposition Conference Note  Date of the Conference: 08/25/2019  Patient Name: Patricia Waters  Referring Provider: Dr. Gala Romney Primary GYN Oncologist: Dr. [name]  Stage/Disposition:  Stage IIIA, grade 1 endometrioid endometrial cancer. Disposition is to 6 cycles of carboplatin/Taxol.  Also referral for genetic counseling.   This Multidisciplinary conference took place involving physicians from [place], [place], Radiation Oncology, Pathology, Radiology along with the Gynecologic Oncology Nurse Practitioner and RN.  Comprehensive assessment of the patient's malignancy, staging, need for surgery, chemotherapy, radiation therapy, and need for further testing were reviewed. Supportive measures, both inpatient and following discharge were also discussed. The recommended plan of care is documented. Greater than 35 minutes were spent correlating and coordinating this patient's care.

## 2019-08-25 NOTE — Telephone Encounter (Signed)
Called Patricia Waters and advised her of GYN Cancer Conference recommendations. Scheduled new patient appointment with Dr. [name] on 09/04/19 at 8:00.  Advised her to arrive at least 15 minutes early to register and to bring a friend/family member with her.  Also scheduled genetic counseling appointment for 09/04/19 at 11:00.

## 2019-08-26 LAB — SURGICAL PATHOLOGY

## 2019-08-27 ENCOUNTER — Telehealth: Payer: Self-pay

## 2019-08-27 NOTE — Telephone Encounter (Signed)
OK to stop. Hopefully she is doing OK

## 2019-08-27 NOTE — Telephone Encounter (Signed)
Task completed. Pt has been updated of provider's note. No other inquiries during the call.

## 2019-08-27 NOTE — Telephone Encounter (Signed)
Patricia Waters called and left a message stating she has endometrial cancer. She wanted to know if she could just stop the Saxenda. Please advise.

## 2019-09-03 ENCOUNTER — Encounter: Payer: Self-pay | Admitting: Hematology and Oncology

## 2019-09-04 ENCOUNTER — Encounter: Payer: Self-pay | Admitting: Hematology and Oncology

## 2019-09-04 ENCOUNTER — Encounter: Payer: Self-pay | Admitting: Oncology

## 2019-09-04 ENCOUNTER — Inpatient Hospital Stay: Payer: 59 | Admitting: Hematology and Oncology

## 2019-09-04 ENCOUNTER — Other Ambulatory Visit: Payer: Self-pay

## 2019-09-04 ENCOUNTER — Inpatient Hospital Stay: Payer: 59 | Attending: Gynecologic Oncology | Admitting: Genetic Counselor

## 2019-09-04 VITALS — BP 119/72 | HR 68 | Temp 98.4°F | Resp 20 | Wt 254.2 lb

## 2019-09-04 DIAGNOSIS — Z800 Family history of malignant neoplasm of digestive organs: Secondary | ICD-10-CM | POA: Diagnosis not present

## 2019-09-04 DIAGNOSIS — Z90710 Acquired absence of both cervix and uterus: Secondary | ICD-10-CM | POA: Diagnosis not present

## 2019-09-04 DIAGNOSIS — Z86000 Personal history of in-situ neoplasm of breast: Secondary | ICD-10-CM

## 2019-09-04 DIAGNOSIS — Z853 Personal history of malignant neoplasm of breast: Secondary | ICD-10-CM

## 2019-09-04 DIAGNOSIS — G8929 Other chronic pain: Secondary | ICD-10-CM | POA: Insufficient documentation

## 2019-09-04 DIAGNOSIS — Z803 Family history of malignant neoplasm of breast: Secondary | ICD-10-CM

## 2019-09-04 DIAGNOSIS — Z79899 Other long term (current) drug therapy: Secondary | ICD-10-CM | POA: Diagnosis not present

## 2019-09-04 DIAGNOSIS — Z6841 Body Mass Index (BMI) 40.0 and over, adult: Secondary | ICD-10-CM | POA: Insufficient documentation

## 2019-09-04 DIAGNOSIS — M549 Dorsalgia, unspecified: Secondary | ICD-10-CM | POA: Insufficient documentation

## 2019-09-04 DIAGNOSIS — Z7952 Long term (current) use of systemic steroids: Secondary | ICD-10-CM | POA: Insufficient documentation

## 2019-09-04 DIAGNOSIS — Z90722 Acquired absence of ovaries, bilateral: Secondary | ICD-10-CM | POA: Diagnosis not present

## 2019-09-04 DIAGNOSIS — E669 Obesity, unspecified: Secondary | ICD-10-CM | POA: Diagnosis not present

## 2019-09-04 DIAGNOSIS — Z87891 Personal history of nicotine dependence: Secondary | ICD-10-CM | POA: Diagnosis not present

## 2019-09-04 DIAGNOSIS — I890 Lymphedema, not elsewhere classified: Secondary | ICD-10-CM | POA: Insufficient documentation

## 2019-09-04 DIAGNOSIS — C541 Malignant neoplasm of endometrium: Secondary | ICD-10-CM

## 2019-09-04 DIAGNOSIS — Z8042 Family history of malignant neoplasm of prostate: Secondary | ICD-10-CM

## 2019-09-04 DIAGNOSIS — Z8049 Family history of malignant neoplasm of other genital organs: Secondary | ICD-10-CM

## 2019-09-04 MED ORDER — PROCHLORPERAZINE MALEATE 10 MG PO TABS: 10.0000 mg | ORAL_TABLET | Freq: Four times a day (QID) | ORAL | 1 refills | Status: DC | PRN

## 2019-09-04 MED ORDER — DEXAMETHASONE 4 MG PO TABS
ORAL_TABLET | ORAL | 6 refills | Status: DC
Start: 2019-09-04 — End: 2020-01-20

## 2019-09-04 MED ORDER — LIDOCAINE-PRILOCAINE 2.5-2.5 % EX CREA: TOPICAL_CREAM | CUTANEOUS | 3 refills | Status: DC

## 2019-09-04 MED ORDER — ONDANSETRON HCL 8 MG PO TABS: 8.0000 mg | ORAL_TABLET | Freq: Three times a day (TID) | ORAL | 1 refills | Status: DC | PRN

## 2019-09-04 NOTE — Assessment & Plan Note (Signed)
We have extensive discussions about the importance of weight loss, due to high risk of cancer recurrence in association between obesity and risk of breast cancer & uterine cancer She is highly motivated We discussed the importance of setting a goal and some lifestyle intervention to help her achieve her goal  As above, I plan to adjust her dose of treatment upfront

## 2019-09-04 NOTE — Progress Notes (Signed)
DISCONTINUE ON PATHWAY REGIMEN - Uterine  No Medical Intervention - Off Treatment.  REASON: Other Reason PRIOR TREATMENT: Uterine157: Referral to Surgery  START ON PATHWAY REGIMEN - Uterine     A cycle is every 21 days:     Paclitaxel      Carboplatin   **Always confirm dose/schedule in your pharmacy ordering system**  Patient Characteristics: Endometrioid, Newly Diagnosed, Postoperative (Pathologic Staging), Stage IIIA - Grade 1, 2, or 3 Histology: Endometrioid Therapeutic Status: Newly Diagnosed, Postoperative (Pathologic Staging) AJCC T Category: pT3 AJCC N Category: pN0 AJCC 8 Stage Grouping: Unknown AJCC M Category: cM0 Intent of Therapy: Curative Intent, Discussed with Patient

## 2019-09-04 NOTE — Assessment & Plan Note (Signed)
She is scheduled to visit with genetic counselor again

## 2019-09-04 NOTE — Progress Notes (Signed)
[place] NOTE  Patient Care Team: Hali Marry, MD as PCP - General (Family Medicine) Druscilla Brownie, MD (Dermatology) Kristeen Miss, MD (Neurosurgery) Guss Bunde, MD (Obstetrics and Gynecology) Awanda Mink Craige Cotta, RN as Oncology Nurse Navigator (Oncology)  ASSESSMENT & PLAN:  Endometrial cancer [facility]) We reviewed the NCCN guidelines We discussed the role of chemotherapy. The intent is of curative intent.  We discussed some of the risks, benefits, side-effects of carboplatin & Taxol. Treatment is intravenous, every 3 weeks x 6 cycles  Some of the short term side-effects included, though not limited to, including weight loss, life threatening infections, risk of allergic reactions, need for transfusions of blood products, nausea, vomiting, change in bowel habits, loss of hair, admission to hospital for various reasons, and risks of death.   Long term side-effects are also discussed including risks of infertility, permanent damage to nerve function, hearing loss, chronic fatigue, kidney damage with possibility needing hemodialysis, and rare secondary malignancy including bone marrow disorders.  The patient is aware that the response rates discussed earlier is not guaranteed.  After a long discussion, patient made an informed decision to proceed with the prescribed plan of care.   Patient education material was dispensed. We discussed premedication with dexamethasone before chemotherapy. I recommend port placement due to history of breast cancer and lymph node biopsy/removal causing chronic lymphedema I recommend port placement on the left side if possible I will schedule chemo education class I am hopeful we can get her started on first dose of treatment end of next week I will see her back prior to cycle 2 of therapy for toxicity review  Due to high body mass index, I plan to cap maximum dose of carboplatin at 750 mg I also plan upfront dose  adjustment of Taxol due to increased risk of neuropathy  BMI 40.0-44.9, adult (HCC) We have extensive discussions about the importance of weight loss, due to high risk of cancer recurrence in association between obesity and risk of breast cancer & uterine cancer She is highly motivated We discussed the importance of setting a goal and some lifestyle intervention to help her achieve her goal  As above, I plan to adjust her dose of treatment upfront  History of ductal carcinoma in situ (DCIS) of breast She is scheduled to visit with genetic counselor again   Orders Placed This Encounter  Procedures  . IR IMAGING GUIDED PORT INSERTION    Standing Status:   Future    Standing Expiration Date:   09/03/2020    Order Specific Question:   Reason for Exam (SYMPTOM  OR DIAGNOSIS REQUIRED)    Answer:   need port to start chemo on 8/13    Order Specific Question:   Preferred Imaging Location?    Answer:   [facility]  . CBC with Differential ([place] Only)    Standing Status:   Standing    Number of Occurrences:   20    Standing Expiration Date:   09/03/2020  . CMP ([place] only)    Standing Status:   Standing    Number of Occurrences:   20    Standing Expiration Date:   09/03/2020    The total time spent in the appointment was 80 minutes encounter with patients including review of chart and various tests results, discussions about plan of care and coordination of care plan   All questions were answered. The patient knows to call the clinic with any problems,  questions or concerns. No barriers to learning was detected.  [name], MD 8/5/202110:52 AM  CHIEF COMPLAINTS/PURPOSE OF CONSULTATION:  Uterine cancer, history of breast cancer, strong family history of cancer, for adjuvant treatment  HISTORY OF PRESENTING ILLNESS:  Patricia Waters 64 y.o. female is here because of recent diagnosis of uterine cancer She is here accompanied by her wife, [name] The patient have  remote history of breast cancer on the right side diagnosed around 1994 She had lumpectomy, lymph node dissection along with adjuvant radiation therapy She was only 37 at the time of diagnosis She has invasive stage I breast cancer She did not receive adjuvant chemotherapy or antiestrogen therapy at the time She had subsequent recurrence around 2005 on the left side with DCIS and she had bilateral mastectomy with no further adjuvant treatment since then She had postmenopausal bleeding in June and underwent endometrial biopsy that confirmed diagnosis of endometrioid cancer She subsequently undergo surgery and is referred here because of stage III disease with findings of abnormal cells in the ovary (concurrent ovarian cancer less likely), and the recommendation is to proceed with adjuvant chemotherapy  I have reviewed her chart and materials related to her cancer extensively and collaborated history with the patient. Summary of oncologic history is as follows: Oncology History Overview Note  MSI Stable   Endometrial cancer ([place])  07/28/2019 Pathology Results   Endometrium, biopsy - ENDOMETRIOID CARCINOMA - SEE COMMENT Microscopic Comment Based on the biopsy, the carcinoma appears FIGO grade 1.   08/01/2019 Imaging   CT abdomen and pelvis No evidence of abdominal or pelvic metastatic disease.   Small uterine fibroid.   Colonic diverticulosis, without radiographic evidence of diverticulitis.   Aortic Atherosclerosis (ICD10-I70.0).   08/14/2019 Surgery   Surgeon: Donaciano Eva  Operation: Robotic-assisted laparoscopic total hysterectomy with bilateral salpingoophorectomy, SLN biopsy     Operative Findings:  : 8cm uterus which appeared grossly normal, normal appearing tubes and ovaries, no suspicious lymph nodes. Brisk oozing from skin incisions.    09/03/2019 Cancer Staging   Staging form: Corpus Uteri - Carcinoma and Carcinosarcoma, AJCC 8th Edition - Pathologic stage from  09/03/2019: FIGO Stage IIIA (pT3a, pN0, cM0) - Signed by [name], MD on 09/03/2019   09/04/2019 Pathology Results   FINAL MICROSCOPIC DIAGNOSIS:   A. LYMPH NODE, SENTINEL RIGHT OBTURATOR PROXIMAL, BIOPSY:  -  No carcinoma identified in one lymph node (0/1)  -  See comment   B. LYMPH NODE, SENTINEL RIGHT OBTURATOR DISTAL, BIOPSY:  -  No carcinoma identified in one lymph node (0/1)  -  See comment   C. LYMPH NODE, SENTINEL LEFT EXTERNAL ILIAC, BIOPSY:  -  No carcinoma identified in one lymph node (0/1)  -  See comment   D. UTERUS AND BILATERAL ADNEXA, HYSTERECTOMY WITH SALPINGO-OOPHORECTOMY:   Uterus:  -  Endometrioid carcinoma, FIGO grade 1  -  Leiomyoma (1.8 cm; largest)  -  See oncology table and comment below   Cervix:  -  Cervix with squamous metaplasia  -  No carcinoma identified   Bilateral Ovaries:  -  Endometrioid carcinoma involving right ovary  -  No carcinoma involving left ovary  -  Endosalpingiosis   Bilateral Fallopian tubes:  -  No carcinoma identified   ONCOLOGY TABLE:   UTERUS, CARCINOMA OR CARCINOSARCOMA   Procedure: Total hysterectomy and bilateral salpingo-oophorectomy  Histologic type: Endometrioid carcinoma, NOS  Histologic Grade: FIGO grade 1  Myometrial invasion:       Depth of  invasion: 3 mm       Myometrial thickness: 20 mm  Uterine Serosa Involvement: Not identified  Cervical stromal involvement: Not identified  Extent of involvement of other organs: Right ovary  Lymphovascular invasion: Not identified  Regional Lymph Nodes:       Examined:      3 Sentinel                               0 non-sentinel                               3 total        Lymph nodes with metastasis: 0        Isolated tumor cells (<0.2 mm): 0        Micrometastasis:  (>0.2 mm and < 2.0 mm): 0        Macrometastasis: (>2.0 mm): 0        Extracapsular extension: N/A  Representative Tumor Block: D10  MMR / MSI testing: Pending  Pathologic Stage Classification  (pTNM, AJCC 8th edition):  pT3a, pN0  Comments: Pancytokeratin performed on the lymph nodes is negative. The right ovary has a focus of carcinoma.      She is doing very well postoperatively She takes Cymbalta and gabapentin for chronic back pain which is well controlled The patient has attempted to lose weight with Saxenda for almost 2 years without success  MEDICAL HISTORY:  Past Medical History:  Diagnosis Date  . Arthritis   . Breast cancer ([place])    at age 68  . History of radiation therapy   . Lymph edema    right arm   . PONV (postoperative nausea and vomiting)   . Pre-diabetes   . Sleep apnea    pt has nose pillows for cpap per pt - will bring DOS   . Vertigo     SURGICAL HISTORY: Past Surgical History:  Procedure Laterality Date  . BACK SURGERY    . BREAST LUMPECTOMY  1994   right  . CERVICAL FUSION  1995   C5-C7  . MASTECTOMY  2005   Bilat   . ROBOTIC ASSISTED TOTAL HYSTERECTOMY WITH BILATERAL SALPINGO OOPHERECTOMY N/A 08/14/2019   Procedure: XI ROBOTIC ASSISTED TOTAL HYSTERECTOMY WITH BILATERAL SALPINGO OOPHORECTOMY;  Surgeon: [name], MD;  Location: [facility];  Service: Gynecology;  Laterality: N/A;  . SENTINEL NODE BIOPSY N/A 08/14/2019   Procedure: SENTINEL NODE BIOPSY;  Surgeon: [name], MD;  Location: [facility];  Service: Gynecology;  Laterality: N/A;    SOCIAL HISTORY: Social History   Socioeconomic History  . Marital status: Married    Spouse name: [name]  . Number of children: Not on file  . Years of education: Not on file  . Highest education level: Not on file  Occupational History  . Occupation: Radio broadcast assistant: [employer]    Comment: [employer]  Tobacco Use  . Smoking status: Former Smoker    Types: Cigarettes    Quit date: 10/31/2006    Years since quitting: 12.8  . Smokeless tobacco: Never Used  Vaping Use  . Vaping Use: Never used  Substance and Sexual Activity  . Alcohol use: Not Currently  . Drug use: No  . Sexual activity:  Not Currently    Partners: Female    Birth control/protection: None  Other Topics Concern  . Not  on file  Social History Narrative   Lives with her partner, [name]. Former Therapist, nutritional.    Social Determinants of Health   Financial Resource Strain:   . Difficulty of Paying Living Expenses:   Food Insecurity:   . Worried About Charity fundraiser in the Last Year:   . Arboriculturist in the Last Year:   Transportation Needs:   . Film/video editor (Medical):   Marland Kitchen Lack of Transportation (Non-Medical):   Physical Activity:   . Days of Exercise per Week:   . Minutes of Exercise per Session:   Stress:   . Feeling of Stress :   Social Connections:   . Frequency of Communication with Friends and Family:   . Frequency of Social Gatherings with Friends and Family:   . Attends Religious Services:   . Active Member of Clubs or Organizations:   . Attends Archivist Meetings:   Marland Kitchen Marital Status:   Intimate Partner Violence:   . Fear of Current or Ex-Partner:   . Emotionally Abused:   Marland Kitchen Physically Abused:   . Sexually Abused:     FAMILY HISTORY: Family History  Problem Relation Age of Onset  . Stroke Mother   . Hyperlipidemia Mother   . Skin cancer Mother   . Breast cancer Mother   . Endometrial cancer Mother   . Diabetes Father   . Hyperlipidemia Father   . Heart attack Father 81  . Colon cancer Father   . Cancer Father 71       prostate  . Diabetes Paternal Grandmother   . Heart attack Paternal Grandmother   . Rheum arthritis Paternal Grandmother   . Hyperlipidemia Sister   . Heart attack Paternal Grandfather   . Heart attack Maternal Grandfather   . Heart attack Maternal Grandmother   . Skin cancer Maternal Grandmother   . Rheum arthritis Maternal Grandmother   . Breast cancer Sister   . Lymphoma Maternal Aunt   . Kidney cancer Maternal Aunt     ALLERGIES:  has No Known Allergies.  MEDICATIONS:  Current Outpatient Medications  Medication Sig  Dispense Refill  . calcium carbonate (OS-CAL) 600 MG TABS Take 600 mg by mouth daily.      . cholecalciferol (VITAMIN D) 1000 UNITS tablet Take 1,000 Units by mouth daily.      Marland Kitchen dexamethasone (DECADRON) 4 MG tablet Take 2 tabs at the night before and 2 tab the morning of chemotherapy, every 3 weeks, by mouth x 6 cycles 36 tablet 6  . DULoxetine (CYMBALTA) 60 MG capsule Take 1 capsule (60 mg total) by mouth daily. 90 capsule 1  . fluticasone (FLONASE) 50 MCG/ACT nasal spray Place 2 sprays into both nostrils daily as needed for allergies or rhinitis.    Marland Kitchen gabapentin (NEURONTIN) 300 MG capsule Take 2 capsules (600 mg total) by mouth daily. 180 capsule 2  . lidocaine-prilocaine (EMLA) cream Apply to affected area once 30 g 3  . ondansetron (ZOFRAN) 8 MG tablet Take 1 tablet (8 mg total) by mouth every 8 (eight) hours as needed. Start on the third day after chemotherapy. 30 tablet 1  . prochlorperazine (COMPAZINE) 10 MG tablet Take 1 tablet (10 mg total) by mouth every 6 (six) hours as needed (Nausea or vomiting). 30 tablet 1  . rOPINIRole (REQUIP) 0.5 MG tablet TAKE 1 TABLET BY MOUTH AT  BEDTIME 90 tablet 3  . senna-docusate (SENOKOT-S) 8.6-50 MG tablet Take 2 tablets by mouth at  bedtime. For AFTER surgery, do not take if having diarrhea 30 tablet 0  . vitamin C (ASCORBIC ACID) 500 MG tablet Take 500 mg by mouth daily.       No current facility-administered medications for this visit.    REVIEW OF SYSTEMS:   Constitutional: Denies fevers, chills or abnormal night sweats Eyes: Denies blurriness of vision, double vision or watery eyes Ears, nose, mouth, throat, and face: Denies mucositis or sore throat Respiratory: Denies cough, dyspnea or wheezes Cardiovascular: Denies palpitation, chest discomfort or lower extremity swelling Gastrointestinal:  Denies nausea, heartburn or change in bowel habits Skin: Denies abnormal skin rashes Lymphatics: Denies new lymphadenopathy or easy  bruising Neurological:Denies numbness, tingling or new weaknesses Behavioral/Psych: Mood is stable, no new changes  All other systems were reviewed with the patient and are negative.  PHYSICAL EXAMINATION: ECOG PERFORMANCE STATUS: 0 - Asymptomatic  Vitals:   09/04/19 0805  BP: 119/72  Pulse: 68  Resp: 20  Temp: 98.4 F (36.9 C)  SpO2: 97%   Filed Weights   09/04/19 0805  Weight: 254 lb 3.2 oz (115.3 kg)    GENERAL:alert, no distress and comfortable SKIN: skin color, texture, turgor are normal, no rashes or significant lesions EYES: normal, conjunctiva are pink and non-injected, sclera clear OROPHARYNX:no exudate, no erythema and lips, buccal mucosa, and tongue normal  NECK: supple, thyroid normal size, non-tender, without nodularity LYMPH:  no palpable lymphadenopathy in the cervical, axillary or inguinal LUNGS: clear to auscultation and percussion with normal breathing effort HEART: regular rate & rhythm and no murmurs and no lower extremity edema ABDOMEN:abdomen soft, non-tender and normal bowel sounds.  Well-healed surgical scar Musculoskeletal:no cyanosis of digits and no clubbing  PSYCH: alert & oriented x 3 with fluent speech NEURO: no focal motor/sensory deficits  LABORATORY DATA:  I have reviewed the data as listed Lab Results  Component Value Date   WBC 9.3 08/11/2019   HGB 13.4 08/11/2019   HCT 42.3 08/11/2019   MCV 100.5 (H) 08/11/2019   PLT 314 08/11/2019   Recent Labs    03/24/19 1041 07/31/19 0000 08/11/19 1118  NA 138  --  143  K 4.6  --  4.3  CL 102  --  106  CO2 30  --  28  GLUCOSE 95  --  98  BUN '9 11 10  ' CREATININE 0.78 0.90 0.74  CALCIUM 9.7  --  9.3  GFRNONAA 81 68 >60  GFRAA 94 79 >60  PROT 7.1  --  7.9  ALBUMIN  --   --  4.3  AST 22  --  33  ALT 21  --  31  ALKPHOS  --   --  114  BILITOT 0.4  --  0.8

## 2019-09-04 NOTE — Progress Notes (Signed)
Met with Rokhaya and gave her the [employer].  Advised her to call with any questions or needs.

## 2019-09-04 NOTE — Progress Notes (Signed)
Uterine - No Medical Intervention - Off Treatment.  Patient Characteristics: Endometrioid, Newly Diagnosed (Clinical Staging), Surgical Candidate Histology: Endometrioid Therapeutic Status: Newly Diagnosed (Clinical Staging) AJCC T Category: cTX Surgical Candidacy: Surgical Candidate AJCC N Category: cNX AJCC 8 Stage Grouping: IIIA AJCC M Category: cM0

## 2019-09-04 NOTE — Assessment & Plan Note (Signed)
We reviewed the NCCN guidelines We discussed the role of chemotherapy. The intent is of curative intent.  We discussed some of the risks, benefits, side-effects of carboplatin & Taxol. Treatment is intravenous, every 3 weeks x 6 cycles  Some of the short term side-effects included, though not limited to, including weight loss, life threatening infections, risk of allergic reactions, need for transfusions of blood products, nausea, vomiting, change in bowel habits, loss of hair, admission to hospital for various reasons, and risks of death.   Long term side-effects are also discussed including risks of infertility, permanent damage to nerve function, hearing loss, chronic fatigue, kidney damage with possibility needing hemodialysis, and rare secondary malignancy including bone marrow disorders.  The patient is aware that the response rates discussed earlier is not guaranteed.  After a long discussion, patient made an informed decision to proceed with the prescribed plan of care.   Patient education material was dispensed. We discussed premedication with dexamethasone before chemotherapy. I recommend port placement due to history of breast cancer and lymph node biopsy/removal causing chronic lymphedema I recommend port placement on the left side if possible I will schedule chemo education class I am hopeful we can get her started on first dose of treatment end of next week I will see her back prior to cycle 2 of therapy for toxicity review  Due to high body mass index, I plan to cap maximum dose of carboplatin at 750 mg I also plan upfront dose adjustment of Taxol due to increased risk of neuropathy

## 2019-09-05 ENCOUNTER — Encounter (HOSPITAL_COMMUNITY): Payer: Self-pay | Admitting: Gynecologic Oncology

## 2019-09-05 ENCOUNTER — Encounter: Payer: Self-pay | Admitting: Gynecologic Oncology

## 2019-09-05 ENCOUNTER — Other Ambulatory Visit: Payer: Self-pay

## 2019-09-05 ENCOUNTER — Encounter: Payer: Self-pay | Admitting: Genetic Counselor

## 2019-09-05 ENCOUNTER — Inpatient Hospital Stay (HOSPITAL_BASED_OUTPATIENT_CLINIC_OR_DEPARTMENT_OTHER): Payer: 59 | Admitting: Gynecologic Oncology

## 2019-09-05 VITALS — BP 125/64 | HR 69 | Temp 98.4°F | Resp 18 | Ht 65.0 in | Wt 255.6 lb

## 2019-09-05 DIAGNOSIS — Z8042 Family history of malignant neoplasm of prostate: Secondary | ICD-10-CM | POA: Insufficient documentation

## 2019-09-05 DIAGNOSIS — C541 Malignant neoplasm of endometrium: Secondary | ICD-10-CM | POA: Diagnosis not present

## 2019-09-05 DIAGNOSIS — Z803 Family history of malignant neoplasm of breast: Secondary | ICD-10-CM | POA: Insufficient documentation

## 2019-09-05 DIAGNOSIS — Z800 Family history of malignant neoplasm of digestive organs: Secondary | ICD-10-CM | POA: Insufficient documentation

## 2019-09-05 DIAGNOSIS — Z8049 Family history of malignant neoplasm of other genital organs: Secondary | ICD-10-CM | POA: Insufficient documentation

## 2019-09-05 DIAGNOSIS — Z90710 Acquired absence of both cervix and uterus: Secondary | ICD-10-CM

## 2019-09-05 DIAGNOSIS — Z90722 Acquired absence of ovaries, bilateral: Secondary | ICD-10-CM

## 2019-09-05 NOTE — Patient Instructions (Signed)
Dr [name] is clearing you to start chemotherapy as scheduled. Please avoid tub baths or swimming until you no longer have scabs on the skin.  Dr [name] will see you for follow-up after completing chemotherapy.

## 2019-09-05 NOTE — Progress Notes (Signed)
REFERRING PROVIDER: Dorothyann Gibbs, NP [place],  [place] [address]  PRIMARY PROVIDER:  Hali Marry, MD  PRIMARY REASON FOR VISIT:  1. Endometrial cancer ([facility])   2. History of breast cancer in female   3. Family history of breast cancer   4. Family history of colon cancer   5. Family history of uterine cancer   6. Family history of prostate cancer in father    HISTORY OF PRESENT ILLNESS:   Patricia Waters, a 64 y.o. female, was seen for a [facility] cancer genetics consultation at the request of Joylene John, NP due to a personal history of bilateral breast cancer and endometrial cancer and a family history of breast, endometrial, colon, and prostate cancers.  Patricia Waters presents to clinic today, with her wife [name], to discuss the possibility of a hereditary predisposition to cancer, to discuss genetic testing, and to further clarify her future cancer risks, as well as potential cancer risks for family members.   In 1994, at the age of 38, Patricia Waters was diagnosed with right breast cancer. The treatment plan included lumpectomy and radiation.  In 2005, at the age of 25, Patricia Waters was diagnosed with cancer of the left breast.  The treatment plan included bilateral mastectomy.  In 2021, at the age of 19, Patricia Waters was diagnosed with endometrial cancer.  Pathology showed normal IHC expression.  She received a total hysterectomy with bilateral salpingooophorectomy.   CANCER HISTORY:  Oncology History Overview Note  [facility] Stable   Endometrial cancer ([facility])  07/28/2019 Pathology Results   Endometrium, biopsy - ENDOMETRIOID CARCINOMA - SEE COMMENT Microscopic Comment Based on the biopsy, the carcinoma appears FIGO grade 1.   08/01/2019 Imaging   CT abdomen and pelvis No evidence of abdominal or pelvic metastatic disease.   Small uterine fibroid.   Colonic diverticulosis, without radiographic evidence of diverticulitis.   Aortic Atherosclerosis (ICD10-I70.0).     08/14/2019 Surgery   Surgeon: Donaciano Eva  Operation: Robotic-assisted laparoscopic total hysterectomy with bilateral salpingoophorectomy, SLN biopsy     Operative Findings:  : 8cm uterus which appeared grossly normal, normal appearing tubes and ovaries, no suspicious lymph nodes. Brisk oozing from skin incisions.    09/03/2019 Cancer Staging   Staging form: Corpus Uteri - Carcinoma and Carcinosarcoma, AJCC 8th Edition - Pathologic stage from 09/03/2019: FIGO Stage IIIA (pT3a, pN0, cM0) - Signed by [name], MD on 09/03/2019   09/04/2019 Pathology Results   FINAL MICROSCOPIC DIAGNOSIS:   A. LYMPH NODE, SENTINEL RIGHT OBTURATOR PROXIMAL, BIOPSY:  -  No carcinoma identified in one lymph node (0/1)  -  See comment   B. LYMPH NODE, SENTINEL RIGHT OBTURATOR DISTAL, BIOPSY:  -  No carcinoma identified in one lymph node (0/1)  -  See comment   C. LYMPH NODE, SENTINEL LEFT EXTERNAL ILIAC, BIOPSY:  -  No carcinoma identified in one lymph node (0/1)  -  See comment   D. UTERUS AND BILATERAL ADNEXA, HYSTERECTOMY WITH SALPINGO-OOPHORECTOMY:   Uterus:  -  Endometrioid carcinoma, FIGO grade 1  -  Leiomyoma (1.8 cm; largest)  -  See oncology table and comment below   Cervix:  -  Cervix with squamous metaplasia  -  No carcinoma identified   Bilateral Ovaries:  -  Endometrioid carcinoma involving right ovary  -  No carcinoma involving left ovary  -  Endosalpingiosis   Bilateral Fallopian tubes:  -  No carcinoma identified   ONCOLOGY TABLE:  UTERUS, CARCINOMA OR CARCINOSARCOMA   Procedure: Total hysterectomy and bilateral salpingo-oophorectomy  Histologic type: Endometrioid carcinoma, NOS  Histologic Grade: FIGO grade 1  Myometrial invasion:       Depth of invasion: 3 mm       Myometrial thickness: 20 mm  Uterine Serosa Involvement: Not identified  Cervical stromal involvement: Not identified  Extent of involvement of other organs: Right ovary  Lymphovascular invasion:  Not identified  Regional Lymph Nodes:       Examined:      3 Sentinel                               0 non-sentinel                               3 total        Lymph nodes with metastasis: 0        Isolated tumor cells (<0.2 mm): 0        Micrometastasis:  (>0.2 mm and < 2.0 mm): 0        Macrometastasis: (>2.0 mm): 0        Extracapsular extension: N/A  Representative Tumor Block: D10  MMR / MSI testing: Pending  Pathologic Stage Classification (pTNM, AJCC 8th edition):  pT3a, pN0  Comments: Pancytokeratin performed on the lymph nodes is negative. The right ovary has a focus of carcinoma.        RISK FACTORS:  Menarche was at age 15.  Nulliparous.  OCP use for approximately 0 years.  Ovaries intact: no.  Hysterectomy: yes.  Menopausal status: postmenopausal; menopause in approximately 2008 HRT use: 3 years of progesterone use Colonoscopy: yes; most recent in 2019; colon polyp removed in 2014.  Past Medical History:  Diagnosis Date  . Arthritis   . Breast cancer ([facility])    at age 48  . Family history of breast cancer   . Family history of colon cancer   . Family history of prostate cancer in father   . Family history of uterine cancer   . History of radiation therapy   . Lymph edema    right arm   . PONV (postoperative nausea and vomiting)   . Pre-diabetes   . Sleep apnea    pt has nose pillows for cpap per pt - will bring DOS   . Vertigo     Past Surgical History:  Procedure Laterality Date  . BACK SURGERY    . BREAST LUMPECTOMY  1994   right  . CERVICAL FUSION  1995   C5-C7  . MASTECTOMY  2005   Bilat   . ROBOTIC ASSISTED TOTAL HYSTERECTOMY WITH BILATERAL SALPINGO OOPHERECTOMY N/A 08/14/2019   Procedure: XI ROBOTIC ASSISTED TOTAL HYSTERECTOMY WITH BILATERAL SALPINGO OOPHORECTOMY;  Surgeon: Rossi, Emma, MD;  Location: [facility];  Service: Gynecology;  Laterality: N/A;  . SENTINEL NODE BIOPSY N/A 08/14/2019   Procedure: SENTINEL NODE BIOPSY;  Surgeon: Rossi, Emma,  MD;  Location: [facility];  Service: Gynecology;  Laterality: N/A;    Social History   Socioeconomic History  . Marital status: Married    Spouse name: Jonetta  . Number of children: Not on file  . Years of education: Not on file  . Highest education level: Not on file  Occupational History  . Occupation: COURIER    Employer: [employer]    Comment: [employer]  Tobacco   Use  . Smoking status: Former Smoker    Types: Cigarettes    Quit date: 10/31/2006    Years since quitting: 12.8  . Smokeless tobacco: Never Used  Vaping Use  . Vaping Use: Never used  Substance and Sexual Activity  . Alcohol use: Not Currently  . Drug use: No  . Sexual activity: Not Currently    Partners: Female    Birth control/protection: None  Other Topics Concern  . Not on file  Social History Narrative   Lives with her partner, Jonetta. Former polic officer.    Social Determinants of Health   Financial Resource Strain:   . Difficulty of Paying Living Expenses:   Food Insecurity:   . Worried About Running Out of Food in the Last Year:   . Ran Out of Food in the Last Year:   Transportation Needs:   . Lack of Transportation (Medical):   . Lack of Transportation (Non-Medical):   Physical Activity:   . Days of Exercise per Week:   . Minutes of Exercise per Session:   Stress:   . Feeling of Stress :   Social Connections:   . Frequency of Communication with Friends and Family:   . Frequency of Social Gatherings with Friends and Family:   . Attends Religious Services:   . Active Member of Clubs or Organizations:   . Attends Club or Organization Meetings:   . Marital Status:      FAMILY HISTORY:  We obtained a detailed, 4-generation family history.  Significant diagnoses are listed below: Family History  Problem Relation Age of Onset  . Stroke Mother   . Hyperlipidemia Mother   . Skin cancer Mother   . Breast cancer Mother 76       ? recurrence at 83  . Endometrial cancer Mother 78  . Diabetes  Father   . Hyperlipidemia Father   . Heart attack Father 60  . Colon cancer Father 75  . Prostate cancer Father 81       Gleason 9  . Diabetes Paternal Grandmother   . Heart attack Paternal Grandmother   . Rheum arthritis Paternal Grandmother   . Hyperlipidemia Sister   . Heart attack Paternal Grandfather   . Heart attack Maternal Grandfather   . Heart attack Maternal Grandmother   . Skin cancer Maternal Grandmother   . Rheum arthritis Maternal Grandmother   . Breast cancer Sister 51  . Lymphoma Maternal Aunt 65  . Kidney cancer Maternal Aunt 60  . Cervical cancer Cousin 51        Ms. Burnstein's sister was diagnosed with breast cancer at age 51.  She is reported to have had genetic testing in 2016, which returned negative.  Her mother was diagnosed with breast cancer at 76 and had a possible recurrence at 63 years old.  She was also diagnosed with endometrial cancer at age 78.  Ms. Turnbough's mother is also reported to have genetic testing, which came back negative.  Ms. Sledge's maternal aunt was diagnosed with lymphoma at age 65 and passed away at age 65. Ms. Strutz's other maternal aunt was diagnosed with kidney cancer at age 60 and passed away at 79.  Ms. Grilli has a maternal cousin diagnosed with cervical cancer at age 51. Ms. Hilbert's father was diagnosed with colon cancer at age 75 and prostate cancer with Gleason score of 9 at 63 years old.  Ms. Palleschi had several paternal great uncles and distant relatives with colon cancer.  No   other family history of cancer was reported.   Patient's ancestors are of white/caucasian ethnicity. There is no reported Ashkenazi Jewish ancestry. There is no known consanguinity.  GENETIC COUNSELING ASSESSMENT: Ms. Cardona is a 64 y.o. female with a personal and family history of various cancers which is somewhat suggestive of a hereditary cancer syndrome and predisposition to cancer given relatives with related cancers in multiple generations. We, therefore,  discussed and recommended the following at today's visit.   DISCUSSION: We discussed that 5 - 10% of cancer is hereditary, with most cases of hereditary endometrial and colon cancer associated with Lynch syndrome, caused by pathogenic variants in MLH1, MSH2, MSH6, PMS2, and EPCAM genes.  There are other genes that can be associated with hereditary cancer syndromes.  Level of cancer risk and type of cancer risk are gene-specific. We discussed that testing is beneficial for several reasons including knowing how to follow individuals after completing their treatment and understanding if other family members could be at risk for cancer and allowing them to undergo genetic testing.   We reviewed the characteristics, features and inheritance patterns of hereditary cancer syndromes. We reviewed genetic testing that Ms. Mccormack had done previously in 2016. Genes tested in 2016, performed by [facility], included sequencing and rearrangement analysis of APC, ATM, BARD1, BMPR1, BRCA1, BRCA2, BRIP1, CDH1, CDK4, CDKN2A, CHEK2, EPCAM, MLH1, MSH2, MSH6, MUTYH, NBN, PALB2, PMS2, PTEN, RAD51C, RAD51D, SMAD4, STK11, and TP53.  Ms. Gaulin was negative for the above genes.  We discussed that a negative result does not rule out the presence of a hereditary cancer syndrome because it is possible that there is a change in a gene that this technology was unable to detect or a mutation in an unknown gene that has not yet been tested. An image of lab report is included below.     Ms. Ringstad was made aware that testing of additional genes associated with colon cancer are available as well as RNA analysis of several of the genes in which she was tested for previously.  We discussed the benefits and limitations of these options.  We briefly discussed appropriate family members to test, the process of testing, and insurance coverage. We discussed the implications of a negative, positive, and/or variant of uncertain significant result.  After  considering these options, Ms. Lefebre opted to not proceed with updated genetic testing today.   Ms. Mullinax brought up a concern about environmental exposures, citing [place].  We discussed that 10-15% of cancer may be considered familial, in which there are clusters of cancer within a family that may be due to shared environmental, lifestyle, and genetic factors.   PLAN: Ms. Ives did not wish to pursue genetic testing at today's visit. We understand this decision and remain available to coordinate genetic testing at any time in the future. We, therefore, recommend Ms. Olberding continue to follow the cancer screening guidelines given by her oncologists and primary healthcare provider.  We also recommend that Ms. Mowrer informs her family members of her personal cancer history.  Her family members may be at increased risk for cancer simply given the history of cancer in the family.   Based on Ms. Kegley's family history, we recommended her father, who was diagnosed with high-grade prostate cancer at age 81 and has a personal and family history of colon cancer, have genetic counseling and testing. Ms. Ledford will let us know if we can be of any assistance in coordinating genetic counseling and/or testing for this family member.      Lastly, we encouraged Ms. Fuchs to remain in contact with cancer genetics annually so that we can continuously update the family history and inform her of any changes in cancer genetics and testing that may be of benefit for this family.   Ms. Tullo's questions were answered to her satisfaction today. Our contact information was provided should additional questions or concerns arise. Thank you for the referral and allowing us to share in the care of your patient.   Cari M. Koerner, MS Genetic Counselor [email or web address] (P) [phone]  The patient was seen for a total of 40 minutes in face-to-face genetic counseling.  This patient was discussed with Drs. Magrinat,  Gudena and/or Feng who agrees with the above.    _______________________________________________________________________ For Office Staff:  Number of people involved in session: 1 Was an Intern/ student involved with case: no  

## 2019-09-05 NOTE — Progress Notes (Signed)
Gynecologic Oncology Follow-up Note  Chief Complaint:  Chief Complaint  Patient presents with  . Endometrial cancer [facility])    Assessment/Plan:  Ms. MIOSOTIS WETSEL  is a 64 y.o.  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 I recommend adjuvant chemotherapy with 6 cycles carb/taxol. I do not see a role for radiation given the lack of deep myo invasion or LVSI and small tumor focus.  She will need evaluation by genetics given her history of multiple primary cancers.   HPI: Justice Aguirre is a 64 year old P0 who was seen in consultation at the request of Dr Gala Romney for evaluation of grade 1 endometrial cancer.  The patient is a regular patient of Dr Roseanne Kaufman for gynecological care she experienced an episode of postmenopausal bleeding on July 06, 2019 and promptly notified her gynecologist who made an appointment to see the patient.  An endometrial biopsy was performed on July 28, 2019 which revealed FIGO grade 1 endometrioid adenocarcinoma.  A CT abdomen and pelvis was subsequently performed on August 01, 2019 and showed no evidence of abdominal pelvic metastatic disease.  A small fibroid was seen in the anterior uterine corpus measuring 1.7 cm but the uterus was otherwise unremarkable.  The patient's medical history significant for morbid obesity with a BMI of 41 kg meters squared.  She is G0.  She has no history of abdominal surgery.  She has history of breast cancer, bilateral, estrogen receptor positive.  She did not receive tamoxifen therapy for this.  She received treatment with Dr. Gunnar Bulla Magrinat.  The patient has a strong medical history for malignancy including a mother who had endometrial cancer at age 25 and separate breast cancer diagnosis at age 52.  She has a sister with a history of breast cancer and herself had a history of breast cancer.  Her mother was screened as was her sister and the patient herself with genetic testing at the current  health [place] in approximately 2017.  This genetic testing was negative for identifiable deleterious mutations.  The patient is in a same-sex relationship and married to her wife.  Her wife is a retired Therapist, sports.  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  Interval Hx:   On 08/14/19 she underwent robotic assisted TLH, BSO, SLN biopsy. Intraoperative findings were significant for no gross extrauterine disease. Surgery was uncomplicated.  Final pathology revealed an endometrioid endometrial adenocarcinoma, FIGO grade 1. There were 3 of 41m (inner half invasion) myometrial invasion, no LVSI, no cervical or lymph node involvement. There was a focus of grade 1 endometrioid adenocarcinoma in the right ovary and she was staged as FIGO stage III(MSI stable). Adjuvant therapy (6 cycles carboplatin and paclitaxel) was recommended.   Since surgery she has done well with no complaints.   Current Meds:  Outpatient Encounter Medications as of 09/05/2019  Medication Sig  . calcium carbonate (OS-CAL) 600 MG TABS Take 600 mg by mouth daily.    . cholecalciferol (VITAMIN D) 1000 UNITS tablet Take 1,000 Units by mouth daily.    .Marland Kitchendexamethasone (DECADRON) 4 MG tablet Take 2 tabs at the night before and 2 tab the morning of chemotherapy, every 3 weeks, by mouth x 6 cycles  . DULoxetine (CYMBALTA) 60 MG capsule Take 1 capsule (60 mg total) by mouth daily.  . fluticasone (FLONASE) 50 MCG/ACT nasal spray Place 2 sprays into both nostrils daily as needed for allergies or rhinitis.  .Marland Kitchengabapentin (NEURONTIN) 300  MG capsule Take 2 capsules (600 mg total) by mouth daily.  Marland Kitchen lidocaine-prilocaine (EMLA) cream Apply to affected area once  . ondansetron (ZOFRAN) 8 MG tablet Take 1 tablet (8 mg total) by mouth every 8 (eight) hours as needed. Start on the third day after chemotherapy.  . Polyethylene Glycol 3350 (MIRALAX PO) Take 17 g by mouth daily.  . prochlorperazine (COMPAZINE) 10 MG tablet Take 1 tablet (10 mg  total) by mouth every 6 (six) hours as needed (Nausea or vomiting).  Marland Kitchen rOPINIRole (REQUIP) 0.5 MG tablet TAKE 1 TABLET BY MOUTH AT  BEDTIME  . senna-docusate (SENOKOT-S) 8.6-50 MG tablet Take 2 tablets by mouth at bedtime. For AFTER surgery, do not take if having diarrhea  . vitamin C (ASCORBIC ACID) 500 MG tablet Take 500 mg by mouth daily.    . [DISCONTINUED] ibuprofen (ADVIL) 600 MG tablet Take 1 tablet (600 mg total) by mouth every 6 (six) hours as needed for moderate pain. For AFTER surgery  . [DISCONTINUED] Insulin Pen Needle (NOVOFINE) 32G X 6 MM MISC 1 EACH BY DOES NOT APPLY ROUTE DAILY. USE TO INJECT SAXENDA, ONCE DAILY.  . [DISCONTINUED] Krill Oil 1000 MG CAPS Take 1,000 mg by mouth daily.   . [DISCONTINUED] oxyCODONE (OXY IR/ROXICODONE) 5 MG immediate release tablet Take 1 tablet (5 mg total) by mouth every 4 (four) hours as needed for severe pain. For AFTER surgery, do not take and drive  . [DISCONTINUED] SAXENDA 18 MG/3ML SOPN INJECT 3 MG INTO THE SKIN DAILY. (Patient taking differently: Inject 3 mg into the skin daily. Last Dose on 08/07/2019 per pt)   No facility-administered encounter medications on file as of 09/05/2019.    Allergy: No Known Allergies  Social Hx:   Social History   Socioeconomic History  . Marital status: Married    Spouse name: [name]  . Number of children: Not on file  . Years of education: Not on file  . Highest education level: Not on file  Occupational History  . Occupation: Radio broadcast assistant: [employer]    Comment: [employer]  Tobacco Use  . Smoking status: Former Smoker    Types: Cigarettes    Quit date: 10/31/2006    Years since quitting: 12.8  . Smokeless tobacco: Never Used  Vaping Use  . Vaping Use: Never used  Substance and Sexual Activity  . Alcohol use: Not Currently  . Drug use: No  . Sexual activity: Not Currently    Partners: Female    Birth control/protection: None  Other Topics Concern  . Not on file  Social History  Narrative   Lives with her partner, [name]. Former Therapist, nutritional.    Social Determinants of Health   Financial Resource Strain:   . Difficulty of Paying Living Expenses:   Food Insecurity:   . Worried About Charity fundraiser in the Last Year:   . Arboriculturist in the Last Year:   Transportation Needs:   . Film/video editor (Medical):   Marland Kitchen Lack of Transportation (Non-Medical):   Physical Activity:   . Days of Exercise per Week:   . Minutes of Exercise per Session:   Stress:   . Feeling of Stress :   Social Connections:   . Frequency of Communication with Friends and Family:   . Frequency of Social Gatherings with Friends and Family:   . Attends Religious Services:   . Active Member of Clubs or Organizations:   . Attends Club or  Organization Meetings:   Marland Kitchen Marital Status:   Intimate Partner Violence:   . Fear of Current or Ex-Partner:   . Emotionally Abused:   Marland Kitchen Physically Abused:   . Sexually Abused:     Past Surgical Hx:  Past Surgical History:  Procedure Laterality Date  . BACK SURGERY    . BREAST LUMPECTOMY  1994   right  . CERVICAL FUSION  1995   C5-C7  . MASTECTOMY  2005   Bilat   . ROBOTIC ASSISTED TOTAL HYSTERECTOMY WITH BILATERAL SALPINGO OOPHERECTOMY N/A 08/14/2019   Procedure: XI ROBOTIC ASSISTED TOTAL HYSTERECTOMY WITH BILATERAL SALPINGO OOPHORECTOMY;  Surgeon: [name], MD;  Location: [facility];  Service: Gynecology;  Laterality: N/A;  . SENTINEL NODE BIOPSY N/A 08/14/2019   Procedure: SENTINEL NODE BIOPSY;  Surgeon: [name], MD;  Location: [facility];  Service: Gynecology;  Laterality: N/A;    Past Medical Hx:  Past Medical History:  Diagnosis Date  . Arthritis   . Breast cancer ([place])    at age 52  . Family history of breast cancer   . Family history of colon cancer   . Family history of prostate cancer in father   . Family history of uterine cancer   . History of radiation therapy   . Lymph edema    right arm   . PONV (postoperative  nausea and vomiting)   . Pre-diabetes   . Sleep apnea    pt has nose pillows for cpap per pt - will bring DOS   . Vertigo     Past Gynecological History:  See HPI No LMP recorded. Patient is postmenopausal.  Family Hx:  Family History  Problem Relation Age of Onset  . Stroke Mother   . Hyperlipidemia Mother   . Skin cancer Mother   . Breast cancer Mother 75       ? recurrence at 18  . Endometrial cancer Mother 23  . Diabetes Father   . Hyperlipidemia Father   . Heart attack Father 16  . Colon cancer Father 64  . Prostate cancer Father 80       Gleason 9  . Diabetes Paternal Grandmother   . Heart attack Paternal Grandmother   . Rheum arthritis Paternal Grandmother   . Hyperlipidemia Sister   . Heart attack Paternal Grandfather   . Heart attack Maternal Grandfather   . Heart attack Maternal Grandmother   . Skin cancer Maternal Grandmother   . Rheum arthritis Maternal Grandmother   . Breast cancer Sister 70  . Lymphoma Maternal Aunt 65  . Kidney cancer Maternal Aunt 60  . Cervical cancer Cousin 68    Review of Systems:  Constitutional  Feels well,   ENT Normal appearing ears and nares bilaterally Skin/Breast  No rash, sores, jaundice, itching, dryness Cardiovascular  No chest pain, shortness of breath, or edema  Pulmonary  No cough or wheeze.  Gastro Intestinal  No nausea, vomitting, or diarrhoea. No bright red blood per rectum, no abdominal pain, change in bowel movement, or constipation.  Genito Urinary  No frequency, urgency, dysuria, no bleeding Musculo Skeletal  No myalgia, arthralgia, joint swelling or pain  Neurologic  No weakness, numbness, change in gait,  Psychology  No depression, anxiety, insomnia.   Vitals:  Blood pressure 125/64, pulse 69, temperature 98.4 F (36.9 C), temperature source Temporal, resp. rate 18, height '5\' 5"'  (1.651 m), weight 255 lb 9.6 oz (115.9 kg), SpO2 100 %.  Physical Exam: WD in NAD Neck  Supple NROM, without any  enlargements.  Lymph Node Survey No cervical supraclavicular or inguinal adenopathy Cardiovascular  Pulse normal rate, regularity and rhythm. S1 and S2 normal.  Lungs  Clear to auscultation bilateraly, without wheezes/crackles/rhonchi. Good air movement.  Skin  No rash/lesions/breakdown  Psychiatry  Alert and oriented to person, place, and time  Abdomen  Normoactive bowel sounds, abdomen soft, non-tender and obese without evidence of hernia. Well healed incisions. Back No CVA tenderness Genito Urinary  Well healed vaginal cuff. No bleeding, no lesions. Rectal  deferred Extremities  No bilateral cyanosis, clubbing or edema.   30 minutes of direct face to face counseling time was spent with the patient. This included discussion about prognosis, therapy recommendations and postoperative side effects and are beyond the scope of routine postoperative care.   Thereasa Solo, MD  09/05/2019, 1:51 PM

## 2019-09-08 NOTE — Progress Notes (Signed)
Pharmacist Chemotherapy Monitoring - Initial Assessment    Anticipated start date: 09/12/2019   Regimen:  . Are orders appropriate based on the patient's diagnosis, regimen, and cycle? Yes . Does the plan date match the patient's scheduled date? Yes . Is the sequencing of drugs appropriate? Yes . Are the premedications appropriate for the patient's regimen? Yes . Prior Authorization for treatment is: Approved o If applicable, is the correct biosimilar selected based on the patient's insurance? not applicable  Organ Function and Labs: Marland Kitchen Are dose adjustments needed based on the patient's renal function, hepatic function, or hematologic function? Yes . Are appropriate labs ordered prior to the start of patient's treatment? Yes . Other organ system assessment, if indicated: N/A . The following baseline labs, if indicated, have been ordered: N/A  Dose Assessment: . Are the drug doses appropriate? Yes . Are the following correct: o Drug concentrations Yes o IV fluid compatible with drug Yes o Administration routes Yes o Timing of therapy Yes . If applicable, does the patient have documented access for treatment and/or plans for port-a-cath placement? yes . If applicable, have lifetime cumulative doses been properly documented and assessed? yes Lifetime Dose Tracking  No doses have been documented on this patient for the following tracked chemicals: Doxorubicin, Epirubicin, Idarubicin, Daunorubicin, Mitoxantrone, Bleomycin, Oxaliplatin, Carboplatin, Liposomal Doxorubicin  o   Toxicity Monitoring/Prevention: . The patient has the following take home antiemetics prescribed: Ondansetron and Prochlorperazine . The patient has the following take home medications prescribed: N/A . Medication allergies and previous infusion related reactions, if applicable, have been reviewed and addressed. Yes . The patient's current medication list has been assessed for drug-drug interactions with their  chemotherapy regimen. no significant drug-drug interactions were identified on review.  Order Review: . Are the treatment plan orders signed? Yes . Is the patient scheduled to see a provider prior to their treatment? No  I verify that I have reviewed each item in the above checklist and answered each question accordingly.  Davidson Palmieri D 09/08/2019 3:23 PM

## 2019-09-09 ENCOUNTER — Other Ambulatory Visit: Payer: Self-pay

## 2019-09-09 ENCOUNTER — Inpatient Hospital Stay: Payer: 59

## 2019-09-10 ENCOUNTER — Other Ambulatory Visit: Payer: Self-pay | Admitting: Student

## 2019-09-11 ENCOUNTER — Ambulatory Visit (HOSPITAL_COMMUNITY)
Admission: RE | Admit: 2019-09-11 | Discharge: 2019-09-11 | Disposition: A | Discharge status: Home / Self Care | Payer: 59 | Source: Ambulatory Visit | Attending: Hematology and Oncology | Admitting: Hematology and Oncology

## 2019-09-11 ENCOUNTER — Other Ambulatory Visit: Payer: Self-pay

## 2019-09-11 ENCOUNTER — Other Ambulatory Visit: Payer: Self-pay | Admitting: Hematology and Oncology

## 2019-09-11 DIAGNOSIS — Z87891 Personal history of nicotine dependence: Secondary | ICD-10-CM | POA: Insufficient documentation

## 2019-09-11 DIAGNOSIS — G4730 Sleep apnea, unspecified: Secondary | ICD-10-CM | POA: Insufficient documentation

## 2019-09-11 DIAGNOSIS — Z853 Personal history of malignant neoplasm of breast: Secondary | ICD-10-CM | POA: Insufficient documentation

## 2019-09-11 DIAGNOSIS — Z79899 Other long term (current) drug therapy: Secondary | ICD-10-CM | POA: Diagnosis not present

## 2019-09-11 DIAGNOSIS — R7303 Prediabetes: Secondary | ICD-10-CM | POA: Diagnosis not present

## 2019-09-11 DIAGNOSIS — C541 Malignant neoplasm of endometrium: Secondary | ICD-10-CM | POA: Diagnosis present

## 2019-09-11 HISTORY — PX: IR IMAGING GUIDED PORT INSERTION: IMG5740

## 2019-09-11 LAB — COMPREHENSIVE METABOLIC PANEL
ALT: 24 U/L (ref 0–44)
AST: 25 U/L (ref 15–41)
Albumin: 4.4 g/dL (ref 3.5–5.0)
Alkaline Phosphatase: 119 U/L (ref 38–126)
Anion gap: 12 (ref 5–15)
BUN: 17 mg/dL (ref 8–23)
CO2: 25 mmol/L (ref 22–32)
Calcium: 9.4 mg/dL (ref 8.9–10.3)
Chloride: 103 mmol/L (ref 98–111)
Creatinine, Ser: 0.76 mg/dL (ref 0.44–1.00)
GFR calc Af Amer: >60 mL/min (ref >60)
GFR calc non Af Amer: >60 mL/min (ref >60)
Glucose, Bld: 115 mg/dL — ABNORMAL HIGH (ref 70–99)
Potassium: 4.4 mmol/L (ref 3.5–5.1)
Sodium: 140 mmol/L (ref 135–145)
Total Bilirubin: 0.7 mg/dL (ref 0.3–1.2)
Total Protein: 7.7 g/dL (ref 6.5–8.1)

## 2019-09-11 LAB — CBC WITH DIFFERENTIAL/PLATELET
Abs Immature Granulocytes: 0.02 10*3/uL (ref 0.00–0.07)
Basophils Absolute: 0.1 10*3/uL (ref 0.0–0.1)
Basophils Relative: 1 %
Eosinophils Absolute: 0.2 10*3/uL (ref 0.0–0.5)
Eosinophils Relative: 2 %
HCT: 40.6 % (ref 36.0–46.0)
Hemoglobin: 13.3 g/dL (ref 12.0–15.0)
Immature Granulocytes: 0 %
Lymphocytes Relative: 23 %
Lymphs Abs: 1.8 10*3/uL (ref 0.7–4.0)
MCH: 31.9 pg (ref 26.0–34.0)
MCHC: 32.8 g/dL (ref 30.0–36.0)
MCV: 97.4 fL (ref 80.0–100.0)
Monocytes Absolute: 0.6 10*3/uL (ref 0.1–1.0)
Monocytes Relative: 7 %
Neutro Abs: 5.5 10*3/uL (ref 1.7–7.7)
Neutrophils Relative %: 67 %
Platelets: 321 10*3/uL (ref 150–400)
RBC: 4.17 MIL/uL (ref 3.87–5.11)
RDW: 12.2 % (ref 11.5–15.5)
WBC: 8.1 10*3/uL (ref 4.0–10.5)
nRBC: 0 % (ref 0.0–0.2)

## 2019-09-11 MED ORDER — MIDAZOLAM HCL 2 MG/2ML IJ SOLN
INTRAMUSCULAR | Status: AC | PRN
  Administered 2019-09-11 (×3): 1 mg via INTRAVENOUS

## 2019-09-11 MED ORDER — CEFAZOLIN SODIUM-DEXTROSE 2-4 GM/100ML-% IV SOLN
2.0000 g | Freq: Once | INTRAVENOUS | Status: AC
  Administered 2019-09-11: 2 g via INTRAVENOUS

## 2019-09-11 MED ORDER — LIDOCAINE HCL (PF) 1 % IJ SOLN
INTRAMUSCULAR | Status: AC | PRN
  Administered 2019-09-11: 10 mL

## 2019-09-11 MED ORDER — SODIUM CHLORIDE 0.9 % IV SOLN: INTRAVENOUS | Status: DC

## 2019-09-11 MED ORDER — FENTANYL CITRATE (PF) 100 MCG/2ML IJ SOLN
INTRAMUSCULAR | Status: AC | PRN
  Administered 2019-09-11 (×2): 50 ug via INTRAVENOUS

## 2019-09-11 NOTE — Procedures (Signed)
Interventional Radiology Procedure Note  Procedure: Single Lumen Power Port Placement    Access:  Right IJ vein.  Findings: Catheter tip positioned at SVC/RA junction. Port is ready for immediate use.   Complications: None  EBL: < 10 mL  Recommendations:  - Ok to shower in 24 hours - Do not submerge for 7 days - Routine line care   Shanin Szymanowski T. Leiam Hopwood, M.D Pager:  [phone]   

## 2019-09-11 NOTE — Discharge Instructions (Addendum)
Urgent needs - IR on call MD [phone]  Wound - May remove dressing and shower in 24 to 48 hours.  Keep site clean and dry.  Replace with bandaid. Do not submerge in tub or water until site healing well.  If ordered by your provider, may start Emla cream in 2 weeks or after incision is healed.  After completion of treatment, your provider should have you set up for monthly port flushes.                                                                                                                                             Implanted Port Insertion, Care After This sheet gives you information about how to care for yourself after your procedure. Your health care provider may also give you more specific instructions. If you have problems or questions, contact your health care provider. What can I expect after the procedure? After the procedure, it is common to have:  Discomfort at the port insertion site.  Bruising on the skin over the port. This should improve over 3-4 days. Follow these instructions at home: [facility] care  After your port is placed, you will get a manufacturer's information card. The card has information about your port. Keep this card with you at all times.  Take care of the port as told by your health care provider. Ask your health care provider if you or a family member can get training for taking care of the port at home. A home health care nurse may also take care of the port.  Make sure to remember what type of port you have. Incision care  Follow instructions from your health care provider about how to take care of your port insertion site. Make sure you: ? Wash your hands with soap and water before and after you change your bandage (dressing). If soap and water are not available, use hand sanitizer. ? Change your dressing as told by your health care provider. ? Leave stitches (sutures), skin glue, or adhesive strips in place. These skin closures may need to stay  in place for 2 weeks or longer. If adhesive strip edges start to loosen and curl up, you may trim the loose edges. Do not remove adhesive strips completely unless your health care provider tells you to do that.  Check your port insertion site every day for signs of infection. Check for: ? Redness, swelling, or pain. ? Fluid or blood. ? Warmth. ? Pus or a bad smell. Activity  Return to your normal activities as told by your health care provider. Ask your health care provider what activities are safe for you.  Do not lift anything that is heavier than 10 lb (4.5 kg), or the limit that you are told, until your health care provider says that it is safe. General instructions  Take over-the-counter and prescription medicines only as  told by your health care provider.  Do not take baths, swim, or use a hot tub until your health care provider approves. Ask your health care provider if you may take showers. You may only be allowed to take sponge baths.  Do not drive for 24 hours if you were given a sedative during your procedure.  Wear a medical alert bracelet in case of an emergency. This will tell any health care providers that you have a port.  Keep all follow-up visits as told by your health care provider. This is important. Contact a health care provider if:  You cannot flush your port with saline as directed, or you cannot draw blood from the port.  You have a fever or chills.  You have redness, swelling, or pain around your port insertion site.  You have fluid or blood coming from your port insertion site.  Your port insertion site feels warm to the touch.  You have pus or a bad smell coming from the port insertion site. Get help right away if:  You have chest pain or shortness of breath.  You have bleeding from your port that you cannot control. Summary  Take care of the port as told by your health care provider. Keep the manufacturer's information card with you at all  times.  Change your dressing as told by your health care provider.  Contact a health care provider if you have a fever or chills or if you have redness, swelling, or pain around your port insertion site.  Keep all follow-up visits as told by your health care provider. This information is not intended to replace advice given to you by your health care provider. Make sure you discuss any questions you have with your health care provider. Document Revised: 08/14/2017 Document Reviewed: 08/14/2017 Elsevier Patient Education  2020 Elsevier In   Moderate Conscious Sedation, Adult, Care After These instructions provide you with information about caring for yourself after your procedure. Your health care provider may also give you more specific instructions. Your treatment has been planned according to current medical practices, but problems sometimes occur. Call your health care provider if you have any problems or questions after your procedure. What can I expect after the procedure? After your procedure, it is common:  To feel sleepy for several hours.  To feel clumsy and have poor balance for several hours.  To have poor judgment for several hours.  To vomit if you eat too soon. Follow these instructions at home: For at least 24 hours after the procedure:  Do not: ? Participate in activities where you could fall or become injured. ? Drive. ? Use heavy machinery. ? Drink alcohol. ? Take sleeping pills or medicines that cause drowsiness. ? Make important decisions or sign legal documents. ? Take care of children on your own.  Rest. Eating and drinking  Follow the diet recommended by your health care provider.  If you vomit: ? Drink water, juice, or soup when you can drink without vomiting. ? Make sure you have little or no nausea before eating solid foods. General instructions  Have a responsible adult stay with you until you are awake and alert.  Take over-the-counter and  prescription medicines only as told by your health care provider.  If you smoke, do not smoke without supervision.  Keep all follow-up visits as told by your health care provider. This is important. Contact a health care provider if:  You keep feeling nauseous or you keep vomiting.  You feel light-headed.  You develop a rash.  You have a fever. Get help right away if:  You have trouble breathing. This information is not intended to replace advice given to you by your health care provider. Make sure you discuss any questions you have with your health care provider. Document Revised: 12/29/2016 Document Reviewed: 05/08/2015 Elsevier Patient Education  2020 [employer].

## 2019-09-11 NOTE — H&P (Signed)
Chief Complaint: Patient was seen in consultation today for endometrial cancer/Port-a-cath placement.  Referring Physician(s): [name]  Supervising Physician: Aletta Edouard  Patient Status: Patricia Waters - Out-pt  History of Present Illness: Patricia Waters is a 64 y.o. female with a past medical history of vertigo, breast cancer s/p lumpectomy and radiation therapy, endometrial cancer, pre-diabetes, and arthritis. She was unfortunately diagnosed with endometrial cancer in 6.2021. Her cancer is managed by Dr. [name] and Dr. [name]. She underwent a robotic-assisted laparoscoptic total hysterectomy with bilateral salpingoophrectomy in OR 08/14/2019 by Dr. [name]. She has tentative plans to begin systemic chemotherapy as management.  IR requested by Dr. [name] for possible image-guided Port-a-cath placement. Patient awake and alert sitting in bed with no complaints at this time. Denies fever, chills, chest pain, dyspnea, abdominal pain, or headache.   Past Medical History:  Diagnosis Date  . Arthritis   . Breast cancer ([place])    at age 52  . Family history of breast cancer   . Family history of colon cancer   . Family history of prostate cancer in father   . Family history of uterine cancer   . History of radiation therapy   . Lymph edema    right arm   . PONV (postoperative nausea and vomiting)   . Pre-diabetes   . Sleep apnea    pt has nose pillows for cpap per pt - will bring DOS   . Vertigo     Past Surgical History:  Procedure Laterality Date  . BACK SURGERY    . BREAST LUMPECTOMY  1994   right  . CERVICAL FUSION  1995   C5-C7  . MASTECTOMY  2005   Bilat   . ROBOTIC ASSISTED TOTAL HYSTERECTOMY WITH BILATERAL SALPINGO OOPHERECTOMY N/A 08/14/2019   Procedure: XI ROBOTIC ASSISTED TOTAL HYSTERECTOMY WITH BILATERAL SALPINGO OOPHORECTOMY;  Surgeon: [name], MD;  Location: [facility];  Service: Gynecology;  Laterality: N/A;  . SENTINEL NODE BIOPSY N/A 08/14/2019   Procedure:  SENTINEL NODE BIOPSY;  Surgeon: [name], MD;  Location: [facility];  Service: Gynecology;  Laterality: N/A;    Allergies: Patient has no known allergies.  Medications: Prior to Admission medications   Medication Sig Start Date End Date Taking? Authorizing Provider  cholecalciferol (VITAMIN D) 1000 UNITS tablet Take 1,000 Units by mouth daily.     Yes [provider]  fluticasone (FLONASE) 50 MCG/ACT nasal spray Place 2 sprays into both nostrils daily as needed for allergies or rhinitis.   Yes [provider]  gabapentin (NEURONTIN) 300 MG capsule Take 2 capsules (600 mg total) by mouth daily. 06/20/19  Yes Hali Marry, MD  Multiple Vitamins-Minerals (PRESERVISION AREDS) CAPS Take 1 capsule by mouth 1 day or 1 dose.   Yes [provider]  rOPINIRole (REQUIP) 0.5 MG tablet TAKE 1 TABLET BY MOUTH AT  BEDTIME 08/15/19  Yes Hali Marry, MD  senna-docusate (SENOKOT-S) 8.6-50 MG tablet Take 2 tablets by mouth at bedtime. For AFTER surgery, do not take if having diarrhea 08/05/19  Yes Cross, Melissa D, NP  vitamin C (ASCORBIC ACID) 500 MG tablet Take 500 mg by mouth daily.     Yes [provider]  calcium carbonate (OS-CAL) 600 MG TABS Take 600 mg by mouth daily.      [provider]  dexamethasone (DECADRON) 4 MG tablet Take 2 tabs at the night before and 2 tab the morning of chemotherapy, every 3 weeks, by mouth x 6 cycles 09/04/19  [name], MD  DULoxetine (CYMBALTA) 60 MG capsule Take 1 capsule (60 mg total) by mouth daily. 06/20/19   Hali Marry, MD  lidocaine-prilocaine (EMLA) cream Apply to affected area once 09/04/19   [name], MD  ondansetron (ZOFRAN) 8 MG tablet Take 1 tablet (8 mg total) by mouth every 8 (eight) hours as needed. Start on the third day after chemotherapy. 09/04/19   [name], MD  Polyethylene Glycol 3350 (MIRALAX PO) Take 17 g by mouth daily.    [provider]  prochlorperazine (COMPAZINE) 10  MG tablet Take 1 tablet (10 mg total) by mouth every 6 (six) hours as needed (Nausea or vomiting). 09/04/19   [name], MD     Family History  Problem Relation Age of Onset  . Stroke Mother   . Hyperlipidemia Mother   . Skin cancer Mother   . Breast cancer Mother 8       ? recurrence at 20  . Endometrial cancer Mother 59  . Diabetes Father   . Hyperlipidemia Father   . Heart attack Father 81  . Colon cancer Father 15  . Prostate cancer Father 5       [place] 9  . Diabetes Paternal Grandmother   . Heart attack Paternal Grandmother   . Rheum arthritis Paternal Grandmother   . Hyperlipidemia Sister   . Heart attack Paternal Grandfather   . Heart attack Maternal Grandfather   . Heart attack Maternal Grandmother   . Skin cancer Maternal Grandmother   . Rheum arthritis Maternal Grandmother   . Breast cancer Sister 3  . Lymphoma Maternal Aunt 65  . Kidney cancer Maternal Aunt 60  . Cervical cancer Cousin 105    Social History   Socioeconomic History  . Marital status: Married    Spouse name: [name]  . Number of children: Not on file  . Years of education: Not on file  . Highest education level: Not on file  Occupational History  . Occupation: Radio broadcast assistant: [employer]    Comment: [employer]  Tobacco Use  . Smoking status: Former Smoker    Types: Cigarettes    Quit date: 10/31/2006    Years since quitting: 12.8  . Smokeless tobacco: Never Used  Vaping Use  . Vaping Use: Never used  Substance and Sexual Activity  . Alcohol use: Not Currently  . Drug use: No  . Sexual activity: Not Currently    Partners: Female    Birth control/protection: None  Other Topics Concern  . Not on file  Social History Narrative   Lives with her partner, [name]. Former Therapist, nutritional.    Social Determinants of Health   Financial Resource Strain:   . Difficulty of Paying Living Expenses:   Food Insecurity:   . Worried About Charity fundraiser in the Last Year:   . Youth worker in the Last Year:   Transportation Needs:   . Film/video editor (Medical):   Marland Kitchen Lack of Transportation (Non-Medical):   Physical Activity:   . Days of Exercise per Week:   . Minutes of Exercise per Session:   Stress:   . Feeling of Stress :   Social Connections:   . Frequency of Communication with Friends and Family:   . Frequency of Social Gatherings with Friends and Family:   . Attends Religious Services:   . Active Member of Clubs or Organizations:   . Attends Archivist Meetings:   .  Marital Status:      Review of Systems: A 12 point ROS discussed and pertinent positives are indicated in the HPI above.  All other systems are negative.  Review of Systems  Constitutional: Negative for chills and fever.  Respiratory: Negative for shortness of breath and wheezing.   Cardiovascular: Negative for chest pain and palpitations.  Gastrointestinal: Negative for abdominal pain.  Neurological: Negative for headaches.  Psychiatric/Behavioral: Negative for behavioral problems and confusion.    Vital Signs: BP 126/65 (BP Location: Right Arm)   Pulse 74   Temp 98.2 F (36.8 C) (Oral)   Resp 18   SpO2 99%   Physical Exam Vitals and nursing note reviewed.  Constitutional:      General: She is not in acute distress.    Appearance: Normal appearance.  Cardiovascular:     Rate and Rhythm: Normal rate and regular rhythm.     Heart sounds: Normal heart sounds. No murmur heard.   Pulmonary:     Effort: Pulmonary effort is normal. No respiratory distress.     Breath sounds: Normal breath sounds. No wheezing.  Skin:    General: Skin is warm and dry.  Neurological:     Mental Status: She is alert and oriented to person, place, and time.      MD Evaluation Airway: WNL Heart: WNL Abdomen: WNL Chest/ Lungs: WNL ASA  Classification: 3 Mallampati/Airway Score: One   Imaging: No results found.  Labs:  CBC: Recent Labs    08/11/19 1118 09/11/19 0830    WBC 9.3 8.1  HGB 13.4 13.3  HCT 42.3 40.6  PLT 314 321    COAGS: No results for input(s): INR, APTT in the last 8760 hours.  BMP: Recent Labs    03/24/19 1041 07/31/19 0000 08/11/19 1118  NA 138  --  143  K 4.6  --  4.3  CL 102  --  106  CO2 30  --  28  GLUCOSE 95  --  98  BUN 9 11 10   CALCIUM 9.7  --  9.3  CREATININE 0.78 0.90 0.74  GFRNONAA 81 68 >60  GFRAA 94 79 >60    LIVER FUNCTION TESTS: Recent Labs    03/24/19 1041 08/11/19 1118  BILITOT 0.4 0.8  AST 22 33  ALT 21 31  ALKPHOS  --  114  PROT 7.1 7.9  ALBUMIN  --  4.3     Assessment and Plan:  Endometrial cancer with tentative plans to begin systemic chemotherapy as management. Plan for image-guided Port-a-cath placement today in IR. Patient is NPO. Afebrile and WBCs WNL. She does not take blood thinners.  Risks and benefits of image-guided Port-a-catheter placement were discussed with the patient including, but not limited to bleeding, infection, pneumothorax, or fibrin sheath development and need for additional procedures. All of the patient's questions were answered, patient is agreeable to proceed. Consent signed and in chart.   Thank you for this interesting consult.  I greatly enjoyed meeting ALAINAH PHANG and look forward to participating in their care.  A copy of this report was sent to the requesting provider on this date.  Electronically Signed: Earley Abide, PA-C 09/11/2019, 8:48 AM   I spent a total of 30 Minutes in face to face in clinical consultation, greater than 50% of which was counseling/coordinating care for endometrial cancer/Port-a-cath placement.

## 2019-09-12 ENCOUNTER — Inpatient Hospital Stay: Payer: 59

## 2019-09-12 ENCOUNTER — Other Ambulatory Visit: Payer: Self-pay

## 2019-09-12 ENCOUNTER — Encounter: Payer: Self-pay | Admitting: Hematology and Oncology

## 2019-09-12 VITALS — BP 149/74 | HR 61 | Temp 99.4°F | Resp 18

## 2019-09-12 DIAGNOSIS — C541 Malignant neoplasm of endometrium: Secondary | ICD-10-CM | POA: Diagnosis not present

## 2019-09-12 MED ORDER — FAMOTIDINE IN NACL 20-0.9 MG/50ML-% IV SOLN
INTRAVENOUS | Status: AC
  Filled 2019-09-12: qty 50

## 2019-09-12 MED ORDER — SODIUM CHLORIDE 0.9 % IV SOLN
10.0000 mg | Freq: Once | INTRAVENOUS | Status: AC
  Administered 2019-09-12: 10 mg via INTRAVENOUS
  Filled 2019-09-12: qty 10

## 2019-09-12 MED ORDER — FAMOTIDINE IN NACL 20-0.9 MG/50ML-% IV SOLN
20.0000 mg | Freq: Once | INTRAVENOUS | Status: AC
  Administered 2019-09-12: 20 mg via INTRAVENOUS

## 2019-09-12 MED ORDER — SODIUM CHLORIDE 0.9% FLUSH
10.0000 mL | INTRAVENOUS | Status: DC | PRN
  Administered 2019-09-12: 10 mL
  Filled 2019-09-12: qty 10

## 2019-09-12 MED ORDER — PALONOSETRON HCL INJECTION 0.25 MG/5ML
INTRAVENOUS | Status: AC
  Filled 2019-09-12: qty 5

## 2019-09-12 MED ORDER — DIPHENHYDRAMINE HCL 50 MG/ML IJ SOLN
INTRAMUSCULAR | Status: AC
  Filled 2019-09-12: qty 1

## 2019-09-12 MED ORDER — HEPARIN SOD (PORK) LOCK FLUSH 100 UNIT/ML IV SOLN
500.0000 [IU] | Freq: Once | INTRAVENOUS | Status: AC | PRN
  Administered 2019-09-12: 500 [IU]
  Filled 2019-09-12: qty 5

## 2019-09-12 MED ORDER — SODIUM CHLORIDE 0.9 % IV SOLN
150.0000 mg | Freq: Once | INTRAVENOUS | Status: AC
  Administered 2019-09-12: 150 mg via INTRAVENOUS
  Filled 2019-09-12: qty 150

## 2019-09-12 MED ORDER — SODIUM CHLORIDE 0.9 % IV SOLN
Freq: Once | INTRAVENOUS | Status: AC
  Filled 2019-09-12: qty 250

## 2019-09-12 MED ORDER — DIPHENHYDRAMINE HCL 50 MG/ML IJ SOLN
50.0000 mg | Freq: Once | INTRAMUSCULAR | Status: AC
  Administered 2019-09-12: 50 mg via INTRAVENOUS

## 2019-09-12 MED ORDER — PALONOSETRON HCL INJECTION 0.25 MG/5ML
0.2500 mg | Freq: Once | INTRAVENOUS | Status: AC
  Administered 2019-09-12: 0.25 mg via INTRAVENOUS

## 2019-09-12 MED ORDER — SODIUM CHLORIDE 0.9 % IV SOLN
750.0000 mg | Freq: Once | INTRAVENOUS | Status: AC
  Administered 2019-09-12: 750 mg via INTRAVENOUS
  Filled 2019-09-12: qty 75

## 2019-09-12 MED ORDER — SODIUM CHLORIDE 0.9 % IV SOLN
140.0000 mg/m2 | Freq: Once | INTRAVENOUS | Status: AC
  Administered 2019-09-12: 324 mg via INTRAVENOUS
  Filled 2019-09-12: qty 54

## 2019-09-12 NOTE — Progress Notes (Signed)
Met w/ pt to introduce myself as her Arboriculturist.  Unfortunately there aren't any foundations offering copay assistance for her Dx.  I informed her of the [employer], went over what it covers, gave her an expense and the income requirement but she stated she exceeds that amount so she doesn't qualify for the grant at this time.  I gave her my card in case her situation changes and for any questions or concerns she may have in the future.

## 2019-09-12 NOTE — Patient Instructions (Signed)
[facility] Discharge Instructions for Patients Receiving Chemotherapy  Today you received the following chemotherapy agents: Taxol, Carboplatin  To help prevent nausea and vomiting after your treatment, we encourage you to take your nausea medication as directed.   If you develop nausea and vomiting that is not controlled by your nausea medication, call the clinic.   BELOW ARE SYMPTOMS THAT SHOULD BE REPORTED IMMEDIATELY:  *FEVER GREATER THAN 100.5 F  *CHILLS WITH OR WITHOUT FEVER  NAUSEA AND VOMITING THAT IS NOT CONTROLLED WITH YOUR NAUSEA MEDICATION  *UNUSUAL SHORTNESS OF BREATH  *UNUSUAL BRUISING OR BLEEDING  TENDERNESS IN MOUTH AND THROAT WITH OR WITHOUT PRESENCE OF ULCERS  *URINARY PROBLEMS  *BOWEL PROBLEMS  UNUSUAL RASH Items with * indicate a potential emergency and should be followed up as soon as possible.  Feel free to call the clinic should you have any questions or concerns. The clinic phone number is [phone].  Please show the CHEMO ALERT CARD at check-in to the Emergency Department and triage nurse.  Paclitaxel injection What is this medicine? PACLITAXEL (PAK li TAX el) is a chemotherapy drug. It targets fast dividing cells, like cancer cells, and causes these cells to die. This medicine is used to treat ovarian cancer, breast cancer, lung cancer, Kaposi's sarcoma, and other cancers. This medicine may be used for other purposes; ask your health care provider or pharmacist if you have questions. COMMON BRAND NAME(S): Onxol, Taxol What should I tell my health care provider before I take this medicine? They need to know if you have any of these conditions:  history of irregular heartbeat  liver disease  low blood counts, like low white cell, platelet, or red cell counts  lung or breathing disease, like asthma  tingling of the fingers or toes, or other nerve disorder  an unusual or allergic reaction to paclitaxel, alcohol,  polyoxyethylated castor oil, other chemotherapy, other medicines, foods, dyes, or preservatives  pregnant or trying to get pregnant  breast-feeding How should I use this medicine? This drug is given as an infusion into a vein. It is administered in a hospital or clinic by a specially trained health care professional. Talk to your pediatrician regarding the use of this medicine in children. Special care may be needed. Overdosage: If you think you have taken too much of this medicine contact a poison control center or emergency room at once. NOTE: This medicine is only for you. Do not share this medicine with others. What if I miss a dose? It is important not to miss your dose. Call your doctor or health care professional if you are unable to keep an appointment. What may interact with this medicine? Do not take this medicine with any of the following medications:  disulfiram  metronidazole This medicine may also interact with the following medications:  antiviral medicines for hepatitis, HIV or AIDS  certain antibiotics like erythromycin and clarithromycin  certain medicines for fungal infections like ketoconazole and itraconazole  certain medicines for seizures like carbamazepine, phenobarbital, phenytoin  gemfibrozil  nefazodone  rifampin  St. John's wort This list may not describe all possible interactions. Give your health care provider a list of all the medicines, herbs, non-prescription drugs, or dietary supplements you use. Also tell them if you smoke, drink alcohol, or use illegal drugs. Some items may interact with your medicine. What should I watch for while using this medicine? Your condition will be monitored carefully while you are receiving this medicine. You will need important blood   work done while you are taking this medicine. This medicine can cause serious allergic reactions. To reduce your risk you will need to take other medicine(s) before treatment with this  medicine. If you experience allergic reactions like skin rash, itching or hives, swelling of the face, lips, or tongue, tell your doctor or health care professional right away. In some cases, you may be given additional medicines to help with side effects. Follow all directions for their use. This drug may make you feel generally unwell. This is not uncommon, as chemotherapy can affect healthy cells as well as cancer cells. Report any side effects. Continue your course of treatment even though you feel ill unless your doctor tells you to stop. Call your doctor or health care professional for advice if you get a fever, chills or sore throat, or other symptoms of a cold or flu. Do not treat yourself. This drug decreases your body's ability to fight infections. Try to avoid being around people who are sick. This medicine may increase your risk to bruise or bleed. Call your doctor or health care professional if you notice any unusual bleeding. Be careful brushing and flossing your teeth or using a toothpick because you may get an infection or bleed more easily. If you have any dental work done, tell your dentist you are receiving this medicine. Avoid taking products that contain aspirin, acetaminophen, ibuprofen, naproxen, or ketoprofen unless instructed by your doctor. These medicines may hide a fever. Do not become pregnant while taking this medicine. Women should inform their doctor if they wish to become pregnant or think they might be pregnant. There is a potential for serious side effects to an unborn child. Talk to your health care professional or pharmacist for more information. Do not breast-feed an infant while taking this medicine. Men are advised not to father a child while receiving this medicine. This product may contain alcohol. Ask your pharmacist or healthcare provider if this medicine contains alcohol. Be sure to tell all healthcare providers you are taking this medicine. Certain medicines,  like metronidazole and disulfiram, can cause an unpleasant reaction when taken with alcohol. The reaction includes flushing, headache, nausea, vomiting, sweating, and increased thirst. The reaction can last from 30 minutes to several hours. What side effects may I notice from receiving this medicine? Side effects that you should report to your doctor or health care professional as soon as possible:  allergic reactions like skin rash, itching or hives, swelling of the face, lips, or tongue  breathing problems  changes in vision  fast, irregular heartbeat  high or low blood pressure  mouth sores  pain, tingling, numbness in the hands or feet  signs of decreased platelets or bleeding - bruising, pinpoint red spots on the skin, black, tarry stools, blood in the urine  signs of decreased red blood cells - unusually weak or tired, feeling faint or lightheaded, falls  signs of infection - fever or chills, cough, sore throat, pain or difficulty passing urine  signs and symptoms of liver injury like dark yellow or brown urine; general ill feeling or flu-like symptoms; light-colored stools; loss of appetite; nausea; right upper belly pain; unusually weak or tired; yellowing of the eyes or skin  swelling of the ankles, feet, hands  unusually slow heartbeat Side effects that usually do not require medical attention (report to your doctor or health care professional if they continue or are bothersome):  diarrhea  hair loss  loss of appetite  muscle or joint pain    nausea, vomiting  pain, redness, or irritation at site where injected  tiredness This list may not describe all possible side effects. Call your doctor for medical advice about side effects. You may report side effects to FDA at 1-800-FDA-1088. Where should I keep my medicine? This drug is given in a hospital or clinic and will not be stored at home. NOTE: This sheet is a summary. It may not cover all possible information.  If you have questions about this medicine, talk to your doctor, pharmacist, or health care provider.  2020 Elsevier/Gold Standard (2016-09-19 13:14:55)  Carboplatin injection What is this medicine? CARBOPLATIN (KAR boe pla tin) is a chemotherapy drug. It targets fast dividing cells, like cancer cells, and causes these cells to die. This medicine is used to treat ovarian cancer and many other cancers. This medicine may be used for other purposes; ask your health care provider or pharmacist if you have questions. COMMON BRAND NAME(S): Paraplatin What should I tell my health care provider before I take this medicine? They need to know if you have any of these conditions:  blood disorders  hearing problems  kidney disease  recent or ongoing radiation therapy  an unusual or allergic reaction to carboplatin, cisplatin, other chemotherapy, other medicines, foods, dyes, or preservatives  pregnant or trying to get pregnant  breast-feeding How should I use this medicine? This drug is usually given as an infusion into a vein. It is administered in a hospital or clinic by a specially trained health care professional. Talk to your pediatrician regarding the use of this medicine in children. Special care may be needed. Overdosage: If you think you have taken too much of this medicine contact a poison control center or emergency room at once. NOTE: This medicine is only for you. Do not share this medicine with others. What if I miss a dose? It is important not to miss a dose. Call your doctor or health care professional if you are unable to keep an appointment. What may interact with this medicine?  medicines for seizures  medicines to increase blood counts like filgrastim, pegfilgrastim, sargramostim  some antibiotics like amikacin, gentamicin, neomycin, streptomycin, tobramycin  vaccines Talk to your doctor or health care professional before taking any of these  medicines:  acetaminophen  aspirin  ibuprofen  ketoprofen  naproxen This list may not describe all possible interactions. Give your health care provider a list of all the medicines, herbs, non-prescription drugs, or dietary supplements you use. Also tell them if you smoke, drink alcohol, or use illegal drugs. Some items may interact with your medicine. What should I watch for while using this medicine? Your condition will be monitored carefully while you are receiving this medicine. You will need important blood work done while you are taking this medicine. This drug may make you feel generally unwell. This is not uncommon, as chemotherapy can affect healthy cells as well as cancer cells. Report any side effects. Continue your course of treatment even though you feel ill unless your doctor tells you to stop. In some cases, you may be given additional medicines to help with side effects. Follow all directions for their use. Call your doctor or health care professional for advice if you get a fever, chills or sore throat, or other symptoms of a cold or flu. Do not treat yourself. This drug decreases your body's ability to fight infections. Try to avoid being around people who are sick. This medicine may increase your risk to bruise or bleed.   Call your doctor or health care professional if you notice any unusual bleeding. Be careful brushing and flossing your teeth or using a toothpick because you may get an infection or bleed more easily. If you have any dental work done, tell your dentist you are receiving this medicine. Avoid taking products that contain aspirin, acetaminophen, ibuprofen, naproxen, or ketoprofen unless instructed by your doctor. These medicines may hide a fever. Do not become pregnant while taking this medicine. Women should inform their doctor if they wish to become pregnant or think they might be pregnant. There is a potential for serious side effects to an unborn child. Talk  to your health care professional or pharmacist for more information. Do not breast-feed an infant while taking this medicine. What side effects may I notice from receiving this medicine? Side effects that you should report to your doctor or health care professional as soon as possible:  allergic reactions like skin rash, itching or hives, swelling of the face, lips, or tongue  signs of infection - fever or chills, cough, sore throat, pain or difficulty passing urine  signs of decreased platelets or bleeding - bruising, pinpoint red spots on the skin, black, tarry stools, nosebleeds  signs of decreased red blood cells - unusually weak or tired, fainting spells, lightheadedness  breathing problems  changes in hearing  changes in vision  chest pain  high blood pressure  low blood counts - This drug may decrease the number of white blood cells, red blood cells and platelets. You may be at increased risk for infections and bleeding.  nausea and vomiting  pain, swelling, redness or irritation at the injection site  pain, tingling, numbness in the hands or feet  problems with balance, talking, walking  trouble passing urine or change in the amount of urine Side effects that usually do not require medical attention (report to your doctor or health care professional if they continue or are bothersome):  hair loss  loss of appetite  metallic taste in the mouth or changes in taste This list may not describe all possible side effects. Call your doctor for medical advice about side effects. You may report side effects to FDA at 1-800-FDA-1088. Where should I keep my medicine? This drug is given in a hospital or clinic and will not be stored at home. NOTE: This sheet is a summary. It may not cover all possible information. If you have questions about this medicine, talk to your doctor, pharmacist, or health care provider.  2020 Elsevier/Gold Standard (2007-04-23 14:38:05)  

## 2019-09-15 ENCOUNTER — Telehealth: Payer: Self-pay | Admitting: *Deleted

## 2019-09-15 ENCOUNTER — Other Ambulatory Visit: Payer: 59

## 2019-09-15 NOTE — Telephone Encounter (Signed)
-----   Message from Lillia Corporal, RN sent at 09/12/2019  3:39 PM EDT ----- Regarding: first time chemo- Dr. [name] First time chemo Taxol/Carbo

## 2019-09-15 NOTE — Telephone Encounter (Signed)
Called pt to see how she did after treatment last week.  She reports doing OK except feeling a little tired & had a h/a yest that was relieved by tylenol.  Reminded to call with questions/concerns & she expressed understanding.

## 2019-09-16 ENCOUNTER — Encounter: Payer: Self-pay | Admitting: Hematology and Oncology

## 2019-09-18 ENCOUNTER — Ambulatory Visit: Payer: 59 | Admitting: Family Medicine

## 2019-10-03 ENCOUNTER — Telehealth: Payer: Self-pay | Admitting: Hematology and Oncology

## 2019-10-03 ENCOUNTER — Inpatient Hospital Stay: Payer: 59

## 2019-10-03 ENCOUNTER — Inpatient Hospital Stay: Payer: 59 | Attending: Gynecologic Oncology

## 2019-10-03 ENCOUNTER — Encounter: Payer: Self-pay | Admitting: Hematology and Oncology

## 2019-10-03 ENCOUNTER — Inpatient Hospital Stay (HOSPITAL_BASED_OUTPATIENT_CLINIC_OR_DEPARTMENT_OTHER): Payer: 59 | Admitting: Hematology and Oncology

## 2019-10-03 ENCOUNTER — Other Ambulatory Visit: Payer: Self-pay

## 2019-10-03 DIAGNOSIS — Z5111 Encounter for antineoplastic chemotherapy: Secondary | ICD-10-CM | POA: Diagnosis present

## 2019-10-03 DIAGNOSIS — Z6841 Body Mass Index (BMI) 40.0 and over, adult: Secondary | ICD-10-CM

## 2019-10-03 DIAGNOSIS — C541 Malignant neoplasm of endometrium: Secondary | ICD-10-CM | POA: Diagnosis not present

## 2019-10-03 DIAGNOSIS — Z23 Encounter for immunization: Secondary | ICD-10-CM | POA: Insufficient documentation

## 2019-10-03 LAB — CMP (CANCER CENTER ONLY)
ALT: 21 U/L (ref 0–44)
AST: 21 U/L (ref 15–41)
Albumin: 3.9 g/dL (ref 3.5–5.0)
Alkaline Phosphatase: 164 U/L — ABNORMAL HIGH (ref 38–126)
Anion gap: 11 (ref 5–15)
BUN: 9 mg/dL (ref 8–23)
CO2: 25 mmol/L (ref 22–32)
Calcium: 10.4 mg/dL — ABNORMAL HIGH (ref 8.9–10.3)
Chloride: 102 mmol/L (ref 98–111)
Creatinine: 0.75 mg/dL (ref 0.44–1.00)
GFR, Est AFR Am: >60 mL/min (ref >60)
GFR, Estimated: >60 mL/min (ref >60)
Glucose, Bld: 124 mg/dL — ABNORMAL HIGH (ref 70–99)
Potassium: 4.3 mmol/L (ref 3.5–5.1)
Sodium: 138 mmol/L (ref 135–145)
Total Bilirubin: 0.2 mg/dL — ABNORMAL LOW (ref 0.3–1.2)
Total Protein: 7.9 g/dL (ref 6.5–8.1)

## 2019-10-03 LAB — CBC WITH DIFFERENTIAL (CANCER CENTER ONLY)
Abs Immature Granulocytes: 0.09 10*3/uL — ABNORMAL HIGH (ref 0.00–0.07)
Basophils Absolute: 0 10*3/uL (ref 0.0–0.1)
Basophils Relative: 0 %
Eosinophils Absolute: 0 10*3/uL (ref 0.0–0.5)
Eosinophils Relative: 0 %
HCT: 40.8 % (ref 36.0–46.0)
Hemoglobin: 13.6 g/dL (ref 12.0–15.0)
Immature Granulocytes: 1 %
Lymphocytes Relative: 10 %
Lymphs Abs: 1.5 10*3/uL (ref 0.7–4.0)
MCH: 31.1 pg (ref 26.0–34.0)
MCHC: 33.3 g/dL (ref 30.0–36.0)
MCV: 93.4 fL (ref 80.0–100.0)
Monocytes Absolute: 0.4 10*3/uL (ref 0.1–1.0)
Monocytes Relative: 3 %
Neutro Abs: 13.4 10*3/uL — ABNORMAL HIGH (ref 1.7–7.7)
Neutrophils Relative %: 86 %
Platelet Count: 250 10*3/uL (ref 150–400)
RBC: 4.37 MIL/uL (ref 3.87–5.11)
RDW: 12.2 % (ref 11.5–15.5)
WBC Count: 15.4 10*3/uL — ABNORMAL HIGH (ref 4.0–10.5)
nRBC: 0 % (ref 0.0–0.2)

## 2019-10-03 MED ORDER — SODIUM CHLORIDE 0.9% FLUSH
10.0000 mL | INTRAVENOUS | Status: DC | PRN
  Administered 2019-10-03: 10 mL
  Filled 2019-10-03: qty 10

## 2019-10-03 MED ORDER — SODIUM CHLORIDE 0.9 % IV SOLN
750.0000 mg | Freq: Once | INTRAVENOUS | Status: AC
  Administered 2019-10-03: 750 mg via INTRAVENOUS
  Filled 2019-10-03: qty 75

## 2019-10-03 MED ORDER — SODIUM CHLORIDE 0.9 % IV SOLN
Freq: Once | INTRAVENOUS | Status: AC
  Filled 2019-10-03: qty 250

## 2019-10-03 MED ORDER — FAMOTIDINE IN NACL 20-0.9 MG/50ML-% IV SOLN
20.0000 mg | Freq: Once | INTRAVENOUS | Status: AC
  Administered 2019-10-03: 20 mg via INTRAVENOUS

## 2019-10-03 MED ORDER — PALONOSETRON HCL INJECTION 0.25 MG/5ML
INTRAVENOUS | Status: AC
  Filled 2019-10-03: qty 5

## 2019-10-03 MED ORDER — HEPARIN SOD (PORK) LOCK FLUSH 100 UNIT/ML IV SOLN
500.0000 [IU] | Freq: Once | INTRAVENOUS | Status: AC | PRN
  Administered 2019-10-03: 500 [IU]
  Filled 2019-10-03: qty 5

## 2019-10-03 MED ORDER — SODIUM CHLORIDE 0.9 % IV SOLN
140.0000 mg/m2 | Freq: Once | INTRAVENOUS | Status: AC
  Administered 2019-10-03: 324 mg via INTRAVENOUS
  Filled 2019-10-03: qty 54

## 2019-10-03 MED ORDER — SODIUM CHLORIDE 0.9% FLUSH
10.0000 mL | Freq: Once | INTRAVENOUS | Status: AC
  Administered 2019-10-03: 10 mL
  Filled 2019-10-03: qty 10

## 2019-10-03 MED ORDER — SODIUM CHLORIDE 0.9 % IV SOLN
150.0000 mg | Freq: Once | INTRAVENOUS | Status: AC
  Administered 2019-10-03: 150 mg via INTRAVENOUS
  Filled 2019-10-03: qty 150

## 2019-10-03 MED ORDER — DIPHENHYDRAMINE HCL 50 MG/ML IJ SOLN
INTRAMUSCULAR | Status: AC
  Filled 2019-10-03: qty 1

## 2019-10-03 MED ORDER — FAMOTIDINE IN NACL 20-0.9 MG/50ML-% IV SOLN
INTRAVENOUS | Status: AC
  Filled 2019-10-03: qty 50

## 2019-10-03 MED ORDER — PALONOSETRON HCL INJECTION 0.25 MG/5ML
0.2500 mg | Freq: Once | INTRAVENOUS | Status: AC
  Administered 2019-10-03: 0.25 mg via INTRAVENOUS

## 2019-10-03 MED ORDER — DIPHENHYDRAMINE HCL 50 MG/ML IJ SOLN
50.0000 mg | Freq: Once | INTRAMUSCULAR | Status: AC
  Administered 2019-10-03: 50 mg via INTRAVENOUS

## 2019-10-03 MED ORDER — SODIUM CHLORIDE 0.9 % IV SOLN
10.0000 mg | Freq: Once | INTRAVENOUS | Status: AC
  Administered 2019-10-03: 10 mg via INTRAVENOUS
  Filled 2019-10-03: qty 10

## 2019-10-03 NOTE — Progress Notes (Signed)
[place] OFFICE PROGRESS NOTE  Patient Care Team: Hali Marry, MD as PCP - General (Family Medicine) Druscilla Brownie, MD (Dermatology) Kristeen Miss, MD (Neurosurgery) Guss Bunde, MD (Obstetrics and Gynecology) Awanda Mink Craige Cotta, RN as Oncology Nurse Navigator (Oncology)  ASSESSMENT & PLAN:  Endometrial cancer [facility]) She tolerated cycle 1 of treatment very well without major side effects We will proceed with treatment as scheduled  BMI 40.0-44.9, adult [facility]) We have extensive discussions about the importance of weight loss, due to high risk of cancer recurrence in association between obesity and risk of breast cancer & uterine cancer She is highly motivated We discussed the importance of setting a goal and some lifestyle intervention to help her achieve her goal She have lost 5 pounds since last time I saw her We will continue with her adjusted dose treatment as before   No orders of the defined types were placed in this encounter.   All questions were answered. The patient knows to call the clinic with any problems, questions or concerns. The total time spent in the appointment was 20 minutes encounter with patients including review of chart and various tests results, discussions about plan of care and coordination of care plan   [name], MD 10/03/2019 10:43 AM  INTERVAL HISTORY: Please see below for problem oriented charting. She returns with her sister for further follow-up She is doing well No side effects from treatment so far Denies nausea, constipation or peripheral neuropathy  SUMMARY OF ONCOLOGIC HISTORY: Oncology History Overview Note  MSI Stable   Endometrial cancer ([place])  07/28/2019 Pathology Results   Endometrium, biopsy - ENDOMETRIOID CARCINOMA - SEE COMMENT Microscopic Comment Based on the biopsy, the carcinoma appears FIGO grade 1.   08/01/2019 Imaging   CT abdomen and pelvis No evidence of abdominal or pelvic metastatic  disease.   Small uterine fibroid.   Colonic diverticulosis, without radiographic evidence of diverticulitis.   Aortic Atherosclerosis (ICD10-I70.0).   08/14/2019 Surgery   Surgeon: Donaciano Eva  Operation: Robotic-assisted laparoscopic total hysterectomy with bilateral salpingoophorectomy, SLN biopsy     Operative Findings:  : 8cm uterus which appeared grossly normal, normal appearing tubes and ovaries, no suspicious lymph nodes. Brisk oozing from skin incisions.    09/03/2019 Cancer Staging   Staging form: Corpus Uteri - Carcinoma and Carcinosarcoma, AJCC 8th Edition - Pathologic stage from 09/03/2019: FIGO Stage IIIA (pT3a, pN0, cM0) - Signed by [name], MD on 09/03/2019   09/04/2019 Pathology Results   FINAL MICROSCOPIC DIAGNOSIS:   A. LYMPH NODE, SENTINEL RIGHT OBTURATOR PROXIMAL, BIOPSY:  -  No carcinoma identified in one lymph node (0/1)  -  See comment   B. LYMPH NODE, SENTINEL RIGHT OBTURATOR DISTAL, BIOPSY:  -  No carcinoma identified in one lymph node (0/1)  -  See comment   C. LYMPH NODE, SENTINEL LEFT EXTERNAL ILIAC, BIOPSY:  -  No carcinoma identified in one lymph node (0/1)  -  See comment   D. UTERUS AND BILATERAL ADNEXA, HYSTERECTOMY WITH SALPINGO-OOPHORECTOMY:   Uterus:  -  Endometrioid carcinoma, FIGO grade 1  -  Leiomyoma (1.8 cm; largest)  -  See oncology table and comment below   Cervix:  -  Cervix with squamous metaplasia  -  No carcinoma identified   Bilateral Ovaries:  -  Endometrioid carcinoma involving right ovary  -  No carcinoma involving left ovary  -  Endosalpingiosis   Bilateral Fallopian tubes:  -  No carcinoma identified  ONCOLOGY TABLE:   UTERUS, CARCINOMA OR CARCINOSARCOMA   Procedure: Total hysterectomy and bilateral salpingo-oophorectomy  Histologic type: Endometrioid carcinoma, NOS  Histologic Grade: FIGO grade 1  Myometrial invasion:       Depth of invasion: 3 mm       Myometrial thickness: 20 mm  Uterine Serosa  Involvement: Not identified  Cervical stromal involvement: Not identified  Extent of involvement of other organs: Right ovary  Lymphovascular invasion: Not identified  Regional Lymph Nodes:       Examined:      3 Sentinel                               0 non-sentinel                               3 total        Lymph nodes with metastasis: 0        Isolated tumor cells (<0.2 mm): 0        Micrometastasis:  (>0.2 mm and < 2.0 mm): 0        Macrometastasis: (>2.0 mm): 0        Extracapsular extension: N/A  Representative Tumor Block: D10  MMR / MSI testing: Pending  Pathologic Stage Classification (pTNM, AJCC 8th edition):  pT3a, pN0  Comments: Pancytokeratin performed on the lymph nodes is negative. The right ovary has a focus of carcinoma.     09/11/2019 Procedure   Placement of single lumen port a cath via right internal jugular vein. The catheter tip lies at the cavo-atrial junction. A power injectable port a cath was placed and is ready for immediate use.     09/12/2019 -  Chemotherapy   The patient had carboplatin and taxol for chemotherapy treatment.       REVIEW OF SYSTEMS:   Constitutional: Denies fevers, chills or abnormal weight loss Eyes: Denies blurriness of vision Ears, nose, mouth, throat, and face: Denies mucositis or sore throat Respiratory: Denies cough, dyspnea or wheezes Cardiovascular: Denies palpitation, chest discomfort or lower extremity swelling Gastrointestinal:  Denies nausea, heartburn or change in bowel habits Skin: Denies abnormal skin rashes Lymphatics: Denies new lymphadenopathy or easy bruising Neurological:Denies numbness, tingling or new weaknesses Behavioral/Psych: Mood is stable, no new changes  All other systems were reviewed with the patient and are negative.  I have reviewed the past medical history, past surgical history, social history and family history with the patient and they are unchanged from previous note.  ALLERGIES:  has No  Known Allergies.  MEDICATIONS:  Current Outpatient Medications  Medication Sig Dispense Refill  . polyethylene glycol (MIRALAX / GLYCOLAX) 17 g packet Take 17 g by mouth daily as needed.    . cholecalciferol (VITAMIN D) 1000 UNITS tablet Take 1,000 Units by mouth daily.      Marland Kitchen dexamethasone (DECADRON) 4 MG tablet Take 2 tabs at the night before and 2 tab the morning of chemotherapy, every 3 weeks, by mouth x 6 cycles 36 tablet 6  . DULoxetine (CYMBALTA) 60 MG capsule Take 1 capsule (60 mg total) by mouth daily. 90 capsule 1  . fluticasone (FLONASE) 50 MCG/ACT nasal spray Place 2 sprays into both nostrils daily as needed for allergies or rhinitis.    Marland Kitchen gabapentin (NEURONTIN) 300 MG capsule Take 2 capsules (600 mg total) by mouth daily. 180 capsule 2  . lidocaine-prilocaine (  EMLA) cream Apply to affected area once 30 g 3  . Multiple Vitamins-Minerals (PRESERVISION AREDS) CAPS Take 1 capsule by mouth 1 day or 1 dose.    . ondansetron (ZOFRAN) 8 MG tablet Take 1 tablet (8 mg total) by mouth every 8 (eight) hours as needed. Start on the third day after chemotherapy. 30 tablet 1  . prochlorperazine (COMPAZINE) 10 MG tablet Take 1 tablet (10 mg total) by mouth every 6 (six) hours as needed (Nausea or vomiting). 30 tablet 1  . rOPINIRole (REQUIP) 0.5 MG tablet TAKE 1 TABLET BY MOUTH AT  BEDTIME 90 tablet 3  . vitamin C (ASCORBIC ACID) 500 MG tablet Take 500 mg by mouth daily.       No current facility-administered medications for this visit.   Facility-Administered Medications Ordered in Other Visits  Medication Dose Route Frequency Provider Last Rate Last Admin  . CARBOplatin (PARAPLATIN) 750 mg in sodium chloride 0.9 % 250 mL chemo infusion  750 mg Intravenous Once [name], Ni, MD      . heparin lock flush 100 unit/mL  500 Units Intracatheter Once PRN [name], Ni, MD      . PACLitaxel (TAXOL) 324 mg in sodium chloride 0.9 % 500 mL chemo infusion (> 63m/m2)  140 mg/m2 (Treatment Plan Recorded)  Intravenous Once Gorsuch, Ni, MD      . sodium chloride flush (NS) 0.9 % injection 10 mL  10 mL Intracatheter PRN GAlvy Bimler Ni, MD        PHYSICAL EXAMINATION: ECOG PERFORMANCE STATUS: 0 - Asymptomatic  Vitals:   10/03/19 0820  BP: 130/90  Pulse: 98  Resp: 18  Temp: (!) 97.5 F (36.4 C)  SpO2: 97%   Filed Weights   10/03/19 0820  Weight: 251 lb 9.6 oz (114.1 kg)    GENERAL:alert, no distress and comfortable SKIN: skin color, texture, turgor are normal, no rashes or significant lesions EYES: normal, Conjunctiva are pink and non-injected, sclera clear OROPHARYNX:no exudate, no erythema and lips, buccal mucosa, and tongue normal  NECK: supple, thyroid normal size, non-tender, without nodularity LYMPH:  no palpable lymphadenopathy in the cervical, axillary or inguinal LUNGS: clear to auscultation and percussion with normal breathing effort HEART: regular rate & rhythm and no murmurs and no lower extremity edema ABDOMEN:abdomen soft, non-tender and normal bowel sounds Musculoskeletal:no cyanosis of digits and no clubbing  NEURO: alert & oriented x 3 with fluent speech, no focal motor/sensory deficits  LABORATORY DATA:  I have reviewed the data as listed    Component Value Date/Time   NA 138 10/03/2019 0818   NA 141 01/20/2014 1604   K 4.3 10/03/2019 0818   K 4.2 01/20/2014 1604   CL 102 10/03/2019 0818   CO2 25 10/03/2019 0818   CO2 28 01/20/2014 1604   GLUCOSE 124 (H) 10/03/2019 0818   GLUCOSE 99 01/20/2014 1604   BUN 9 10/03/2019 0818   BUN 11 07/31/2019 0000   BUN 14.6 01/20/2014 1604   CREATININE 0.75 10/03/2019 0818   CREATININE 0.78 03/24/2019 1041   CREATININE 0.8 01/20/2014 1604   CALCIUM 10.4 (H) 10/03/2019 0818   CALCIUM 9.8 01/20/2014 1604   PROT 7.9 10/03/2019 0818   PROT 7.3 01/20/2014 1604   ALBUMIN 3.9 10/03/2019 0818   ALBUMIN 4.0 01/20/2014 1604   AST 21 10/03/2019 0818   AST 22 01/20/2014 1604   ALT 21 10/03/2019 0818   ALT 20 01/20/2014 1604    ALKPHOS 164 (H) 10/03/2019 0818   ALKPHOS 134 01/20/2014 1604  BILITOT 0.2 (L) 10/03/2019 0818   BILITOT <0.20 01/20/2014 1604   GFRNONAA >60 10/03/2019 0818   GFRNONAA 81 03/24/2019 1041   GFRAA >60 10/03/2019 0818   GFRAA 94 03/24/2019 1041    No results found for: SPEP, UPEP  Lab Results  Component Value Date   WBC 15.4 (H) 10/03/2019   NEUTROABS 13.4 (H) 10/03/2019   HGB 13.6 10/03/2019   HCT 40.8 10/03/2019   MCV 93.4 10/03/2019   PLT 250 10/03/2019      Chemistry      Component Value Date/Time   NA 138 10/03/2019 0818   NA 141 01/20/2014 1604   K 4.3 10/03/2019 0818   K 4.2 01/20/2014 1604   CL 102 10/03/2019 0818   CO2 25 10/03/2019 0818   CO2 28 01/20/2014 1604   BUN 9 10/03/2019 0818   BUN 11 07/31/2019 0000   BUN 14.6 01/20/2014 1604   CREATININE 0.75 10/03/2019 0818   CREATININE 0.78 03/24/2019 1041   CREATININE 0.8 01/20/2014 1604      Component Value Date/Time   CALCIUM 10.4 (H) 10/03/2019 0818   CALCIUM 9.8 01/20/2014 1604   ALKPHOS 164 (H) 10/03/2019 0818   ALKPHOS 134 01/20/2014 1604   AST 21 10/03/2019 0818   AST 22 01/20/2014 1604   ALT 21 10/03/2019 0818   ALT 20 01/20/2014 1604   BILITOT 0.2 (L) 10/03/2019 0818   BILITOT <0.20 01/20/2014 1604

## 2019-10-03 NOTE — Assessment & Plan Note (Signed)
She tolerated cycle 1 of treatment very well without major side effects We will proceed with treatment as scheduled

## 2019-10-03 NOTE — Telephone Encounter (Signed)
Scheduled appts per 9/3 sch msg. Gave pt a print out of AVS.  °

## 2019-10-03 NOTE — Patient Instructions (Signed)
[facility] Discharge Instructions for Patients Receiving Chemotherapy  Today you received the following chemotherapy agents: Taxol, Carboplatin  To help prevent nausea and vomiting after your treatment, we encourage you to take your nausea medication as directed.   If you develop nausea and vomiting that is not controlled by your nausea medication, call the clinic.   BELOW ARE SYMPTOMS THAT SHOULD BE REPORTED IMMEDIATELY:  *FEVER GREATER THAN 100.5 F  *CHILLS WITH OR WITHOUT FEVER  NAUSEA AND VOMITING THAT IS NOT CONTROLLED WITH YOUR NAUSEA MEDICATION  *UNUSUAL SHORTNESS OF BREATH  *UNUSUAL BRUISING OR BLEEDING  TENDERNESS IN MOUTH AND THROAT WITH OR WITHOUT PRESENCE OF ULCERS  *URINARY PROBLEMS  *BOWEL PROBLEMS  UNUSUAL RASH Items with * indicate a potential emergency and should be followed up as soon as possible.  Feel free to call the clinic should you have any questions or concerns. The clinic phone number is [phone].  Please show the CHEMO ALERT CARD at check-in to the Emergency Department and triage nurse.  Paclitaxel injection What is this medicine? PACLITAXEL (PAK li TAX el) is a chemotherapy drug. It targets fast dividing cells, like cancer cells, and causes these cells to die. This medicine is used to treat ovarian cancer, breast cancer, lung cancer, Kaposi's sarcoma, and other cancers. This medicine may be used for other purposes; ask your health care provider or pharmacist if you have questions. COMMON BRAND NAME(S): Onxol, Taxol What should I tell my health care provider before I take this medicine? They need to know if you have any of these conditions:  history of irregular heartbeat  liver disease  low blood counts, like low white cell, platelet, or red cell counts  lung or breathing disease, like asthma  tingling of the fingers or toes, or other nerve disorder  an unusual or allergic reaction to paclitaxel, alcohol,  polyoxyethylated castor oil, other chemotherapy, other medicines, foods, dyes, or preservatives  pregnant or trying to get pregnant  breast-feeding How should I use this medicine? This drug is given as an infusion into a vein. It is administered in a hospital or clinic by a specially trained health care professional. Talk to your pediatrician regarding the use of this medicine in children. Special care may be needed. Overdosage: If you think you have taken too much of this medicine contact a poison control center or emergency room at once. NOTE: This medicine is only for you. Do not share this medicine with others. What if I miss a dose? It is important not to miss your dose. Call your doctor or health care professional if you are unable to keep an appointment. What may interact with this medicine? Do not take this medicine with any of the following medications:  disulfiram  metronidazole This medicine may also interact with the following medications:  antiviral medicines for hepatitis, HIV or AIDS  certain antibiotics like erythromycin and clarithromycin  certain medicines for fungal infections like ketoconazole and itraconazole  certain medicines for seizures like carbamazepine, phenobarbital, phenytoin  gemfibrozil  nefazodone  rifampin  St. John's wort This list may not describe all possible interactions. Give your health care provider a list of all the medicines, herbs, non-prescription drugs, or dietary supplements you use. Also tell them if you smoke, drink alcohol, or use illegal drugs. Some items may interact with your medicine. What should I watch for while using this medicine? Your condition will be monitored carefully while you are receiving this medicine. You will need important blood   work done while you are taking this medicine. This medicine can cause serious allergic reactions. To reduce your risk you will need to take other medicine(s) before treatment with this  medicine. If you experience allergic reactions like skin rash, itching or hives, swelling of the face, lips, or tongue, tell your doctor or health care professional right away. In some cases, you may be given additional medicines to help with side effects. Follow all directions for their use. This drug may make you feel generally unwell. This is not uncommon, as chemotherapy can affect healthy cells as well as cancer cells. Report any side effects. Continue your course of treatment even though you feel ill unless your doctor tells you to stop. Call your doctor or health care professional for advice if you get a fever, chills or sore throat, or other symptoms of a cold or flu. Do not treat yourself. This drug decreases your body's ability to fight infections. Try to avoid being around people who are sick. This medicine may increase your risk to bruise or bleed. Call your doctor or health care professional if you notice any unusual bleeding. Be careful brushing and flossing your teeth or using a toothpick because you may get an infection or bleed more easily. If you have any dental work done, tell your dentist you are receiving this medicine. Avoid taking products that contain aspirin, acetaminophen, ibuprofen, naproxen, or ketoprofen unless instructed by your doctor. These medicines may hide a fever. Do not become pregnant while taking this medicine. Women should inform their doctor if they wish to become pregnant or think they might be pregnant. There is a potential for serious side effects to an unborn child. Talk to your health care professional or pharmacist for more information. Do not breast-feed an infant while taking this medicine. Men are advised not to father a child while receiving this medicine. This product may contain alcohol. Ask your pharmacist or healthcare provider if this medicine contains alcohol. Be sure to tell all healthcare providers you are taking this medicine. Certain medicines,  like metronidazole and disulfiram, can cause an unpleasant reaction when taken with alcohol. The reaction includes flushing, headache, nausea, vomiting, sweating, and increased thirst. The reaction can last from 30 minutes to several hours. What side effects may I notice from receiving this medicine? Side effects that you should report to your doctor or health care professional as soon as possible:  allergic reactions like skin rash, itching or hives, swelling of the face, lips, or tongue  breathing problems  changes in vision  fast, irregular heartbeat  high or low blood pressure  mouth sores  pain, tingling, numbness in the hands or feet  signs of decreased platelets or bleeding - bruising, pinpoint red spots on the skin, black, tarry stools, blood in the urine  signs of decreased red blood cells - unusually weak or tired, feeling faint or lightheaded, falls  signs of infection - fever or chills, cough, sore throat, pain or difficulty passing urine  signs and symptoms of liver injury like dark yellow or brown urine; general ill feeling or flu-like symptoms; light-colored stools; loss of appetite; nausea; right upper belly pain; unusually weak or tired; yellowing of the eyes or skin  swelling of the ankles, feet, hands  unusually slow heartbeat Side effects that usually do not require medical attention (report to your doctor or health care professional if they continue or are bothersome):  diarrhea  hair loss  loss of appetite  muscle or joint pain    nausea, vomiting  pain, redness, or irritation at site where injected  tiredness This list may not describe all possible side effects. Call your doctor for medical advice about side effects. You may report side effects to FDA at 1-800-FDA-1088. Where should I keep my medicine? This drug is given in a hospital or clinic and will not be stored at home. NOTE: This sheet is a summary. It may not cover all possible information.  If you have questions about this medicine, talk to your doctor, pharmacist, or health care provider.  2020 Elsevier/Gold Standard (2016-09-19 13:14:55)  Carboplatin injection What is this medicine? CARBOPLATIN (KAR boe pla tin) is a chemotherapy drug. It targets fast dividing cells, like cancer cells, and causes these cells to die. This medicine is used to treat ovarian cancer and many other cancers. This medicine may be used for other purposes; ask your health care provider or pharmacist if you have questions. COMMON BRAND NAME(S): Paraplatin What should I tell my health care provider before I take this medicine? They need to know if you have any of these conditions:  blood disorders  hearing problems  kidney disease  recent or ongoing radiation therapy  an unusual or allergic reaction to carboplatin, cisplatin, other chemotherapy, other medicines, foods, dyes, or preservatives  pregnant or trying to get pregnant  breast-feeding How should I use this medicine? This drug is usually given as an infusion into a vein. It is administered in a hospital or clinic by a specially trained health care professional. Talk to your pediatrician regarding the use of this medicine in children. Special care may be needed. Overdosage: If you think you have taken too much of this medicine contact a poison control center or emergency room at once. NOTE: This medicine is only for you. Do not share this medicine with others. What if I miss a dose? It is important not to miss a dose. Call your doctor or health care professional if you are unable to keep an appointment. What may interact with this medicine?  medicines for seizures  medicines to increase blood counts like filgrastim, pegfilgrastim, sargramostim  some antibiotics like amikacin, gentamicin, neomycin, streptomycin, tobramycin  vaccines Talk to your doctor or health care professional before taking any of these  medicines:  acetaminophen  aspirin  ibuprofen  ketoprofen  naproxen This list may not describe all possible interactions. Give your health care provider a list of all the medicines, herbs, non-prescription drugs, or dietary supplements you use. Also tell them if you smoke, drink alcohol, or use illegal drugs. Some items may interact with your medicine. What should I watch for while using this medicine? Your condition will be monitored carefully while you are receiving this medicine. You will need important blood work done while you are taking this medicine. This drug may make you feel generally unwell. This is not uncommon, as chemotherapy can affect healthy cells as well as cancer cells. Report any side effects. Continue your course of treatment even though you feel ill unless your doctor tells you to stop. In some cases, you may be given additional medicines to help with side effects. Follow all directions for their use. Call your doctor or health care professional for advice if you get a fever, chills or sore throat, or other symptoms of a cold or flu. Do not treat yourself. This drug decreases your body's ability to fight infections. Try to avoid being around people who are sick. This medicine may increase your risk to bruise or bleed.   Call your doctor or health care professional if you notice any unusual bleeding. Be careful brushing and flossing your teeth or using a toothpick because you may get an infection or bleed more easily. If you have any dental work done, tell your dentist you are receiving this medicine. Avoid taking products that contain aspirin, acetaminophen, ibuprofen, naproxen, or ketoprofen unless instructed by your doctor. These medicines may hide a fever. Do not become pregnant while taking this medicine. Women should inform their doctor if they wish to become pregnant or think they might be pregnant. There is a potential for serious side effects to an unborn child. Talk  to your health care professional or pharmacist for more information. Do not breast-feed an infant while taking this medicine. What side effects may I notice from receiving this medicine? Side effects that you should report to your doctor or health care professional as soon as possible:  allergic reactions like skin rash, itching or hives, swelling of the face, lips, or tongue  signs of infection - fever or chills, cough, sore throat, pain or difficulty passing urine  signs of decreased platelets or bleeding - bruising, pinpoint red spots on the skin, black, tarry stools, nosebleeds  signs of decreased red blood cells - unusually weak or tired, fainting spells, lightheadedness  breathing problems  changes in hearing  changes in vision  chest pain  high blood pressure  low blood counts - This drug may decrease the number of white blood cells, red blood cells and platelets. You may be at increased risk for infections and bleeding.  nausea and vomiting  pain, swelling, redness or irritation at the injection site  pain, tingling, numbness in the hands or feet  problems with balance, talking, walking  trouble passing urine or change in the amount of urine Side effects that usually do not require medical attention (report to your doctor or health care professional if they continue or are bothersome):  hair loss  loss of appetite  metallic taste in the mouth or changes in taste This list may not describe all possible side effects. Call your doctor for medical advice about side effects. You may report side effects to FDA at 1-800-FDA-1088. Where should I keep my medicine? This drug is given in a hospital or clinic and will not be stored at home. NOTE: This sheet is a summary. It may not cover all possible information. If you have questions about this medicine, talk to your doctor, pharmacist, or health care provider.  2020 Elsevier/Gold Standard (2007-04-23 14:38:05)  

## 2019-10-03 NOTE — Patient Instructions (Signed)

## 2019-10-03 NOTE — Assessment & Plan Note (Signed)
We have extensive discussions about the importance of weight loss, due to high risk of cancer recurrence in association between obesity and risk of breast cancer & uterine cancer She is highly motivated We discussed the importance of setting a goal and some lifestyle intervention to help her achieve her goal She have lost 5 pounds since last time I saw her We will continue with her adjusted dose treatment as before

## 2019-10-03 NOTE — Progress Notes (Signed)
   Covid-19 Vaccination Clinic  Name:  Patricia Waters    MRN: [id number] DOB: 09-10-1955  10/03/2019  [name] was observed post Covid-19 immunization for 15 minutes without incident. She was provided with Vaccine Information Sheet and instruction to access the V-Safe system.   [name] was instructed to call 911 with any severe reactions post vaccine: Marland Kitchen Difficulty breathing  . Swelling of face and throat  . A fast heartbeat  . A bad rash all over body  . Dizziness and weakness   Immunizations Administered    Name Date Dose VIS Date Route   Pfizer COVID-19 Vaccine 10/03/2019  3:13 PM 0.3 mL 03/26/2018 Intramuscular   Manufacturer: [place]   Lot: [id number]   [place]: [id number]

## 2019-10-03 NOTE — Progress Notes (Signed)
   Covid-19 Vaccination Clinic  Name:  Patricia Waters    MRN: [id number] DOB: 05/11/1955  10/03/2019  [name] was observed post Covid-19 immunization for 15 minutes without incident. She was provided with Vaccine Information Sheet and instruction to access the V-Safe system.   [name] was instructed to call 911 with any severe reactions post vaccine: Marland Kitchen Difficulty breathing  . Swelling of face and throat  . A fast heartbeat  . A bad rash all over body  . Dizziness and weakness   Immunizations Administered    Name Date Dose VIS Date Route   Pfizer COVID-19 Vaccine 10/03/2019  3:13 PM 0.3 mL 03/26/2018 Intramuscular   Manufacturer: [place]   Lot: [id number]   [place]: [id number]

## 2019-10-06 ENCOUNTER — Other Ambulatory Visit: Payer: Self-pay

## 2019-10-06 ENCOUNTER — Encounter (HOSPITAL_COMMUNITY): Payer: Self-pay | Admitting: *Deleted

## 2019-10-06 ENCOUNTER — Emergency Department (HOSPITAL_COMMUNITY)
Admission: EM | Admit: 2019-10-06 | Discharge: 2019-10-06 | Disposition: A | Discharge status: Home / Self Care | Payer: 59 | Attending: Emergency Medicine | Admitting: Emergency Medicine

## 2019-10-06 DIAGNOSIS — Z853 Personal history of malignant neoplasm of breast: Secondary | ICD-10-CM | POA: Diagnosis not present

## 2019-10-06 DIAGNOSIS — L739 Follicular disorder, unspecified: Secondary | ICD-10-CM

## 2019-10-06 DIAGNOSIS — Z87891 Personal history of nicotine dependence: Secondary | ICD-10-CM | POA: Insufficient documentation

## 2019-10-06 DIAGNOSIS — L662 Folliculitis decalvans: Secondary | ICD-10-CM | POA: Insufficient documentation

## 2019-10-06 DIAGNOSIS — Z8542 Personal history of malignant neoplasm of other parts of uterus: Secondary | ICD-10-CM | POA: Diagnosis not present

## 2019-10-06 DIAGNOSIS — R21 Rash and other nonspecific skin eruption: Secondary | ICD-10-CM | POA: Diagnosis present

## 2019-10-06 DIAGNOSIS — Z79899 Other long term (current) drug therapy: Secondary | ICD-10-CM | POA: Diagnosis not present

## 2019-10-06 NOTE — ED Provider Notes (Signed)
[facility] DEPT Provider Note   CSN: [id number] Arrival date & time: 10/06/19  9326     History Chief Complaint  Patient presents with  . Rash    Patricia Waters is a 65 y.o. female with a past medical history of endometrial cancer currently on chemotherapy presenting to the ED with a chief complaint of rash.  Had her second chemotherapy session on 10/03/2019.  The day after that started noticing a rash to the scalp that worsened yesterday and this morning despite taking 1 dose of antihistamine last night.  Did not have this rash with first chemotherapy session.  No new soaps, lotions, detergents or environmental exposures.  Denies any face or lip swelling, shortness of breath.  Rash only involves the scalp.  HPI     Past Medical History:  Diagnosis Date  . Arthritis   . Breast cancer ([place])    at age 35  . Family history of breast cancer   . Family history of colon cancer   . Family history of prostate cancer in father   . Family history of uterine cancer   . History of radiation therapy   . Lymph edema    right arm   . PONV (postoperative nausea and vomiting)   . Pre-diabetes   . Sleep apnea    pt has nose pillows for cpap per pt - will bring DOS   . Vertigo     Patient Active Problem List   Diagnosis Date Noted  . Family history of colon cancer   . Family history of breast cancer   . Family history of uterine cancer   . Family history of prostate cancer in father   . Endometrial cancer ([place]) 08/14/2019  . Caregiver stress 03/24/2019  . Medial epicondylitis, right 10/11/2018  . Abnormal weight gain 09/17/2018  . Hx of bilateral mastectomy 11/25/2015  . History of breast cancer in female 07/21/2014  . Family history of breast cancer in first degree relative 07/21/2014  . 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 Lymphedema 10/23/2013  . BMI 40.0-44.9, adult ([place]) 09/25/2013  . Periodic limb movement 06/13/2013   . Plantar fasciitis, bilateral 06/04/2013  . RLS (restless legs syndrome) 03/19/2013  . Central sleep apnea 03/19/2013  . Sleep apnea, obstructive 03/19/2013  . IFG (impaired fasting glucose) 12/14/2011  . 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 POSTMENOPAUSAL STATUS 01/10/2010  . BENIGN POSITIONAL VERTIGO 11/08/2009  . SPINAL STENOSIS, LUMBAR 07/06/2009  . OTH NONSPC ABN FINDNG RAD&OTH EXM BODY STRUCTURE 03/30/2009  . Low back pain 04/23/2007    Past Surgical History:  Procedure Laterality Date  . BACK SURGERY    . BREAST LUMPECTOMY  1994   right  . CERVICAL FUSION  1995   C5-C7  . IR IMAGING GUIDED PORT INSERTION  09/11/2019  . MASTECTOMY  2005   Bilat   . ROBOTIC ASSISTED TOTAL HYSTERECTOMY WITH BILATERAL SALPINGO OOPHERECTOMY N/A 08/14/2019   Procedure: XI ROBOTIC ASSISTED TOTAL HYSTERECTOMY WITH BILATERAL SALPINGO OOPHORECTOMY;  Surgeon: [name], MD;  Location: [facility] ORS;  Service: Gynecology;  Laterality: N/A;  . SENTINEL NODE BIOPSY N/A 08/14/2019   Procedure: SENTINEL NODE BIOPSY;  Surgeon: [name], MD;  Location: [facility] OR[facility];  Service: Gynecology;  Laterality: N/A;     OB History    Gravida  0   Para  0   Term  0   Preterm  0   AB  0  Living  0     SAB  0   TAB  0   Ectopic  0   Multiple  0   Live Births  0           Family History  Problem Relation Age of Onset  . Stroke Mother   . Hyperlipidemia Mother   . Skin cancer Mother   . Breast cancer Mother 81       ? recurrence at 71  . Endometrial cancer Mother 55  . Diabetes Father   . Hyperlipidemia Father   . Heart attack Father 44  . Colon cancer Father 73  . Prostate cancer Father 1       [place] 9  . Diabetes Paternal Grandmother   . Heart attack Paternal Grandmother   . Rheum arthritis Paternal Grandmother   . Hyperlipidemia Sister   . Heart attack Paternal Grandfather   . Heart attack Maternal Grandfather   . Heart attack Maternal Grandmother   . Skin cancer Maternal Grandmother    . Rheum arthritis Maternal Grandmother   . Breast cancer Sister 6  . Lymphoma Maternal Aunt 65  . Kidney cancer Maternal Aunt 60  . Cervical cancer Cousin 36    Social History   Tobacco Use  . Smoking status: Former Smoker    Types: Cigarettes    Quit date: 10/31/2006    Years since quitting: 12.9  . Smokeless tobacco: Never Used  Vaping Use  . Vaping Use: Never used  Substance Use Topics  . Alcohol use: Not Currently  . Drug use: No    Home Medications Prior to Admission medications   Medication Sig Start Date End Date Taking? Authorizing Provider  cholecalciferol (VITAMIN D) 1000 UNITS tablet Take 1,000 Units by mouth daily.      [provider]  dexamethasone (DECADRON) 4 MG tablet Take 2 tabs at the night before and 2 tab the morning of chemotherapy, every 3 weeks, by mouth x 6 cycles 09/04/19   [name], MD  DULoxetine (CYMBALTA) 60 MG capsule Take 1 capsule (60 mg total) by mouth daily. 06/20/19   Hali Marry, MD  fluticasone (FLONASE) 50 MCG/ACT nasal spray Place 2 sprays into both nostrils daily as needed for allergies or rhinitis.    [provider]  gabapentin (NEURONTIN) 300 MG capsule Take 2 capsules (600 mg total) by mouth daily. 06/20/19   Hali Marry, MD  lidocaine-prilocaine (EMLA) cream Apply to affected area once 09/04/19   [name], MD  Multiple Vitamins-Minerals (PRESERVISION AREDS) CAPS Take 1 capsule by mouth 1 day or 1 dose.    [provider]  ondansetron (ZOFRAN) 8 MG tablet Take 1 tablet (8 mg total) by mouth every 8 (eight) hours as needed. Start on the third day after chemotherapy. 09/04/19   [name], MD  polyethylene glycol (MIRALAX / GLYCOLAX) 17 g packet Take 17 g by mouth daily as needed.    [provider]  prochlorperazine (COMPAZINE) 10 MG tablet Take 1 tablet (10 mg total) by mouth every 6 (six) hours as needed (Nausea or vomiting). 09/04/19   [name], MD  rOPINIRole (REQUIP) 0.5 MG  tablet TAKE 1 TABLET BY MOUTH AT  BEDTIME 08/15/19   Hali Marry, MD  vitamin C (ASCORBIC ACID) 500 MG tablet Take 500 mg by mouth daily.      [provider]    Allergies    Patient has no known allergies.  Review of Systems  Review of Systems  Constitutional: Negative for chills and fever.  HENT: Negative for facial swelling.   Respiratory: Negative for shortness of breath.   Skin: Positive for rash.  Neurological: Negative for weakness.    Physical Exam Updated Vital Signs BP 137/88 (BP Location: Left Arm)   Pulse (!) 114   Temp 98.8 F (37.1 C) (Oral)   Resp 20   SpO2 95%   Physical Exam Vitals and nursing note reviewed.  Constitutional:      General: She is not in acute distress.    Appearance: She is well-developed. She is not diaphoretic.  HENT:     Head: Normocephalic and atraumatic.  Eyes:     General: No scleral icterus.    Conjunctiva/sclera: Conjunctivae normal.  Pulmonary:     Effort: Pulmonary effort is normal. No respiratory distress.  Musculoskeletal:     Cervical back: Normal range of motion.  Skin:    Findings: Rash (scalp) present.  Neurological:     Mental Status: She is alert.       ED Results / Procedures / Treatments   Labs (all labs ordered are listed, but only abnormal results are displayed) Labs Reviewed - No data to display  EKG None  Radiology No results found.  Procedures Procedures (including critical care time)  Medications Ordered in ED Medications - No data to display  ED Course  I have reviewed the triage vital signs and the nursing notes.  Pertinent labs & imaging results that were available during my care of the patient were reviewed by me and considered in my medical decision making (see chart for details).  Clinical Course as of Oct 05 1153  Mon Oct 06, 2019  1135 Spoke to on-call oncologist.  He does not feel that this is a reaction to the chemotherapy but could be due to folliculitis.    [HK]    Clinical Course User Index [HK] Delia Heady, PA-C   MDM Rules/Calculators/A&P                          Pt has a patent airway without stridor and is handling secretions without difficulty; no angioedema. No blisters, no pustules, no warmth, no draining sinus tracts, no superficial abscesses, no bullous impetigo, no vesicles, no desquamation, no target lesions with dusky purpura or a central bulla. Not tender to touch. No concern for superimposed infection. No concern for SJS, TEN, TSS, tick borne illness, syphilis or other life-threatening condition.  Patient with rash after clipping hair 1 week ago.  Suspect this is folliculitis.  Per oncology, does not appear to be related to her chemo.  At this time do not believe that antibiotics are warranted as it appears more inflamed than infected.  We will have her continue soap and water, warm compresses and follow-up with PCP.  Strict return precautions given, given precautions about trimming and shaving to prevent future incidences.   Patient is hemodynamically stable, in NAD, and able to ambulate in the ED. Evaluation does not show pathology that would require ongoing emergent intervention or inpatient treatment. I explained the diagnosis to the patient. Pain has been managed and has no complaints prior to discharge. Patient is comfortable with above plan and is stable for discharge at this time. All questions were answered prior to disposition. Strict return precautions for returning to the ED were discussed. Encouraged follow up with PCP.   An After Visit Summary was printed and given to the  patient.   Portions of this note were generated with [employer]. Dictation errors may occur despite best attempts at proofreading.  Final Clinical Impression(s) / ED Diagnoses Final diagnoses:  Folliculitis    Rx / DC Orders ED Discharge Orders    None       Delia Heady, PA-C 10/06/19 1156    Daleen Bo, MD 10/06/19  [phone]

## 2019-10-06 NOTE — ED Triage Notes (Signed)
Rash on head after receiving Chemo on Friday, Last night, this morning worse. Only head involved.

## 2019-10-06 NOTE — Discharge Instructions (Addendum)
Apply warm compresses to the area. Follow-up with your primary care provider. Return to the ER for worsening redness, pustules, fever.

## 2019-10-06 NOTE — ED Provider Notes (Signed)
°  Face-to-face evaluation   History: She presents for evaluation of rash for 2 days on her scalp.  She had chemo, second dose, 2 days ago.  She had her scalp clipped closely at a beauty product, about a week ago.  She denies fever, vomiting or dizziness  Physical exam: Alert patient who is obese.  Scalp notable for mild folliculitis, with just a few small pustules and scattered red small palpable rash.  MDM-patient with clinical evidence for mild folliculitis, probably initiated by the clipping of her scalp hair, which was done a week ago.  Doubt deep infection, chemo reaction or metabolic instability.  This mild folliculitis appears amenable to symptomatic treatment, with soap cleansing and warm compresses, and avoiding additive topical treatments.  Medical screening examination/treatment/procedure(s) were conducted as a shared visit with non-physician practitioner(s) and myself.  I personally evaluated the patient during the encounter    Daleen Bo, MD 10/06/19 [phone]

## 2019-10-07 ENCOUNTER — Encounter: Payer: Self-pay | Admitting: Hematology and Oncology

## 2019-10-07 ENCOUNTER — Telehealth: Payer: Self-pay | Admitting: *Deleted

## 2019-10-07 NOTE — Telephone Encounter (Signed)
Per Dr.Gorsuch, called pt to f/u about ED visit for scalp lesions. Pt stated her and ED physician agreed that the folliulitis occurred from shaving hair off head when her hair was coming out. Pt has been applying warm compresses to scalp to help with irritation. Pt stated she had a small amount of open areas that had small amount of pus coming from them, are beginning to close up. Asked if they can use T-Gel shampoo to aid in healing. Per Dr.Gorsuch, if warm compresses are working, suggest not to do any shampoos. Pt verbalized understanding

## 2019-10-24 ENCOUNTER — Inpatient Hospital Stay: Payer: 59

## 2019-10-24 ENCOUNTER — Encounter: Payer: Self-pay | Admitting: Hematology and Oncology

## 2019-10-24 ENCOUNTER — Inpatient Hospital Stay (HOSPITAL_BASED_OUTPATIENT_CLINIC_OR_DEPARTMENT_OTHER): Payer: 59 | Admitting: Hematology and Oncology

## 2019-10-24 ENCOUNTER — Other Ambulatory Visit: Payer: Self-pay

## 2019-10-24 DIAGNOSIS — C541 Malignant neoplasm of endometrium: Secondary | ICD-10-CM

## 2019-10-24 DIAGNOSIS — R519 Headache, unspecified: Secondary | ICD-10-CM | POA: Diagnosis not present

## 2019-10-24 DIAGNOSIS — G620 Drug-induced polyneuropathy: Secondary | ICD-10-CM | POA: Diagnosis not present

## 2019-10-24 DIAGNOSIS — Z5111 Encounter for antineoplastic chemotherapy: Secondary | ICD-10-CM | POA: Diagnosis not present

## 2019-10-24 DIAGNOSIS — T451X5A Adverse effect of antineoplastic and immunosuppressive drugs, initial encounter: Secondary | ICD-10-CM | POA: Diagnosis not present

## 2019-10-24 LAB — CBC WITH DIFFERENTIAL (CANCER CENTER ONLY)
Abs Immature Granulocytes: 0.03 10*3/uL (ref 0.00–0.07)
Basophils Absolute: 0 10*3/uL (ref 0.0–0.1)
Basophils Relative: 0 %
Eosinophils Absolute: 0 10*3/uL (ref 0.0–0.5)
Eosinophils Relative: 0 %
HCT: 36.6 % (ref 36.0–46.0)
Hemoglobin: 12.4 g/dL (ref 12.0–15.0)
Immature Granulocytes: 0 %
Lymphocytes Relative: 11 %
Lymphs Abs: 1 10*3/uL (ref 0.7–4.0)
MCH: 32.3 pg (ref 26.0–34.0)
MCHC: 33.9 g/dL (ref 30.0–36.0)
MCV: 95.3 fL (ref 80.0–100.0)
Monocytes Absolute: 0.3 10*3/uL (ref 0.1–1.0)
Monocytes Relative: 4 %
Neutro Abs: 7.5 10*3/uL (ref 1.7–7.7)
Neutrophils Relative %: 85 %
Platelet Count: 326 10*3/uL (ref 150–400)
RBC: 3.84 MIL/uL — ABNORMAL LOW (ref 3.87–5.11)
RDW: 13.7 % (ref 11.5–15.5)
WBC Count: 8.8 10*3/uL (ref 4.0–10.5)
nRBC: 0 % (ref 0.0–0.2)

## 2019-10-24 LAB — CMP (CANCER CENTER ONLY)
ALT: 34 U/L (ref 0–44)
AST: 25 U/L (ref 15–41)
Albumin: 3.7 g/dL (ref 3.5–5.0)
Alkaline Phosphatase: 161 U/L — ABNORMAL HIGH (ref 38–126)
Anion gap: 8 (ref 5–15)
BUN: 12 mg/dL (ref 8–23)
CO2: 27 mmol/L (ref 22–32)
Calcium: 9.9 mg/dL (ref 8.9–10.3)
Chloride: 103 mmol/L (ref 98–111)
Creatinine: 0.76 mg/dL (ref 0.44–1.00)
GFR, Est AFR Am: >60 mL/min (ref >60)
GFR, Estimated: >60 mL/min (ref >60)
Glucose, Bld: 127 mg/dL — ABNORMAL HIGH (ref 70–99)
Potassium: 4.3 mmol/L (ref 3.5–5.1)
Sodium: 138 mmol/L (ref 135–145)
Total Bilirubin: 0.3 mg/dL (ref 0.3–1.2)
Total Protein: 7.7 g/dL (ref 6.5–8.1)

## 2019-10-24 MED ORDER — SODIUM CHLORIDE 0.9 % IV SOLN
150.0000 mg | Freq: Once | INTRAVENOUS | Status: AC
  Administered 2019-10-24: 150 mg via INTRAVENOUS
  Filled 2019-10-24: qty 150

## 2019-10-24 MED ORDER — SODIUM CHLORIDE 0.9% FLUSH
10.0000 mL | INTRAVENOUS | Status: DC | PRN
  Administered 2019-10-24: 10 mL
  Filled 2019-10-24: qty 10

## 2019-10-24 MED ORDER — SODIUM CHLORIDE 0.9 % IV SOLN
140.0000 mg/m2 | Freq: Once | INTRAVENOUS | Status: AC
  Administered 2019-10-24: 324 mg via INTRAVENOUS
  Filled 2019-10-24: qty 54

## 2019-10-24 MED ORDER — HEPARIN SOD (PORK) LOCK FLUSH 100 UNIT/ML IV SOLN
500.0000 [IU] | Freq: Once | INTRAVENOUS | Status: AC | PRN
  Administered 2019-10-24: 500 [IU]
  Filled 2019-10-24: qty 5

## 2019-10-24 MED ORDER — INFLUENZA VAC SPLIT QUAD 0.5 ML IM SUSY
0.5000 mL | PREFILLED_SYRINGE | Freq: Once | INTRAMUSCULAR | Status: AC
  Administered 2019-10-24: 0.5 mL via INTRAMUSCULAR

## 2019-10-24 MED ORDER — PALONOSETRON HCL INJECTION 0.25 MG/5ML
0.2500 mg | Freq: Once | INTRAVENOUS | Status: AC
  Administered 2019-10-24: 0.25 mg via INTRAVENOUS

## 2019-10-24 MED ORDER — SODIUM CHLORIDE 0.9 % IV SOLN
750.0000 mg | Freq: Once | INTRAVENOUS | Status: AC
  Administered 2019-10-24: 750 mg via INTRAVENOUS
  Filled 2019-10-24: qty 75

## 2019-10-24 MED ORDER — DIPHENHYDRAMINE HCL 50 MG/ML IJ SOLN
25.0000 mg | Freq: Once | INTRAMUSCULAR | Status: AC
  Administered 2019-10-24: 25 mg via INTRAVENOUS

## 2019-10-24 MED ORDER — SODIUM CHLORIDE 0.9 % IV SOLN
Freq: Once | INTRAVENOUS | Status: AC
  Filled 2019-10-24: qty 250

## 2019-10-24 MED ORDER — FAMOTIDINE IN NACL 20-0.9 MG/50ML-% IV SOLN
20.0000 mg | Freq: Once | INTRAVENOUS | Status: AC
  Administered 2019-10-24: 20 mg via INTRAVENOUS

## 2019-10-24 MED ORDER — INFLUENZA VAC SPLIT QUAD 0.5 ML IM SUSY
PREFILLED_SYRINGE | INTRAMUSCULAR | Status: AC
  Filled 2019-10-24: qty 0.5

## 2019-10-24 MED ORDER — DIPHENHYDRAMINE HCL 50 MG/ML IJ SOLN
INTRAMUSCULAR | Status: AC
  Filled 2019-10-24: qty 1

## 2019-10-24 MED ORDER — PALONOSETRON HCL INJECTION 0.25 MG/5ML
INTRAVENOUS | Status: AC
  Filled 2019-10-24: qty 5

## 2019-10-24 MED ORDER — FAMOTIDINE IN NACL 20-0.9 MG/50ML-% IV SOLN
INTRAVENOUS | Status: AC
  Filled 2019-10-24: qty 50

## 2019-10-24 MED ORDER — SODIUM CHLORIDE 0.9 % IV SOLN
10.0000 mg | Freq: Once | INTRAVENOUS | Status: AC
  Administered 2019-10-24: 10 mg via INTRAVENOUS
  Filled 2019-10-24: qty 10

## 2019-10-24 MED ORDER — SODIUM CHLORIDE 0.9% FLUSH
10.0000 mL | Freq: Once | INTRAVENOUS | Status: AC
  Administered 2019-10-24: 10 mL
  Filled 2019-10-24: qty 10

## 2019-10-24 NOTE — Patient Instructions (Signed)

## 2019-10-24 NOTE — Assessment & Plan Note (Addendum)
She tolerated cycle 2 of treatment well without major side effects except for fatigue, headaches and slight peripheral neuropathy We will proceed with treatment as scheduled

## 2019-10-24 NOTE — Patient Instructions (Signed)
[facility] Discharge Instructions for Patients Receiving Chemotherapy  Today you received the following chemotherapy agents: paclitaxel/carboplatin.  To help prevent nausea and vomiting after your treatment, we encourage you to take your nausea medication as directed.  If you develop nausea and vomiting that is not controlled by your nausea medication, call the clinic.   BELOW ARE SYMPTOMS THAT SHOULD BE REPORTED IMMEDIATELY:  *FEVER GREATER THAN 100.5 F  *CHILLS WITH OR WITHOUT FEVER  NAUSEA AND VOMITING THAT IS NOT CONTROLLED WITH YOUR NAUSEA MEDICATION  *UNUSUAL SHORTNESS OF BREATH  *UNUSUAL BRUISING OR BLEEDING  TENDERNESS IN MOUTH AND THROAT WITH OR WITHOUT PRESENCE OF ULCERS  *URINARY PROBLEMS  *BOWEL PROBLEMS  UNUSUAL RASH Items with * indicate a potential emergency and should be followed up as soon as possible.  Feel free to call the clinic should you have any questions or concerns. The clinic phone number is [phone].  Please show the CHEMO ALERT CARD at check-in to the Emergency Department and triage nurse.   

## 2019-10-24 NOTE — Progress Notes (Signed)
[facility] OFF[facility] PROGRESS NOTE  Patient Care Team: Hali Marry, MD as PCP - General (Family Medicine) Druscilla Brownie, MD (Dermatology) Kristeen Miss, MD (Neurosurgery) Guss Bunde, MD (Obstetrics and Gynecology) Awanda Mink Craige Cotta, RN as Oncology Nurse Navigator (Oncology)  ASSESSMENT & PLAN:  Endometrial cancer [facility]) She tolerated cycle 2 of treatment well without major side effects except for fatigue, headaches and slight peripheral neuropathy We will proceed with treatment as scheduled  Peripheral neuropathy due to chemotherapy [facility]) She will try to use ice during treatment to reduce risk of progression of neuropathy  Mild headache Could be exacerbated by recent steroids and lack of caffeinated beverages She has omitted morning treatment dexamethasone If her headache is still bad with this cycle, we will reduce IV premed dexamethasone   No orders of the defined types were placed in this encounter.   All questions were answered. The patient knows to call the clinic with any problems, questions or concerns. The total time spent in the appointment was 20 minutes encounter with patients including review of chart and various tests results, discussions about plan of care and coordination of care plan   [name], MD 10/24/2019 8:53 AM  INTERVAL HISTORY: Please see below for problem oriented charting. She returns with her wife for treatment today With her last cycle of treatment, she has some mild fatigue, headache as well as slight peripheral neuropathy She did not use ice treatment during chemotherapy infusion Denies nausea or constipation No further skin rash on her scalp  SUMMARY OF ONCOLOGIC HISTORY: Oncology History Overview Note  MSI Stable   Endometrial cancer ([place])  07/28/2019 Pathology Results   Endometrium, biopsy - ENDOMETRIOID CARCINOMA - SEE COMMENT Microscopic Comment Based on the biopsy, the carcinoma appears FIGO grade 1.    08/01/2019 Imaging   CT abdomen and pelvis No evidence of abdominal or pelvic metastatic disease.   Small uterine fibroid.   Colonic diverticulosis, without radiographic evidence of diverticulitis.   Aortic Atherosclerosis (ICD10-I70.0).   08/14/2019 Surgery   Surgeon: Donaciano Eva  Operation: Robotic-assisted laparoscopic total hysterectomy with bilateral salpingoophorectomy, SLN biopsy     Operative Findings:  : 8cm uterus which appeared grossly normal, normal appearing tubes and ovaries, no suspicious lymph nodes. Brisk oozing from skin incisions.    09/03/2019 Cancer Staging   Staging form: Corpus Uteri - Carcinoma and Carcinosarcoma, AJCC 8th Edition - Pathologic stage from 09/03/2019: FIGO Stage IIIA (pT3a, pN0, cM0) - Signed by [name], MD on 09/03/2019   09/04/2019 Pathology Results   FINAL MICROSCOPIC DIAGNOSIS:   A. LYMPH NODE, SENTINEL RIGHT OBTURATOR PROXIMAL, BIOPSY:  -  No carcinoma identified in one lymph node (0/1)  -  See comment   B. LYMPH NODE, SENTINEL RIGHT OBTURATOR DISTAL, BIOPSY:  -  No carcinoma identified in one lymph node (0/1)  -  See comment   C. LYMPH NODE, SENTINEL LEFT EXTERNAL ILIAC, BIOPSY:  -  No carcinoma identified in one lymph node (0/1)  -  See comment   D. UTERUS AND BILATERAL ADNEXA, HYSTERECTOMY WITH SALPINGO-OOPHORECTOMY:   Uterus:  -  Endometrioid carcinoma, FIGO grade 1  -  Leiomyoma (1.8 cm; largest)  -  See oncology table and comment below   Cervix:  -  Cervix with squamous metaplasia  -  No carcinoma identified   Bilateral Ovaries:  -  Endometrioid carcinoma involving right ovary  -  No carcinoma involving left ovary  -  Endosalpingiosis   Bilateral Fallopian  tubes:  -  No carcinoma identified   ONCOLOGY TABLE:   UTERUS, CARCINOMA OR CARCINOSARCOMA   Procedure: Total hysterectomy and bilateral salpingo-oophorectomy  Histologic type: Endometrioid carcinoma, NOS  Histologic Grade: FIGO grade 1  Myometrial  invasion:       Depth of invasion: 3 mm       Myometrial thickness: 20 mm  Uterine Serosa Involvement: Not identified  Cervical stromal involvement: Not identified  Extent of involvement of other organs: Right ovary  Lymphovascular invasion: Not identified  Regional Lymph Nodes:       Examined:      3 Sentinel                               0 non-sentinel                               3 total        Lymph nodes with metastasis: 0        Isolated tumor cells (<0.2 mm): 0        Micrometastasis:  (>0.2 mm and < 2.0 mm): 0        Macrometastasis: (>2.0 mm): 0        Extracapsular extension: N/A  Representative Tumor Block: D10  MMR / MSI testing: Pending  Pathologic Stage Classification (pTNM, AJCC 8th edition):  pT3a, pN0  Comments: Pancytokeratin performed on the lymph nodes is negative. The right ovary has a focus of carcinoma.     09/11/2019 Procedure   Placement of single lumen port a cath via right internal jugular vein. The catheter tip lies at the cavo-atrial junction. A power injectable port a cath was placed and is ready for immediate use.     09/12/2019 -  Chemotherapy   The patient had carboplatin and taxol for chemotherapy treatment.       REVIEW OF SYSTEMS:   Constitutional: Denies fevers, chills or abnormal weight loss Eyes: Denies blurriness of vision Ears, nose, mouth, throat, and face: Denies mucositis or sore throat Respiratory: Denies cough, dyspnea or wheezes Cardiovascular: Denies palpitation, chest discomfort or lower extremity swelling Gastrointestinal:  Denies nausea, heartburn or change in bowel habits Skin: Denies abnormal skin rashes Lymphatics: Denies new lymphadenopathy or easy bruising Behavioral/Psych: Mood is stable, no new changes  All other systems were reviewed with the patient and are negative.  I have reviewed the past medical history, past surgical history, social history and family history with the patient and they are unchanged from  previous note.  ALLERGIES:  has No Known Allergies.  MEDICATIONS:  Current Outpatient Medications  Medication Sig Dispense Refill  . cholecalciferol (VITAMIN D) 1000 UNITS tablet Take 1,000 Units by mouth daily.      Marland Kitchen dexamethasone (DECADRON) 4 MG tablet Take 2 tabs at the night before and 2 tab the morning of chemotherapy, every 3 weeks, by mouth x 6 cycles 36 tablet 6  . DULoxetine (CYMBALTA) 60 MG capsule Take 1 capsule (60 mg total) by mouth daily. 90 capsule 1  . fluticasone (FLONASE) 50 MCG/ACT nasal spray Place 2 sprays into both nostrils daily as needed for allergies or rhinitis.    Marland Kitchen gabapentin (NEURONTIN) 300 MG capsule Take 2 capsules (600 mg total) by mouth daily. 180 capsule 2  . lidocaine-prilocaine (EMLA) cream Apply to affected area once 30 g 3  . Multiple Vitamins-Minerals (PRESERVISION AREDS) CAPS  Take 1 capsule by mouth 1 day or 1 dose.    . ondansetron (ZOFRAN) 8 MG tablet Take 1 tablet (8 mg total) by mouth every 8 (eight) hours as needed. Start on the third day after chemotherapy. 30 tablet 1  . polyethylene glycol (MIRALAX / GLYCOLAX) 17 g packet Take 17 g by mouth daily as needed.    . prochlorperazine (COMPAZINE) 10 MG tablet Take 1 tablet (10 mg total) by mouth every 6 (six) hours as needed (Nausea or vomiting). 30 tablet 1  . rOPINIRole (REQUIP) 0.5 MG tablet TAKE 1 TABLET BY MOUTH AT  BEDTIME 90 tablet 3  . vitamin C (ASCORBIC ACID) 500 MG tablet Take 500 mg by mouth daily.       No current facility-administered medications for this visit.   Facility-Administered Medications Ordered in Other Visits  Medication Dose Route Frequency Provider Last Rate Last Admin  . CARBOplatin (PARAPLATIN) 750 mg in sodium chloride 0.9 % 250 mL chemo infusion  750 mg Intravenous Once [name], Kodah Maret, MD      . dexamethasone (DECADRON) 10 mg in sodium chloride 0.9 % 50 mL IVPB  10 mg Intravenous Once [name], Horst Ostermiller, MD      . famotidine (PEPCID) IVPB 20 mg premix  20 mg Intravenous Once  [name], Eleanor Gatliff, MD      . fosaprepitant (EMEND) 150 mg in sodium chloride 0.9 % 145 mL IVPB  150 mg Intravenous Once [name], Evee Liska, MD      . heparin lock flush 100 unit/mL  500 Units Intracatheter Once PRN [name], Ismar Yabut, MD      . influenza vac split quadrivalent PF (FLUARIX) injection 0.5 mL  0.5 mL Intramuscular Once [name], Kristeen Lantz, MD      . PACLitaxel (TAXOL) 324 mg in sodium chloride 0.9 % 500 mL chemo infusion (> 34m/m2)  140 mg/m2 (Treatment Plan Recorded) Intravenous Once Hilliard Borges, MD      . sodium chloride flush (NS) 0.9 % injection 10 mL  10 mL Intracatheter PRN GAlvy Bimler Hollin Crewe, MD        PHYSICAL EXAMINATION: ECOG PERFORMANCE STATUS: 1 - Symptomatic but completely ambulatory  Vitals:   10/24/19 0825  BP: 136/68  Pulse: 99  Resp: 18  Temp: 97.7 F (36.5 C)  SpO2: 98%   Filed Weights   10/24/19 0825  Weight: 254 lb 9.6 oz (115.5 kg)    GENERAL:alert, no distress and comfortable SKIN: skin color, texture, turgor are normal, no rashes or significant lesions EYES: normal, Conjunctiva are pink and non-injected, sclera clear OROPHARYNX:no exudate, no erythema and lips, buccal mucosa, and tongue normal  NECK: supple, thyroid normal size, non-tender, without nodularity LYMPH:  no palpable lymphadenopathy in the cervical, axillary or inguinal LUNGS: clear to auscultation and percussion with normal breathing effort HEART: regular rate & rhythm and no murmurs and no lower extremity edema ABDOMEN:abdomen soft, non-tender and normal bowel sounds Musculoskeletal:no cyanosis of digits and no clubbing  NEURO: alert & oriented x 3 with fluent speech, no focal motor/sensory deficits  LABORATORY DATA:  I have reviewed the data as listed    Component Value Date/Time   NA 138 10/24/2019 0737   NA 141 01/20/2014 1604   K 4.3 10/24/2019 0737   K 4.2 01/20/2014 1604   CL 103 10/24/2019 0737   CO2 27 10/24/2019 0737   CO2 28 01/20/2014 1604   GLUCOSE 127 (H) 10/24/2019 0737   GLUCOSE 99  01/20/2014 1604   BUN 12 10/24/2019 0737   BUN 11  07/31/2019 0000   BUN 14.6 01/20/2014 1604   CREATININE 0.76 10/24/2019 0737   CREATININE 0.78 03/24/2019 1041   CREATININE 0.8 01/20/2014 1604   CALCIUM 9.9 10/24/2019 0737   CALCIUM 9.8 01/20/2014 1604   PROT 7.7 10/24/2019 0737   PROT 7.3 01/20/2014 1604   ALBUMIN 3.7 10/24/2019 0737   ALBUMIN 4.0 01/20/2014 1604   AST 25 10/24/2019 0737   AST 22 01/20/2014 1604   ALT 34 10/24/2019 0737   ALT 20 01/20/2014 1604   ALKPHOS 161 (H) 10/24/2019 0737   ALKPHOS 134 01/20/2014 1604   BILITOT 0.3 10/24/2019 0737   BILITOT <0.20 01/20/2014 1604   GFRNONAA >60 10/24/2019 0737   GFRNONAA 81 03/24/2019 1041   GFRAA >60 10/24/2019 0737   GFRAA 94 03/24/2019 1041    No results found for: SPEP, UPEP  Lab Results  Component Value Date   WBC 8.8 10/24/2019   NEUTROABS 7.5 10/24/2019   HGB 12.4 10/24/2019   HCT 36.6 10/24/2019   MCV 95.3 10/24/2019   PLT 326 10/24/2019      Chemistry      Component Value Date/Time   NA 138 10/24/2019 0737   NA 141 01/20/2014 1604   K 4.3 10/24/2019 0737   K 4.2 01/20/2014 1604   CL 103 10/24/2019 0737   CO2 27 10/24/2019 0737   CO2 28 01/20/2014 1604   BUN 12 10/24/2019 0737   BUN 11 07/31/2019 0000   BUN 14.6 01/20/2014 1604   CREATININE 0.76 10/24/2019 0737   CREATININE 0.78 03/24/2019 1041   CREATININE 0.8 01/20/2014 1604      Component Value Date/Time   CALCIUM 9.9 10/24/2019 0737   CALCIUM 9.8 01/20/2014 1604   ALKPHOS 161 (H) 10/24/2019 0737   ALKPHOS 134 01/20/2014 1604   AST 25 10/24/2019 0737   AST 22 01/20/2014 1604   ALT 34 10/24/2019 0737   ALT 20 01/20/2014 1604   BILITOT 0.3 10/24/2019 0737   BILITOT <0.20 01/20/2014 1604

## 2019-10-24 NOTE — Assessment & Plan Note (Signed)
She will try to use ice during treatment to reduce risk of progression of neuropathy

## 2019-10-24 NOTE — Assessment & Plan Note (Signed)
Could be exacerbated by recent steroids and lack of caffeinated beverages She has omitted morning treatment dexamethasone If her headache is still bad with this cycle, we will reduce IV premed dexamethasone

## 2019-11-03 ENCOUNTER — Telehealth: Payer: Self-pay

## 2019-11-03 NOTE — Telephone Encounter (Signed)
Called to follow up regarding call for complaint of thrush over the weekend. On call MD sent in Magic mouth wash and she started taking it on Saturday.  Called and spoke with wife. She said Patricia Waters is feeling better and her mouth is feeling better. She is eating/drinking with no problems. Instructed to call the office back if needed. She verbalized understanding.

## 2019-11-05 ENCOUNTER — Other Ambulatory Visit: Payer: Self-pay | Admitting: Gynecologic Oncology

## 2019-11-05 DIAGNOSIS — C541 Malignant neoplasm of endometrium: Secondary | ICD-10-CM

## 2019-11-06 ENCOUNTER — Telehealth: Payer: Self-pay | Admitting: Hematology and Oncology

## 2019-11-06 NOTE — Telephone Encounter (Signed)
Release: [id number] Faxed medical records to [facility] @ fax #

## 2019-11-14 ENCOUNTER — Inpatient Hospital Stay: Payer: 59

## 2019-11-14 ENCOUNTER — Inpatient Hospital Stay: Payer: 59 | Attending: Gynecologic Oncology | Admitting: Hematology and Oncology

## 2019-11-14 ENCOUNTER — Encounter: Payer: Self-pay | Admitting: Hematology and Oncology

## 2019-11-14 ENCOUNTER — Other Ambulatory Visit: Payer: Self-pay

## 2019-11-14 DIAGNOSIS — Z6841 Body Mass Index (BMI) 40.0 and over, adult: Secondary | ICD-10-CM

## 2019-11-14 DIAGNOSIS — Z5111 Encounter for antineoplastic chemotherapy: Secondary | ICD-10-CM | POA: Diagnosis not present

## 2019-11-14 DIAGNOSIS — C541 Malignant neoplasm of endometrium: Secondary | ICD-10-CM

## 2019-11-14 DIAGNOSIS — T451X5A Adverse effect of antineoplastic and immunosuppressive drugs, initial encounter: Secondary | ICD-10-CM | POA: Diagnosis not present

## 2019-11-14 DIAGNOSIS — G620 Drug-induced polyneuropathy: Secondary | ICD-10-CM | POA: Diagnosis not present

## 2019-11-14 LAB — CBC WITH DIFFERENTIAL (CANCER CENTER ONLY)
Abs Immature Granulocytes: 0.1 10*3/uL — ABNORMAL HIGH (ref 0.00–0.07)
Basophils Absolute: 0 10*3/uL (ref 0.0–0.1)
Basophils Relative: 0 %
Eosinophils Absolute: 0 10*3/uL (ref 0.0–0.5)
Eosinophils Relative: 0 %
HCT: 33.4 % — ABNORMAL LOW (ref 36.0–46.0)
Hemoglobin: 11.4 g/dL — ABNORMAL LOW (ref 12.0–15.0)
Immature Granulocytes: 1 %
Lymphocytes Relative: 11 %
Lymphs Abs: 1.1 10*3/uL (ref 0.7–4.0)
MCH: 33 pg (ref 26.0–34.0)
MCHC: 34.1 g/dL (ref 30.0–36.0)
MCV: 96.8 fL (ref 80.0–100.0)
Monocytes Absolute: 0.5 10*3/uL (ref 0.1–1.0)
Monocytes Relative: 5 %
Neutro Abs: 8.3 10*3/uL — ABNORMAL HIGH (ref 1.7–7.7)
Neutrophils Relative %: 83 %
Platelet Count: 157 10*3/uL (ref 150–400)
RBC: 3.45 MIL/uL — ABNORMAL LOW (ref 3.87–5.11)
RDW: 15.6 % — ABNORMAL HIGH (ref 11.5–15.5)
WBC Count: 10.1 10*3/uL (ref 4.0–10.5)
nRBC: 0 % (ref 0.0–0.2)

## 2019-11-14 LAB — CMP (CANCER CENTER ONLY)
ALT: 29 U/L (ref 0–44)
AST: 27 U/L (ref 15–41)
Albumin: 3.6 g/dL (ref 3.5–5.0)
Alkaline Phosphatase: 143 U/L — ABNORMAL HIGH (ref 38–126)
Anion gap: 9 (ref 5–15)
BUN: 11 mg/dL (ref 8–23)
CO2: 25 mmol/L (ref 22–32)
Calcium: 10.2 mg/dL (ref 8.9–10.3)
Chloride: 103 mmol/L (ref 98–111)
Creatinine: 0.78 mg/dL (ref 0.44–1.00)
GFR, Estimated: >60 mL/min (ref >60)
Glucose, Bld: 180 mg/dL — ABNORMAL HIGH (ref 70–99)
Potassium: 4 mmol/L (ref 3.5–5.1)
Sodium: 137 mmol/L (ref 135–145)
Total Bilirubin: 0.3 mg/dL (ref 0.3–1.2)
Total Protein: 7.4 g/dL (ref 6.5–8.1)

## 2019-11-14 MED ORDER — FAMOTIDINE IN NACL 20-0.9 MG/50ML-% IV SOLN
INTRAVENOUS | Status: AC
  Filled 2019-11-14: qty 50

## 2019-11-14 MED ORDER — HEPARIN SOD (PORK) LOCK FLUSH 100 UNIT/ML IV SOLN
500.0000 [IU] | Freq: Once | INTRAVENOUS | Status: AC | PRN
  Administered 2019-11-14: 500 [IU]
  Filled 2019-11-14: qty 5

## 2019-11-14 MED ORDER — PALONOSETRON HCL INJECTION 0.25 MG/5ML
0.2500 mg | Freq: Once | INTRAVENOUS | Status: AC
  Administered 2019-11-14: 0.25 mg via INTRAVENOUS

## 2019-11-14 MED ORDER — SODIUM CHLORIDE 0.9 % IV SOLN
10.0000 mg | Freq: Once | INTRAVENOUS | Status: AC
  Administered 2019-11-14: 10 mg via INTRAVENOUS
  Filled 2019-11-14: qty 10

## 2019-11-14 MED ORDER — DIPHENHYDRAMINE HCL 50 MG/ML IJ SOLN
INTRAMUSCULAR | Status: AC
  Filled 2019-11-14: qty 1

## 2019-11-14 MED ORDER — DIPHENHYDRAMINE HCL 50 MG/ML IJ SOLN
25.0000 mg | Freq: Once | INTRAMUSCULAR | Status: AC
  Administered 2019-11-14: 25 mg via INTRAVENOUS

## 2019-11-14 MED ORDER — SODIUM CHLORIDE 0.9 % IV SOLN
140.0000 mg/m2 | Freq: Once | INTRAVENOUS | Status: AC
  Administered 2019-11-14: 324 mg via INTRAVENOUS
  Filled 2019-11-14: qty 54

## 2019-11-14 MED ORDER — SODIUM CHLORIDE 0.9% FLUSH
10.0000 mL | Freq: Once | INTRAVENOUS | Status: AC
  Administered 2019-11-14: 10 mL
  Filled 2019-11-14: qty 10

## 2019-11-14 MED ORDER — SODIUM CHLORIDE 0.9 % IV SOLN
Freq: Once | INTRAVENOUS | Status: AC
  Filled 2019-11-14: qty 250

## 2019-11-14 MED ORDER — SODIUM CHLORIDE 0.9% FLUSH
10.0000 mL | INTRAVENOUS | Status: DC | PRN
  Administered 2019-11-14: 10 mL
  Filled 2019-11-14: qty 10

## 2019-11-14 MED ORDER — SODIUM CHLORIDE 0.9 % IV SOLN
750.0000 mg | Freq: Once | INTRAVENOUS | Status: AC
  Administered 2019-11-14: 750 mg via INTRAVENOUS
  Filled 2019-11-14: qty 75

## 2019-11-14 MED ORDER — FAMOTIDINE IN NACL 20-0.9 MG/50ML-% IV SOLN
20.0000 mg | Freq: Once | INTRAVENOUS | Status: AC
  Administered 2019-11-14: 20 mg via INTRAVENOUS

## 2019-11-14 MED ORDER — SODIUM CHLORIDE 0.9 % IV SOLN
150.0000 mg | Freq: Once | INTRAVENOUS | Status: AC
  Administered 2019-11-14: 150 mg via INTRAVENOUS
  Filled 2019-11-14: qty 150

## 2019-11-14 MED ORDER — PALONOSETRON HCL INJECTION 0.25 MG/5ML
INTRAVENOUS | Status: AC
  Filled 2019-11-14: qty 5

## 2019-11-14 NOTE — Patient Instructions (Signed)

## 2019-11-14 NOTE — Assessment & Plan Note (Signed)
She will try to use ice during treatment to reduce risk of progression of neuropathy

## 2019-11-14 NOTE — Assessment & Plan Note (Signed)
She tolerated last cycle of treatment well without major side effects except for mild fatigue, oral thrush and joint pain We will proceed with treatment as scheduled

## 2019-11-14 NOTE — Progress Notes (Signed)
[place] OFFICE PROGRESS NOTE  Patient Care Team: Hali Marry, MD as PCP - General (Family Medicine) Druscilla Brownie, MD (Dermatology) Kristeen Miss, MD (Neurosurgery) Guss Bunde, MD (Obstetrics and Gynecology) Awanda Mink Craige Cotta, RN as Oncology Nurse Navigator (Oncology)  ASSESSMENT & PLAN:  Endometrial cancer [facility]) She tolerated last cycle of treatment well without major side effects except for mild fatigue, oral thrush and joint pain We will proceed with treatment as scheduled  BMI 40.0-44.9, adult [facility]) We have extensive discussions about weight loss strategies Hopefully, by omitting oral premed the morning of chemotherapy, that would cause less weight gain  Peripheral neuropathy due to chemotherapy [facility]) She will try to use ice during treatment to reduce risk of progression of neuropathy   No orders of the defined types were placed in this encounter.   All questions were answered. The patient knows to call the clinic with any problems, questions or concerns. The total time spent in the appointment was 20 minutes encounter with patients including review of chart and various tests results, discussions about plan of care and coordination of care plan   [name], MD 11/14/2019 9:16 AM  INTERVAL HISTORY: Please see below for problem oriented charting. She returns with her wife for further follow-up She tolerated last cycle of treatment well except for joint pain, thrush and fatigue She is quite sedentary after each cycle of treatment but would like to start walking again with the cooler weather The ice treatment during chemotherapy helped reduce risk of progression of peripheral neuropathy She has gained some weight and felt swollen in her arms and legs  SUMMARY OF ONCOLOGIC HISTORY: Oncology History Overview Note  MSI Stable   Endometrial cancer ([place])  07/28/2019 Pathology Results   Endometrium, biopsy - ENDOMETRIOID CARCINOMA - SEE  COMMENT Microscopic Comment Based on the biopsy, the carcinoma appears FIGO grade 1.   08/01/2019 Imaging   CT abdomen and pelvis No evidence of abdominal or pelvic metastatic disease.   Small uterine fibroid.   Colonic diverticulosis, without radiographic evidence of diverticulitis.   Aortic Atherosclerosis (ICD10-I70.0).   08/14/2019 Surgery   Surgeon: Donaciano Eva  Operation: Robotic-assisted laparoscopic total hysterectomy with bilateral salpingoophorectomy, SLN biopsy     Operative Findings:  : 8cm uterus which appeared grossly normal, normal appearing tubes and ovaries, no suspicious lymph nodes. Brisk oozing from skin incisions.    09/03/2019 Cancer Staging   Staging form: Corpus Uteri - Carcinoma and Carcinosarcoma, AJCC 8th Edition - Pathologic stage from 09/03/2019: FIGO Stage IIIA (pT3a, pN0, cM0) - Signed by [name], MD on 09/03/2019   09/04/2019 Pathology Results   FINAL MICROSCOPIC DIAGNOSIS:   A. LYMPH NODE, SENTINEL RIGHT OBTURATOR PROXIMAL, BIOPSY:  -  No carcinoma identified in one lymph node (0/1)  -  See comment   B. LYMPH NODE, SENTINEL RIGHT OBTURATOR DISTAL, BIOPSY:  -  No carcinoma identified in one lymph node (0/1)  -  See comment   C. LYMPH NODE, SENTINEL LEFT EXTERNAL ILIAC, BIOPSY:  -  No carcinoma identified in one lymph node (0/1)  -  See comment   D. UTERUS AND BILATERAL ADNEXA, HYSTERECTOMY WITH SALPINGO-OOPHORECTOMY:   Uterus:  -  Endometrioid carcinoma, FIGO grade 1  -  Leiomyoma (1.8 cm; largest)  -  See oncology table and comment below   Cervix:  -  Cervix with squamous metaplasia  -  No carcinoma identified   Bilateral Ovaries:  -  Endometrioid carcinoma involving right ovary  -  No carcinoma involving left ovary  -  Endosalpingiosis   Bilateral Fallopian tubes:  -  No carcinoma identified   ONCOLOGY TABLE:   UTERUS, CARCINOMA OR CARCINOSARCOMA   Procedure: Total hysterectomy and bilateral salpingo-oophorectomy   Histologic type: Endometrioid carcinoma, NOS  Histologic Grade: FIGO grade 1  Myometrial invasion:       Depth of invasion: 3 mm       Myometrial thickness: 20 mm  Uterine Serosa Involvement: Not identified  Cervical stromal involvement: Not identified  Extent of involvement of other organs: Right ovary  Lymphovascular invasion: Not identified  Regional Lymph Nodes:       Examined:      3 Sentinel                               0 non-sentinel                               3 total        Lymph nodes with metastasis: 0        Isolated tumor cells (<0.2 mm): 0        Micrometastasis:  (>0.2 mm and < 2.0 mm): 0        Macrometastasis: (>2.0 mm): 0        Extracapsular extension: N/A  Representative Tumor Block: D10  MMR / MSI testing: Pending  Pathologic Stage Classification (pTNM, AJCC 8th edition):  pT3a, pN0  Comments: Pancytokeratin performed on the lymph nodes is negative. The right ovary has a focus of carcinoma.     09/11/2019 Procedure   Placement of single lumen port a cath via right internal jugular vein. The catheter tip lies at the cavo-atrial junction. A power injectable port a cath was placed and is ready for immediate use.     09/12/2019 -  Chemotherapy   The patient had carboplatin and taxol for chemotherapy treatment.       REVIEW OF SYSTEMS:   Constitutional: Denies fevers, chills or abnormal weight loss Eyes: Denies blurriness of vision Ears, nose, mouth, throat, and face: Denies mucositis or sore throat Respiratory: Denies cough, dyspnea or wheezes Cardiovascular: Denies palpitation, chest discomfort  Gastrointestinal:  Denies nausea, heartburn or change in bowel habits Skin: Denies abnormal skin rashes Lymphatics: Denies new lymphadenopathy or easy bruising Behavioral/Psych: Mood is stable, no new changes  All other systems were reviewed with the patient and are negative.  I have reviewed the past medical history, past surgical history, social history and  family history with the patient and they are unchanged from previous note.  ALLERGIES:  has No Known Allergies.  MEDICATIONS:  Current Outpatient Medications  Medication Sig Dispense Refill  . cholecalciferol (VITAMIN D) 1000 UNITS tablet Take 1,000 Units by mouth daily.      Marland Kitchen dexamethasone (DECADRON) 4 MG tablet Take 2 tabs at the night before and 2 tab the morning of chemotherapy, every 3 weeks, by mouth x 6 cycles 36 tablet 6  . DULoxetine (CYMBALTA) 60 MG capsule Take 1 capsule (60 mg total) by mouth daily. 90 capsule 1  . fluticasone (FLONASE) 50 MCG/ACT nasal spray Place 2 sprays into both nostrils daily as needed for allergies or rhinitis.    Marland Kitchen gabapentin (NEURONTIN) 300 MG capsule Take 2 capsules (600 mg total) by mouth daily. 180 capsule 2  . lidocaine-prilocaine (EMLA) cream Apply to affected area once  30 g 3  . Multiple Vitamins-Minerals (PRESERVISION AREDS) CAPS Take 1 capsule by mouth 1 day or 1 dose.    . ondansetron (ZOFRAN) 8 MG tablet Take 1 tablet (8 mg total) by mouth every 8 (eight) hours as needed. Start on the third day after chemotherapy. 30 tablet 1  . polyethylene glycol (MIRALAX / GLYCOLAX) 17 g packet Take 17 g by mouth daily as needed.    . prochlorperazine (COMPAZINE) 10 MG tablet Take 1 tablet (10 mg total) by mouth every 6 (six) hours as needed (Nausea or vomiting). 30 tablet 1  . rOPINIRole (REQUIP) 0.5 MG tablet TAKE 1 TABLET BY MOUTH AT  BEDTIME 90 tablet 3  . vitamin C (ASCORBIC ACID) 500 MG tablet Take 500 mg by mouth daily.       No current facility-administered medications for this visit.   Facility-Administered Medications Ordered in Other Visits  Medication Dose Route Frequency Provider Last Rate Last Admin  . 0.9 %  sodium chloride infusion   Intravenous Once Abenezer Odonell, MD      . CARBOplatin (PARAPLATIN) 750 mg in sodium chloride 0.9 % 250 mL chemo infusion  750 mg Intravenous Once [name], Perkins Molina, MD      . dexamethasone (DECADRON) 10 mg in sodium  chloride 0.9 % 50 mL IVPB  10 mg Intravenous Once [name], Holbert Caples, MD      . diphenhydrAMINE (BENADRYL) injection 25 mg  25 mg Intravenous Once [name], Albina Gosney, MD      . famotidine (PEPCID) IVPB 20 mg premix  20 mg Intravenous Once [name], Beauden Tremont, MD      . fosaprepitant (EMEND) 150 mg in sodium chloride 0.9 % 145 mL IVPB  150 mg Intravenous Once [name], Shaquasha Gerstel, MD      . heparin lock flush 100 unit/mL  500 Units Intracatheter Once PRN [name], Zari Cly, MD      . PACLitaxel (TAXOL) 324 mg in sodium chloride 0.9 % 500 mL chemo infusion (> 11m/m2)  140 mg/m2 (Treatment Plan Recorded) Intravenous Once GAlvy Bimler Jonmarc Bodkin, MD      . palonosetron (ALOXI) injection 0.25 mg  0.25 mg Intravenous Once Shavelle Runkel, MD      . sodium chloride flush (NS) 0.9 % injection 10 mL  10 mL Intracatheter PRN GAlvy Bimler Antonino Nienhuis, MD        PHYSICAL EXAMINATION: ECOG PERFORMANCE STATUS: 1 - Symptomatic but completely ambulatory  Vitals:   11/14/19 0835  BP: (!) 144/76  Pulse: 89  Resp: 18  Temp: 97.8 F (36.6 C)  SpO2: 99%   Filed Weights   11/14/19 0835  Weight: 261 lb 3.2 oz (118.5 kg)    GENERAL:alert, no distress and comfortable SKIN: skin color, texture, turgor are normal, no rashes or significant lesions EYES: normal, Conjunctiva are pink and non-injected, sclera clear OROPHARYNX:no exudate, no erythema and lips, buccal mucosa, and tongue normal  NECK: supple, thyroid normal size, non-tender, without nodularity LYMPH:  no palpable lymphadenopathy in the cervical, axillary or inguinal LUNGS: clear to auscultation and percussion with normal breathing effort HEART: regular rate & rhythm and no murmurs with mild bilateral lower extremity edema ABDOMEN:abdomen soft, non-tender and normal bowel sounds Musculoskeletal:no cyanosis of digits and no clubbing  NEURO: alert & oriented x 3 with fluent speech, no focal motor/sensory deficits  LABORATORY DATA:  I have reviewed the data as listed    Component Value Date/Time   NA 137  11/14/2019 0815   NA 141 01/20/2014 1604   K 4.0 11/14/2019 0815  K 4.2 01/20/2014 1604   CL 103 11/14/2019 0815   CO2 25 11/14/2019 0815   CO2 28 01/20/2014 1604   GLUCOSE 180 (H) 11/14/2019 0815   GLUCOSE 99 01/20/2014 1604   BUN 11 11/14/2019 0815   BUN 11 07/31/2019 0000   BUN 14.6 01/20/2014 1604   CREATININE 0.78 11/14/2019 0815   CREATININE 0.78 03/24/2019 1041   CREATININE 0.8 01/20/2014 1604   CALCIUM 10.2 11/14/2019 0815   CALCIUM 9.8 01/20/2014 1604   PROT 7.4 11/14/2019 0815   PROT 7.3 01/20/2014 1604   ALBUMIN 3.6 11/14/2019 0815   ALBUMIN 4.0 01/20/2014 1604   AST 27 11/14/2019 0815   AST 22 01/20/2014 1604   ALT 29 11/14/2019 0815   ALT 20 01/20/2014 1604   ALKPHOS 143 (H) 11/14/2019 0815   ALKPHOS 134 01/20/2014 1604   BILITOT 0.3 11/14/2019 0815   BILITOT <0.20 01/20/2014 1604   GFRNONAA >60 11/14/2019 0815   GFRNONAA 81 03/24/2019 1041   GFRAA >60 10/24/2019 0737   GFRAA 94 03/24/2019 1041    No results found for: SPEP, UPEP  Lab Results  Component Value Date   WBC 10.1 11/14/2019   NEUTROABS 8.3 (H) 11/14/2019   HGB 11.4 (L) 11/14/2019   HCT 33.4 (L) 11/14/2019   MCV 96.8 11/14/2019   PLT 157 11/14/2019      Chemistry      Component Value Date/Time   NA 137 11/14/2019 0815   NA 141 01/20/2014 1604   K 4.0 11/14/2019 0815   K 4.2 01/20/2014 1604   CL 103 11/14/2019 0815   CO2 25 11/14/2019 0815   CO2 28 01/20/2014 1604   BUN 11 11/14/2019 0815   BUN 11 07/31/2019 0000   BUN 14.6 01/20/2014 1604   CREATININE 0.78 11/14/2019 0815   CREATININE 0.78 03/24/2019 1041   CREATININE 0.8 01/20/2014 1604      Component Value Date/Time   CALCIUM 10.2 11/14/2019 0815   CALCIUM 9.8 01/20/2014 1604   ALKPHOS 143 (H) 11/14/2019 0815   ALKPHOS 134 01/20/2014 1604   AST 27 11/14/2019 0815   AST 22 01/20/2014 1604   ALT 29 11/14/2019 0815   ALT 20 01/20/2014 1604   BILITOT 0.3 11/14/2019 0815   BILITOT <0.20 01/20/2014 1604

## 2019-11-14 NOTE — Assessment & Plan Note (Signed)
We have extensive discussions about weight loss strategies Hopefully, by omitting oral premed the morning of chemotherapy, that would cause less weight gain

## 2019-11-14 NOTE — Patient Instructions (Signed)
[facility] Discharge Instructions for Patients Receiving Chemotherapy  Today you received the following chemotherapy agents: paclitaxel/carboplatin.  To help prevent nausea and vomiting after your treatment, we encourage you to take your nausea medication as directed.  If you develop nausea and vomiting that is not controlled by your nausea medication, call the clinic.   BELOW ARE SYMPTOMS THAT SHOULD BE REPORTED IMMEDIATELY:  *FEVER GREATER THAN 100.5 F  *CHILLS WITH OR WITHOUT FEVER  NAUSEA AND VOMITING THAT IS NOT CONTROLLED WITH YOUR NAUSEA MEDICATION  *UNUSUAL SHORTNESS OF BREATH  *UNUSUAL BRUISING OR BLEEDING  TENDERNESS IN MOUTH AND THROAT WITH OR WITHOUT PRESENCE OF ULCERS  *URINARY PROBLEMS  *BOWEL PROBLEMS  UNUSUAL RASH Items with * indicate a potential emergency and should be followed up as soon as possible.  Feel free to call the clinic should you have any questions or concerns. The clinic phone number is [phone].  Please show the CHEMO ALERT CARD at check-in to the Emergency Department and triage nurse.   

## 2019-11-20 ENCOUNTER — Encounter: Payer: Self-pay | Admitting: Family Medicine

## 2019-12-05 ENCOUNTER — Inpatient Hospital Stay (HOSPITAL_BASED_OUTPATIENT_CLINIC_OR_DEPARTMENT_OTHER): Payer: 59 | Admitting: Hematology and Oncology

## 2019-12-05 ENCOUNTER — Encounter: Payer: Self-pay | Admitting: Hematology and Oncology

## 2019-12-05 ENCOUNTER — Inpatient Hospital Stay: Payer: 59

## 2019-12-05 ENCOUNTER — Other Ambulatory Visit: Payer: Self-pay

## 2019-12-05 ENCOUNTER — Inpatient Hospital Stay: Payer: 59 | Attending: Gynecologic Oncology

## 2019-12-05 DIAGNOSIS — C541 Malignant neoplasm of endometrium: Secondary | ICD-10-CM

## 2019-12-05 DIAGNOSIS — T451X5A Adverse effect of antineoplastic and immunosuppressive drugs, initial encounter: Secondary | ICD-10-CM | POA: Diagnosis not present

## 2019-12-05 DIAGNOSIS — Z5111 Encounter for antineoplastic chemotherapy: Secondary | ICD-10-CM | POA: Insufficient documentation

## 2019-12-05 DIAGNOSIS — G620 Drug-induced polyneuropathy: Secondary | ICD-10-CM

## 2019-12-05 DIAGNOSIS — D6481 Anemia due to antineoplastic chemotherapy: Secondary | ICD-10-CM

## 2019-12-05 LAB — CBC WITH DIFFERENTIAL (CANCER CENTER ONLY)
Abs Immature Granulocytes: 0.03 10*3/uL (ref 0.00–0.07)
Basophils Absolute: 0 10*3/uL (ref 0.0–0.1)
Basophils Relative: 0 %
Eosinophils Absolute: 0 10*3/uL (ref 0.0–0.5)
Eosinophils Relative: 0 %
HCT: 31.8 % — ABNORMAL LOW (ref 36.0–46.0)
Hemoglobin: 10.9 g/dL — ABNORMAL LOW (ref 12.0–15.0)
Immature Granulocytes: 0 %
Lymphocytes Relative: 12 %
Lymphs Abs: 0.9 10*3/uL (ref 0.7–4.0)
MCH: 34.4 pg — ABNORMAL HIGH (ref 26.0–34.0)
MCHC: 34.3 g/dL (ref 30.0–36.0)
MCV: 100.3 fL — ABNORMAL HIGH (ref 80.0–100.0)
Monocytes Absolute: 0.4 10*3/uL (ref 0.1–1.0)
Monocytes Relative: 5 %
Neutro Abs: 5.9 10*3/uL (ref 1.7–7.7)
Neutrophils Relative %: 83 %
Platelet Count: 203 10*3/uL (ref 150–400)
RBC: 3.17 MIL/uL — ABNORMAL LOW (ref 3.87–5.11)
RDW: 17.2 % — ABNORMAL HIGH (ref 11.5–15.5)
WBC Count: 7.2 10*3/uL (ref 4.0–10.5)
nRBC: 0.3 % — ABNORMAL HIGH (ref 0.0–0.2)

## 2019-12-05 LAB — CMP (CANCER CENTER ONLY)
ALT: 28 U/L (ref 0–44)
AST: 27 U/L (ref 15–41)
Albumin: 3.8 g/dL (ref 3.5–5.0)
Alkaline Phosphatase: 141 U/L — ABNORMAL HIGH (ref 38–126)
Anion gap: 12 (ref 5–15)
BUN: 10 mg/dL (ref 8–23)
CO2: 23 mmol/L (ref 22–32)
Calcium: 10.2 mg/dL (ref 8.9–10.3)
Chloride: 104 mmol/L (ref 98–111)
Creatinine: 0.73 mg/dL (ref 0.44–1.00)
GFR, Estimated: >60 mL/min (ref >60)
Glucose, Bld: 133 mg/dL — ABNORMAL HIGH (ref 70–99)
Potassium: 4 mmol/L (ref 3.5–5.1)
Sodium: 139 mmol/L (ref 135–145)
Total Bilirubin: 0.3 mg/dL (ref 0.3–1.2)
Total Protein: 7.5 g/dL (ref 6.5–8.1)

## 2019-12-05 MED ORDER — DIPHENHYDRAMINE HCL 50 MG/ML IJ SOLN
25.0000 mg | Freq: Once | INTRAMUSCULAR | Status: AC
  Administered 2019-12-05: 25 mg via INTRAVENOUS

## 2019-12-05 MED ORDER — SODIUM CHLORIDE 0.9 % IV SOLN
750.0000 mg | Freq: Once | INTRAVENOUS | Status: AC
  Administered 2019-12-05: 750 mg via INTRAVENOUS
  Filled 2019-12-05: qty 75

## 2019-12-05 MED ORDER — FAMOTIDINE IN NACL 20-0.9 MG/50ML-% IV SOLN
INTRAVENOUS | Status: AC
  Filled 2019-12-05: qty 50

## 2019-12-05 MED ORDER — PALONOSETRON HCL INJECTION 0.25 MG/5ML
0.2500 mg | Freq: Once | INTRAVENOUS | Status: AC
  Administered 2019-12-05: 0.25 mg via INTRAVENOUS

## 2019-12-05 MED ORDER — DIPHENHYDRAMINE HCL 50 MG/ML IJ SOLN
INTRAMUSCULAR | Status: AC
  Filled 2019-12-05: qty 1

## 2019-12-05 MED ORDER — SODIUM CHLORIDE 0.9 % IV SOLN
140.0000 mg/m2 | Freq: Once | INTRAVENOUS | Status: AC
  Administered 2019-12-05: 324 mg via INTRAVENOUS
  Filled 2019-12-05: qty 54

## 2019-12-05 MED ORDER — SODIUM CHLORIDE 0.9% FLUSH
10.0000 mL | Freq: Once | INTRAVENOUS | Status: AC
  Administered 2019-12-05: 10 mL
  Filled 2019-12-05: qty 10

## 2019-12-05 MED ORDER — PALONOSETRON HCL INJECTION 0.25 MG/5ML
INTRAVENOUS | Status: AC
  Filled 2019-12-05: qty 5

## 2019-12-05 MED ORDER — SODIUM CHLORIDE 0.9 % IV SOLN
Freq: Once | INTRAVENOUS | Status: AC
  Filled 2019-12-05: qty 250

## 2019-12-05 MED ORDER — SODIUM CHLORIDE 0.9 % IV SOLN
10.0000 mg | Freq: Once | INTRAVENOUS | Status: AC
  Administered 2019-12-05: 10 mg via INTRAVENOUS
  Filled 2019-12-05: qty 10

## 2019-12-05 MED ORDER — SODIUM CHLORIDE 0.9% FLUSH
10.0000 mL | INTRAVENOUS | Status: DC | PRN
  Administered 2019-12-05: 10 mL
  Filled 2019-12-05: qty 10

## 2019-12-05 MED ORDER — FAMOTIDINE IN NACL 20-0.9 MG/50ML-% IV SOLN
20.0000 mg | Freq: Once | INTRAVENOUS | Status: AC
  Administered 2019-12-05: 20 mg via INTRAVENOUS

## 2019-12-05 MED ORDER — SODIUM CHLORIDE 0.9 % IV SOLN
150.0000 mg | Freq: Once | INTRAVENOUS | Status: AC
  Administered 2019-12-05: 150 mg via INTRAVENOUS
  Filled 2019-12-05: qty 150

## 2019-12-05 MED ORDER — HEPARIN SOD (PORK) LOCK FLUSH 100 UNIT/ML IV SOLN
500.0000 [IU] | Freq: Once | INTRAVENOUS | Status: AC | PRN
  Administered 2019-12-05: 500 [IU]
  Filled 2019-12-05: qty 5

## 2019-12-05 NOTE — Assessment & Plan Note (Signed)
This is likely due to recent treatment. The patient denies recent history of bleeding such as epistaxis, hematuria or hematochezia. She is asymptomatic from the anemia. I will observe for now.   

## 2019-12-05 NOTE — Progress Notes (Signed)
[place] OFFICE PROGRESS NOTE  Patient Care Team: Hali Marry, MD as PCP - General (Family Medicine) Druscilla Brownie, MD (Dermatology) Kristeen Miss, MD (Neurosurgery) Guss Bunde, MD (Obstetrics and Gynecology) Awanda Mink Craige Cotta, RN as Oncology Nurse Navigator (Oncology)  ASSESSMENT & PLAN:  Endometrial cancer [facility]) She tolerated last cycle of treatment well without major side effects except for mild fatigue, minimal neuropathy and anemia We will proceed with treatment as scheduled  Anemia due to antineoplastic chemotherapy This is likely due to recent treatment. The patient denies recent history of bleeding such as epistaxis, hematuria or hematochezia. She is asymptomatic from the anemia. I will observe for now.    Peripheral neuropathy due to chemotherapy [facility]) She will try to use ice during treatment to reduce risk of progression of neuropathy Her neuropathy is improved compared to prior visit   No orders of the defined types were placed in this encounter.   All questions were answered. The patient knows to call the clinic with any problems, questions or concerns. The total time spent in the appointment was 20 minutes encounter with patients including review of chart and various tests results, discussions about plan of care and coordination of care plan   [name], MD 12/05/2019 8:23 AM  INTERVAL HISTORY: Please see below for problem oriented charting. She returns for cycle 5 of chemotherapy She tolerated last cycle of treatment well She denies progression of peripheral neuropathy Denies nausea or constipation Her energy level is fair No recent infection, fever or chills  SUMMARY OF ONCOLOGIC HISTORY: Oncology History Overview Note  MSI Stable   Endometrial cancer ([place])  07/28/2019 Pathology Results   Endometrium, biopsy - ENDOMETRIOID CARCINOMA - SEE COMMENT Microscopic Comment Based on the biopsy, the carcinoma appears FIGO grade  1.   08/01/2019 Imaging   CT abdomen and pelvis No evidence of abdominal or pelvic metastatic disease.   Small uterine fibroid.   Colonic diverticulosis, without radiographic evidence of diverticulitis.   Aortic Atherosclerosis (ICD10-I70.0).   08/14/2019 Surgery   Surgeon: Donaciano Eva  Operation: Robotic-assisted laparoscopic total hysterectomy with bilateral salpingoophorectomy, SLN biopsy     Operative Findings:  : 8cm uterus which appeared grossly normal, normal appearing tubes and ovaries, no suspicious lymph nodes. Brisk oozing from skin incisions.    09/03/2019 Cancer Staging   Staging form: Corpus Uteri - Carcinoma and Carcinosarcoma, AJCC 8th Edition - Pathologic stage from 09/03/2019: FIGO Stage IIIA (pT3a, pN0, cM0) - Signed by [name], MD on 09/03/2019   09/04/2019 Pathology Results   FINAL MICROSCOPIC DIAGNOSIS:   A. LYMPH NODE, SENTINEL RIGHT OBTURATOR PROXIMAL, BIOPSY:  -  No carcinoma identified in one lymph node (0/1)  -  See comment   B. LYMPH NODE, SENTINEL RIGHT OBTURATOR DISTAL, BIOPSY:  -  No carcinoma identified in one lymph node (0/1)  -  See comment   C. LYMPH NODE, SENTINEL LEFT EXTERNAL ILIAC, BIOPSY:  -  No carcinoma identified in one lymph node (0/1)  -  See comment   D. UTERUS AND BILATERAL ADNEXA, HYSTERECTOMY WITH SALPINGO-OOPHORECTOMY:   Uterus:  -  Endometrioid carcinoma, FIGO grade 1  -  Leiomyoma (1.8 cm; largest)  -  See oncology table and comment below   Cervix:  -  Cervix with squamous metaplasia  -  No carcinoma identified   Bilateral Ovaries:  -  Endometrioid carcinoma involving right ovary  -  No carcinoma involving left ovary  -  Endosalpingiosis   Bilateral  Fallopian tubes:  -  No carcinoma identified   ONCOLOGY TABLE:   UTERUS, CARCINOMA OR CARCINOSARCOMA   Procedure: Total hysterectomy and bilateral salpingo-oophorectomy  Histologic type: Endometrioid carcinoma, NOS  Histologic Grade: FIGO grade 1   Myometrial invasion:       Depth of invasion: 3 mm       Myometrial thickness: 20 mm  Uterine Serosa Involvement: Not identified  Cervical stromal involvement: Not identified  Extent of involvement of other organs: Right ovary  Lymphovascular invasion: Not identified  Regional Lymph Nodes:       Examined:      3 Sentinel                               0 non-sentinel                               3 total        Lymph nodes with metastasis: 0        Isolated tumor cells (<0.2 mm): 0        Micrometastasis:  (>0.2 mm and < 2.0 mm): 0        Macrometastasis: (>2.0 mm): 0        Extracapsular extension: N/A  Representative Tumor Block: D10  MMR / MSI testing: Pending  Pathologic Stage Classification (pTNM, AJCC 8th edition):  pT3a, pN0  Comments: Pancytokeratin performed on the lymph nodes is negative. The right ovary has a focus of carcinoma.     09/11/2019 Procedure   Placement of single lumen port a cath via right internal jugular vein. The catheter tip lies at the cavo-atrial junction. A power injectable port a cath was placed and is ready for immediate use.     09/12/2019 -  Chemotherapy   The patient had carboplatin and taxol for chemotherapy treatment.       REVIEW OF SYSTEMS:   Constitutional: Denies fevers, chills or abnormal weight loss Eyes: Denies blurriness of vision Ears, nose, mouth, throat, and face: Denies mucositis or sore throat Respiratory: Denies cough, dyspnea or wheezes Cardiovascular: Denies palpitation, chest discomfort or lower extremity swelling Gastrointestinal:  Denies nausea, heartburn or change in bowel habits Skin: Denies abnormal skin rashes Lymphatics: Denies new lymphadenopathy or easy bruising Behavioral/Psych: Mood is stable, no new changes  All other systems were reviewed with the patient and are negative.  I have reviewed the past medical history, past surgical history, social history and family history with the patient and they are  unchanged from previous note.  ALLERGIES:  has No Known Allergies.  MEDICATIONS:  Current Outpatient Medications  Medication Sig Dispense Refill  . cholecalciferol (VITAMIN D) 1000 UNITS tablet Take 1,000 Units by mouth daily.      Marland Kitchen dexamethasone (DECADRON) 4 MG tablet Take 2 tabs at the night before and 2 tab the morning of chemotherapy, every 3 weeks, by mouth x 6 cycles 36 tablet 6  . DULoxetine (CYMBALTA) 60 MG capsule Take 1 capsule (60 mg total) by mouth daily. 90 capsule 1  . fluticasone (FLONASE) 50 MCG/ACT nasal spray Place 2 sprays into both nostrils daily as needed for allergies or rhinitis.    Marland Kitchen gabapentin (NEURONTIN) 300 MG capsule Take 2 capsules (600 mg total) by mouth daily. 180 capsule 2  . lidocaine-prilocaine (EMLA) cream Apply to affected area once 30 g 3  . Multiple Vitamins-Minerals (PRESERVISION AREDS)  CAPS Take 1 capsule by mouth 1 day or 1 dose.    . ondansetron (ZOFRAN) 8 MG tablet Take 1 tablet (8 mg total) by mouth every 8 (eight) hours as needed. Start on the third day after chemotherapy. 30 tablet 1  . polyethylene glycol (MIRALAX / GLYCOLAX) 17 g packet Take 17 g by mouth daily as needed.    . prochlorperazine (COMPAZINE) 10 MG tablet Take 1 tablet (10 mg total) by mouth every 6 (six) hours as needed (Nausea or vomiting). 30 tablet 1  . rOPINIRole (REQUIP) 0.5 MG tablet TAKE 1 TABLET BY MOUTH AT  BEDTIME 90 tablet 3  . vitamin C (ASCORBIC ACID) 500 MG tablet Take 500 mg by mouth daily.       No current facility-administered medications for this visit.    PHYSICAL EXAMINATION: ECOG PERFORMANCE STATUS: 1 - Symptomatic but completely ambulatory  Vitals:   12/05/19 0808  BP: (!) 144/80  Pulse: 100  Resp: 18  Temp: (!) 96.9 F (36.1 C)  SpO2: 100%   There were no vitals filed for this visit.  GENERAL:alert, no distress and comfortable SKIN: skin color, texture, turgor are normal, no rashes or significant lesions EYES: normal, Conjunctiva are pink and  non-injected, sclera clear OROPHARYNX:no exudate, no erythema and lips, buccal mucosa, and tongue normal  NECK: supple, thyroid normal size, non-tender, without nodularity LYMPH:  no palpable lymphadenopathy in the cervical, axillary or inguinal LUNGS: clear to auscultation and percussion with normal breathing effort HEART: regular rate & rhythm and no murmurs and no lower extremity edema ABDOMEN:abdomen soft, non-tender and normal bowel sounds Musculoskeletal:no cyanosis of digits and no clubbing  NEURO: alert & oriented x 3 with fluent speech, no focal motor/sensory deficits  LABORATORY DATA:  I have reviewed the data as listed    Component Value Date/Time   NA 137 11/14/2019 0815   NA 141 01/20/2014 1604   K 4.0 11/14/2019 0815   K 4.2 01/20/2014 1604   CL 103 11/14/2019 0815   CO2 25 11/14/2019 0815   CO2 28 01/20/2014 1604   GLUCOSE 180 (H) 11/14/2019 0815   GLUCOSE 99 01/20/2014 1604   BUN 11 11/14/2019 0815   BUN 11 07/31/2019 0000   BUN 14.6 01/20/2014 1604   CREATININE 0.78 11/14/2019 0815   CREATININE 0.78 03/24/2019 1041   CREATININE 0.8 01/20/2014 1604   CALCIUM 10.2 11/14/2019 0815   CALCIUM 9.8 01/20/2014 1604   PROT 7.4 11/14/2019 0815   PROT 7.3 01/20/2014 1604   ALBUMIN 3.6 11/14/2019 0815   ALBUMIN 4.0 01/20/2014 1604   AST 27 11/14/2019 0815   AST 22 01/20/2014 1604   ALT 29 11/14/2019 0815   ALT 20 01/20/2014 1604   ALKPHOS 143 (H) 11/14/2019 0815   ALKPHOS 134 01/20/2014 1604   BILITOT 0.3 11/14/2019 0815   BILITOT <0.20 01/20/2014 1604   GFRNONAA >60 11/14/2019 0815   GFRNONAA 81 03/24/2019 1041   GFRAA >60 10/24/2019 0737   GFRAA 94 03/24/2019 1041    No results found for: SPEP, UPEP  Lab Results  Component Value Date   WBC 7.2 12/05/2019   NEUTROABS 5.9 12/05/2019   HGB 10.9 (L) 12/05/2019   HCT 31.8 (L) 12/05/2019   MCV 100.3 (H) 12/05/2019   PLT 203 12/05/2019      Chemistry      Component Value Date/Time   NA 137 11/14/2019  0815   NA 141 01/20/2014 1604   K 4.0 11/14/2019 0815   K 4.2  01/20/2014 1604   CL 103 11/14/2019 0815   CO2 25 11/14/2019 0815   CO2 28 01/20/2014 1604   BUN 11 11/14/2019 0815   BUN 11 07/31/2019 0000   BUN 14.6 01/20/2014 1604   CREATININE 0.78 11/14/2019 0815   CREATININE 0.78 03/24/2019 1041   CREATININE 0.8 01/20/2014 1604      Component Value Date/Time   CALCIUM 10.2 11/14/2019 0815   CALCIUM 9.8 01/20/2014 1604   ALKPHOS 143 (H) 11/14/2019 0815   ALKPHOS 134 01/20/2014 1604   AST 27 11/14/2019 0815   AST 22 01/20/2014 1604   ALT 29 11/14/2019 0815   ALT 20 01/20/2014 1604   BILITOT 0.3 11/14/2019 0815   BILITOT <0.20 01/20/2014 1604

## 2019-12-05 NOTE — Patient Instructions (Signed)
[facility] Discharge Instructions for Patients Receiving Chemotherapy  Today you received the following chemotherapy agents: paclitaxel/carboplatin.  To help prevent nausea and vomiting after your treatment, we encourage you to take your nausea medication as directed.  If you develop nausea and vomiting that is not controlled by your nausea medication, call the clinic.   BELOW ARE SYMPTOMS THAT SHOULD BE REPORTED IMMEDIATELY:  *FEVER GREATER THAN 100.5 F  *CHILLS WITH OR WITHOUT FEVER  NAUSEA AND VOMITING THAT IS NOT CONTROLLED WITH YOUR NAUSEA MEDICATION  *UNUSUAL SHORTNESS OF BREATH  *UNUSUAL BRUISING OR BLEEDING  TENDERNESS IN MOUTH AND THROAT WITH OR WITHOUT PRESENCE OF ULCERS  *URINARY PROBLEMS  *BOWEL PROBLEMS  UNUSUAL RASH Items with * indicate a potential emergency and should be followed up as soon as possible.  Feel free to call the clinic should you have any questions or concerns. The clinic phone number is [phone].  Please show the CHEMO ALERT CARD at check-in to the Emergency Department and triage nurse.   

## 2019-12-05 NOTE — Assessment & Plan Note (Signed)
She will try to use ice during treatment to reduce risk of progression of neuropathy Her neuropathy is improved compared to prior visit

## 2019-12-05 NOTE — Assessment & Plan Note (Signed)
She tolerated last cycle of treatment well without major side effects except for mild fatigue, minimal neuropathy and anemia We will proceed with treatment as scheduled

## 2019-12-22 ENCOUNTER — Encounter: Payer: Self-pay | Admitting: Family Medicine

## 2019-12-22 ENCOUNTER — Ambulatory Visit (INDEPENDENT_AMBULATORY_CARE_PROVIDER_SITE_OTHER): Payer: 59 | Admitting: Family Medicine

## 2019-12-22 VITALS — BP 129/68 | HR 96 | Temp 98.9°F | Wt 261.0 lb

## 2019-12-22 DIAGNOSIS — G4733 Obstructive sleep apnea (adult) (pediatric): Secondary | ICD-10-CM | POA: Diagnosis not present

## 2019-12-22 DIAGNOSIS — R0602 Shortness of breath: Secondary | ICD-10-CM

## 2019-12-22 DIAGNOSIS — R7301 Impaired fasting glucose: Secondary | ICD-10-CM | POA: Diagnosis not present

## 2019-12-22 DIAGNOSIS — Z6841 Body Mass Index (BMI) 40.0 and over, adult: Secondary | ICD-10-CM

## 2019-12-22 LAB — POCT GLYCOSYLATED HEMOGLOBIN (HGB A1C): Hemoglobin A1C: 5.7 % — AB (ref 4.0–5.6)

## 2019-12-22 MED ORDER — AMBULATORY NON FORMULARY MEDICATION: 0 refills | Status: DC

## 2019-12-22 NOTE — Assessment & Plan Note (Signed)
A1c looks phenomenal today at 5.7 considering that she has been on steroids before chemotherapy treatments.  Continue current regimen continue to work on making this healthy food choices and getting enough protein and on a regular basis.

## 2019-12-22 NOTE — Assessment & Plan Note (Signed)
Will place new order for CPAP machine through [facility] such as 11 cm of water pressure with humidifier.  Attach sleep study from 2015.

## 2019-12-22 NOTE — Assessment & Plan Note (Addendum)
After the new year after her last chemo and having some time to recover from that we can start focusing back on  her weight again and try to get that under control.

## 2019-12-22 NOTE — Progress Notes (Signed)
Established Patient Office Visit  Subjective:  Patient ID: Patricia Waters, female    DOB: 02-27-1955  Age: 64 y.o. MRN: [id number]  CC:  Chief Complaint  Patient presents with  . CPAP    HPI Patricia Waters presents for   Impaired fasting glucose-no increased thirst or urination. No symptoms consistent with hypoglycemia.  He has been taking dexamethasone before her chemotherapy treatments.  The highest blood sugar that they have seen is 180.  She would also like to get an updated CPAP machine.  The one that she has is currently working but it sounds like some of the components are starting to burn inside of it and she is worried #1 about bringing that in and #2 that it is on the verge of quitting working.  It is approximately 62 to 63 years old.  She currently gets her supplies and machine through [facility].  She does wear her CPAP every night, all night long.  So she wears it well past 4 hours nightly.  She said at 11 cm of water pressure and does use humidifier.  Also complains of some increase shortness of breath since starting chemotherapy for her endometrial cancer she just gets winded very easily.  She is mildly anemic with a hemoglobin of 10.  She was recently diagnosed with endometrial cancer and is undergoing chemotherapy she still has 1 chemotherapy session left right after Thanksgiving.  And she will have completed 6 courses.  She has a prior history of breast cancer.  So far she has been doing fairly well she says she feels pretty rundown for about a week after chemo and then starts to feel better.  Past Medical History:  Diagnosis Date  . Arthritis   . Breast cancer ([place])    at age 14  . Family history of breast cancer   . Family history of colon cancer   . Family history of prostate cancer in father   . Family history of uterine cancer   . History of radiation therapy   . Lymph edema    right arm   . PONV (postoperative nausea and vomiting)   . Pre-diabetes   .  Sleep apnea    pt has nose pillows for cpap per pt - will bring DOS   . Vertigo     Past Surgical History:  Procedure Laterality Date  . BACK SURGERY    . BREAST LUMPECTOMY  1994   right  . CERVICAL FUSION  1995   C5-C7  . IR IMAGING GUIDED PORT INSERTION  09/11/2019  . MASTECTOMY  2005   Bilat   . ROBOTIC ASSISTED TOTAL HYSTERECTOMY WITH BILATERAL SALPINGO OOPHERECTOMY N/A 08/14/2019   Procedure: XI ROBOTIC ASSISTED TOTAL HYSTERECTOMY WITH BILATERAL SALPINGO OOPHORECTOMY;  Surgeon: [name], MD;  Location: [facility];  Service: Gynecology;  Laterality: N/A;  . SENTINEL NODE BIOPSY N/A 08/14/2019   Procedure: SENTINEL NODE BIOPSY;  Surgeon: [name], MD;  Location: [facility];  Service: Gynecology;  Laterality: N/A;    Family History  Problem Relation Age of Onset  . Stroke Mother   . Hyperlipidemia Mother   . Skin cancer Mother   . Breast cancer Mother 43       ? recurrence at 55  . Endometrial cancer Mother 84  . Diabetes Father   . Hyperlipidemia Father   . Heart attack Father 42  . Colon cancer Father 21  . Prostate cancer Father 80  Gleason 9  . Diabetes Paternal Grandmother   . Heart attack Paternal Grandmother   . Rheum arthritis Paternal Grandmother   . Hyperlipidemia Sister   . Heart attack Paternal Grandfather   . Heart attack Maternal Grandfather   . Heart attack Maternal Grandmother   . Skin cancer Maternal Grandmother   . Rheum arthritis Maternal Grandmother   . Breast cancer Sister 63  . Lymphoma Maternal Aunt 65  . Kidney cancer Maternal Aunt 60  . Cervical cancer Cousin 42    Social History   Socioeconomic History  . Marital status: Married    Spouse name: [name]  . Number of children: Not on file  . Years of education: Not on file  . Highest education level: Not on file  Occupational History  . Occupation: Radio broadcast assistant: [employer]    Comment: [employer]  Tobacco Use  . Smoking status: Former Smoker    Types: Cigarettes    Quit  date: 10/31/2006    Years since quitting: 13.1  . Smokeless tobacco: Never Used  Vaping Use  . Vaping Use: Never used  Substance and Sexual Activity  . Alcohol use: Not Currently  . Drug use: No  . Sexual activity: Not Currently    Partners: Female    Birth control/protection: None  Other Topics Concern  . Not on file  Social History Narrative   Lives with her partner, [name]. Former Therapist, nutritional.    Social Determinants of Health   Financial Resource Strain:   . Difficulty of Paying Living Expenses: Not on file  Food Insecurity:   . Worried About Charity fundraiser in the Last Year: Not on file  . Ran Out of Food in the Last Year: Not on file  Transportation Needs:   . Lack of Transportation (Medical): Not on file  . Lack of Transportation (Non-Medical): Not on file  Physical Activity:   . Days of Exercise per Week: Not on file  . Minutes of Exercise per Session: Not on file  Stress:   . Feeling of Stress : Not on file  Social Connections:   . Frequency of Communication with Friends and Family: Not on file  . Frequency of Social Gatherings with Friends and Family: Not on file  . Attends Religious Services: Not on file  . Active Member of Clubs or Organizations: Not on file  . Attends Archivist Meetings: Not on file  . Marital Status: Not on file  Intimate Partner Violence:   . Fear of Current or Ex-Partner: Not on file  . Emotionally Abused: Not on file  . Physically Abused: Not on file  . Sexually Abused: Not on file    Outpatient Medications Prior to Visit  Medication Sig Dispense Refill  . cholecalciferol (VITAMIN D) 1000 UNITS tablet Take 1,000 Units by mouth daily.      Marland Kitchen dexamethasone (DECADRON) 4 MG tablet Take 2 tabs at the night before and 2 tab the morning of chemotherapy, every 3 weeks, by mouth x 6 cycles 36 tablet 6  . DULoxetine (CYMBALTA) 60 MG capsule Take 1 capsule (60 mg total) by mouth daily. 90 capsule 1  . fluticasone (FLONASE) 50  MCG/ACT nasal spray Place 2 sprays into both nostrils daily as needed for allergies or rhinitis.    Marland Kitchen gabapentin (NEURONTIN) 300 MG capsule Take 2 capsules (600 mg total) by mouth daily. 180 capsule 2  . lidocaine-prilocaine (EMLA) cream Apply to affected area once 30 g 3  .  Multiple Vitamins-Minerals (PRESERVISION AREDS) CAPS Take 1 capsule by mouth 1 day or 1 dose.    . ondansetron (ZOFRAN) 8 MG tablet Take 1 tablet (8 mg total) by mouth every 8 (eight) hours as needed. Start on the third day after chemotherapy. 30 tablet 1  . polyethylene glycol (MIRALAX / GLYCOLAX) 17 g packet Take 17 g by mouth daily as needed.    . prochlorperazine (COMPAZINE) 10 MG tablet Take 1 tablet (10 mg total) by mouth every 6 (six) hours as needed (Nausea or vomiting). 30 tablet 1  . rOPINIRole (REQUIP) 0.5 MG tablet TAKE 1 TABLET BY MOUTH AT  BEDTIME 90 tablet 3  . vitamin C (ASCORBIC ACID) 500 MG tablet Take 500 mg by mouth daily.       No facility-administered medications prior to visit.    No Known Allergies  ROS Review of Systems    Objective:    Physical Exam Constitutional:      Appearance: She is well-developed.  HENT:     Head: Normocephalic and atraumatic.  Cardiovascular:     Rate and Rhythm: Normal rate and regular rhythm.     Heart sounds: Normal heart sounds.  Pulmonary:     Effort: Pulmonary effort is normal.     Breath sounds: Normal breath sounds.  Skin:    General: Skin is warm and dry.  Neurological:     Mental Status: She is alert and oriented to person, place, and time.  Psychiatric:        Behavior: Behavior normal.     BP 129/68 (BP Location: Left Arm, Patient Position: Sitting, Cuff Size: Large)   Pulse 96   Temp 98.9 F (37.2 C)   Wt 261 lb (118.4 kg)   SpO2 98%   BMI 43.43 kg/m  Wt Readings from Last 3 Encounters:  12/22/19 261 lb (118.4 kg)  11/14/19 261 lb 3.2 oz (118.5 kg)  10/24/19 254 lb 9.6 oz (115.5 kg)     There are no preventive care reminders to  display for this patient.  There are no preventive care reminders to display for this patient.  Lab Results  Component Value Date   TSH 2.88 09/07/2015   Lab Results  Component Value Date   WBC 7.2 12/05/2019   HGB 10.9 (L) 12/05/2019   HCT 31.8 (L) 12/05/2019   MCV 100.3 (H) 12/05/2019   PLT 203 12/05/2019   Lab Results  Component Value Date   NA 139 12/05/2019   K 4.0 12/05/2019   CHLORIDE 103 01/20/2014   CO2 23 12/05/2019   GLUCOSE 133 (H) 12/05/2019   BUN 10 12/05/2019   CREATININE 0.73 12/05/2019   BILITOT 0.3 12/05/2019   ALKPHOS 141 (H) 12/05/2019   AST 27 12/05/2019   ALT 28 12/05/2019   PROT 7.5 12/05/2019   ALBUMIN 3.8 12/05/2019   CALCIUM 10.2 12/05/2019   ANIONGAP 12 12/05/2019   EGFR 77 (L) 01/20/2014   Lab Results  Component Value Date   CHOL 204 (H) 03/24/2019   Lab Results  Component Value Date   HDL 61 03/24/2019   Lab Results  Component Value Date   LDLCALC 111 (H) 03/24/2019   Lab Results  Component Value Date   TRIG 197 (H) 03/24/2019   Lab Results  Component Value Date   CHOLHDL 3.3 03/24/2019   Lab Results  Component Value Date   HGBA1C 5.7 (A) 12/22/2019      Assessment & Plan:   Problem List Items Addressed This  Visit      Respiratory   Sleep apnea, obstructive    Will place new order for CPAP machine through [facility] such as 11 cm of water pressure with humidifier.  Attach sleep study from 2015.      Relevant Medications   AMBULATORY NON FORMULARY MEDICATION     Endocrine   IFG (impaired fasting glucose) - Primary    A1c looks phenomenal today at 5.7 considering that she has been on steroids before chemotherapy treatments.  Continue current regimen continue to work on making this healthy food choices and getting enough protein and on a regular basis.      Relevant Orders   POCT glycosylated hemoglobin (Hb A1C) (Completed)     Other   BMI 40.0-44.9, adult ([place])    After the new year after her last chemo  and having some time to recover from that we can start focusing back on  her weight again and try to get that under control.       Other Visit Diagnoses    SOB (shortness of breath)          SOB - discuss that I think some of her shortness of breath is just related to fatigue of chemotherapy as well as her mild anemia.  But certainly I am happy to work-up further especially over the next month or 2 if she is rebounding and getting her energy level back if she still struggling with shortness of breath we can schedule her for spirometry and further work-up if needed.  Meds ordered this encounter  Medications  . AMBULATORY NON FORMULARY MEDICATION    Sig: Medication Name: CPAP with humidifier with 11 cm water pressure.  Dx OSA.  See attached Sleep Study.  Please fax to [facility].    Dispense:  1 Units    Refill:  0    Follow-up: No follow-ups on file.    Beatrice Lecher, MD

## 2019-12-26 ENCOUNTER — Inpatient Hospital Stay (HOSPITAL_BASED_OUTPATIENT_CLINIC_OR_DEPARTMENT_OTHER): Payer: 59 | Admitting: Hematology and Oncology

## 2019-12-26 ENCOUNTER — Inpatient Hospital Stay: Payer: 59

## 2019-12-26 ENCOUNTER — Other Ambulatory Visit: Payer: Self-pay

## 2019-12-26 ENCOUNTER — Encounter: Payer: Self-pay | Admitting: Hematology and Oncology

## 2019-12-26 VITALS — BP 143/77 | HR 98 | Temp 97.5°F | Resp 18 | Ht 65.0 in | Wt 257.2 lb

## 2019-12-26 DIAGNOSIS — Z6841 Body Mass Index (BMI) 40.0 and over, adult: Secondary | ICD-10-CM | POA: Diagnosis not present

## 2019-12-26 DIAGNOSIS — T451X5A Adverse effect of antineoplastic and immunosuppressive drugs, initial encounter: Secondary | ICD-10-CM

## 2019-12-26 DIAGNOSIS — D61818 Other pancytopenia: Secondary | ICD-10-CM | POA: Insufficient documentation

## 2019-12-26 DIAGNOSIS — C541 Malignant neoplasm of endometrium: Secondary | ICD-10-CM

## 2019-12-26 DIAGNOSIS — G620 Drug-induced polyneuropathy: Secondary | ICD-10-CM

## 2019-12-26 DIAGNOSIS — Z5111 Encounter for antineoplastic chemotherapy: Secondary | ICD-10-CM | POA: Diagnosis not present

## 2019-12-26 LAB — CBC WITH DIFFERENTIAL (CANCER CENTER ONLY)
Abs Immature Granulocytes: 0.06 10*3/uL (ref 0.00–0.07)
Basophils Absolute: 0 10*3/uL (ref 0.0–0.1)
Basophils Relative: 0 %
Eosinophils Absolute: 0 10*3/uL (ref 0.0–0.5)
Eosinophils Relative: 0 %
HCT: 26.8 % — ABNORMAL LOW (ref 36.0–46.0)
Hemoglobin: 9.3 g/dL — ABNORMAL LOW (ref 12.0–15.0)
Immature Granulocytes: 1 %
Lymphocytes Relative: 12 %
Lymphs Abs: 1 10*3/uL (ref 0.7–4.0)
MCH: 36.8 pg — ABNORMAL HIGH (ref 26.0–34.0)
MCHC: 34.7 g/dL (ref 30.0–36.0)
MCV: 105.9 fL — ABNORMAL HIGH (ref 80.0–100.0)
Monocytes Absolute: 0.5 10*3/uL (ref 0.1–1.0)
Monocytes Relative: 6 %
Neutro Abs: 6.7 10*3/uL (ref 1.7–7.7)
Neutrophils Relative %: 81 %
Platelet Count: 121 10*3/uL — ABNORMAL LOW (ref 150–400)
RBC: 2.53 MIL/uL — ABNORMAL LOW (ref 3.87–5.11)
RDW: 17 % — ABNORMAL HIGH (ref 11.5–15.5)
WBC Count: 8.3 10*3/uL (ref 4.0–10.5)
nRBC: 0.2 % (ref 0.0–0.2)

## 2019-12-26 LAB — CMP (CANCER CENTER ONLY)
ALT: 23 U/L (ref 0–44)
AST: 24 U/L (ref 15–41)
Albumin: 3.7 g/dL (ref 3.5–5.0)
Alkaline Phosphatase: 145 U/L — ABNORMAL HIGH (ref 38–126)
Anion gap: 13 (ref 5–15)
BUN: 12 mg/dL (ref 8–23)
CO2: 22 mmol/L (ref 22–32)
Calcium: 10.2 mg/dL (ref 8.9–10.3)
Chloride: 104 mmol/L (ref 98–111)
Creatinine: 0.75 mg/dL (ref 0.44–1.00)
GFR, Estimated: >60 mL/min (ref >60)
Glucose, Bld: 128 mg/dL — ABNORMAL HIGH (ref 70–99)
Potassium: 3.9 mmol/L (ref 3.5–5.1)
Sodium: 139 mmol/L (ref 135–145)
Total Bilirubin: 0.3 mg/dL (ref 0.3–1.2)
Total Protein: 7.5 g/dL (ref 6.5–8.1)

## 2019-12-26 MED ORDER — FAMOTIDINE IN NACL 20-0.9 MG/50ML-% IV SOLN
INTRAVENOUS | Status: AC
  Filled 2019-12-26: qty 50

## 2019-12-26 MED ORDER — PALONOSETRON HCL INJECTION 0.25 MG/5ML
INTRAVENOUS | Status: AC
  Filled 2019-12-26: qty 5

## 2019-12-26 MED ORDER — HEPARIN SOD (PORK) LOCK FLUSH 100 UNIT/ML IV SOLN
500.0000 [IU] | Freq: Once | INTRAVENOUS | Status: AC | PRN
  Administered 2019-12-26: 500 [IU]
  Filled 2019-12-26: qty 5

## 2019-12-26 MED ORDER — SODIUM CHLORIDE 0.9 % IV SOLN
150.0000 mg | Freq: Once | INTRAVENOUS | Status: AC
  Administered 2019-12-26: 150 mg via INTRAVENOUS
  Filled 2019-12-26: qty 150

## 2019-12-26 MED ORDER — DIPHENHYDRAMINE HCL 50 MG/ML IJ SOLN
25.0000 mg | Freq: Once | INTRAMUSCULAR | Status: AC
  Administered 2019-12-26: 25 mg via INTRAVENOUS

## 2019-12-26 MED ORDER — FAMOTIDINE IN NACL 20-0.9 MG/50ML-% IV SOLN
20.0000 mg | Freq: Once | INTRAVENOUS | Status: AC
  Administered 2019-12-26: 20 mg via INTRAVENOUS

## 2019-12-26 MED ORDER — SODIUM CHLORIDE 0.9 % IV SOLN
10.0000 mg | Freq: Once | INTRAVENOUS | Status: AC
  Administered 2019-12-26: 10 mg via INTRAVENOUS
  Filled 2019-12-26: qty 10

## 2019-12-26 MED ORDER — SODIUM CHLORIDE 0.9 % IV SOLN
750.0000 mg | Freq: Once | INTRAVENOUS | Status: AC
  Administered 2019-12-26: 750 mg via INTRAVENOUS
  Filled 2019-12-26: qty 75

## 2019-12-26 MED ORDER — SODIUM CHLORIDE 0.9 % IV SOLN
140.0000 mg/m2 | Freq: Once | INTRAVENOUS | Status: AC
  Administered 2019-12-26: 324 mg via INTRAVENOUS
  Filled 2019-12-26: qty 54

## 2019-12-26 MED ORDER — DIPHENHYDRAMINE HCL 50 MG/ML IJ SOLN
INTRAMUSCULAR | Status: AC
  Filled 2019-12-26: qty 1

## 2019-12-26 MED ORDER — PALONOSETRON HCL INJECTION 0.25 MG/5ML
0.2500 mg | Freq: Once | INTRAVENOUS | Status: AC
  Administered 2019-12-26: 0.25 mg via INTRAVENOUS

## 2019-12-26 MED ORDER — SODIUM CHLORIDE 0.9% FLUSH
10.0000 mL | Freq: Once | INTRAVENOUS | Status: AC
  Administered 2019-12-26: 10 mL
  Filled 2019-12-26: qty 10

## 2019-12-26 MED ORDER — SODIUM CHLORIDE 0.9% FLUSH
10.0000 mL | INTRAVENOUS | Status: DC | PRN
  Administered 2019-12-26: 10 mL
  Filled 2019-12-26: qty 10

## 2019-12-26 MED ORDER — SODIUM CHLORIDE 0.9 % IV SOLN
Freq: Once | INTRAVENOUS | Status: AC
  Filled 2019-12-26: qty 250

## 2019-12-26 NOTE — Patient Instructions (Signed)
[facility] Discharge Instructions for Patients Receiving Chemotherapy  Today you received the following chemotherapy agents: paclitaxel/carboplatin.  To help prevent nausea and vomiting after your treatment, we encourage you to take your nausea medication as directed.  If you develop nausea and vomiting that is not controlled by your nausea medication, call the clinic.   BELOW ARE SYMPTOMS THAT SHOULD BE REPORTED IMMEDIATELY:  *FEVER GREATER THAN 100.5 F  *CHILLS WITH OR WITHOUT FEVER  NAUSEA AND VOMITING THAT IS NOT CONTROLLED WITH YOUR NAUSEA MEDICATION  *UNUSUAL SHORTNESS OF BREATH  *UNUSUAL BRUISING OR BLEEDING  TENDERNESS IN MOUTH AND THROAT WITH OR WITHOUT PRESENCE OF ULCERS  *URINARY PROBLEMS  *BOWEL PROBLEMS  UNUSUAL RASH Items with * indicate a potential emergency and should be followed up as soon as possible.  Feel free to call the clinic should you have any questions or concerns. The clinic phone number is [phone].  Please show the CHEMO ALERT CARD at check-in to the Emergency Department and triage nurse.   

## 2019-12-26 NOTE — Assessment & Plan Note (Signed)
We discussed the association between class III obesity and increased risk of cancer recurrence She is interested for referral to medical weight management center

## 2019-12-26 NOTE — Assessment & Plan Note (Signed)
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

## 2019-12-26 NOTE — Progress Notes (Signed)
[facility] OFFICE PROGRESS NOTE  Patient Care Team: Patricia Marry, MD as PCP - General (Family Medicine) Druscilla Brownie, MD (Dermatology) Kristeen Miss, MD (Neurosurgery) Guss Bunde, MD (Obstetrics and Gynecology) Awanda Mink Craige Cotta, RN as Oncology Nurse Navigator (Oncology)  ASSESSMENT & PLAN:  Endometrial cancer [facility]) She tolerated last cycle of treatment well without major side effects except for mild fatigue, minimal neuropathy and anemia We will proceed with treatment as scheduled In the month, I will see her back with repeat blood work and CT imaging She had genetic testing that came back unremarkable  Pancytopenia, acquired [facility]) She has mild progressive pancytopenia likely due to bone marrow suppression from treatment She is not symptomatic We will proceed with treatment as scheduled I will repeat her blood work again in a month for further follow-up  BMI 40.0-44.9, adult ([place]) We discussed the association between class III obesity and increased risk of cancer recurrence She is interested for referral to medical weight management center  Peripheral neuropathy due to chemotherapy [facility]) She will try to use ice during treatment to reduce risk of progression of neuropathy Her neuropathy is stable compared to prior visit We discussed the risk and benefits of referral to physical therapy and rehab and she is in agreement   Orders Placed This Encounter  Procedures  . CT ABDOMEN PELVIS W CONTRAST    Standing Status:   Future    Standing Expiration Date:   12/25/2020    Order Specific Question:   If indicated for the ordered procedure, I authorize the administration of contrast media per Radiology protocol    Answer:   Yes    Order Specific Question:   Preferred imaging location?    Answer:   [facility]    Order Specific Question:   Radiology Contrast Protocol - do NOT remove file path    Answer:    [email or web address]  . Amb Ref to Medical Weight Management    Referral Priority:   Routine    Referral Type:   Consultation    Number of Visits Requested:   1  . Ambulatory referral to Physical Therapy    Referral Priority:   Routine    Referral Type:   Physical Medicine    Referral Reason:   Specialty Services Required    Requested Specialty:   Physical Therapy    Number of Visits Requested:   1    All questions were answered. The patient knows to call the clinic with any problems, questions or concerns. The total time spent in the appointment was 40 minutes encounter with patients including review of chart and various tests results, discussions about plan of care and coordination of care plan   [name], MD 12/26/2019 10:48 AM  INTERVAL HISTORY: Please see below for problem oriented charting. She is seen prior to cycle 6 of treatment She denies worsening peripheral neuropathy No recent infection, fever or chills Appetite is fair No recent nausea or constipation She has numerous questions related to social distancing, risk of infection and plan moving forward  SUMMARY OF ONCOLOGIC HISTORY: Oncology History Overview Note  MSI Stable Negative genetics   Endometrial cancer ([place])  07/28/2019 Pathology Results   Endometrium, biopsy - ENDOMETRIOID CARCINOMA - SEE COMMENT Microscopic Comment Based on the biopsy, the carcinoma appears FIGO grade 1.   08/01/2019 Imaging   CT abdomen and pelvis No evidence of abdominal or pelvic metastatic disease.   Small uterine fibroid.   Colonic diverticulosis, without  radiographic evidence of diverticulitis.   Aortic Atherosclerosis (ICD10-I70.0).   08/14/2019 Surgery   Surgeon: Donaciano Eva  Operation: Robotic-assisted laparoscopic total hysterectomy with bilateral salpingoophorectomy, SLN biopsy     Operative Findings:  : 8cm uterus which appeared grossly normal, normal appearing tubes  and ovaries, no suspicious lymph nodes. Brisk oozing from skin incisions.    09/03/2019 Cancer Staging   Staging form: Corpus Uteri - Carcinoma and Carcinosarcoma, AJCC 8th Edition - Pathologic stage from 09/03/2019: FIGO Stage IIIA (pT3a, pN0, cM0) - Signed by [name], MD on 09/03/2019   09/04/2019 Pathology Results   FINAL MICROSCOPIC DIAGNOSIS:   A. LYMPH NODE, SENTINEL RIGHT OBTURATOR PROXIMAL, BIOPSY:  -  No carcinoma identified in one lymph node (0/1)  -  See comment   B. LYMPH NODE, SENTINEL RIGHT OBTURATOR DISTAL, BIOPSY:  -  No carcinoma identified in one lymph node (0/1)  -  See comment   C. LYMPH NODE, SENTINEL LEFT EXTERNAL ILIAC, BIOPSY:  -  No carcinoma identified in one lymph node (0/1)  -  See comment   D. UTERUS AND BILATERAL ADNEXA, HYSTERECTOMY WITH SALPINGO-OOPHORECTOMY:   Uterus:  -  Endometrioid carcinoma, FIGO grade 1  -  Leiomyoma (1.8 cm; largest)  -  See oncology table and comment below   Cervix:  -  Cervix with squamous metaplasia  -  No carcinoma identified   Bilateral Ovaries:  -  Endometrioid carcinoma involving right ovary  -  No carcinoma involving left ovary  -  Endosalpingiosis   Bilateral Fallopian tubes:  -  No carcinoma identified   ONCOLOGY TABLE:   UTERUS, CARCINOMA OR CARCINOSARCOMA   Procedure: Total hysterectomy and bilateral salpingo-oophorectomy  Histologic type: Endometrioid carcinoma, NOS  Histologic Grade: FIGO grade 1  Myometrial invasion:       Depth of invasion: 3 mm       Myometrial thickness: 20 mm  Uterine Serosa Involvement: Not identified  Cervical stromal involvement: Not identified  Extent of involvement of other organs: Right ovary  Lymphovascular invasion: Not identified  Regional Lymph Nodes:       Examined:      3 Sentinel                               0 non-sentinel                               3 total        Lymph nodes with metastasis: 0        Isolated tumor cells (<0.2 mm): 0         Micrometastasis:  (>0.2 mm and < 2.0 mm): 0        Macrometastasis: (>2.0 mm): 0        Extracapsular extension: N/A  Representative Tumor Block: D10  MMR / MSI testing: Pending  Pathologic Stage Classification (pTNM, AJCC 8th edition):  pT3a, pN0  Comments: Pancytokeratin performed on the lymph nodes is negative. The right ovary has a focus of carcinoma.     09/11/2019 Procedure   Placement of single lumen port a cath via right internal jugular vein. The catheter tip lies at the cavo-atrial junction. A power injectable port a cath was placed and is ready for immediate use.     09/12/2019 -  Chemotherapy   The patient had carboplatin and taxol for chemotherapy treatment.  REVIEW OF SYSTEMS:   Constitutional: Denies fevers, chills or abnormal weight loss Eyes: Denies blurriness of vision Ears, nose, mouth, throat, and face: Denies mucositis or sore throat Respiratory: Denies cough, dyspnea or wheezes Cardiovascular: Denies palpitation, chest discomfort or lower extremity swelling Gastrointestinal:  Denies nausea, heartburn or change in bowel habits Skin: Denies abnormal skin rashes Lymphatics: Denies new lymphadenopathy or easy bruising Behavioral/Psych: Mood is stable, no new changes  All other systems were reviewed with the patient and are negative.  I have reviewed the past medical history, past surgical history, social history and family history with the patient and they are unchanged from previous note.  ALLERGIES:  has No Known Allergies.  MEDICATIONS:  Current Outpatient Medications  Medication Sig Dispense Refill  . AMBULATORY NON FORMULARY MEDICATION Medication Name: CPAP with humidifier with 11 cm water pressure.  Dx OSA.  See attached Sleep Study.  Please fax to [facility]. 1 Units 0  . cholecalciferol (VITAMIN D) 1000 UNITS tablet Take 1,000 Units by mouth daily.      Marland Kitchen dexamethasone (DECADRON) 4 MG tablet Take 2 tabs at the night before and 2 tab the morning of  chemotherapy, every 3 weeks, by mouth x 6 cycles 36 tablet 6  . DULoxetine (CYMBALTA) 60 MG capsule Take 1 capsule (60 mg total) by mouth daily. 90 capsule 1  . fluticasone (FLONASE) 50 MCG/ACT nasal spray Place 2 sprays into both nostrils daily as needed for allergies or rhinitis.    Marland Kitchen gabapentin (NEURONTIN) 300 MG capsule Take 2 capsules (600 mg total) by mouth daily. 180 capsule 2  . lidocaine-prilocaine (EMLA) cream Apply to affected area once 30 g 3  . Multiple Vitamins-Minerals (PRESERVISION AREDS) CAPS Take 1 capsule by mouth 1 day or 1 dose.    . ondansetron (ZOFRAN) 8 MG tablet Take 1 tablet (8 mg total) by mouth every 8 (eight) hours as needed. Start on the third day after chemotherapy. 30 tablet 1  . polyethylene glycol (MIRALAX / GLYCOLAX) 17 g packet Take 17 g by mouth daily as needed.    . prochlorperazine (COMPAZINE) 10 MG tablet Take 1 tablet (10 mg total) by mouth every 6 (six) hours as needed (Nausea or vomiting). 30 tablet 1  . rOPINIRole (REQUIP) 0.5 MG tablet TAKE 1 TABLET BY MOUTH AT  BEDTIME 90 tablet 3  . vitamin C (ASCORBIC ACID) 500 MG tablet Take 500 mg by mouth daily.       No current facility-administered medications for this visit.   Facility-Administered Medications Ordered in Other Visits  Medication Dose Route Frequency Provider Last Rate Last Admin  . CARBOplatin (PARAPLATIN) 750 mg in sodium chloride 0.9 % 250 mL chemo infusion  750 mg Intravenous Once [name], Ratasha Fabre, MD      . heparin lock flush 100 unit/mL  500 Units Intracatheter Once PRN [name], Shawnell Dykes, MD      . PACLitaxel (TAXOL) 324 mg in sodium chloride 0.9 % 500 mL chemo infusion (> 6m/m2)  140 mg/m2 (Treatment Plan Recorded) Intravenous Once GHeath Lark MD 185 mL/hr at 12/26/19 1014 324 mg at 12/26/19 1014  . sodium chloride flush (NS) 0.9 % injection 10 mL  10 mL Intracatheter PRN GAlvy Bimler Jasiah Elsen, MD        PHYSICAL EXAMINATION: ECOG PERFORMANCE STATUS: 1 - Symptomatic but completely ambulatory  Vitals:    12/26/19 0809  BP: (!) 143/77  Pulse: 98  Resp: 18  Temp: (!) 97.5 F (36.4 C)  SpO2: 98%   Filed  Weights   12/26/19 0809  Weight: 257 lb 3.2 oz (116.7 kg)    GENERAL:alert, no distress and comfortable Musculoskeletal:no cyanosis of digits and no clubbing  NEURO: alert & oriented x 3 with fluent speech, no focal motor/sensory deficits  LABORATORY DATA:  I have reviewed the data as listed    Component Value Date/Time   NA 139 12/26/2019 0801   NA 141 01/20/2014 1604   K 3.9 12/26/2019 0801   K 4.2 01/20/2014 1604   CL 104 12/26/2019 0801   CO2 22 12/26/2019 0801   CO2 28 01/20/2014 1604   GLUCOSE 128 (H) 12/26/2019 0801   GLUCOSE 99 01/20/2014 1604   BUN 12 12/26/2019 0801   BUN 11 07/31/2019 0000   BUN 14.6 01/20/2014 1604   CREATININE 0.75 12/26/2019 0801   CREATININE 0.78 03/24/2019 1041   CREATININE 0.8 01/20/2014 1604   CALCIUM 10.2 12/26/2019 0801   CALCIUM 9.8 01/20/2014 1604   PROT 7.5 12/26/2019 0801   PROT 7.3 01/20/2014 1604   ALBUMIN 3.7 12/26/2019 0801   ALBUMIN 4.0 01/20/2014 1604   AST 24 12/26/2019 0801   AST 22 01/20/2014 1604   ALT 23 12/26/2019 0801   ALT 20 01/20/2014 1604   ALKPHOS 145 (H) 12/26/2019 0801   ALKPHOS 134 01/20/2014 1604   BILITOT 0.3 12/26/2019 0801   BILITOT <0.20 01/20/2014 1604   GFRNONAA >60 12/26/2019 0801   GFRNONAA 81 03/24/2019 1041   GFRAA >60 10/24/2019 0737   GFRAA 94 03/24/2019 1041    No results found for: SPEP, UPEP  Lab Results  Component Value Date   WBC 8.3 12/26/2019   NEUTROABS 6.7 12/26/2019   HGB 9.3 (L) 12/26/2019   HCT 26.8 (L) 12/26/2019   MCV 105.9 (H) 12/26/2019   PLT 121 (L) 12/26/2019      Chemistry      Component Value Date/Time   NA 139 12/26/2019 0801   NA 141 01/20/2014 1604   K 3.9 12/26/2019 0801   K 4.2 01/20/2014 1604   CL 104 12/26/2019 0801   CO2 22 12/26/2019 0801   CO2 28 01/20/2014 1604   BUN 12 12/26/2019 0801   BUN 11 07/31/2019 0000   BUN 14.6 01/20/2014  1604   CREATININE 0.75 12/26/2019 0801   CREATININE 0.78 03/24/2019 1041   CREATININE 0.8 01/20/2014 1604      Component Value Date/Time   CALCIUM 10.2 12/26/2019 0801   CALCIUM 9.8 01/20/2014 1604   ALKPHOS 145 (H) 12/26/2019 0801   ALKPHOS 134 01/20/2014 1604   AST 24 12/26/2019 0801   AST 22 01/20/2014 1604   ALT 23 12/26/2019 0801   ALT 20 01/20/2014 1604   BILITOT 0.3 12/26/2019 0801   BILITOT <0.20 01/20/2014 1604

## 2019-12-26 NOTE — Assessment & Plan Note (Signed)
She tolerated last cycle of treatment well without major side effects except for mild fatigue, minimal neuropathy and anemia We will proceed with treatment as scheduled In the month, I will see her back with repeat blood work and CT imaging She had genetic testing that came back unremarkable

## 2019-12-26 NOTE — Assessment & Plan Note (Signed)
She will try to use ice during treatment to reduce risk of progression of neuropathy Her neuropathy is stable compared to prior visit We discussed the risk and benefits of referral to physical therapy and rehab and she is in agreement

## 2019-12-31 ENCOUNTER — Other Ambulatory Visit: Payer: Self-pay | Admitting: Family Medicine

## 2020-01-02 ENCOUNTER — Telehealth: Payer: Self-pay

## 2020-01-02 DIAGNOSIS — G4733 Obstructive sleep apnea (adult) (pediatric): Secondary | ICD-10-CM

## 2020-01-02 MED ORDER — AMBULATORY NON FORMULARY MEDICATION: 0 refills | Status: AC

## 2020-01-02 NOTE — Telephone Encounter (Signed)
Patricia Waters called and states she hasn't received the CPAP. I called [employer] and they state they have not received the order. I faxed the order again today.   [employer] Fax  [phone]

## 2020-01-19 ENCOUNTER — Other Ambulatory Visit: Payer: Self-pay

## 2020-01-19 ENCOUNTER — Ambulatory Visit (HOSPITAL_COMMUNITY)
Admission: RE | Admit: 2020-01-19 | Discharge: 2020-01-19 | Disposition: A | Discharge status: Home / Self Care | Payer: 59 | Source: Ambulatory Visit | Attending: Hematology and Oncology | Admitting: Hematology and Oncology

## 2020-01-19 ENCOUNTER — Other Ambulatory Visit: Payer: 59

## 2020-01-19 ENCOUNTER — Inpatient Hospital Stay: Payer: 59 | Attending: Gynecologic Oncology

## 2020-01-19 ENCOUNTER — Inpatient Hospital Stay: Payer: 59

## 2020-01-19 DIAGNOSIS — G620 Drug-induced polyneuropathy: Secondary | ICD-10-CM | POA: Diagnosis not present

## 2020-01-19 DIAGNOSIS — D61818 Other pancytopenia: Secondary | ICD-10-CM | POA: Insufficient documentation

## 2020-01-19 DIAGNOSIS — T451X5A Adverse effect of antineoplastic and immunosuppressive drugs, initial encounter: Secondary | ICD-10-CM | POA: Insufficient documentation

## 2020-01-19 DIAGNOSIS — C541 Malignant neoplasm of endometrium: Secondary | ICD-10-CM | POA: Insufficient documentation

## 2020-01-19 LAB — CBC WITH DIFFERENTIAL (CANCER CENTER ONLY)
Abs Immature Granulocytes: 0.02 10*3/uL (ref 0.00–0.07)
Basophils Absolute: 0 10*3/uL (ref 0.0–0.1)
Basophils Relative: 0 %
Eosinophils Absolute: 0 10*3/uL (ref 0.0–0.5)
Eosinophils Relative: 0 %
HCT: 25 % — ABNORMAL LOW (ref 36.0–46.0)
Hemoglobin: 8.3 g/dL — ABNORMAL LOW (ref 12.0–15.0)
Immature Granulocytes: 0 %
Lymphocytes Relative: 25 %
Lymphs Abs: 1.5 10*3/uL (ref 0.7–4.0)
MCH: 38.6 pg — ABNORMAL HIGH (ref 26.0–34.0)
MCHC: 33.2 g/dL (ref 30.0–36.0)
MCV: 116.3 fL — ABNORMAL HIGH (ref 80.0–100.0)
Monocytes Absolute: 0.5 10*3/uL (ref 0.1–1.0)
Monocytes Relative: 9 %
Neutro Abs: 3.9 10*3/uL (ref 1.7–7.7)
Neutrophils Relative %: 66 %
Platelet Count: 136 10*3/uL — ABNORMAL LOW (ref 150–400)
RBC: 2.15 MIL/uL — ABNORMAL LOW (ref 3.87–5.11)
RDW: 17.3 % — ABNORMAL HIGH (ref 11.5–15.5)
WBC Count: 6 10*3/uL (ref 4.0–10.5)
nRBC: 0.5 % — ABNORMAL HIGH (ref 0.0–0.2)

## 2020-01-19 LAB — CMP (CANCER CENTER ONLY)
ALT: 27 U/L (ref 0–44)
AST: 28 U/L (ref 15–41)
Albumin: 3.8 g/dL (ref 3.5–5.0)
Alkaline Phosphatase: 147 U/L — ABNORMAL HIGH (ref 38–126)
Anion gap: 10 (ref 5–15)
BUN: 8 mg/dL (ref 8–23)
CO2: 26 mmol/L (ref 22–32)
Calcium: 9.6 mg/dL (ref 8.9–10.3)
Chloride: 105 mmol/L (ref 98–111)
Creatinine: 0.8 mg/dL (ref 0.44–1.00)
GFR, Estimated: >60 mL/min (ref >60)
Glucose, Bld: 111 mg/dL — ABNORMAL HIGH (ref 70–99)
Potassium: 4 mmol/L (ref 3.5–5.1)
Sodium: 141 mmol/L (ref 135–145)
Total Bilirubin: 0.5 mg/dL (ref 0.3–1.2)
Total Protein: 7.4 g/dL (ref 6.5–8.1)

## 2020-01-19 MED ORDER — HEPARIN SOD (PORK) LOCK FLUSH 100 UNIT/ML IV SOLN
500.0000 [IU] | Freq: Once | INTRAVENOUS | Status: AC
  Administered 2020-01-19: 500 [IU] via INTRAVENOUS

## 2020-01-19 MED ORDER — HEPARIN SOD (PORK) LOCK FLUSH 100 UNIT/ML IV SOLN
INTRAVENOUS | Status: AC
  Filled 2020-01-19: qty 5

## 2020-01-19 MED ORDER — IOHEXOL 300 MG/ML  SOLN
100.0000 mL | Freq: Once | INTRAMUSCULAR | Status: AC | PRN
  Administered 2020-01-19: 100 mL via INTRAVENOUS

## 2020-01-19 MED ORDER — SODIUM CHLORIDE 0.9% FLUSH
10.0000 mL | Freq: Once | INTRAVENOUS | Status: AC
Start: 2020-01-19 — End: 2020-01-19
  Administered 2020-01-19: 10 mL
  Filled 2020-01-19: qty 10

## 2020-01-19 NOTE — Patient Instructions (Signed)

## 2020-01-20 ENCOUNTER — Inpatient Hospital Stay (HOSPITAL_BASED_OUTPATIENT_CLINIC_OR_DEPARTMENT_OTHER): Payer: 59 | Admitting: Hematology and Oncology

## 2020-01-20 ENCOUNTER — Encounter: Payer: Self-pay | Admitting: Hematology and Oncology

## 2020-01-20 ENCOUNTER — Other Ambulatory Visit: Payer: Self-pay

## 2020-01-20 DIAGNOSIS — T451X5A Adverse effect of antineoplastic and immunosuppressive drugs, initial encounter: Secondary | ICD-10-CM | POA: Diagnosis not present

## 2020-01-20 DIAGNOSIS — G620 Drug-induced polyneuropathy: Secondary | ICD-10-CM

## 2020-01-20 DIAGNOSIS — C541 Malignant neoplasm of endometrium: Secondary | ICD-10-CM

## 2020-01-20 DIAGNOSIS — D61818 Other pancytopenia: Secondary | ICD-10-CM

## 2020-01-20 NOTE — Assessment & Plan Note (Signed)
She has persistent pancytopenia due to recent treatment She does not need transfusion support I recommend observation only I plan to repeat blood work when I see her back in the future

## 2020-01-20 NOTE — Progress Notes (Signed)
[place] OFFICE PROGRESS NOTE  Patient Care Team: Hali Marry, MD as PCP - General (Family Medicine) Druscilla Brownie, MD (Dermatology) Kristeen Miss, MD (Neurosurgery) Guss Bunde, MD (Obstetrics and Gynecology) Awanda Mink Craige Cotta, RN as Oncology Nurse Navigator (Oncology)  ASSESSMENT & PLAN:  Endometrial cancer [facility]) I have reviewed CT imaging with the patient and her wife She has no evidence of disease We discussed the risk and benefits of keeping her port and she would like to keep her port for now I will see her in 6 months She will see GYN in 3 months  Pancytopenia, acquired [facility]) She has persistent pancytopenia due to recent treatment She does not need transfusion support I recommend observation only I plan to repeat blood work when I see her back in the future  Peripheral neuropathy due to chemotherapy [facility]) Her neuropathy is stable She is scheduled to see physical therapy and rehab   No orders of the defined types were placed in this encounter.   All questions were answered. The patient knows to call the clinic with any problems, questions or concerns. The total time spent in the appointment was 20 minutes encounter with patients including review of chart and various tests results, discussions about plan of care and coordination of care plan   [name], MD 01/20/2020 1:42 PM  INTERVAL HISTORY: Please see below for problem oriented charting. She returns to review test results She is doing well except for fatigue Unfortunately, her father recently passed away and she is grieving No worsening peripheral neuropathy since last time I saw her  SUMMARY OF ONCOLOGIC HISTORY: Oncology History Overview Note  MSI Stable Negative genetics   Endometrial cancer ([place])  07/28/2019 Pathology Results   Endometrium, biopsy - ENDOMETRIOID CARCINOMA - SEE COMMENT Microscopic Comment Based on the biopsy, the carcinoma appears FIGO grade 1.    08/01/2019 Imaging   CT abdomen and pelvis No evidence of abdominal or pelvic metastatic disease.   Small uterine fibroid.   Colonic diverticulosis, without radiographic evidence of diverticulitis.   Aortic Atherosclerosis (ICD10-I70.0).   08/14/2019 Surgery   Surgeon: Donaciano Eva  Operation: Robotic-assisted laparoscopic total hysterectomy with bilateral salpingoophorectomy, SLN biopsy     Operative Findings:  : 8cm uterus which appeared grossly normal, normal appearing tubes and ovaries, no suspicious lymph nodes. Brisk oozing from skin incisions.    09/03/2019 Cancer Staging   Staging form: Corpus Uteri - Carcinoma and Carcinosarcoma, AJCC 8th Edition - Pathologic stage from 09/03/2019: FIGO Stage IIIA (pT3a, pN0, cM0) - Signed by [name], MD on 09/03/2019   09/04/2019 Pathology Results   FINAL MICROSCOPIC DIAGNOSIS:   A. LYMPH NODE, SENTINEL RIGHT OBTURATOR PROXIMAL, BIOPSY:  -  No carcinoma identified in one lymph node (0/1)  -  See comment   B. LYMPH NODE, SENTINEL RIGHT OBTURATOR DISTAL, BIOPSY:  -  No carcinoma identified in one lymph node (0/1)  -  See comment   C. LYMPH NODE, SENTINEL LEFT EXTERNAL ILIAC, BIOPSY:  -  No carcinoma identified in one lymph node (0/1)  -  See comment   D. UTERUS AND BILATERAL ADNEXA, HYSTERECTOMY WITH SALPINGO-OOPHORECTOMY:   Uterus:  -  Endometrioid carcinoma, FIGO grade 1  -  Leiomyoma (1.8 cm; largest)  -  See oncology table and comment below   Cervix:  -  Cervix with squamous metaplasia  -  No carcinoma identified   Bilateral Ovaries:  -  Endometrioid carcinoma involving right ovary  -  No carcinoma involving left ovary  -  Endosalpingiosis   Bilateral Fallopian tubes:  -  No carcinoma identified   ONCOLOGY TABLE:   UTERUS, CARCINOMA OR CARCINOSARCOMA   Procedure: Total hysterectomy and bilateral salpingo-oophorectomy  Histologic type: Endometrioid carcinoma, NOS  Histologic Grade: FIGO grade 1  Myometrial  invasion:       Depth of invasion: 3 mm       Myometrial thickness: 20 mm  Uterine Serosa Involvement: Not identified  Cervical stromal involvement: Not identified  Extent of involvement of other organs: Right ovary  Lymphovascular invasion: Not identified  Regional Lymph Nodes:       Examined:      3 Sentinel                               0 non-sentinel                               3 total        Lymph nodes with metastasis: 0        Isolated tumor cells (<0.2 mm): 0        Micrometastasis:  (>0.2 mm and < 2.0 mm): 0        Macrometastasis: (>2.0 mm): 0        Extracapsular extension: N/A  Representative Tumor Block: D10  MMR / MSI testing: Pending  Pathologic Stage Classification (pTNM, AJCC 8th edition):  pT3a, pN0  Comments: Pancytokeratin performed on the lymph nodes is negative. The right ovary has a focus of carcinoma.     09/11/2019 Procedure   Placement of single lumen port a cath via right internal jugular vein. The catheter tip lies at the cavo-atrial junction. A power injectable port a cath was placed and is ready for immediate use.     09/12/2019 - 12/26/2019 Chemotherapy   The patient had carboplatin and taxol for chemotherapy treatment.     01/19/2020 Imaging   1. Interval total hysterectomy with bilateral salpingo oophorectomy. No findings to suggest recurrent or metastatic disease. 2. Tiny hiatal hernia. 3. Left colonic diverticulosis without diverticulitis. 4. Aortic Atherosclerosis (ICD10-I70.0).     REVIEW OF SYSTEMS:   Constitutional: Denies fevers, chills or abnormal weight loss Eyes: Denies blurriness of vision Ears, nose, mouth, throat, and face: Denies mucositis or sore throat Respiratory: Denies cough, dyspnea or wheezes Cardiovascular: Denies palpitation, chest discomfort or lower extremity swelling Gastrointestinal:  Denies nausea, heartburn or change in bowel habits Skin: Denies abnormal skin rashes Lymphatics: Denies new lymphadenopathy or  easy bruising Neurological:Denies numbness, tingling or new weaknesses Behavioral/Psych: Mood is stable, no new changes  All other systems were reviewed with the patient and are negative.  I have reviewed the past medical history, past surgical history, social history and family history with the patient and they are unchanged from previous note.  ALLERGIES:  has No Known Allergies.  MEDICATIONS:  Current Outpatient Medications  Medication Sig Dispense Refill  . AMBULATORY NON FORMULARY MEDICATION Medication Name: CPAP with humidifier with 11 cm water pressure.  Dx OSA.  See attached Sleep Study.  Please fax to [place]. 1 Units 0  . cholecalciferol (VITAMIN D) 1000 UNITS tablet Take 1,000 Units by mouth daily.      . DULoxetine (CYMBALTA) 60 MG capsule TAKE 1 CAPSULE BY MOUTH  DAILY 90 capsule 3  . fluticasone (FLONASE) 50 MCG/ACT nasal spray  Place 2 sprays into both nostrils daily as needed for allergies or rhinitis.    Marland Kitchen gabapentin (NEURONTIN) 300 MG capsule Take 2 capsules (600 mg total) by mouth daily. 180 capsule 2  . lidocaine-prilocaine (EMLA) cream Apply to affected area once 30 g 3  . Multiple Vitamins-Minerals (PRESERVISION AREDS) CAPS Take 1 capsule by mouth 1 day or 1 dose.    . ondansetron (ZOFRAN) 8 MG tablet Take 1 tablet (8 mg total) by mouth every 8 (eight) hours as needed. Start on the third day after chemotherapy. 30 tablet 1  . polyethylene glycol (MIRALAX / GLYCOLAX) 17 g packet Take 17 g by mouth daily as needed.    . prochlorperazine (COMPAZINE) 10 MG tablet Take 1 tablet (10 mg total) by mouth every 6 (six) hours as needed (Nausea or vomiting). 30 tablet 1  . rOPINIRole (REQUIP) 0.5 MG tablet TAKE 1 TABLET BY MOUTH AT  BEDTIME 90 tablet 3  . vitamin C (ASCORBIC ACID) 500 MG tablet Take 500 mg by mouth daily.       No current facility-administered medications for this visit.    PHYSICAL EXAMINATION: ECOG PERFORMANCE STATUS: 1 - Symptomatic but completely  ambulatory  Vitals:   01/20/20 1250  BP: (!) 129/57  Pulse: 93  Resp: 16  Temp: (!) 97.4 F (36.3 C)  SpO2: 100%   Filed Weights   01/20/20 1250  Weight: 268 lb 3.2 oz (121.7 kg)    GENERAL:alert, no distress and comfortable NEURO: alert & oriented x 3 with fluent speech, no focal motor/sensory deficits  LABORATORY DATA:  I have reviewed the data as listed    Component Value Date/Time   NA 141 01/19/2020 1013   NA 141 01/20/2014 1604   K 4.0 01/19/2020 1013   K 4.2 01/20/2014 1604   CL 105 01/19/2020 1013   CO2 26 01/19/2020 1013   CO2 28 01/20/2014 1604   GLUCOSE 111 (H) 01/19/2020 1013   GLUCOSE 99 01/20/2014 1604   BUN 8 01/19/2020 1013   BUN 11 07/31/2019 0000   BUN 14.6 01/20/2014 1604   CREATININE 0.80 01/19/2020 1013   CREATININE 0.78 03/24/2019 1041   CREATININE 0.8 01/20/2014 1604   CALCIUM 9.6 01/19/2020 1013   CALCIUM 9.8 01/20/2014 1604   PROT 7.4 01/19/2020 1013   PROT 7.3 01/20/2014 1604   ALBUMIN 3.8 01/19/2020 1013   ALBUMIN 4.0 01/20/2014 1604   AST 28 01/19/2020 1013   AST 22 01/20/2014 1604   ALT 27 01/19/2020 1013   ALT 20 01/20/2014 1604   ALKPHOS 147 (H) 01/19/2020 1013   ALKPHOS 134 01/20/2014 1604   BILITOT 0.5 01/19/2020 1013   BILITOT <0.20 01/20/2014 1604   GFRNONAA >60 01/19/2020 1013   GFRNONAA 81 03/24/2019 1041   GFRAA >60 10/24/2019 0737   GFRAA 94 03/24/2019 1041    No results found for: SPEP, UPEP  Lab Results  Component Value Date   WBC 6.0 01/19/2020   NEUTROABS 3.9 01/19/2020   HGB 8.3 (L) 01/19/2020   HCT 25.0 (L) 01/19/2020   MCV 116.3 (H) 01/19/2020   PLT 136 (L) 01/19/2020      Chemistry      Component Value Date/Time   NA 141 01/19/2020 1013   NA 141 01/20/2014 1604   K 4.0 01/19/2020 1013   K 4.2 01/20/2014 1604   CL 105 01/19/2020 1013   CO2 26 01/19/2020 1013   CO2 28 01/20/2014 1604   BUN 8 01/19/2020 1013   BUN  11 07/31/2019 0000   BUN 14.6 01/20/2014 1604   CREATININE 0.80 01/19/2020  1013   CREATININE 0.78 03/24/2019 1041   CREATININE 0.8 01/20/2014 1604      Component Value Date/Time   CALCIUM 9.6 01/19/2020 1013   CALCIUM 9.8 01/20/2014 1604   ALKPHOS 147 (H) 01/19/2020 1013   ALKPHOS 134 01/20/2014 1604   AST 28 01/19/2020 1013   AST 22 01/20/2014 1604   ALT 27 01/19/2020 1013   ALT 20 01/20/2014 1604   BILITOT 0.5 01/19/2020 1013   BILITOT <0.20 01/20/2014 1604       RADIOGRAPHIC STUDIES: I have reviewed her imaging study with the patient I have personally reviewed the radiological images as listed and agreed with the findings in the report. CT ABDOMEN PELVIS W CONTRAST  Result Date: 01/19/2020 CLINICAL DATA:  Endometrial cancer.  Restaging. EXAM: CT ABDOMEN AND PELVIS WITH CONTRAST TECHNIQUE: Multidetector CT imaging of the abdomen and pelvis was performed using the standard protocol following bolus administration of intravenous contrast. CONTRAST:  134m OMNIPAQUE IOHEXOL 300 MG/ML  SOLN COMPARISON:  08/01/2019 FINDINGS: Lower chest: Unremarkable. Hepatobiliary: No suspicious focal abnormality within the liver parenchyma. There is no evidence for gallstones, gallbladder wall thickening, or pericholecystic fluid. No intrahepatic or extrahepatic biliary dilation. Pancreas: No focal mass lesion. No dilatation of the main duct. No intraparenchymal cyst. No peripancreatic edema. Spleen: No splenomegaly. No focal mass lesion. Adrenals/Urinary Tract: No adrenal nodule or mass. Kidneys unremarkable. No evidence for hydroureter. The urinary bladder appears normal for the degree of distention. Stomach/Bowel: Tiny hiatal hernia. Stomach otherwise unremarkable. Duodenum is normally positioned as is the ligament of Treitz. No small bowel wall thickening. No small bowel dilatation. The terminal ileum is normal. The appendix is normal. No gross colonic mass. No colonic wall thickening. Diverticular changes are noted in the left colon without evidence of diverticulitis.  Vascular/Lymphatic: There is abdominal aortic atherosclerosis without aneurysm. There is no gastrohepatic or hepatoduodenal ligament lymphadenopathy. No retroperitoneal or mesenteric lymphadenopathy. No pelvic sidewall lymphadenopathy. Reproductive: Interval hysterectomy There is no adnexal mass. Other: No intraperitoneal free fluid. No omental nodularity or abnormal soft tissue tiny nodules in the gastrocolic ligament (images 25 and 26 of series 2 are stable in the interval. No evidence for peritoneal nodularity or peritoneal thickening. No suspicious mesenteric nodule or mass. Musculoskeletal: No worrisome lytic or sclerotic osseous abnormality. Degenerative changes noted lumbar spine. No worrisome lytic or sclerotic osseous abnormality. IMPRESSION: 1. Interval total hysterectomy with bilateral salpingo oophorectomy. No findings to suggest recurrent or metastatic disease. 2. Tiny hiatal hernia. 3. Left colonic diverticulosis without diverticulitis. 4. Aortic Atherosclerosis (ICD10-I70.0). Electronically Signed   By: EMisty StanleyM.D.   On: 01/19/2020 14:09

## 2020-01-20 NOTE — Assessment & Plan Note (Signed)
I have reviewed CT imaging with the patient and her wife She has no evidence of disease We discussed the risk and benefits of keeping her port and she would like to keep her port for now I will see her in 6 months She will see GYN in 3 months

## 2020-01-20 NOTE — Assessment & Plan Note (Signed)
Her neuropathy is stable She is scheduled to see physical therapy and rehab

## 2020-01-21 ENCOUNTER — Telehealth: Payer: Self-pay | Admitting: Oncology

## 2020-01-21 NOTE — Telephone Encounter (Signed)
Left a message with appointment to see Dr. [name] on 04/12/20 at 2:00.  Requested a return call to confirm.

## 2020-01-28 ENCOUNTER — Other Ambulatory Visit: Payer: Self-pay

## 2020-01-28 ENCOUNTER — Ambulatory Visit: Payer: 59 | Attending: Hematology and Oncology

## 2020-01-28 DIAGNOSIS — Z483 Aftercare following surgery for neoplasm: Secondary | ICD-10-CM | POA: Diagnosis present

## 2020-01-28 DIAGNOSIS — I972 Postmastectomy lymphedema syndrome: Secondary | ICD-10-CM | POA: Diagnosis present

## 2020-01-28 DIAGNOSIS — C541 Malignant neoplasm of endometrium: Secondary | ICD-10-CM | POA: Diagnosis present

## 2020-01-28 DIAGNOSIS — I890 Lymphedema, not elsewhere classified: Secondary | ICD-10-CM | POA: Insufficient documentation

## 2020-01-28 NOTE — Patient Instructions (Signed)
Access Code: [email or web address] URL: [email or web address] Date: 01/28/2020 Prepared by: Alvira Monday  Exercises Seated Wrist Flexion Stretch - 2 x daily - 7 x weekly - 3 reps - 20 hold Standing Wrist Extension Stretch - 2 x daily - 7 x weekly - 3 sets - 3 reps - 20 hold Seated Wrist Flexion with Dumbbell - 1 x daily - 7 x weekly - 1 sets - 10 reps

## 2020-01-28 NOTE — Therapy (Signed)
[place], [place], [address] Phone: [phone]   Fax:  [phone]  Physical Therapy Evaluation  Patient Details  Name: Patricia Waters MRN: [id number] Date of Birth: 13-Jan-1956 Referring Provider (PT): Dr. [name]   Encounter Date: 01/28/2020   PT End of Session - 01/28/20 1220    Visit Number 1    Number of Visits 4    Date for PT Re-Evaluation 03/03/20    Activity Tolerance Patient tolerated treatment well    Behavior During Therapy [facility] for tasks assessed/performed           Past Medical History:  Diagnosis Date  . Arthritis   . Breast cancer ([place])    at age 73  . Family history of breast cancer   . Family history of colon cancer   . Family history of prostate cancer in father   . Family history of uterine cancer   . History of radiation therapy   . Lymph edema    right arm   . PONV (postoperative nausea and vomiting)   . Pre-diabetes   . Sleep apnea    pt has nose pillows for cpap per pt - will bring DOS   . Vertigo     Past Surgical History:  Procedure Laterality Date  . BACK SURGERY    . BREAST LUMPECTOMY  1994   right  . CERVICAL FUSION  1995   C5-C7  . IR IMAGING GUIDED PORT INSERTION  09/11/2019  . MASTECTOMY  2005   Bilat   . ROBOTIC ASSISTED TOTAL HYSTERECTOMY WITH BILATERAL SALPINGO OOPHERECTOMY N/A 08/14/2019   Procedure: XI ROBOTIC ASSISTED TOTAL HYSTERECTOMY WITH BILATERAL SALPINGO OOPHORECTOMY;  Surgeon: [name], MD;  Location: [facility];  Service: Gynecology;  Laterality: N/A;  . SENTINEL NODE BIOPSY N/A 08/14/2019   Procedure: SENTINEL NODE BIOPSY;  Surgeon: [name], MD;  Location: [facility];  Service: Gynecology;  Laterality: N/A;    There were no vitals filed for this visit.    Subjective Assessment - 01/28/20 0905    Subjective Not having problems with neuropathy but I am here now for lymphedma with increased swelling in right UE.  Had Breast lumpectomy  and node dissection  on right in 1994, and in 2005 had a bilateral mastectomy secondary to left breast CA.  Swelling  in right arm has been present since surgery but was aggravated by golfers elbow over the last few months.  Has pain in elbow from golfers elbow and has been treated by an orthopedic (Dr Donnajean Lopes).  She reports being diagnosed with Endometrial CA and having had a total hysterectomy with bilateral salpinoophorectomy on 08/14/19 with right and left SLNB and chemo with taxol and Carboplatin.    Pertinent History Not having problems with neuropathy but I am here now for lymphedma with increased swelling in right UE.  Had Breast lumpectomy and node dissection  on right in 1994, and in 2005 had a bilateral mastectomy secondary to left breast CA.  Swelling  in right arm has been present since surgery but was aggravated by golfers elbow over the last few months.  Has pain in elbow from golfers elbow.  She was diagnosed with Endometrial Cancer and had a total hysterectomy with bilateral salpinoophorectomy on 08/14/19 with right and left SLNB and chemo with taxol and Carboplatin    Currently in Pain? Yes    Pain Score 5     Pain Location Elbow    Pain Orientation Right  Pain Descriptors / Indicators Sharp;Tender    Pain Type Chronic pain    Pain Onset More than a month ago    Pain Frequency Constant              [facility] PT Assessment - 01/28/20 0001      Assessment   Medical Diagnosis right UE Lymphedema/ CIPN    Referring Provider (PT) Dr. [name]    Onset Date/Surgical Date --   1994   Hand Dominance Right    Prior Therapy yes      Precautions   Precautions --   lymphedema precautions     Restrictions   Weight Bearing Restrictions No    Other Position/Activity Restrictions no      Balance Screen   Has the patient fallen in the past 6 months No    Has the patient had a decrease in activity level because of a fear of falling?  Yes    Is the patient reluctant to leave their  home because of a fear of falling?  No      Home Environment   Living Environment Private residence    Living Arrangements Spouse/significant other    Available Help at Discharge Family    Type of [place]      Prior Function   Level of [place] Retired      Associate Professor   Overall Cognitive Status Within Functional Limits for tasks assessed      Observation/Other Assessments   Skin Integrity WNL      Sensation   Light Touch Appears Intact      Posture/Postural Control   Posture/Postural Control Postural limitations    Postural Limitations Rounded Shoulders;Forward head      AROM   Overall AROM Comments WNL per pt report      Palpation   Palpation comment tender at right medial epicondyle      Special Tests   Other special tests positive medial epicondyle test             LYMPHEDEMA/ONCOLOGY QUESTIONNAIRE - 01/28/20 0001      Surgeries   Mastectomy Date 01/31/03    Lumpectomy Date 07/12/92      Treatment   Active Chemotherapy Treatment No    Past Chemotherapy Treatment Yes    Active Radiation Treatment No    Past Radiation Treatment Yes    Body Site right breast    Current Hormone Treatment No    Past Hormone Therapy No      What other symptoms do you have   Are you Having Heaviness or Tightness Yes    Are you having Pain Yes    Is it Hard or Difficult finding clothes that fit No    Do you have infections No      Right Upper Extremity Lymphedema   At Axilla  45.2 cm    15 cm Proximal to Olecranon Process 43 cm    10 cm Proximal to Olecranon Process 39.7 cm    Olecranon Process 31.2 cm    15 cm Proximal to Ulnar Styloid Process 31.7 cm    10 cm Proximal to Ulnar Styloid Process 28.5 cm    Just Proximal to Ulnar Styloid Process 21.8 cm    Across Hand at [employer] 21.8 cm    At [place] of 2nd Digit 7.5 cm    At [facility] of Thumb 7.1 cm      Left Upper Extremity Lymphedema  At Axilla  43.1 cm    15 cm Proximal to  Olecranon Process 41.3 cm    10 cm Proximal to Olecranon Process 38.5 cm    Olecranon Process 30.7 cm    15 cm Proximal to Ulnar Styloid Process 30.2 cm    10 cm Proximal to Ulnar Styloid Process 26.7 cm    Just Proximal to Ulnar Styloid Process 21 cm    Across Hand at [employer] 20.5 cm    At [place] of 2nd Digit 7.3 cm    At [facility] of Thumb 6.5 cm                   Objective measurements completed on examination: See above findings.               PT Education - 01/28/20 1225    Education Details Discussed bandaging and CDP however, pt has been through this before and was not interested in being treated again.  She wants only to be fit for a new sleeve.  Appt was made on Jan. 7 with Melissa.She was also given pictures for wrist flexor stretching and strengthening at her request as she had lost pictures given to her by orthopedic.  Advised to discontinue exercises if they cause increased pain.              PT Long Term Goals - 01/28/20 1228      PT LONG TERM GOAL #1   Title Pt will be measured for new right UE compression garments    Time 2    Period Weeks    Status New    Target Date 02/13/20      PT LONG TERM GOAL #2   Title Pt will be independent with donning/doffing compression garments and they will fit appropriately.    Time 4    Period Weeks    Status New                  Plan - 01/28/20 1221    Clinical Impression Statement Pt. presents today for increased swelling in the right arm for which she has been through bandaging, exercise, MLD.  She had a sleeve, and also has an older pump which she does not use.  She is not interested in bandaging again and only wants to be fit for a compression sleeve.  She is scheduled to be measured by Melissa on 02/06/19. She plans on seeing an orthopedic doctor for her elbow pain.  she requested copies of stretching/strengthening for golfers elbow which she was given by orthopedic but lost pictures.  These  were given to her today with advice to discontinue if she has increased pain.           Patient will benefit from skilled therapeutic intervention in order to improve the following deficits and impairments:     Visit Diagnosis: Endometrial cancer [facility])  Secondary lymphedema  Postmastectomy lymphedema syndrome  Aftercare following surgery for neoplasm     Problem List Patient Active Problem List   Diagnosis Date Noted  . Pancytopenia, acquired ([facility]) 12/26/2019  . Anemia due to antineoplastic chemotherapy 12/05/2019  . Peripheral neuropathy due to chemotherapy ([facility]) 10/24/2019  . Mild headache 10/24/2019  . Family history of colon cancer   . Family history of breast cancer   . Family history of uterine cancer   . Family history of prostate cancer in father   . Endometrial cancer ([facility]) 08/14/2019  . Caregiver stress 03/24/2019  .  Medial epicondylitis, right 10/11/2018  . Abnormal weight gain 09/17/2018  . Hx of bilateral mastectomy 11/25/2015  . History of breast cancer in female 07/21/2014  . Family history of breast cancer in first degree relative 07/21/2014  . 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 Lymphedema 10/23/2013  . BMI 40.0-44.9, adult ([place]) 09/25/2013  . Periodic limb movement 06/13/2013  . Plantar fasciitis, bilateral 06/04/2013  . RLS (restless legs syndrome) 03/19/2013  . Central sleep apnea 03/19/2013  . Sleep apnea, obstructive 03/19/2013  . IFG (impaired fasting glucose) 12/14/2011  . 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 POSTMENOPAUSAL STATUS 01/10/2010  . SPINAL STENOSIS, LUMBAR 07/06/2009  . OTH NONSPC ABN FINDNG RAD&OTH EXM BODY STRUCTURE 03/30/2009  . Low back pain 04/23/2007    Claris Pong 01/28/2020, 12:30 PM  [place], [place], [address] Phone: [phone]   Fax:  [phone]  Name: Patricia Waters MRN: [id number] Date of  Birth: 1955-09-01 Cheral Almas, PT 01/28/20 12:33 PM

## 2020-02-04 ENCOUNTER — Other Ambulatory Visit: Payer: Self-pay | Admitting: Family Medicine

## 2020-02-18 ENCOUNTER — Telehealth: Payer: Self-pay

## 2020-02-18 NOTE — Telephone Encounter (Signed)
Called pt because I was informed she did not purchase sleeve/gauntlet from [facility] because it was too expensive ($500.)  I suggested she have her wife measure her and to check the [employer] site for appropriate sleeves/gauntlet.  Advised her to call me with questions or to come in if she needs me to measure for her.  Pt verbalized understanding. Patricia Waters PT

## 2020-03-03 ENCOUNTER — Telehealth: Payer: Self-pay | Admitting: *Deleted

## 2020-03-03 NOTE — Telephone Encounter (Signed)
Please call patient and have her schedule an appointment for her CPAP she has to have this done between now and 05/27/2020.   Thank you.

## 2020-03-04 NOTE — Telephone Encounter (Signed)
Pt said she would call us back tomorrow to schedule

## 2020-03-06 ENCOUNTER — Encounter (INDEPENDENT_AMBULATORY_CARE_PROVIDER_SITE_OTHER): Payer: Self-pay

## 2020-03-16 ENCOUNTER — Inpatient Hospital Stay: Payer: 59 | Attending: Gynecologic Oncology

## 2020-03-16 ENCOUNTER — Other Ambulatory Visit: Payer: Self-pay

## 2020-03-16 DIAGNOSIS — Z452 Encounter for adjustment and management of vascular access device: Secondary | ICD-10-CM | POA: Diagnosis present

## 2020-03-16 DIAGNOSIS — C541 Malignant neoplasm of endometrium: Secondary | ICD-10-CM | POA: Diagnosis present

## 2020-03-16 MED ORDER — HEPARIN SOD (PORK) LOCK FLUSH 100 UNIT/ML IV SOLN
500.0000 [IU] | Freq: Once | INTRAVENOUS | Status: AC
Start: 2020-03-16 — End: 2020-03-16
  Administered 2020-03-16: 500 [IU]
  Filled 2020-03-16: qty 5

## 2020-03-16 MED ORDER — SODIUM CHLORIDE 0.9% FLUSH
10.0000 mL | Freq: Once | INTRAVENOUS | Status: AC
  Administered 2020-03-16: 10 mL
  Filled 2020-03-16: qty 10

## 2020-03-16 NOTE — Patient Instructions (Signed)

## 2020-03-24 ENCOUNTER — Ambulatory Visit (INDEPENDENT_AMBULATORY_CARE_PROVIDER_SITE_OTHER): Payer: 59 | Admitting: Family Medicine

## 2020-03-24 ENCOUNTER — Encounter (INDEPENDENT_AMBULATORY_CARE_PROVIDER_SITE_OTHER): Payer: Self-pay | Admitting: Family Medicine

## 2020-03-24 ENCOUNTER — Other Ambulatory Visit: Payer: Self-pay

## 2020-03-24 VITALS — BP 119/80 | HR 68 | Temp 98.4°F | Ht 64.0 in | Wt 258.0 lb

## 2020-03-24 DIAGNOSIS — Z0289 Encounter for other administrative examinations: Secondary | ICD-10-CM

## 2020-03-24 DIAGNOSIS — R5383 Other fatigue: Secondary | ICD-10-CM | POA: Diagnosis not present

## 2020-03-24 DIAGNOSIS — E559 Vitamin D deficiency, unspecified: Secondary | ICD-10-CM

## 2020-03-24 DIAGNOSIS — E782 Mixed hyperlipidemia: Secondary | ICD-10-CM

## 2020-03-24 DIAGNOSIS — Z9189 Other specified personal risk factors, not elsewhere classified: Secondary | ICD-10-CM

## 2020-03-24 DIAGNOSIS — R0602 Shortness of breath: Secondary | ICD-10-CM

## 2020-03-24 DIAGNOSIS — Z6841 Body Mass Index (BMI) 40.0 and over, adult: Secondary | ICD-10-CM

## 2020-03-24 DIAGNOSIS — R739 Hyperglycemia, unspecified: Secondary | ICD-10-CM | POA: Diagnosis not present

## 2020-03-24 DIAGNOSIS — Z1331 Encounter for screening for depression: Secondary | ICD-10-CM

## 2020-03-25 LAB — T4: T4, Total: 6.4 ug/dL (ref 4.5–12.0)

## 2020-03-25 LAB — LIPID PANEL WITH LDL/HDL RATIO
Cholesterol, Total: 214 mg/dL — ABNORMAL HIGH (ref 100–199)
HDL: 55 mg/dL (ref >39)
LDL Chol Calc (NIH): 127 mg/dL — ABNORMAL HIGH (ref 0–99)
LDL/HDL Ratio: 2.3 ratio (ref 0.0–3.2)
Triglycerides: 180 mg/dL — ABNORMAL HIGH (ref 0–149)
VLDL Cholesterol Cal: 32 mg/dL (ref 5–40)

## 2020-03-25 LAB — T3: T3, Total: 142 ng/dL (ref 71–180)

## 2020-03-25 LAB — HEMOGLOBIN A1C
Est. average glucose Bld gHb Est-mCnc: 94 mg/dL
Hgb A1c MFr Bld: 4.9 % (ref 4.8–5.6)

## 2020-03-25 LAB — INSULIN, RANDOM: INSULIN: 10.2 u[IU]/mL (ref 2.6–24.9)

## 2020-03-25 LAB — TSH: TSH: 3.21 u[IU]/mL (ref 0.450–4.500)

## 2020-03-25 LAB — VITAMIN D 25 HYDROXY (VIT D DEFICIENCY, FRACTURES): Vit D, 25-Hydroxy: 40.3 ng/mL (ref 30.0–100.0)

## 2020-03-29 NOTE — Progress Notes (Signed)
Chief Complaint:   OBESITY Patricia Waters (MR# [id number]) is a 65 y.o. female who presents for evaluation and treatment of obesity and related comorbidities. Current BMI is Body mass index is 44.29 kg/m. Patricia Waters has been struggling with her weight for many years and has been unsuccessful in either losing weight, maintaining weight loss, or reaching her healthy weight goal.  Patricia Waters is currently in the action stage of change and ready to dedicate time achieving and maintaining a healthier weight. Patricia Waters is interested in becoming our patient and working on intensive lifestyle modifications including (but not limited to) diet and exercise for weight loss.  Patricia Waters's habits were reviewed today and are as follows: Her family eats meals together, she thinks her family will eat healthier with her, her desired weight loss is 78-98 lbs, she started gaining weight in the 1990's, her heaviest weight ever was 266 pounds, she has significant food cravings issues, she snacks frequently in the evenings, she skips meals frequently, she is frequently drinking liquids with calories, she frequently makes poor food choices, she has problems with excessive hunger, she frequently eats larger portions than normal and she struggles with emotional eating.  Depression Screen Patricia Waters's Food and Mood (modified PHQ-9) score was 12.  Depression screen [facility] 2/9 03/24/2020  Decreased Interest 3  Down, Depressed, Hopeless 1  PHQ - 2 Score 4  Altered sleeping 3  Tired, decreased energy 3  Change in appetite 1  Feeling bad or failure about yourself  1  Trouble concentrating 0  Moving slowly or fidgety/restless 0  Suicidal thoughts 0  PHQ-9 Score 12  Difficult doing work/chores Not difficult at all  Some recent data might be hidden   Subjective:   1. Other fatigue Patricia Waters admits to daytime somnolence and denies waking up still tired. Patent has a history of symptoms of daytime fatigue. Patricia Waters generally gets 8 or  9 hours of sleep per night, and states that she has generally restful sleep. Snoring is not present. Apneic episodes are present. Epworth Sleepiness Score is 1.  2. Shortness of breath on exertion Patricia Waters notes increasing shortness of breath with exercising and seems to be worsening over time with weight gain. She notes getting out of breath sooner with activity than she used to. This has not gotten worse recently. Patricia Waters denies shortness of breath at rest or orthopnea.  3. Hyperglycemia Patricia Waters has a history of elevated glucose and A1c. She is working on diet and weight loss. She is not on metformin.  4. Vitamin D deficiency Patricia Waters is on OTC Vit D 1,000 IU daily, and she notes fatigue.  5. Mixed hyperlipidemia Patricia Waters has a history of elevated triglycerides. She is on krill oil OTC, and she is working on diet and weight loss.  6. At risk for diabetes mellitus Patricia Waters is at higher than average risk for developing diabetes due to obesity.   Assessment/Plan:   1. Other fatigue Patricia Waters does feel that her weight is causing her energy to be lower than it should be. Fatigue may be related to obesity, depression or many other causes. Labs will be ordered, and in the meanwhile, Patricia Waters will focus on self care including making healthy food choices, increasing physical activity and focusing on stress reduction.  - EKG 12-Lead - T3 - T4 - TSH  2. Shortness of breath on exertion Patricia Waters does feel that she gets out of breath more easily that she used to when she exercises. Patricia Waters's shortness of breath appears to  be obesity related and exercise induced. She has agreed to work on weight loss and gradually increase exercise to treat her exercise induced shortness of breath. Will continue to monitor closely.  3. Hyperglycemia Fasting labs will be obtained today, and results with be discussed with Shandel in 2 weeks at her follow up visit. In the meanwhile Patricia Waters will start her Category 3 plan and  will work on weight loss efforts.  - Hemoglobin A1c - Insulin, random  4. Vitamin D deficiency Low Vitamin D level contributes to fatigue and are associated with obesity, breast, and colon cancer. We will check labs today. Patricia Waters will follow-up for routine testing of Vitamin D, at least 2-3 times per year to avoid over-replacement.  - VITAMIN D 25 Hydroxy (Vit-D Deficiency, Fractures)  5. Mixed hyperlipidemia Cardiovascular risk and specific lipid/LDL goals reviewed. We discussed several lifestyle modifications today. Patricia Waters will start her Category 3 plan, and will continue to work on diet, exercise and weight loss efforts. We will check labs today. Orders and follow up as documented in patient record.   - Lipid Panel With LDL/HDL Ratio  6. Screening for depression Patricia Waters had a positive depression screening. Depression is commonly associated with obesity and often results in emotional eating behaviors. We will monitor this closely and work on CBT to help improve the non-hunger eating patterns. Referral to Psychology may be required if no improvement is seen as she continues in our clinic.  7. At risk for diabetes mellitus Patricia Waters was given approximately 30 minutes of diabetes education and counseling today. We discussed intensive lifestyle modifications today with an emphasis on weight loss as well as increasing exercise and decreasing simple carbohydrates in her diet. We also reviewed medication options with an emphasis on risk versus benefit of those discussed.   Repetitive spaced learning was employed today to elicit superior memory formation and behavioral change.  8. Class 3 severe obesity with serious comorbidity and body mass index (BMI) of 40.0 to 44.9 in adult, unspecified obesity type (HCC) Patricia Waters is currently in the action stage of change and her goal is to continue with weight loss efforts. I recommend Patricia Waters begin the structured treatment plan as follows:  She has agreed  to the Category 3 Plan.  Exercise goals: No exercise has been prescribed for now, while we concentrate on nutritional changes.  Behavioral modification strategies: increasing lean protein intake and meal planning and cooking strategies.  She was informed of the importance of frequent follow-up visits to maximize her success with intensive lifestyle modifications for her multiple health conditions. She was informed we would discuss her lab results at her next visit unless there is a critical issue that needs to be addressed sooner. Yentl agreed to keep her next visit at the agreed upon time to discuss these results.  Objective:   Blood pressure 119/80, pulse 68, temperature 98.4 F (36.9 C), height 5\' 4"  (1.626 m), weight 258 lb (117 kg), SpO2 96 %. Body mass index is 44.29 kg/m.  EKG: Normal sinus rhythm, rate 71 BPM.  Indirect Calorimeter completed today shows a VO2 of 262 and a REE of 1826.  Her calculated basal metabolic rate is 4081 thus her basal metabolic rate is better than expected.  General: Cooperative, alert, well developed, in no acute distress. HEENT: Conjunctivae and lids unremarkable. Cardiovascular: Regular rhythm.  Lungs: Normal work of breathing. Neurologic: No focal deficits.   Lab Results  Component Value Date   CREATININE 0.80 01/19/2020   BUN 8 01/19/2020  NA 141 01/19/2020   K 4.0 01/19/2020   CL 105 01/19/2020   CO2 26 01/19/2020   Lab Results  Component Value Date   ALT 27 01/19/2020   AST 28 01/19/2020   ALKPHOS 147 (H) 01/19/2020   BILITOT 0.5 01/19/2020   Lab Results  Component Value Date   HGBA1C 4.9 03/24/2020   HGBA1C 5.7 (A) 12/22/2019   HGBA1C 5.4 03/24/2019   HGBA1C 5.2 09/17/2018   HGBA1C 5.2 01/02/2018   Lab Results  Component Value Date   INSULIN 10.2 03/24/2020   Lab Results  Component Value Date   TSH 3.210 03/24/2020   Lab Results  Component Value Date   CHOL 214 (H) 03/24/2020   HDL 55 03/24/2020   LDLCALC 127 (H)  03/24/2020   TRIG 180 (H) 03/24/2020   CHOLHDL 3.3 03/24/2019   Lab Results  Component Value Date   WBC 6.0 01/19/2020   HGB 8.3 (L) 01/19/2020   HCT 25.0 (L) 01/19/2020   MCV 116.3 (H) 01/19/2020   PLT 136 (L) 01/19/2020   Lab Results  Component Value Date   IRON 85 11/25/2015   TIBC 345 11/25/2015   FERRITIN 89 11/25/2015   Attestation Statements:   Reviewed by clinician on day of visit: allergies, medications, problem list, medical history, surgical history, family history, social history, and previous encounter notes.   I, Trixie Dredge, am acting as transcriptionist for [name], MD.  I have reviewed the above documentation for accuracy and completeness, and I agree with the above. - [name], MD

## 2020-04-07 ENCOUNTER — Other Ambulatory Visit: Payer: Self-pay

## 2020-04-07 ENCOUNTER — Encounter (INDEPENDENT_AMBULATORY_CARE_PROVIDER_SITE_OTHER): Payer: Self-pay | Admitting: Family Medicine

## 2020-04-07 ENCOUNTER — Ambulatory Visit (INDEPENDENT_AMBULATORY_CARE_PROVIDER_SITE_OTHER): Payer: 59 | Admitting: Family Medicine

## 2020-04-07 VITALS — BP 132/81 | HR 88 | Temp 98.5°F | Ht 64.0 in | Wt 254.0 lb

## 2020-04-07 DIAGNOSIS — E559 Vitamin D deficiency, unspecified: Secondary | ICD-10-CM

## 2020-04-07 DIAGNOSIS — Z6841 Body Mass Index (BMI) 40.0 and over, adult: Secondary | ICD-10-CM

## 2020-04-07 DIAGNOSIS — Z9189 Other specified personal risk factors, not elsewhere classified: Secondary | ICD-10-CM | POA: Diagnosis not present

## 2020-04-07 DIAGNOSIS — E8881 Metabolic syndrome: Secondary | ICD-10-CM | POA: Diagnosis not present

## 2020-04-08 ENCOUNTER — Encounter (INDEPENDENT_AMBULATORY_CARE_PROVIDER_SITE_OTHER): Payer: Self-pay | Admitting: Family Medicine

## 2020-04-09 ENCOUNTER — Encounter: Payer: Self-pay | Admitting: Gynecologic Oncology

## 2020-04-09 ENCOUNTER — Encounter (INDEPENDENT_AMBULATORY_CARE_PROVIDER_SITE_OTHER): Payer: Self-pay | Admitting: Family Medicine

## 2020-04-09 NOTE — Progress Notes (Signed)
Gynecologic Oncology Follow-up Note  Chief Complaint:  Chief Complaint  Patient presents with  . Endometrial cancer [facility])    Assessment/Plan:  [name]  is a 65 y.o.  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 s/p adjuvant chemotherapy with 6 cycles carb/taxol completed 12/26/19. Complete clinical response.    HPI: Patricia Waters is a 65 year old P0 who was seen in consultation at the request of Dr Gala Romney for evaluation of grade 1 endometrial cancer.  The patient is a regular patient of Dr Roseanne Kaufman for gynecological care she experienced an episode of postmenopausal bleeding on July 06, 2019 and promptly notified her gynecologist who made an appointment to see the patient.  An endometrial biopsy was performed on July 28, 2019 which revealed FIGO grade 1 endometrioid adenocarcinoma.  A CT abdomen and pelvis was subsequently performed on August 01, 2019 and showed no evidence of abdominal pelvic metastatic disease.  A small fibroid was seen in the anterior uterine corpus measuring 1.7 cm but the uterus was otherwise unremarkable.  Interval Hx:  On 08/14/19 she underwent robotic assisted TLH, BSO, SLN biopsy. Intraoperative findings were significant for no gross extrauterine disease. Surgery was uncomplicated.  Final pathology revealed an endometrioid endometrial adenocarcinoma, FIGO grade 1. There were 3 of 49m (inner half invasion) myometrial invasion, no LVSI, no cervical or lymph node involvement. There was a focus of grade 1 endometrioid adenocarcinoma in the right ovary and she was staged as FIGO stage III(MSI stable). Adjuvant therapy (6 cycles carboplatin and paclitaxel) was recommended.   Genetic testing was negative for identifiable deleterious mutations.   She went on to complete 6 cycles of carboplatin and paclitaxel between 09/12/19 and 12/26/19.  Post treatment imaging on 01/19/20 with CT abd/pelvis showed no measurable disease  (complete clinical response).  She has done well post treatment with minimal toxicities.   Current Meds:  Outpatient Encounter Medications as of 04/12/2020  Medication Sig  . AMBULATORY NON FORMULARY MEDICATION Medication Name: CPAP with humidifier with 11 cm water pressure.  Dx OSA.  See attached Sleep Study.  [facility]  .Marland KitchenCALCIUM-VITAMIN D PO Take 1 tablet by mouth daily.  . DULoxetine (CYMBALTA) 60 MG capsule TAKE 1 CAPSULE BY MOUTH  DAILY  . fluticasone (FLONASE) 50 MCG/ACT nasal spray Place 2 sprays into both nostrils daily as needed for allergies or rhinitis.  .Marland Kitchengabapentin (NEURONTIN) 300 MG capsule TAKE 2 CAPSULES BY MOUTH  DAILY  . Krill Oil 500 MG CAPS Take 1 capsule by mouth daily.  .Marland Kitchenlidocaine-prilocaine (EMLA) cream Apply 1 application topically as needed.  . Multiple Vitamins-Minerals (CENTRUM SILVER 50+WOMEN) TABS Take 1 capsule by mouth daily.  . Multiple Vitamins-Minerals (PRESERVISION AREDS) CAPS Take 1 capsule by mouth daily.  . polyethylene glycol (MIRALAX / GLYCOLAX) 17 g packet Take 17 g by mouth daily as needed.  .Marland KitchenrOPINIRole (REQUIP) 0.5 MG tablet TAKE 1 TABLET BY MOUTH AT  BEDTIME  . vitamin C (ASCORBIC ACID) 500 MG tablet Take 500 mg by mouth daily.  .Marland KitchenVITAMIN D PO Take 50,000 Units by mouth once a week.   No facility-administered encounter medications on file as of 04/12/2020.    Allergy: No Known Allergies  Social Hx:   Social History   Socioeconomic History  . Marital status: Married    Spouse name: JDamien Fusi . Number of children: 0  . Years of education: Not on file  . Highest education  level: Not on file  Occupational History  . 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[place]  Tobacco Use  . Smoking status: Former Smoker    Types: Cigarettes    Quit date: 10/31/2006    Years since quitting: 13.4  . Smokeless tobacco: Never Used  Vaping Use  . Vaping Use: Never used  Substance  and Sexual Activity  . Alcohol use: Not Currently  . Drug use: No  . Sexual activity: Not Currently    Partners: Female    Birth control/protection: None  Other Topics Concern  . Not on file  Social History Narrative   Lives with her partner, [name]. Former Therapist, nutritional.    Social Determinants of Health   Financial Resource Strain: Not on file  Food Insecurity: Not on file  Transportation Needs: Not on file  Physical Activity: Not on file  Stress: Not on file  Social Connections: Not on file  Intimate Partner Violence: Not on file    Past Surgical Hx:  Past Surgical History:  Procedure Laterality Date  . BACK SURGERY    . 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    . BILATERAL TOTAL MASTECTOMY WITH AXILLARY LYMPH NODE DISSECTION Bilateral 2005  . BREAST LUMPECTOMY  1994   right  . CERVICAL FUSION  1995   C5-C7  . IR IMAGING GUIDED PORT INSERTION  09/11/2019  . MASTECTOMY  2005   Bilat   . ROBOTIC ASSISTED TOTAL HYSTERECTOMY WITH BILATERAL SALPINGO OOPHERECTOMY N/A 08/14/2019   Procedure: XI ROBOTIC ASSISTED TOTAL HYSTERECTOMY WITH BILATERAL SALPINGO OOPHORECTOMY;  Surgeon: [name], MD;  Location: [facility];  Service: Gynecology;  Laterality: N/A;  . SENTINEL NODE BIOPSY N/A 08/14/2019   Procedure: SENTINEL NODE BIOPSY;  Surgeon: [name], MD;  Location: [facility];  Service: Gynecology;  Laterality: N/A;  . WISDOM TOOTH EXTRACTION Bilateral 2004    Past Medical Hx:  Past Medical History:  Diagnosis Date  . Anemia   . Arthritis   . Back pain   . Bilateral swelling of feet   . Breast cancer ([place])    at age 65  . Constipation   . Family history of breast cancer   . Family history of colon cancer   . Family history of prostate cancer in father   . Family history of uterine cancer   . History of radiation therapy   . Joint pain   . Lymph edema    right arm   . Musculoskeletal pain   . Other fatigue   . PONV (postoperative nausea and vomiting)    . Pre-diabetes   . Shortness of breath on exertion   . Sleep apnea    pt has nose pillows for cpap per pt - will bring DOS   . Vertigo     Past Gynecological History:  See HPI No LMP recorded. Patient has had a hysterectomy.  Family Hx:  Family History  Problem Relation Age of Onset  . Stroke Mother   . Hyperlipidemia Mother   . Skin cancer Mother   . Breast cancer Mother 11       ? recurrence at 38  . Endometrial cancer Mother 60  . Hypertension Mother   . Cancer Mother   . Obesity Mother   . Diabetes Father   . Hyperlipidemia Father   . Heart attack Father 21  . Colon cancer Father 93  . Prostate cancer Father  81       [place] 9  . Hypertension Father   . Heart disease Father   . Cancer Father   . Sleep apnea Father   . Alcoholism Father   . Obesity Father   . Diabetes Paternal Grandmother   . Heart attack Paternal Grandmother   . Rheum arthritis Paternal Grandmother   . Hyperlipidemia Sister   . Heart attack Paternal Grandfather   . Heart attack Maternal Grandfather   . Heart attack Maternal Grandmother   . Skin cancer Maternal Grandmother   . Rheum arthritis Maternal Grandmother   . Breast cancer Sister 76  . Lymphoma Maternal Aunt 65  . Kidney cancer Maternal Aunt 60  . Cervical cancer Cousin 50    Review of Systems:  Constitutional  Feels well,   ENT Normal appearing ears and nares bilaterally Skin/Breast  No rash, sores, jaundice, itching, dryness Cardiovascular  No chest pain, shortness of breath, or edema  Pulmonary  No cough or wheeze.  Gastro Intestinal  No nausea, vomitting, or diarrhoea. No bright red blood per rectum, no abdominal pain, change in bowel movement, or constipation.  Genito Urinary  No frequency, urgency, dysuria, no bleeding Musculo Skeletal  No myalgia, arthralgia, joint swelling or pain  Neurologic  No weakness, numbness, change in gait,  Psychology  No depression, anxiety, insomnia.   Vitals:  Blood pressure  125/65, pulse 62, temperature 97.9 F (36.6 C), temperature source Tympanic, resp. rate 18, weight 254 lb 6.4 oz (115.4 kg), SpO2 99 %.  Physical Exam: WD in NAD Neck  Supple NROM, without any enlargements.  Lymph Node Survey No cervical supraclavicular or inguinal adenopathy Cardiovascular  Well perfused peripheries Lungs  No increased WOB Skin  No rash/lesions/breakdown  Psychiatry  Alert and oriented to person, place, and time  Abdomen  Normoactive bowel sounds, abdomen soft, non-tender and obese without evidence of hernia.  Back No CVA tenderness Genito Urinary  Vaginal cuff smooth, no lesions, no masses, no bleeding.  Rectal  deferred Extremities  No bilateral cyanosis, clubbing or edema.   Thereasa Solo, MD  04/12/2020, 2:40 PM

## 2020-04-12 ENCOUNTER — Other Ambulatory Visit: Payer: Self-pay

## 2020-04-12 ENCOUNTER — Inpatient Hospital Stay: Payer: 59 | Attending: Gynecologic Oncology | Admitting: Gynecologic Oncology

## 2020-04-12 ENCOUNTER — Encounter: Payer: Self-pay | Admitting: Gynecologic Oncology

## 2020-04-12 VITALS — BP 125/65 | HR 62 | Temp 97.9°F | Resp 18 | Wt 254.4 lb

## 2020-04-12 DIAGNOSIS — Z90710 Acquired absence of both cervix and uterus: Secondary | ICD-10-CM | POA: Diagnosis not present

## 2020-04-12 DIAGNOSIS — Z8542 Personal history of malignant neoplasm of other parts of uterus: Secondary | ICD-10-CM | POA: Diagnosis present

## 2020-04-12 DIAGNOSIS — Z923 Personal history of irradiation: Secondary | ICD-10-CM | POA: Insufficient documentation

## 2020-04-12 DIAGNOSIS — Z90722 Acquired absence of ovaries, bilateral: Secondary | ICD-10-CM | POA: Insufficient documentation

## 2020-04-12 DIAGNOSIS — Z79899 Other long term (current) drug therapy: Secondary | ICD-10-CM | POA: Insufficient documentation

## 2020-04-12 DIAGNOSIS — Z9221 Personal history of antineoplastic chemotherapy: Secondary | ICD-10-CM | POA: Insufficient documentation

## 2020-04-12 DIAGNOSIS — Z08 Encounter for follow-up examination after completed treatment for malignant neoplasm: Secondary | ICD-10-CM | POA: Diagnosis present

## 2020-04-12 DIAGNOSIS — Z87891 Personal history of nicotine dependence: Secondary | ICD-10-CM | POA: Insufficient documentation

## 2020-04-12 DIAGNOSIS — C541 Malignant neoplasm of endometrium: Secondary | ICD-10-CM

## 2020-04-12 MED ORDER — VITAMIN D (ERGOCALCIFEROL) 1.25 MG (50000 UNIT) PO CAPS: 50000.0000 [IU] | ORAL_CAPSULE | ORAL | 0 refills | Status: DC

## 2020-04-12 NOTE — Patient Instructions (Signed)
Please notify Dr [name] at phone number [phone] if you notice vaginal bleeding, new pelvic or abdominal pains, bloating, feeling full easy, or a change in bladder or bowel function.   Please have Dr Calton Dach office contact Dr Serita Grit office (at [phone]) in June after your appointment with her to request an appointment with Dr [name] for September.

## 2020-04-12 NOTE — Telephone Encounter (Signed)
Last OV with Dr. Beasley 

## 2020-04-13 NOTE — Progress Notes (Signed)
Chief Complaint:   OBESITY Patricia Waters is here to discuss her progress with her obesity treatment plan along with follow-up of her obesity related diagnoses. Patricia Waters is on the Category 3 Plan and states she is following her eating plan approximately 99.9% of the time. Patricia Waters states she is doing 0 minutes 0 times per week.  Today's visit was #: 2 Starting weight: 258 lbs Starting date: 03/24/2020 Today's weight: 254 lbs Today's date: 04/07/2020 Total lbs lost to date: 4 Total lbs lost since last in-office visit: 4  Interim History: Patricia Waters has done very well with weight loss on her Category 3 plan. She did well with meal planning and eating most of her foods. Her hunger was mostly controlled.  Subjective:   1. Vitamin D deficiency Patricia Waters is on Vit D OTC, and her level is not yet at goal. She notes fatigue. I discussed labs with the patient today.  2. Insulin resistance Patricia Waters has a new diagnosis of insulin resistance. Her fasting insulin is elevated and she has some elevated glucose readings, but normal low A1c. She notes polyphagia but this has improved with her Category 3 eating plan. I discussed labs with the patient today.  3. At risk for diabetes mellitus Patricia Waters is at higher than average risk for developing diabetes due to obesity.   Assessment/Plan:   1. Vitamin D deficiency Low Vitamin D level contributes to fatigue and are associated with obesity, breast, and colon cancer. Patricia Waters agreed to hold OTC Vit D, and start prescription Vitamin D 50,000 IU every week with no refills. She will follow-up for routine testing of Vitamin D, at least 2-3 times per year to avoid over-replacement.  - Vitamin D, Ergocalciferol, (DRISDOL) 1.25 MG (50000 UNIT) CAPS capsule; Take 1 capsule (50,000 Units total) by mouth every 7 (seven) days.  Dispense: 4 capsule; Refill: 0  2. Insulin resistance Patricia Waters will continue her meal plan, and will continue to work on weight loss, exercise, and  decreasing simple carbohydrates to help decrease the risk of diabetes. We will recheck labs in 3 months. Patricia Waters agreed to follow-up with [name] as directed to closely monitor her progress.  3. At risk for diabetes mellitus Patricia Waters was given approximately 30 minutes of diabetes education and counseling today. We discussed intensive lifestyle modifications today with an emphasis on weight loss as well as increasing exercise and decreasing simple carbohydrates in her diet. We also reviewed medication options with an emphasis on risk versus benefit of those discussed.   Repetitive spaced learning was employed today to elicit superior memory formation and behavioral change.  4. Class 3 severe obesity with serious comorbidity and body mass index (BMI) of 40.0 to 44.9 in adult, unspecified obesity type (HCC) Patricia Waters is currently in the action stage of change. As such, her goal is to continue with weight loss efforts. She has agreed to the Category 3 Plan with lunch options.   Behavioral modification strategies: increasing lean protein intake and meal planning and cooking strategies.  Patricia Waters has agreed to follow-up with our clinic in 2 weeks. She was informed of the importance of frequent follow-up visits to maximize her success with intensive lifestyle modifications for her multiple health conditions.   Objective:   Blood pressure 132/81, pulse 88, temperature 98.5 F (36.9 C), height 5\' 4"  (1.626 m), weight 254 lb (115.2 kg), SpO2 96 %. Body mass index is 43.6 kg/m.  General: Cooperative, alert, well developed, in no acute distress. HEENT: Conjunctivae and lids unremarkable. Cardiovascular: Regular rhythm.  Lungs: Normal work of breathing. Neurologic: No focal deficits.   Lab Results  Component Value Date   CREATININE 0.80 01/19/2020   BUN 8 01/19/2020   NA 141 01/19/2020   K 4.0 01/19/2020   CL 105 01/19/2020   CO2 26 01/19/2020   Lab Results  Component Value Date   ALT 27 01/19/2020    AST 28 01/19/2020   ALKPHOS 147 (H) 01/19/2020   BILITOT 0.5 01/19/2020   Lab Results  Component Value Date   HGBA1C 4.9 03/24/2020   HGBA1C 5.7 (A) 12/22/2019   HGBA1C 5.4 03/24/2019   HGBA1C 5.2 09/17/2018   HGBA1C 5.2 01/02/2018   Lab Results  Component Value Date   INSULIN 10.2 03/24/2020   Lab Results  Component Value Date   TSH 3.210 03/24/2020   Lab Results  Component Value Date   CHOL 214 (H) 03/24/2020   HDL 55 03/24/2020   LDLCALC 127 (H) 03/24/2020   TRIG 180 (H) 03/24/2020   CHOLHDL 3.3 03/24/2019   Lab Results  Component Value Date   WBC 6.0 01/19/2020   HGB 8.3 (L) 01/19/2020   HCT 25.0 (L) 01/19/2020   MCV 116.3 (H) 01/19/2020   PLT 136 (L) 01/19/2020   Lab Results  Component Value Date   IRON 85 11/25/2015   TIBC 345 11/25/2015   FERRITIN 89 11/25/2015   Attestation Statements:   Reviewed by clinician on day of visit: allergies, medications, problem list, medical history, surgical history, family history, social history, and previous encounter notes.   I, Trixie Dredge, am acting as transcriptionist for [name], MD.  I have reviewed the above documentation for accuracy and completeness, and I agree with the above. -  **[name], MD *

## 2020-04-21 ENCOUNTER — Other Ambulatory Visit: Payer: Self-pay

## 2020-04-21 ENCOUNTER — Ambulatory Visit (INDEPENDENT_AMBULATORY_CARE_PROVIDER_SITE_OTHER): Payer: 59 | Admitting: Family Medicine

## 2020-04-21 ENCOUNTER — Encounter (INDEPENDENT_AMBULATORY_CARE_PROVIDER_SITE_OTHER): Payer: Self-pay | Admitting: Family Medicine

## 2020-04-21 VITALS — BP 119/80 | HR 67 | Temp 98.5°F | Ht 64.0 in | Wt 250.0 lb

## 2020-04-21 DIAGNOSIS — Z9189 Other specified personal risk factors, not elsewhere classified: Secondary | ICD-10-CM

## 2020-04-21 DIAGNOSIS — Z6841 Body Mass Index (BMI) 40.0 and over, adult: Secondary | ICD-10-CM

## 2020-04-21 DIAGNOSIS — E7849 Other hyperlipidemia: Secondary | ICD-10-CM | POA: Diagnosis not present

## 2020-04-21 DIAGNOSIS — E559 Vitamin D deficiency, unspecified: Secondary | ICD-10-CM | POA: Diagnosis not present

## 2020-04-21 MED ORDER — VITAMIN D (ERGOCALCIFEROL) 1.25 MG (50000 UNIT) PO CAPS: 50000.0000 [IU] | ORAL_CAPSULE | ORAL | 0 refills | Status: DC

## 2020-04-26 NOTE — Progress Notes (Signed)
Chief Complaint:   OBESITY Patricia Waters is here to discuss her progress with her obesity treatment plan along with follow-up of her obesity related diagnoses. Patricia Waters is on the Category 3 Plan with lunch options and states she is following her eating plan approximately 90% of the time. Patricia Waters states she is walking and gardening for 60-120 minutes 3 times per week, and doing daily activities.  Today's visit was #: 3 Starting weight: 258 lbs Starting date: 03/24/2020 Today's weight: 250 lbs Today's date: 04/21/2020 Total lbs lost to date: 8 Total lbs lost since last in-office visit: 4  Interim History: Patricia Waters continues to do well with weight loss on her plan. She is getting bored with breakfast and she would like more options. Her hunger is well controlled, and she has increased her activity this Spring. She will be going on vacation soon.  Subjective:   1. Vitamin D deficiency Patricia Waters is stable on Vit D, and she denies signs of over-replacement.  2. Other hyperlipidemia Patricia Waters is working on diet, exercise, and weight loss to help improve her cholesterol levels. She denies chest pain, and she is doing well with weight loss.  3. At risk for impaired metabolic function Patricia Waters is at increased risk for impaired metabolic function due to continue weight loss, especially if her protein decreases.  Assessment/Plan:   1. Vitamin D deficiency Low Vitamin D level contributes to fatigue and are associated with obesity, breast, and colon cancer. We will refill prescription Vitamin D for 1 month. Patricia Waters will follow-up for routine testing of Vitamin D, at least 2-3 times per year to avoid over-replacement.  - Vitamin D, Ergocalciferol, (DRISDOL) 1.25 MG (50000 UNIT) CAPS capsule; Take 1 capsule (50,000 Units total) by mouth every 7 (seven) days.  Dispense: 4 capsule; Refill: 0  2. Other hyperlipidemia Cardiovascular risk and specific lipid/LDL goals reviewed. We discussed several lifestyle  modifications today. Patricia Waters will continue to work on diet, exercise and weight loss efforts.we will recheck labs in 1-2 months. Orders and follow up as documented in patient record.   3. At risk for impaired metabolic function Patricia Waters was given approximately 15 minutes of impaired  metabolic function prevention counseling today. We discussed intensive lifestyle modifications today with an emphasis on specific nutrition and exercise instructions and strategies.   Repetitive spaced learning was employed today to elicit superior memory formation and behavioral change.  4. Class 3 severe obesity with serious comorbidity and body mass index (BMI) of 40.0 to 44.9 in adult, unspecified obesity type (HCC) Patricia Waters is currently in the action stage of change. As such, her goal is to continue with weight loss efforts. She has agreed to the Category 3 Plan.   Exercise goals: As is.  Behavioral modification strategies: travel eating strategies and celebration eating strategies.  Patricia Waters has agreed to follow-up with our clinic in 3 weeks. She was informed of the importance of frequent follow-up visits to maximize her success with intensive lifestyle modifications for her multiple health conditions.   Objective:   Blood pressure 119/80, pulse 67, temperature 98.5 F (36.9 C), height 5\' 4"  (1.626 m), weight 250 lb (113.4 kg), SpO2 96 %. Body mass index is 42.91 kg/m.  General: Cooperative, alert, well developed, in no acute distress. HEENT: Conjunctivae and lids unremarkable. Cardiovascular: Regular rhythm.  Lungs: Normal work of breathing. Neurologic: No focal deficits.   Lab Results  Component Value Date   CREATININE 0.80 01/19/2020   BUN 8 01/19/2020   NA 141 01/19/2020  K 4.0 01/19/2020   CL 105 01/19/2020   CO2 26 01/19/2020   Lab Results  Component Value Date   ALT 27 01/19/2020   AST 28 01/19/2020   ALKPHOS 147 (H) 01/19/2020   BILITOT 0.5 01/19/2020   Lab Results  Component  Value Date   HGBA1C 4.9 03/24/2020   HGBA1C 5.7 (A) 12/22/2019   HGBA1C 5.4 03/24/2019   HGBA1C 5.2 09/17/2018   HGBA1C 5.2 01/02/2018   Lab Results  Component Value Date   INSULIN 10.2 03/24/2020   Lab Results  Component Value Date   TSH 3.210 03/24/2020   Lab Results  Component Value Date   CHOL 214 (H) 03/24/2020   HDL 55 03/24/2020   LDLCALC 127 (H) 03/24/2020   TRIG 180 (H) 03/24/2020   CHOLHDL 3.3 03/24/2019   Lab Results  Component Value Date   WBC 6.0 01/19/2020   HGB 8.3 (L) 01/19/2020   HCT 25.0 (L) 01/19/2020   MCV 116.3 (H) 01/19/2020   PLT 136 (L) 01/19/2020   Lab Results  Component Value Date   IRON 85 11/25/2015   TIBC 345 11/25/2015   FERRITIN 89 11/25/2015   Attestation Statements:   Reviewed by clinician on day of visit: allergies, medications, problem list, medical history, surgical history, family history, social history, and previous encounter notes.   I, Trixie Dredge, am acting as transcriptionist for [name], MD.  I have reviewed the above documentation for accuracy and completeness, and I agree with the above. -  [name], MD

## 2020-05-04 ENCOUNTER — Ambulatory Visit: Payer: 59 | Admitting: Family Medicine

## 2020-05-04 ENCOUNTER — Other Ambulatory Visit: Payer: Self-pay

## 2020-05-04 VITALS — BP 120/65 | HR 67 | Ht 64.0 in | Wt 250.0 lb

## 2020-05-04 DIAGNOSIS — G4733 Obstructive sleep apnea (adult) (pediatric): Secondary | ICD-10-CM | POA: Diagnosis not present

## 2020-05-04 DIAGNOSIS — Z636 Dependent relative needing care at home: Secondary | ICD-10-CM

## 2020-05-04 DIAGNOSIS — R7301 Impaired fasting glucose: Secondary | ICD-10-CM

## 2020-05-04 DIAGNOSIS — G4761 Periodic limb movement disorder: Secondary | ICD-10-CM

## 2020-05-04 DIAGNOSIS — M545 Low back pain, unspecified: Secondary | ICD-10-CM

## 2020-05-04 NOTE — Progress Notes (Addendum)
Established Patient Office Visit  Subjective:  Patient ID: Patricia Waters, female    DOB: 08/21/1955  Age: 65 y.o. MRN: [id number]  CC:  Chief Complaint  Patient presents with  . Follow-up    HPI Patricia Waters presents for follow up obstructive sleep apnea.  She currently gets her supplies through [facility] and pressure is set 11 cm water pressure.  He says she wears it every night for at least 4 hours she is cleaning the equipment regularly.  She feels like it is effective and wakes up feeling refreshed.  She likes her new machine she has not downloaded the app yet.  She has also been working with healthy weight and wellness, Dr. Leafy Ro.  And has been successfully losing weight so far.  She did start her on a vitamin D supplement.  And she has been more careful with her dietary choices she is also been active with walking and gardening.  Just got back from the beach.  She has been doing okay emotionally, her father passed away in February 07, 2023.  She is trying to get her mom who is also my patient to move in with either her or her sister as she is becoming more forgetful.  So this has been a little stressful.  Periodic limb movement-currently using ropinirole she feels like it is effective and working well.  Past Medical History:  Diagnosis Date  . Anemia   . Arthritis   . Back pain   . Bilateral swelling of feet   . Breast cancer ([place])    at age 15  . Constipation   . Family history of breast cancer   . Family history of colon cancer   . Family history of prostate cancer in father   . Family history of uterine cancer   . History of radiation therapy   . Joint pain   . Lymph edema    right arm   . Musculoskeletal pain   . Other fatigue   . PONV (postoperative nausea and vomiting)   . Pre-diabetes   . Shortness of breath on exertion   . Sleep apnea    pt has nose pillows for cpap per pt - will bring DOS   . Vertigo     Past Surgical History:  Procedure Laterality  Date  . BACK SURGERY    . 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    . BILATERAL TOTAL MASTECTOMY WITH AXILLARY LYMPH NODE DISSECTION Bilateral 2005  . BREAST LUMPECTOMY  1994   right  . CERVICAL FUSION  1995   C5-C7  . IR IMAGING GUIDED PORT INSERTION  09/11/2019  . MASTECTOMY  2005   Bilat   . ROBOTIC ASSISTED TOTAL HYSTERECTOMY WITH BILATERAL SALPINGO OOPHERECTOMY N/A 08/14/2019   Procedure: XI ROBOTIC ASSISTED TOTAL HYSTERECTOMY WITH BILATERAL SALPINGO OOPHORECTOMY;  Surgeon: [name], MD;  Location: [facility];  Service: Gynecology;  Laterality: N/A;  . SENTINEL NODE BIOPSY N/A 08/14/2019   Procedure: SENTINEL NODE BIOPSY;  Surgeon: [name], MD;  Location: [facility];  Service: Gynecology;  Laterality: N/A;  . WISDOM TOOTH EXTRACTION Bilateral 2004    Family History  Problem Relation Age of Onset  . Stroke Mother   . Hyperlipidemia Mother   . Skin cancer Mother   . Breast cancer Mother 65       ? recurrence at 72  . Endometrial cancer Mother 46  . Hypertension Mother   . Cancer Mother   .  Obesity Mother   . Diabetes Father   . Hyperlipidemia Father   . Heart attack Father 61  . Colon cancer Father 20  . Prostate cancer Father 23       [place] 9  . Hypertension Father   . Heart disease Father   . Cancer Father   . Sleep apnea Father   . Alcoholism Father   . Obesity Father   . Diabetes Paternal Grandmother   . Heart attack Paternal Grandmother   . Rheum arthritis Paternal Grandmother   . Hyperlipidemia Sister   . Heart attack Paternal Grandfather   . Heart attack Maternal Grandfather   . Heart attack Maternal Grandmother   . Skin cancer Maternal Grandmother   . Rheum arthritis Maternal Grandmother   . Breast cancer Sister 80  . Lymphoma Maternal Aunt 65  . Kidney cancer Maternal Aunt 60  . Cervical cancer Cousin 45    Social History   Socioeconomic History  . Marital status: Married    Spouse name: [name]  . Number of children: 0  .  Years of education: Not on file  . Highest education level: Not on file  Occupational History  . 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[employer]  Tobacco Use  . Smoking status: Former Smoker    Types: Cigarettes    Quit date: 10/31/2006    Years since quitting: 13.5  . Smokeless tobacco: Never Used  Vaping Use  . Vaping Use: Never used  Substance and Sexual Activity  . Alcohol use: Not Currently  . Drug use: No  . Sexual activity: Not Currently    Partners: Female    Birth control/protection: None  Other Topics Concern  . Not on file  Social History Narrative   Lives with her partner, [name]. Former Therapist, nutritional.    Social Determinants of Health   Financial Resource Strain: Not on file  Food Insecurity: Not on file  Transportation Needs: Not on file  Physical Activity: Not on file  Stress: Not on file  Social Connections: Not on file  Intimate Partner Violence: Not on file    Outpatient Medications Prior to Visit  Medication Sig Dispense Refill  . AMBULATORY NON FORMULARY MEDICATION Medication Name: CPAP with humidifier with 11 cm water pressure.  Dx OSA.  See attached Sleep Study.  Please fax to [facility]. 1 Units 0  . CALCIUM-VITAMIN D PO Take 1 tablet by mouth daily.    . DULoxetine (CYMBALTA) 60 MG capsule TAKE 1 CAPSULE BY MOUTH  DAILY 90 capsule 3  . fluticasone (FLONASE) 50 MCG/ACT nasal spray Place 2 sprays into both nostrils daily as needed for allergies or rhinitis.    Marland Kitchen gabapentin (NEURONTIN) 300 MG capsule TAKE 2 CAPSULES BY MOUTH  DAILY 180 capsule 3  . Krill Oil 500 MG CAPS Take 1 capsule by mouth daily.    Marland Kitchen lidocaine-prilocaine (EMLA) cream Apply 1 application topically as needed.    . Multiple Vitamins-Minerals (CENTRUM SILVER 50+WOMEN) TABS Take 1 capsule by mouth daily.    . Multiple Vitamins-Minerals (PRESERVISION AREDS) CAPS Take 1 capsule by mouth daily.    . polyethylene glycol (MIRALAX /  GLYCOLAX) 17 g packet Take 17 g by mouth daily as needed.    Marland Kitchen rOPINIRole (REQUIP) 0.5 MG tablet TAKE 1 TABLET BY MOUTH AT  BEDTIME 90 tablet 3  . vitamin C (ASCORBIC ACID) 500 MG tablet Take 500 mg by mouth daily.    Marland Kitchen  Vitamin D, Ergocalciferol, (DRISDOL) 1.25 MG (50000 UNIT) CAPS capsule Take 1 capsule (50,000 Units total) by mouth every 7 (seven) days. 4 capsule 0   No facility-administered medications prior to visit.    No Known Allergies  ROS Review of Systems    Objective:    Physical Exam Constitutional:      Appearance: She is well-developed.  HENT:     Head: Normocephalic and atraumatic.  Cardiovascular:     Rate and Rhythm: Normal rate and regular rhythm.     Heart sounds: Normal heart sounds.  Pulmonary:     Effort: Pulmonary effort is normal.     Breath sounds: Normal breath sounds.  Skin:    General: Skin is warm and dry.  Neurological:     Mental Status: She is alert and oriented to person, place, and time.  Psychiatric:        Behavior: Behavior normal.     BP 120/65   Pulse 67   Ht '5\' 4"'  (1.626 m)   Wt 250 lb (113.4 kg)   SpO2 93%   BMI 42.91 kg/m  Wt Readings from Last 3 Encounters:  05/04/20 250 lb (113.4 kg)  04/21/20 250 lb (113.4 kg)  04/12/20 254 lb 6.4 oz (115.4 kg)     Health Maintenance Due  Topic Date Due  . COVID-19 Vaccine (4 - Booster for Pfizer series) 04/01/2020    There are no preventive care reminders to display for this patient.  Lab Results  Component Value Date   TSH 3.210 03/24/2020   Lab Results  Component Value Date   WBC 6.0 01/19/2020   HGB 8.3 (L) 01/19/2020   HCT 25.0 (L) 01/19/2020   MCV 116.3 (H) 01/19/2020   PLT 136 (L) 01/19/2020   Lab Results  Component Value Date   NA 141 01/19/2020   K 4.0 01/19/2020   CHLORIDE 103 01/20/2014   CO2 26 01/19/2020   GLUCOSE 111 (H) 01/19/2020   BUN 8 01/19/2020   CREATININE 0.80 01/19/2020   BILITOT 0.5 01/19/2020   ALKPHOS 147 (H) 01/19/2020   AST 28  01/19/2020   ALT 27 01/19/2020   PROT 7.4 01/19/2020   ALBUMIN 3.8 01/19/2020   CALCIUM 9.6 01/19/2020   ANIONGAP 10 01/19/2020   EGFR 77 (L) 01/20/2014   Lab Results  Component Value Date   CHOL 214 (H) 03/24/2020   Lab Results  Component Value Date   HDL 55 03/24/2020   Lab Results  Component Value Date   LDLCALC 127 (H) 03/24/2020   Lab Results  Component Value Date   TRIG 180 (H) 03/24/2020   Lab Results  Component Value Date   CHOLHDL 3.3 03/24/2019   Lab Results  Component Value Date   HGBA1C 4.9 03/24/2020      Assessment & Plan:   Problem List Items Addressed This Visit      Respiratory   Sleep apnea, obstructive - Primary      Seeing her CPAP consistently nightly for minimum of 4 hours and getting good results.  We will try to call and get a download from [facility].  Will fax over notes and information to them to verify with her insurance that she is using the machine and it is providing adequate treatment for her sleep apnea.        Endocrine   IFG (impaired fasting glucose)    Last A1c in February was 4.9 which is absolutely phenomenal.  She is really doing a fantastic  job with improving her diet and with weight loss.        Other   Periodic limb movement    Reports the ropinirole is working well she is happy with her current dosing regimen.      Low back pain    Stable on gabapentin and Cymbalta.      Caregiver stress    Doing well overall feels like she is managing.         No orders of the defined types were placed in this encounter.   Follow-up: Return in about 6 months (around 11/03/2020).    Beatrice Lecher, MD

## 2020-05-04 NOTE — Assessment & Plan Note (Addendum)
Seeing her CPAP consistently nightly for minimum of 4 hours and getting good results.  We will try to call and get a download from [facility].  Will fax over notes and information to them to verify with her insurance that she is using the machine and it is providing adequate treatment for her sleep apnea.

## 2020-05-04 NOTE — Assessment & Plan Note (Signed)
Reports the ropinirole is working well she is happy with her current dosing regimen.

## 2020-05-04 NOTE — Assessment & Plan Note (Signed)
Stable on gabapentin and Cymbalta.

## 2020-05-04 NOTE — Assessment & Plan Note (Signed)
Doing well overall feels like she is managing.

## 2020-05-05 NOTE — Assessment & Plan Note (Signed)
Last A1c in February was 4.9 which is absolutely phenomenal.  She is really doing a fantastic job with improving her diet and with weight loss.

## 2020-05-11 ENCOUNTER — Inpatient Hospital Stay: Payer: 59 | Attending: Hematology and Oncology

## 2020-05-11 ENCOUNTER — Other Ambulatory Visit: Payer: Self-pay

## 2020-05-11 DIAGNOSIS — C541 Malignant neoplasm of endometrium: Secondary | ICD-10-CM

## 2020-05-11 DIAGNOSIS — Z452 Encounter for adjustment and management of vascular access device: Secondary | ICD-10-CM | POA: Insufficient documentation

## 2020-05-11 MED ORDER — HEPARIN SOD (PORK) LOCK FLUSH 100 UNIT/ML IV SOLN
500.0000 [IU] | Freq: Once | INTRAVENOUS | Status: AC
  Administered 2020-05-11: 500 [IU]
  Filled 2020-05-11: qty 5

## 2020-05-11 MED ORDER — SODIUM CHLORIDE 0.9% FLUSH
10.0000 mL | Freq: Once | INTRAVENOUS | Status: AC
  Administered 2020-05-11: 10 mL
  Filled 2020-05-11: qty 10

## 2020-05-12 ENCOUNTER — Ambulatory Visit (INDEPENDENT_AMBULATORY_CARE_PROVIDER_SITE_OTHER): Payer: 59 | Admitting: Family Medicine

## 2020-05-12 ENCOUNTER — Encounter (INDEPENDENT_AMBULATORY_CARE_PROVIDER_SITE_OTHER): Payer: Self-pay | Admitting: Family Medicine

## 2020-05-12 ENCOUNTER — Other Ambulatory Visit: Payer: Self-pay

## 2020-05-12 VITALS — BP 112/75 | HR 66 | Temp 98.4°F | Ht 64.0 in | Wt 241.0 lb

## 2020-05-12 DIAGNOSIS — R7303 Prediabetes: Secondary | ICD-10-CM | POA: Diagnosis not present

## 2020-05-12 DIAGNOSIS — Z6841 Body Mass Index (BMI) 40.0 and over, adult: Secondary | ICD-10-CM

## 2020-05-20 NOTE — Progress Notes (Signed)
Chief Complaint:   OBESITY Patricia Waters is here to discuss her progress with her obesity treatment plan along with follow-up of her obesity related diagnoses. Patricia Waters is on the Category 3 Plan and states she is following her eating plan approximately 90% of the time. Patricia Waters states she is walking and gardening in variables 3 times per week.  Today's visit was #: 4 Starting weight: 258 lbs Starting date: 03/24/2020 Today's weight: 241 lbs Today's date: 05/12/2020 Total lbs lost to date: 17 Total lbs lost since last in-office visit: 9  Interim History: Patricia Waters continues to do very well with weight loss. She is working on meal planning and trying to increase activity. She is getting a bit bored with dinner, and she would like to find a way to have more options.  Subjective:   1. Pre-diabetes Patricia Waters continues to work on decreasing simple carbohydrates and she is doing well with weight loss. She denies symptoms of hypoglycemia.  Assessment/Plan:   1. Pre-diabetes Patricia Waters will continue to work on weight loss, diet, exercise, and decreasing simple carbohydrates to help decrease the risk of diabetes. Patricia Waters will continue to follow up as directed.  2. Obesity with current BMI of 41.5 Patricia Waters is currently in the action stage of change. As such, her goal is to continue with weight loss efforts. She has agreed to the Category 3 Plan and keeping a food journal and adhering to recommended goals of 400-600 calories and 40+ grams of protein at supper daily.   Shredded chicken recipe options was given to the patient today.  Exercise goals: As is.  Behavioral modification strategies: meal planning and cooking strategies.  Patricia Waters has agreed to follow-up with our clinic in 3 to 4 weeks. She was informed of the importance of frequent follow-up visits to maximize her success with intensive lifestyle modifications for her multiple health conditions.   Objective:   Blood pressure 112/75, pulse 66,  temperature 98.4 F (36.9 C), height 5\' 4"  (1.626 m), weight 241 lb (109.3 kg), SpO2 96 %. Body mass index is 41.37 kg/m.  General: Cooperative, alert, well developed, in no acute distress. HEENT: Conjunctivae and lids unremarkable. Cardiovascular: Regular rhythm.  Lungs: Normal work of breathing. Neurologic: No focal deficits.   Lab Results  Component Value Date   CREATININE 0.80 01/19/2020   BUN 8 01/19/2020   NA 141 01/19/2020   K 4.0 01/19/2020   CL 105 01/19/2020   CO2 26 01/19/2020   Lab Results  Component Value Date   ALT 27 01/19/2020   AST 28 01/19/2020   ALKPHOS 147 (H) 01/19/2020   BILITOT 0.5 01/19/2020   Lab Results  Component Value Date   HGBA1C 4.9 03/24/2020   HGBA1C 5.7 (A) 12/22/2019   HGBA1C 5.4 03/24/2019   HGBA1C 5.2 09/17/2018   HGBA1C 5.2 01/02/2018   Lab Results  Component Value Date   INSULIN 10.2 03/24/2020   Lab Results  Component Value Date   TSH 3.210 03/24/2020   Lab Results  Component Value Date   CHOL 214 (H) 03/24/2020   HDL 55 03/24/2020   LDLCALC 127 (H) 03/24/2020   TRIG 180 (H) 03/24/2020   CHOLHDL 3.3 03/24/2019   Lab Results  Component Value Date   WBC 6.0 01/19/2020   HGB 8.3 (L) 01/19/2020   HCT 25.0 (L) 01/19/2020   MCV 116.3 (H) 01/19/2020   PLT 136 (L) 01/19/2020   Lab Results  Component Value Date   IRON 85 11/25/2015   TIBC 345  11/25/2015   FERRITIN 89 11/25/2015   Attestation Statements:   Reviewed by clinician on day of visit: allergies, medications, problem list, medical history, surgical history, family history, social history, and previous encounter notes.  Time spent on visit including pre-visit chart review and post-visit care and charting was 30 minutes.    I, Trixie Dredge, am acting as transcriptionist for [name], MD.  I have reviewed the above documentation for accuracy and completeness, and I agree with the above. -  [name], MD

## 2020-05-26 ENCOUNTER — Other Ambulatory Visit: Payer: Self-pay

## 2020-05-26 ENCOUNTER — Ambulatory Visit (INDEPENDENT_AMBULATORY_CARE_PROVIDER_SITE_OTHER): Payer: 59 | Admitting: Family Medicine

## 2020-05-26 ENCOUNTER — Encounter (INDEPENDENT_AMBULATORY_CARE_PROVIDER_SITE_OTHER): Payer: Self-pay | Admitting: Family Medicine

## 2020-05-26 VITALS — BP 118/76 | HR 58 | Temp 97.8°F | Ht 64.0 in | Wt 242.0 lb

## 2020-05-26 DIAGNOSIS — E559 Vitamin D deficiency, unspecified: Secondary | ICD-10-CM

## 2020-05-26 DIAGNOSIS — Z9189 Other specified personal risk factors, not elsewhere classified: Secondary | ICD-10-CM | POA: Diagnosis not present

## 2020-05-26 DIAGNOSIS — Z6841 Body Mass Index (BMI) 40.0 and over, adult: Secondary | ICD-10-CM | POA: Diagnosis not present

## 2020-05-26 MED ORDER — VITAMIN D (ERGOCALCIFEROL) 1.25 MG (50000 UNIT) PO CAPS: 50000.0000 [IU] | ORAL_CAPSULE | ORAL | 0 refills | Status: DC

## 2020-05-27 NOTE — Progress Notes (Signed)
Chief Complaint:   OBESITY Patricia Waters is here to discuss her progress with her obesity treatment plan along with follow-up of her obesity related diagnoses. Patricia Waters is on the Category 3 Plan and keeping a food journal and adhering to recommended goals of 400-600 calories and 40+ grams of protein at supper daily and states she is following her eating plan approximately 90% of the time. Patricia Waters states she is walking and gardening for 30 minutes 3 times per week.  Today's visit was #: 5 Starting weight: 258 lbs Starting date: 03/24/2020 Today's weight: 242 lbs Today's date: 05/27/2020 Total lbs lost to date: 16 Total lbs lost since last in-office visit: 0  Interim History: Patricia Waters has been on vacation and she has had more celebrations in the last few weeks. She likes the freedom of dinner journaling and would like a few more recipes.  Subjective:   1. Vitamin D deficiency Patricia Waters is stable on Vit D, and she denies nausea, vomiting, or muscle weakness.  2. At risk for osteoporosis Patricia Waters is at higher risk of osteopenia and osteoporosis due to Vitamin D deficiency.   Assessment/Plan:   1. Vitamin D deficiency Low Vitamin D level contributes to fatigue and are associated with obesity, breast, and colon cancer. We will refill prescription Vitamin D for 1 month, and we will recheck labs at her next visit. Patricia Waters will follow-up for routine testing of Vitamin D, at least 2-3 times per year to avoid over-replacement.  - Vitamin D, Ergocalciferol, (DRISDOL) 1.25 MG (50000 UNIT) CAPS capsule; Take 1 capsule (50,000 Units total) by mouth every 7 (seven) days.  Dispense: 4 capsule; Refill: 0  2. At risk for osteoporosis Patricia Waters was given approximately 15 minutes of osteoporosis prevention counseling today. Patricia Waters is at risk for osteopenia and osteoporosis due to her Vitamin D deficiency. She was encouraged to take her Vitamin D and follow her higher calcium diet and increase strengthening  exercise to help strengthen her bones and decrease her risk of osteopenia and osteoporosis.  Repetitive spaced learning was employed today to elicit superior memory formation and behavioral change.  3. Obesity with current BMI of 41.5 Patricia Waters is currently in the action stage of change. As such, her goal is to continue with weight loss efforts. She has agreed to the Category 3 Plan and keeping a food journal and adhering to recommended goals of 400-600 calories and 40+ grams of protein daily.   High protein and vegetable recipes were given today.  Exercise goals: As is.  Behavioral modification strategies: increasing lean protein intake, meal planning and cooking strategies and keeping a strict food journal.  Patricia Waters has agreed to follow-up with our clinic in 4 weeks. She was informed of the importance of frequent follow-up visits to maximize her success with intensive lifestyle modifications for her multiple health conditions.   Objective:   Blood pressure 118/76, pulse (!) 58, temperature 97.8 F (36.6 C), height 5\' 4"  (1.626 m), weight 242 lb (109.8 kg), SpO2 98 %. Body mass index is 41.54 kg/m.  General: Cooperative, alert, well developed, in no acute distress. HEENT: Conjunctivae and lids unremarkable. Cardiovascular: Regular rhythm.  Lungs: Normal work of breathing. Neurologic: No focal deficits.   Lab Results  Component Value Date   CREATININE 0.80 01/19/2020   BUN 8 01/19/2020   NA 141 01/19/2020   K 4.0 01/19/2020   CL 105 01/19/2020   CO2 26 01/19/2020   Lab Results  Component Value Date   ALT 27 01/19/2020  AST 28 01/19/2020   ALKPHOS 147 (H) 01/19/2020   BILITOT 0.5 01/19/2020   Lab Results  Component Value Date   HGBA1C 4.9 03/24/2020   HGBA1C 5.7 (A) 12/22/2019   HGBA1C 5.4 03/24/2019   HGBA1C 5.2 09/17/2018   HGBA1C 5.2 01/02/2018   Lab Results  Component Value Date   INSULIN 10.2 03/24/2020   Lab Results  Component Value Date   TSH 3.210  03/24/2020   Lab Results  Component Value Date   CHOL 214 (H) 03/24/2020   HDL 55 03/24/2020   LDLCALC 127 (H) 03/24/2020   TRIG 180 (H) 03/24/2020   CHOLHDL 3.3 03/24/2019   Lab Results  Component Value Date   WBC 6.0 01/19/2020   HGB 8.3 (L) 01/19/2020   HCT 25.0 (L) 01/19/2020   MCV 116.3 (H) 01/19/2020   PLT 136 (L) 01/19/2020   Lab Results  Component Value Date   IRON 85 11/25/2015   TIBC 345 11/25/2015   FERRITIN 89 11/25/2015   Attestation Statements:   Reviewed by clinician on day of visit: allergies, medications, problem list, medical history, surgical history, family history, social history, and previous encounter notes.   I, Trixie Dredge, am acting as transcriptionist for [name], MD.  I have reviewed the above documentation for accuracy and completeness, and I agree with the above. -  [name], MD

## 2020-06-16 ENCOUNTER — Other Ambulatory Visit: Payer: Self-pay

## 2020-06-16 ENCOUNTER — Encounter (INDEPENDENT_AMBULATORY_CARE_PROVIDER_SITE_OTHER): Payer: Self-pay | Admitting: Family Medicine

## 2020-06-16 ENCOUNTER — Ambulatory Visit (INDEPENDENT_AMBULATORY_CARE_PROVIDER_SITE_OTHER): Payer: 59 | Admitting: Family Medicine

## 2020-06-16 VITALS — BP 130/70 | HR 60 | Temp 97.8°F | Ht 64.0 in | Wt 236.0 lb

## 2020-06-16 DIAGNOSIS — E7849 Other hyperlipidemia: Secondary | ICD-10-CM | POA: Diagnosis not present

## 2020-06-16 DIAGNOSIS — E559 Vitamin D deficiency, unspecified: Secondary | ICD-10-CM

## 2020-06-16 DIAGNOSIS — E66813 Obesity, class 3: Secondary | ICD-10-CM

## 2020-06-16 DIAGNOSIS — E88819 Insulin resistance, unspecified: Secondary | ICD-10-CM

## 2020-06-16 DIAGNOSIS — Z9189 Other specified personal risk factors, not elsewhere classified: Secondary | ICD-10-CM | POA: Diagnosis not present

## 2020-06-16 DIAGNOSIS — Z6841 Body Mass Index (BMI) 40.0 and over, adult: Secondary | ICD-10-CM

## 2020-06-16 DIAGNOSIS — E8881 Metabolic syndrome: Secondary | ICD-10-CM

## 2020-06-16 MED ORDER — VITAMIN D (ERGOCALCIFEROL) 1.25 MG (50000 UNIT) PO CAPS: 50000.0000 [IU] | ORAL_CAPSULE | ORAL | 0 refills | Status: DC

## 2020-06-17 LAB — CMP14+EGFR
ALT: 15 IU/L (ref 0–32)
AST: 15 IU/L (ref 0–40)
Albumin/Globulin Ratio: 2 (ref 1.2–2.2)
Albumin: 4.7 g/dL (ref 3.8–4.8)
Alkaline Phosphatase: 150 IU/L — ABNORMAL HIGH (ref 44–121)
BUN/Creatinine Ratio: 16 (ref 12–28)
BUN: 12 mg/dL (ref 8–27)
Bilirubin Total: 0.3 mg/dL (ref 0.0–1.2)
CO2: 23 mmol/L (ref 20–29)
Calcium: 10 mg/dL (ref 8.7–10.3)
Chloride: 101 mmol/L (ref 96–106)
Creatinine, Ser: 0.73 mg/dL (ref 0.57–1.00)
Globulin, Total: 2.4 g/dL (ref 1.5–4.5)
Glucose: 110 mg/dL — ABNORMAL HIGH (ref 65–99)
Potassium: 4.6 mmol/L (ref 3.5–5.2)
Sodium: 141 mmol/L (ref 134–144)
Total Protein: 7.1 g/dL (ref 6.0–8.5)
eGFR: 92 mL/min/{1.73_m2} (ref >59)

## 2020-06-17 LAB — LIPID PANEL WITH LDL/HDL RATIO
Cholesterol, Total: 183 mg/dL (ref 100–199)
HDL: 49 mg/dL (ref >39)
LDL Chol Calc (NIH): 111 mg/dL — ABNORMAL HIGH (ref 0–99)
LDL/HDL Ratio: 2.3 ratio (ref 0.0–3.2)
Triglycerides: 132 mg/dL (ref 0–149)
VLDL Cholesterol Cal: 23 mg/dL (ref 5–40)

## 2020-06-17 LAB — INSULIN, RANDOM: INSULIN: 8.1 u[IU]/mL (ref 2.6–24.9)

## 2020-06-17 LAB — VITAMIN D 25 HYDROXY (VIT D DEFICIENCY, FRACTURES): Vit D, 25-Hydroxy: 68.6 ng/mL (ref 30.0–100.0)

## 2020-06-17 LAB — HEMOGLOBIN A1C
Est. average glucose Bld gHb Est-mCnc: 111 mg/dL
Hgb A1c MFr Bld: 5.5 % (ref 4.8–5.6)

## 2020-06-17 NOTE — Progress Notes (Signed)
   Chief Complaint:   OBESITY Uriel is here to discuss her progress with her obesity treatment plan along with follow-up of her obesity related diagnoses. Maliyah is on the Category 3 Plan and keeping a food journal and adhering to recommended goals of 400-600 calories and 40+ grams of protein at supper daily and states she is following her eating plan approximately 90% of the time. Nyoka states she is gardening (12 hours), and walking for 30 minutes 3 times per week.  Today's visit was #: 6 Starting weight: 258 lbs Starting date: 03/24/2020 Today's weight: 236 lbs Today's date: 06/16/2020 Total lbs lost to date: 22 Total lbs lost since last in-office visit: 6  Interim History: Dyanna continues to do very well with weight loss. She is very active in her yard with gardening, and she feels well overall. She is sleeping approximately 8 hours per night, and her hunger is controlled. She has the option to journal for dinner, but she often simply follows the Category 3 plan.  Subjective:   1. Vitamin D deficiency Rasheka is stable on Vit D, and she is due to have labs checked. She denies nausea, vomiting, or muscle weakness.  2. Other hyperlipidemia Liya is working on diet and weight loss. She is due to have labs checked.  3. Insulin resistance Charlyn is doing well decreasing simple carbohydrates in her diet, and she notes decreased polyphagia.  4. At risk for dehydration Jayona is at risk for dehydration with increased activity outside in the heat.  Assessment/Plan:   1. Vitamin D deficiency Low Vitamin D level contributes to fatigue and are associated with obesity, breast, and colon cancer. We will check labs today, and we will refill prescription Vitamin D for 1 month. Greenley will follow-up for routine testing of Vitamin D, at least 2-3 times per year to avoid over-replacement.  - Vitamin D, Ergocalciferol, (DRISDOL) 1.25 MG (50000 UNIT) CAPS capsule; Take 1 capsule  (50,000 Units total) by mouth every 7 (seven) days.  Dispense: 4 capsule; Refill: 0 - VITAMIN D 25 Hydroxy (Vit-D Deficiency, Fractures)  2. Other hyperlipidemia Cardiovascular risk and specific lipid/LDL goals reviewed. We discussed several lifestyle modifications today. We will check labs today. Georgeann will continue to work on diet, exercise and weight loss efforts. Orders and follow up as documented in patient record.   - Lipid Panel With LDL/HDL Ratio  3. Insulin resistance Juliyah will continue her Category 3 plan, and will continue to work on weight loss, exercise, and decreasing simple carbohydrates to help decrease the risk of diabetes. We will check labs today. Waleska agreed to follow-up with us as directed to closely monitor her progress.  - CMP14+EGFR - Insulin, random - Hemoglobin A1c  4. At risk for dehydration Ceira was given approximately 15 minutes dehydration prevention counseling today. Andriea is at risk for dehydration due to weight loss and current medication(s). She was encouraged to hydrate and monitor fluid status to avoid dehydration as well as weight loss plateaus.   5. Obesity with current BMI 40.6 Niki is currently in the action stage of change. As such, her goal is to continue with weight loss efforts. She has agreed to the Category 3 Plan and keeping a food journal and adhering to recommended goals of 400-600 calories and 40+ grams of protein at supper daily   Exercise goals: As is.  Behavioral modification strategies: increasing water intake and better snacking choices.  Emalyn has agreed to follow-up with our clinic in 3 weeks. She   was informed of the importance of frequent follow-up visits to maximize her success with intensive lifestyle modifications for her multiple health conditions.   Aliany was informed we would discuss her lab results at her next visit unless there is a critical issue that needs to be addressed sooner. Anniyah agreed to keep  her next visit at the agreed upon time to discuss these results.  Objective:   Blood pressure 130/70, pulse 60, temperature 97.8 F (36.6 C), height 5' 4" (1.626 m), weight 236 lb (107 kg), SpO2 99 %. Body mass index is 40.51 kg/m.  General: Cooperative, alert, well developed, in no acute distress. HEENT: Conjunctivae and lids unremarkable. Cardiovascular: Regular rhythm.  Lungs: Normal work of breathing. Neurologic: No focal deficits.   Lab Results  Component Value Date   CREATININE 0.73 06/16/2020   BUN 12 06/16/2020   NA 141 06/16/2020   K 4.6 06/16/2020   CL 101 06/16/2020   CO2 23 06/16/2020   Lab Results  Component Value Date   ALT 15 06/16/2020   AST 15 06/16/2020   ALKPHOS 150 (H) 06/16/2020   BILITOT 0.3 06/16/2020   Lab Results  Component Value Date   HGBA1C 5.5 06/16/2020   HGBA1C 4.9 03/24/2020   HGBA1C 5.7 (A) 12/22/2019   HGBA1C 5.4 03/24/2019   HGBA1C 5.2 09/17/2018   Lab Results  Component Value Date   INSULIN 8.1 06/16/2020   INSULIN 10.2 03/24/2020   Lab Results  Component Value Date   TSH 3.210 03/24/2020   Lab Results  Component Value Date   CHOL 183 06/16/2020   HDL 49 06/16/2020   LDLCALC 111 (H) 06/16/2020   TRIG 132 06/16/2020   CHOLHDL 3.3 03/24/2019   Lab Results  Component Value Date   WBC 6.0 01/19/2020   HGB 8.3 (L) 01/19/2020   HCT 25.0 (L) 01/19/2020   MCV 116.3 (H) 01/19/2020   PLT 136 (L) 01/19/2020   Lab Results  Component Value Date   IRON 85 11/25/2015   TIBC 345 11/25/2015   FERRITIN 89 11/25/2015   Attestation Statements:   Reviewed by clinician on day of visit: allergies, medications, problem list, medical history, surgical history, family history, social history, and previous encounter notes.   I, Sharon Martin, am acting as transcriptionist for Caren Beasley, MD.  I have reviewed the above documentation for accuracy and completeness, and I agree with the above. -  Caren Beasley, MD   

## 2020-07-01 ENCOUNTER — Telehealth: Payer: Self-pay | Admitting: Hematology and Oncology

## 2020-07-01 ENCOUNTER — Other Ambulatory Visit: Payer: Self-pay

## 2020-07-01 ENCOUNTER — Inpatient Hospital Stay: Payer: 59 | Attending: Hematology and Oncology | Admitting: Hematology and Oncology

## 2020-07-01 ENCOUNTER — Other Ambulatory Visit: Payer: 59

## 2020-07-01 ENCOUNTER — Encounter: Payer: Self-pay | Admitting: Hematology and Oncology

## 2020-07-01 ENCOUNTER — Inpatient Hospital Stay: Payer: 59

## 2020-07-01 DIAGNOSIS — M549 Dorsalgia, unspecified: Secondary | ICD-10-CM | POA: Insufficient documentation

## 2020-07-01 DIAGNOSIS — Z79899 Other long term (current) drug therapy: Secondary | ICD-10-CM | POA: Insufficient documentation

## 2020-07-01 DIAGNOSIS — G8929 Other chronic pain: Secondary | ICD-10-CM | POA: Diagnosis not present

## 2020-07-01 DIAGNOSIS — G620 Drug-induced polyneuropathy: Secondary | ICD-10-CM | POA: Diagnosis not present

## 2020-07-01 DIAGNOSIS — Z6841 Body Mass Index (BMI) 40.0 and over, adult: Secondary | ICD-10-CM | POA: Diagnosis not present

## 2020-07-01 DIAGNOSIS — Z86000 Personal history of in-situ neoplasm of breast: Secondary | ICD-10-CM | POA: Diagnosis not present

## 2020-07-01 DIAGNOSIS — C541 Malignant neoplasm of endometrium: Secondary | ICD-10-CM

## 2020-07-01 DIAGNOSIS — T451X5A Adverse effect of antineoplastic and immunosuppressive drugs, initial encounter: Secondary | ICD-10-CM

## 2020-07-01 LAB — CMP (CANCER CENTER ONLY)
ALT: 12 U/L (ref 0–44)
AST: 17 U/L (ref 15–41)
Albumin: 3.8 g/dL (ref 3.5–5.0)
Alkaline Phosphatase: 163 U/L — ABNORMAL HIGH (ref 38–126)
Anion gap: 9 (ref 5–15)
BUN: 18 mg/dL (ref 8–23)
CO2: 26 mmol/L (ref 22–32)
Calcium: 10 mg/dL (ref 8.9–10.3)
Chloride: 104 mmol/L (ref 98–111)
Creatinine: 0.75 mg/dL (ref 0.44–1.00)
GFR, Estimated: >60 mL/min (ref >60)
Glucose, Bld: 104 mg/dL — ABNORMAL HIGH (ref 70–99)
Potassium: 4.3 mmol/L (ref 3.5–5.1)
Sodium: 139 mmol/L (ref 135–145)
Total Bilirubin: 0.3 mg/dL (ref 0.3–1.2)
Total Protein: 7.4 g/dL (ref 6.5–8.1)

## 2020-07-01 LAB — CBC WITH DIFFERENTIAL (CANCER CENTER ONLY)
Abs Immature Granulocytes: 0.02 10*3/uL (ref 0.00–0.07)
Basophils Absolute: 0 10*3/uL (ref 0.0–0.1)
Basophils Relative: 1 %
Eosinophils Absolute: 0.1 10*3/uL (ref 0.0–0.5)
Eosinophils Relative: 1 %
HCT: 37.6 % (ref 36.0–46.0)
Hemoglobin: 12.6 g/dL (ref 12.0–15.0)
Immature Granulocytes: 0 %
Lymphocytes Relative: 21 %
Lymphs Abs: 1.9 10*3/uL (ref 0.7–4.0)
MCH: 32.7 pg (ref 26.0–34.0)
MCHC: 33.5 g/dL (ref 30.0–36.0)
MCV: 97.7 fL (ref 80.0–100.0)
Monocytes Absolute: 0.6 10*3/uL (ref 0.1–1.0)
Monocytes Relative: 7 %
Neutro Abs: 6.1 10*3/uL (ref 1.7–7.7)
Neutrophils Relative %: 70 %
Platelet Count: 259 10*3/uL (ref 150–400)
RBC: 3.85 MIL/uL — ABNORMAL LOW (ref 3.87–5.11)
RDW: 13 % (ref 11.5–15.5)
WBC Count: 8.7 10*3/uL (ref 4.0–10.5)
nRBC: 0 % (ref 0.0–0.2)

## 2020-07-01 MED ORDER — HEPARIN SOD (PORK) LOCK FLUSH 100 UNIT/ML IV SOLN
500.0000 [IU] | Freq: Once | INTRAVENOUS | Status: AC
  Administered 2020-07-01: 500 [IU]
  Filled 2020-07-01: qty 5

## 2020-07-01 MED ORDER — SODIUM CHLORIDE 0.9% FLUSH
10.0000 mL | Freq: Once | INTRAVENOUS | Status: AC
  Administered 2020-07-01: 10 mL
  Filled 2020-07-01: qty 10

## 2020-07-01 NOTE — Assessment & Plan Note (Signed)
Examination is benign

## 2020-07-01 NOTE — Telephone Encounter (Signed)
Scheduled appointment per 06/02 sch msg. Patient is aware.

## 2020-07-01 NOTE — Assessment & Plan Note (Signed)
She has very trace minimal residual neuropathy of which she rated at 1 out of 10 She takes gabapentin and Cymbalta for chronic back pain Her back pain is not worse With continuous weight loss, I am hopeful that she can come off these medications because those medications can hinder her weight loss effort

## 2020-07-01 NOTE — Patient Instructions (Signed)

## 2020-07-01 NOTE — Progress Notes (Signed)
[place] OFFICE PROGRESS NOTE  Patient Care Team: Hali Marry, MD as PCP - General (Family Medicine) Druscilla Brownie, MD (Dermatology) Kristeen Miss, MD (Neurosurgery) Guss Bunde, MD (Obstetrics and Gynecology) Awanda Mink Craige Cotta, RN as Oncology Nurse Navigator (Oncology)  ASSESSMENT & PLAN:  Endometrial cancer [facility]) She is doing very well She has successfully lost a lot of weight I continue to encourage her with weight loss effort Her examination is benign I do not plan routine surveillance imaging study in the absence of symptoms We will continue port maintenance with flushes every 8 weeks I will see her back in 6 months She will see GYN surgeon in 3 months  BMI 40.0-44.9, adult [facility]) She is doing very well and has lost a lot of weight I encouraged her to continue with her weight loss effort  History of ductal carcinoma in situ (DCIS) of breast Examination is benign  Peripheral neuropathy due to chemotherapy [facility]) She has very trace minimal residual neuropathy of which she rated at 1 out of 10 She takes gabapentin and Cymbalta for chronic back pain Her back pain is not worse With continuous weight loss, I am hopeful that she can come off these medications because those medications can hinder her weight loss effort   No orders of the defined types were placed in this encounter.   All questions were answered. The patient knows to call the clinic with any problems, questions or concerns. The total time spent in the appointment was 20 minutes encounter with patients including review of chart and various tests results, discussions about plan of care and coordination of care plan   [name], MD 07/01/2020 12:47 PM  INTERVAL HISTORY: Please see below for problem oriented charting. She returns for further follow-up with her wife She is doing very well She has successful weight loss since last time I saw her No recent changes in her bowel  habits, abdominal pain or nausea No abnormal chest wall lesions She has very minimal trace neuropathy from prior treatment  SUMMARY OF ONCOLOGIC HISTORY: Oncology History Overview Note  MSI Stable Negative genetics   Endometrial cancer ([place])  07/28/2019 Pathology Results   Endometrium, biopsy - ENDOMETRIOID CARCINOMA - SEE COMMENT Microscopic Comment Based on the biopsy, the carcinoma appears FIGO grade 1.   08/01/2019 Imaging   CT abdomen and pelvis No evidence of abdominal or pelvic metastatic disease.   Small uterine fibroid.   Colonic diverticulosis, without radiographic evidence of diverticulitis.   Aortic Atherosclerosis (ICD10-I70.0).   08/14/2019 Surgery   Surgeon: Donaciano Eva  Operation: Robotic-assisted laparoscopic total hysterectomy with bilateral salpingoophorectomy, SLN biopsy     Operative Findings:  : 8cm uterus which appeared grossly normal, normal appearing tubes and ovaries, no suspicious lymph nodes. Brisk oozing from skin incisions.    09/03/2019 Cancer Staging   Staging form: Corpus Uteri - Carcinoma and Carcinosarcoma, AJCC 8th Edition - Pathologic stage from 09/03/2019: FIGO Stage IIIA (pT3a, pN0, cM0) - Signed by [name], MD on 09/03/2019   09/04/2019 Pathology Results   FINAL MICROSCOPIC DIAGNOSIS:   A. LYMPH NODE, SENTINEL RIGHT OBTURATOR PROXIMAL, BIOPSY:  -  No carcinoma identified in one lymph node (0/1)  -  See comment   B. LYMPH NODE, SENTINEL RIGHT OBTURATOR DISTAL, BIOPSY:  -  No carcinoma identified in one lymph node (0/1)  -  See comment   C. LYMPH NODE, SENTINEL LEFT EXTERNAL ILIAC, BIOPSY:  -  No carcinoma identified in one lymph  node (0/1)  -  See comment   D. UTERUS AND BILATERAL ADNEXA, HYSTERECTOMY WITH SALPINGO-OOPHORECTOMY:   Uterus:  -  Endometrioid carcinoma, FIGO grade 1  -  Leiomyoma (1.8 cm; largest)  -  See oncology table and comment below   Cervix:  -  Cervix with squamous metaplasia  -  No carcinoma  identified   Bilateral Ovaries:  -  Endometrioid carcinoma involving right ovary  -  No carcinoma involving left ovary  -  Endosalpingiosis   Bilateral Fallopian tubes:  -  No carcinoma identified   ONCOLOGY TABLE:   UTERUS, CARCINOMA OR CARCINOSARCOMA   Procedure: Total hysterectomy and bilateral salpingo-oophorectomy  Histologic type: Endometrioid carcinoma, NOS  Histologic Grade: FIGO grade 1  Myometrial invasion:       Depth of invasion: 3 mm       Myometrial thickness: 20 mm  Uterine Serosa Involvement: Not identified  Cervical stromal involvement: Not identified  Extent of involvement of other organs: Right ovary  Lymphovascular invasion: Not identified  Regional Lymph Nodes:       Examined:      3 Sentinel                               0 non-sentinel                               3 total        Lymph nodes with metastasis: 0        Isolated tumor cells (<0.2 mm): 0        Micrometastasis:  (>0.2 mm and < 2.0 mm): 0        Macrometastasis: (>2.0 mm): 0        Extracapsular extension: N/A  Representative Tumor Block: D10  MMR / MSI testing: Pending  Pathologic Stage Classification (pTNM, AJCC 8th edition):  pT3a, pN0  Comments: Pancytokeratin performed on the lymph nodes is negative. The right ovary has a focus of carcinoma.     09/11/2019 Procedure   Placement of single lumen port a cath via right internal jugular vein. The catheter tip lies at the cavo-atrial junction. A power injectable port a cath was placed and is ready for immediate use.     09/12/2019 - 12/26/2019 Chemotherapy   The patient had carboplatin and taxol for chemotherapy treatment.     01/19/2020 Imaging   1. Interval total hysterectomy with bilateral salpingo oophorectomy. No findings to suggest recurrent or metastatic disease. 2. Tiny hiatal hernia. 3. Left colonic diverticulosis without diverticulitis. 4. Aortic Atherosclerosis (ICD10-I70.0).     REVIEW OF SYSTEMS:   Constitutional:  Denies fevers, chills or abnormal weight loss Eyes: Denies blurriness of vision Ears, nose, mouth, throat, and face: Denies mucositis or sore throat Respiratory: Denies cough, dyspnea or wheezes Cardiovascular: Denies palpitation, chest discomfort or lower extremity swelling Gastrointestinal:  Denies nausea, heartburn or change in bowel habits Skin: Denies abnormal skin rashes Lymphatics: Denies new lymphadenopathy or easy bruising Neurological:Denies numbness, tingling or new weaknesses Behavioral/Psych: Mood is stable, no new changes  All other systems were reviewed with the patient and are negative.  I have reviewed the past medical history, past surgical history, social history and family history with the patient and they are unchanged from previous note.  ALLERGIES:  has No Known Allergies.  MEDICATIONS:  Current Outpatient Medications  Medication Sig Dispense Refill  .  AMBULATORY NON FORMULARY MEDICATION Medication Name: CPAP with humidifier with 11 cm water pressure.  Dx OSA.  See attached Sleep Study.  Please fax to [facility]. 1 Units 0  . CALCIUM PO Take 1 tablet by mouth daily.    . DULoxetine (CYMBALTA) 60 MG capsule TAKE 1 CAPSULE BY MOUTH  DAILY 90 capsule 3  . fluticasone (FLONASE) 50 MCG/ACT nasal spray Place 2 sprays into both nostrils daily as needed for allergies or rhinitis.    Marland Kitchen gabapentin (NEURONTIN) 300 MG capsule TAKE 2 CAPSULES BY MOUTH  DAILY 180 capsule 3  . Krill Oil 500 MG CAPS Take 1 capsule by mouth daily.    Marland Kitchen lidocaine-prilocaine (EMLA) cream Apply 1 application topically as needed.    . Multiple Vitamins-Minerals (CENTRUM SILVER 50+WOMEN) TABS Take 1 capsule by mouth daily.    . Multiple Vitamins-Minerals (PRESERVISION AREDS) CAPS Take 1 capsule by mouth daily.    . polyethylene glycol (MIRALAX / GLYCOLAX) 17 g packet Take 17 g by mouth daily as needed.    Marland Kitchen rOPINIRole (REQUIP) 0.5 MG tablet TAKE 1 TABLET BY MOUTH AT  BEDTIME 90 tablet 3  . vitamin C  (ASCORBIC ACID) 500 MG tablet Take 500 mg by mouth daily.    . Vitamin D, Ergocalciferol, (DRISDOL) 1.25 MG (50000 UNIT) CAPS capsule Take 1 capsule (50,000 Units total) by mouth every 7 (seven) days. 4 capsule 0   No current facility-administered medications for this visit.    PHYSICAL EXAMINATION: ECOG PERFORMANCE STATUS: 0 - Asymptomatic  Vitals:   07/01/20 1137  BP: 127/66  Pulse: (!) 59  Resp: 18  Temp: 97.7 F (36.5 C)  SpO2: 100%   Filed Weights   07/01/20 1137  Weight: 235 lb 12.8 oz (107 kg)    GENERAL:alert, no distress and comfortable SKIN: skin color, texture, turgor are normal, no rashes or significant lesions EYES: normal, Conjunctiva are pink and non-injected, sclera clear OROPHARYNX:no exudate, no erythema and lips, buccal mucosa, and tongue normal  NECK: supple, thyroid normal size, non-tender, without nodularity LYMPH:  no palpable lymphadenopathy in the cervical, axillary or inguinal LUNGS: clear to auscultation and percussion with normal breathing effort HEART: regular rate & rhythm and no murmurs and no lower extremity edema ABDOMEN:abdomen soft, non-tender and normal bowel sounds Musculoskeletal:no cyanosis of digits and no clubbing  NEURO: alert & oriented x 3 with fluent speech, no focal motor/sensory deficits Bilateral chest wall examination reveals well-healed mastectomy scar  LABORATORY DATA:  I have reviewed the data as listed    Component Value Date/Time   NA 139 07/01/2020 1045   NA 141 06/16/2020 1305   NA 141 01/20/2014 1604   K 4.3 07/01/2020 1045   K 4.2 01/20/2014 1604   CL 104 07/01/2020 1045   CO2 26 07/01/2020 1045   CO2 28 01/20/2014 1604   GLUCOSE 104 (H) 07/01/2020 1045   GLUCOSE 99 01/20/2014 1604   BUN 18 07/01/2020 1045   BUN 12 06/16/2020 1305   BUN 14.6 01/20/2014 1604   CREATININE 0.75 07/01/2020 1045   CREATININE 0.78 03/24/2019 1041   CREATININE 0.8 01/20/2014 1604   CALCIUM 10.0 07/01/2020 1045   CALCIUM 9.8  01/20/2014 1604   PROT 7.4 07/01/2020 1045   PROT 7.1 06/16/2020 1305   PROT 7.3 01/20/2014 1604   ALBUMIN 3.8 07/01/2020 1045   ALBUMIN 4.7 06/16/2020 1305   ALBUMIN 4.0 01/20/2014 1604   AST 17 07/01/2020 1045   AST 22 01/20/2014 1604   ALT  12 07/01/2020 1045   ALT 20 01/20/2014 1604   ALKPHOS 163 (H) 07/01/2020 1045   ALKPHOS 134 01/20/2014 1604   BILITOT 0.3 07/01/2020 1045   BILITOT <0.20 01/20/2014 1604   GFRNONAA >60 07/01/2020 1045   GFRNONAA 81 03/24/2019 1041   GFRAA >60 10/24/2019 0737   GFRAA 94 03/24/2019 1041    No results found for: SPEP, UPEP  Lab Results  Component Value Date   WBC 8.7 07/01/2020   NEUTROABS 6.1 07/01/2020   HGB 12.6 07/01/2020   HCT 37.6 07/01/2020   MCV 97.7 07/01/2020   PLT 259 07/01/2020      Chemistry      Component Value Date/Time   NA 139 07/01/2020 1045   NA 141 06/16/2020 1305   NA 141 01/20/2014 1604   K 4.3 07/01/2020 1045   K 4.2 01/20/2014 1604   CL 104 07/01/2020 1045   CO2 26 07/01/2020 1045   CO2 28 01/20/2014 1604   BUN 18 07/01/2020 1045   BUN 12 06/16/2020 1305   BUN 14.6 01/20/2014 1604   CREATININE 0.75 07/01/2020 1045   CREATININE 0.78 03/24/2019 1041   CREATININE 0.8 01/20/2014 1604      Component Value Date/Time   CALCIUM 10.0 07/01/2020 1045   CALCIUM 9.8 01/20/2014 1604   ALKPHOS 163 (H) 07/01/2020 1045   ALKPHOS 134 01/20/2014 1604   AST 17 07/01/2020 1045   AST 22 01/20/2014 1604   ALT 12 07/01/2020 1045   ALT 20 01/20/2014 1604   BILITOT 0.3 07/01/2020 1045   BILITOT <0.20 01/20/2014 1604

## 2020-07-01 NOTE — Assessment & Plan Note (Signed)
She is doing very well She has successfully lost a lot of weight I continue to encourage her with weight loss effort Her examination is benign I do not plan routine surveillance imaging study in the absence of symptoms We will continue port maintenance with flushes every 8 weeks I will see her back in 6 months She will see GYN surgeon in 3 months

## 2020-07-01 NOTE — Assessment & Plan Note (Signed)
She is doing very well and has lost a lot of weight I encouraged her to continue with her weight loss effort

## 2020-07-14 ENCOUNTER — Other Ambulatory Visit: Payer: Self-pay

## 2020-07-14 ENCOUNTER — Encounter (INDEPENDENT_AMBULATORY_CARE_PROVIDER_SITE_OTHER): Payer: Self-pay | Admitting: Family Medicine

## 2020-07-14 ENCOUNTER — Ambulatory Visit (INDEPENDENT_AMBULATORY_CARE_PROVIDER_SITE_OTHER): Payer: 59 | Admitting: Family Medicine

## 2020-07-14 VITALS — BP 122/84 | HR 76 | Temp 97.8°F | Ht 64.0 in | Wt 229.0 lb

## 2020-07-14 DIAGNOSIS — Z6841 Body Mass Index (BMI) 40.0 and over, adult: Secondary | ICD-10-CM | POA: Diagnosis not present

## 2020-07-14 DIAGNOSIS — E66813 Obesity, class 3: Secondary | ICD-10-CM

## 2020-07-14 DIAGNOSIS — Z9189 Other specified personal risk factors, not elsewhere classified: Secondary | ICD-10-CM

## 2020-07-14 DIAGNOSIS — E559 Vitamin D deficiency, unspecified: Secondary | ICD-10-CM

## 2020-07-14 MED ORDER — VITAMIN D (ERGOCALCIFEROL) 1.25 MG (50000 UNIT) PO CAPS: 50000.0000 [IU] | ORAL_CAPSULE | ORAL | 0 refills | Status: DC

## 2020-07-19 NOTE — Progress Notes (Signed)
Chief Complaint:   OBESITY Patricia Waters is here to discuss her progress with her obesity treatment plan along with follow-up of her obesity related diagnoses. Patricia Waters is on the Category 3 Plan and keeping a food journal and adhering to recommended goals of 400-600 calories and 40+ grams of protein daily and states she is following her eating plan approximately 80% of the time. [name] states she is walking and gardening for 30 minutes 3 times per week.  Today's visit was #: 7 Starting weight: 258 lbs Starting date: 03/24/2020 Today's weight: 229 lbs Today's date: 07/14/2020 Total lbs lost to date: 29 Total lbs lost since last in-office visit: 7  Interim History: [name] continues to do well with weight loss. She has done more traveling but still has done very well with portion control and smarter choices. She plans doing more traveling next month but doesn't anticipate any big challenges.   Subjective:   1. Vitamin D deficiency Patricia Waters is stable on Vit D, and last level was at goal. She denies nausea, vomiting, or muscle weakness.  2. At risk for dehydration Patricia Waters is at risk for dehydration due to inadequate water intake.  Assessment/Plan:   1. Vitamin D deficiency Low Vitamin D level contributes to fatigue and are associated with obesity, breast, and colon cancer. We will refill prescription Vitamin D for 1 month. Patricia Waters will follow-up for routine testing of Vitamin D, at least 2-3 times per year to avoid over-replacement.  - Vitamin D, Ergocalciferol, (DRISDOL) 1.25 MG (50000 UNIT) CAPS capsule; Take 1 capsule (50,000 Units total) by mouth every 7 (seven) days.  Dispense: 4 capsule; Refill: 0  2. At risk for dehydration Patricia Waters was given approximately 15 minutes dehydration prevention counseling today. Patricia Waters is at risk for dehydration due to weight loss and current medication(s). She was encouraged to hydrate and monitor fluid status to avoid dehydration as well as weight loss  plateaus.   3. Obesity with current BMI 39.4 Anabel is currently in the action stage of change. As such, her goal is to continue with weight loss efforts. She has agreed to the Category 3 Plan and keeping a food journal and adhering to recommended goals of 400-600 calories and 40+ grams of protein at supper daily.   Exercise goals: As is.  Behavioral modification strategies: increasing water intake and travel eating strategies.  Patricia Waters has agreed to follow-up with our clinic in 3 to 4 weeks. She was informed of the importance of frequent follow-up visits to maximize her success with intensive lifestyle modifications for her multiple health conditions.   Objective:   Blood pressure 122/84, pulse 76, temperature 97.8 F (36.6 C), height 5\' 4"  (1.626 m), weight 229 lb (103.9 kg), SpO2 97 %. Body mass index is 39.31 kg/m.  General: Cooperative, alert, well developed, in no acute distress. HEENT: Conjunctivae and lids unremarkable. Cardiovascular: Regular rhythm.  Lungs: Normal work of breathing. Neurologic: No focal deficits.   Lab Results  Component Value Date   CREATININE 0.75 07/01/2020   BUN 18 07/01/2020   NA 139 07/01/2020   K 4.3 07/01/2020   CL 104 07/01/2020   CO2 26 07/01/2020   Lab Results  Component Value Date   ALT 12 07/01/2020   AST 17 07/01/2020   ALKPHOS 163 (H) 07/01/2020   BILITOT 0.3 07/01/2020   Lab Results  Component Value Date   HGBA1C 5.5 06/16/2020   HGBA1C 4.9 03/24/2020   HGBA1C 5.7 (A) 12/22/2019   HGBA1C 5.4 03/24/2019  HGBA1C 5.2 09/17/2018   Lab Results  Component Value Date   INSULIN 8.1 06/16/2020   INSULIN 10.2 03/24/2020   Lab Results  Component Value Date   TSH 3.210 03/24/2020   Lab Results  Component Value Date   CHOL 183 06/16/2020   HDL 49 06/16/2020   LDLCALC 111 (H) 06/16/2020   TRIG 132 06/16/2020   CHOLHDL 3.3 03/24/2019   Lab Results  Component Value Date   WBC 8.7 07/01/2020   HGB 12.6 07/01/2020   HCT  37.6 07/01/2020   MCV 97.7 07/01/2020   PLT 259 07/01/2020   Lab Results  Component Value Date   IRON 85 11/25/2015   TIBC 345 11/25/2015   FERRITIN 89 11/25/2015   Attestation Statements:   Reviewed by clinician on day of visit: allergies, medications, problem list, medical history, surgical history, family history, social history, and previous encounter notes.   I, Trixie Dredge, am acting as transcriptionist for [name], MD.  I have reviewed the above documentation for accuracy and completeness, and I agree with the above. -  [name], MD

## 2020-07-24 ENCOUNTER — Other Ambulatory Visit: Payer: Self-pay | Admitting: Family Medicine

## 2020-07-24 DIAGNOSIS — Z0000 Encounter for general adult medical examination without abnormal findings: Secondary | ICD-10-CM

## 2020-07-30 ENCOUNTER — Encounter: Payer: Self-pay | Admitting: Hematology and Oncology

## 2020-08-04 ENCOUNTER — Encounter (INDEPENDENT_AMBULATORY_CARE_PROVIDER_SITE_OTHER): Payer: Self-pay | Admitting: Family Medicine

## 2020-08-04 ENCOUNTER — Other Ambulatory Visit: Payer: Self-pay

## 2020-08-04 ENCOUNTER — Ambulatory Visit (INDEPENDENT_AMBULATORY_CARE_PROVIDER_SITE_OTHER): Payer: 59 | Admitting: Family Medicine

## 2020-08-04 VITALS — BP 125/89 | HR 65 | Temp 97.9°F | Ht 64.0 in | Wt 226.0 lb

## 2020-08-04 DIAGNOSIS — E7849 Other hyperlipidemia: Secondary | ICD-10-CM

## 2020-08-04 DIAGNOSIS — Z9189 Other specified personal risk factors, not elsewhere classified: Secondary | ICD-10-CM

## 2020-08-04 DIAGNOSIS — E559 Vitamin D deficiency, unspecified: Secondary | ICD-10-CM | POA: Diagnosis not present

## 2020-08-04 DIAGNOSIS — Z6841 Body Mass Index (BMI) 40.0 and over, adult: Secondary | ICD-10-CM

## 2020-08-04 MED ORDER — VITAMIN D (ERGOCALCIFEROL) 1.25 MG (50000 UNIT) PO CAPS
50000.0000 [IU] | ORAL_CAPSULE | ORAL | 0 refills | Status: DC
Start: 2020-08-04 — End: 2020-09-01

## 2020-08-11 NOTE — Progress Notes (Signed)
Chief Complaint:   OBESITY Patricia Waters is here to discuss her progress with her obesity treatment plan along with follow-up of her obesity related diagnoses. Patricia Waters is on the Category 3 Plan and keeping a food journal and adhering to recommended goals of 400-600 calories and 40+ grams of protein at supper daily and states she is following her eating plan approximately 80% of the time. Patricia Waters states she is walking and gardening for 30 minutes 3 times per week.  Today's visit was #: 8 Starting weight: 258 lbs Starting date: 03/24/2020 Today's weight: 226 lbs Today's date: 08/04/2020 Total lbs lost to date: 32 Total lbs lost since last in-office visit: 3  Interim History: Patricia Waters continues to do well with weight loss. She is active and her hunger is more controlled. She sometimes struggles to eat all of her protein.  Subjective:   1. Other hyperlipidemia Patricia Waters's LDL is above goal. She is not on a statin, but she continues to work on diet, exercise, and weight loss. She denies chest pain.  2. Vitamin D deficiency Patricia Waters is stable on Vit D, and she denies nausea, vomiting, or muscle weakness.  3. At risk for osteoporosis Patricia Waters is at higher risk of osteopenia and osteoporosis due to Vitamin D deficiency.   Assessment/Plan:   1. Other hyperlipidemia Cardiovascular risk and specific lipid/LDL goals reviewed. We discussed several lifestyle modifications today. Patricia Waters will continue to work on diet, exercise and weight loss efforts as is, and we will recheck labs in 2 months. Orders and follow up as documented in patient record.   2. Vitamin D deficiency Low Vitamin D level contributes to fatigue and are associated with obesity, breast, and colon cancer. We will refill prescription Vitamin D for 1 month. We will recheck labs in 2 months, and Patricia Waters will follow-up for routine testing of Vitamin D, at least 2-3 times per year to avoid over-replacement.  - Vitamin D, Ergocalciferol,  (DRISDOL) 1.25 MG (50000 UNIT) CAPS capsule; Take 1 capsule (50,000 Units total) by mouth every 7 (seven) days.  Dispense: 4 capsule; Refill: 0  3. At risk for osteoporosis Patricia Waters was given approximately 15 minutes of osteoporosis prevention counseling today. Patricia Waters is at risk for osteopenia and osteoporosis due to her Vitamin D deficiency. She was encouraged to take her Vitamin D and follow her higher calcium diet and increase strengthening exercise to help strengthen her bones and decrease her risk of osteopenia and osteoporosis.  Repetitive spaced learning was employed today to elicit superior memory formation and behavioral change.  4. Obesity with current BMI 38.9 Patricia Waters is currently in the action stage of change. As such, her goal is to continue with weight loss efforts. She has agreed to the Category 3 Plan.   Exercise goals: As is.   Behavioral modification strategies: increasing lean protein intake and increasing water intake.  Patricia Waters has agreed to follow-up with our clinic in 3 to 4 weeks. She was informed of the importance of frequent follow-up visits to maximize her success with intensive lifestyle modifications for her multiple health conditions.   Objective:   Blood pressure 125/89, pulse 65, temperature 97.9 F (36.6 C), height 5\' 4"  (1.626 m), weight 226 lb (102.5 kg), SpO2 97 %. Body mass index is 38.79 kg/m.  General: Cooperative, alert, well developed, in no acute distress. HEENT: Conjunctivae and lids unremarkable. Cardiovascular: Regular rhythm.  Lungs: Normal work of breathing. Neurologic: No focal deficits.   Lab Results  Component Value Date   CREATININE 0.75  07/01/2020   BUN 18 07/01/2020   NA 139 07/01/2020   K 4.3 07/01/2020   CL 104 07/01/2020   CO2 26 07/01/2020   Lab Results  Component Value Date   ALT 12 07/01/2020   AST 17 07/01/2020   ALKPHOS 163 (H) 07/01/2020   BILITOT 0.3 07/01/2020   Lab Results  Component Value Date   HGBA1C 5.5  06/16/2020   HGBA1C 4.9 03/24/2020   HGBA1C 5.7 (A) 12/22/2019   HGBA1C 5.4 03/24/2019   HGBA1C 5.2 09/17/2018   Lab Results  Component Value Date   INSULIN 8.1 06/16/2020   INSULIN 10.2 03/24/2020   Lab Results  Component Value Date   TSH 3.210 03/24/2020   Lab Results  Component Value Date   CHOL 183 06/16/2020   HDL 49 06/16/2020   LDLCALC 111 (H) 06/16/2020   TRIG 132 06/16/2020   CHOLHDL 3.3 03/24/2019   Lab Results  Component Value Date   VD25OH 68.6 06/16/2020   VD25OH 40.3 03/24/2020   VD25OH 63 12/08/2013   Lab Results  Component Value Date   WBC 8.7 07/01/2020   HGB 12.6 07/01/2020   HCT 37.6 07/01/2020   MCV 97.7 07/01/2020   PLT 259 07/01/2020   Lab Results  Component Value Date   IRON 85 11/25/2015   TIBC 345 11/25/2015   FERRITIN 89 11/25/2015   Attestation Statements:   Reviewed by clinician on day of visit: allergies, medications, problem list, medical history, surgical history, family history, social history, and previous encounter notes.   I, Trixie Dredge, am acting as transcriptionist for [name], MD.  I have reviewed the above documentation for accuracy and completeness, and I agree with the above. -  [name], MD

## 2020-08-25 ENCOUNTER — Other Ambulatory Visit: Payer: Self-pay

## 2020-08-25 ENCOUNTER — Inpatient Hospital Stay: Payer: 59 | Attending: Hematology and Oncology

## 2020-08-25 DIAGNOSIS — C541 Malignant neoplasm of endometrium: Secondary | ICD-10-CM | POA: Diagnosis present

## 2020-08-25 DIAGNOSIS — Z452 Encounter for adjustment and management of vascular access device: Secondary | ICD-10-CM | POA: Diagnosis present

## 2020-08-25 MED ORDER — HEPARIN SOD (PORK) LOCK FLUSH 100 UNIT/ML IV SOLN
500.0000 [IU] | Freq: Once | INTRAVENOUS | Status: AC
  Administered 2020-08-25: 500 [IU]
  Filled 2020-08-25: qty 5

## 2020-08-25 MED ORDER — SODIUM CHLORIDE 0.9% FLUSH
10.0000 mL | Freq: Once | INTRAVENOUS | Status: AC
  Administered 2020-08-25: 10 mL
  Filled 2020-08-25: qty 10

## 2020-09-01 ENCOUNTER — Ambulatory Visit (INDEPENDENT_AMBULATORY_CARE_PROVIDER_SITE_OTHER): Payer: 59 | Admitting: Family Medicine

## 2020-09-01 ENCOUNTER — Other Ambulatory Visit: Payer: Self-pay

## 2020-09-01 ENCOUNTER — Encounter (INDEPENDENT_AMBULATORY_CARE_PROVIDER_SITE_OTHER): Payer: Self-pay | Admitting: Family Medicine

## 2020-09-01 VITALS — BP 112/75 | HR 84 | Temp 98.2°F | Ht 64.0 in | Wt 218.0 lb

## 2020-09-01 DIAGNOSIS — Z6841 Body Mass Index (BMI) 40.0 and over, adult: Secondary | ICD-10-CM | POA: Diagnosis not present

## 2020-09-01 DIAGNOSIS — R7303 Prediabetes: Secondary | ICD-10-CM

## 2020-09-01 DIAGNOSIS — Z9189 Other specified personal risk factors, not elsewhere classified: Secondary | ICD-10-CM

## 2020-09-01 DIAGNOSIS — E559 Vitamin D deficiency, unspecified: Secondary | ICD-10-CM

## 2020-09-01 MED ORDER — VITAMIN D (ERGOCALCIFEROL) 1.25 MG (50000 UNIT) PO CAPS: 50000.0000 [IU] | ORAL_CAPSULE | ORAL | 0 refills | Status: DC

## 2020-09-01 NOTE — Patient Instructions (Signed)
Health Maintenance Due  Topic Date Due   Pneumococcal Vaccine 91-64 Years old (1 - PCV) Never done   INFLUENZA VACCINE  08/30/2020    Depression screen [facility] 2/9 03/24/2020 01/20/2019 01/20/2019  Decreased Interest 3 0 0  Down, Depressed, Hopeless 1 0 -  PHQ - 2 Score 4 0 0  Altered sleeping 3 1 -  Tired, decreased energy 3 1 -  Change in appetite 1 0 -  Feeling bad or failure about yourself  1 0 -  Trouble concentrating 0 0 -  Moving slowly or fidgety/restless 0 0 -  Suicidal thoughts 0 0 -  PHQ-9 Score 12 2 -  Difficult doing work/chores Not difficult at all Not difficult at all -  Some recent data might be hidden

## 2020-09-02 NOTE — Progress Notes (Signed)
Chief Complaint:   OBESITY Patricia Waters is here to discuss her progress with her obesity treatment plan along with follow-up of her obesity related diagnoses. Patricia Waters is on the Category 3 Plan and states she is following her eating plan approximately 75% of the time. Patricia Waters states she is walking for 30 minutes 3 times per week.  Today's visit was #: 9 Starting weight: 258 lbs Starting date: 03/24/2020 Today's weight: 218 lbs Today's date: 8/422022 Total lbs lost to date: 40 Total lbs lost since last in-office visit: 8  Interim History: Patricia Waters continues to do well with weight loss. She has been very busy dealing with her mother's estate and clearing out the house in addition to her normal exercise. Her hunger is controlled.  Subjective:   1. Pre-diabetes Patricia Waters continues to very well with weight loss on her plan. She notes decreased polyphagia and she feels well overall.  2. Vitamin D deficiency Patricia Waters is stable on Vit D, and she denies signs of nausea, vomiting, or muscle weakness.  3. At risk for dehydration Patricia Waters is at risk for dehydration due to inadequate water intake.  Assessment/Plan:   1. Pre-diabetes Patricia Waters will continue diet, exercise, weight loss, and decreasing simple carbohydrates to help decrease the risk of diabetes.   2. Vitamin D deficiency Low Vitamin D level contributes to fatigue and are associated with obesity, breast, and colon cancer. We will refill prescription Vitamin D for 1 month. Patricia Waters will follow-up for routine testing of Vitamin D, at least 2-3 times per year to avoid over-replacement.  - Vitamin D, Ergocalciferol, (DRISDOL) 1.25 MG (50000 UNIT) CAPS capsule; Take 1 capsule (50,000 Units total) by mouth every 7 (seven) days.  Dispense: 4 capsule; Refill: 0  3. At risk for dehydration Patricia Waters was given approximately 15 minutes dehydration prevention counseling today. Patricia Waters is at risk for dehydration due to weight loss and current  medication(s). She was encouraged to hydrate and monitor fluid status to avoid dehydration as well as weight loss plateaus.   4. Obesity with current BMI 37.5 Patricia Waters is currently in the action stage of change. As such, her goal is to continue with weight loss efforts. She has agreed to the Category 3 Plan.   Exercise goals: As is.  Behavioral modification strategies: increasing lean protein intake and increasing water intake.  Patricia Waters has agreed to follow-up with our clinic in 3 to 4 weeks. She was informed of the importance of frequent follow-up visits to maximize her success with intensive lifestyle modifications for her multiple health conditions.   Objective:   Blood pressure 112/75, pulse 84, temperature 98.2 F (36.8 C), height '5\' 4"'$  (1.626 m), weight 218 lb (98.9 kg), SpO2 97 %. Body mass index is 37.42 kg/m.  General: Cooperative, alert, well developed, in no acute distress. HEENT: Conjunctivae and lids unremarkable. Cardiovascular: Regular rhythm.  Lungs: Normal work of breathing. Neurologic: No focal deficits.   Lab Results  Component Value Date   CREATININE 0.75 07/01/2020   BUN 18 07/01/2020   NA 139 07/01/2020   K 4.3 07/01/2020   CL 104 07/01/2020   CO2 26 07/01/2020   Lab Results  Component Value Date   ALT 12 07/01/2020   AST 17 07/01/2020   ALKPHOS 163 (H) 07/01/2020   BILITOT 0.3 07/01/2020   Lab Results  Component Value Date   HGBA1C 5.5 06/16/2020   HGBA1C 4.9 03/24/2020   HGBA1C 5.7 (A) 12/22/2019   HGBA1C 5.4 03/24/2019   HGBA1C 5.2 09/17/2018  Lab Results  Component Value Date   INSULIN 8.1 06/16/2020   INSULIN 10.2 03/24/2020   Lab Results  Component Value Date   TSH 3.210 03/24/2020   Lab Results  Component Value Date   CHOL 183 06/16/2020   HDL 49 06/16/2020   LDLCALC 111 (H) 06/16/2020   TRIG 132 06/16/2020   CHOLHDL 3.3 03/24/2019   Lab Results  Component Value Date   VD25OH 68.6 06/16/2020   VD25OH 40.3 03/24/2020    VD25OH 63 12/08/2013   Lab Results  Component Value Date   WBC 8.7 07/01/2020   HGB 12.6 07/01/2020   HCT 37.6 07/01/2020   MCV 97.7 07/01/2020   PLT 259 07/01/2020   Lab Results  Component Value Date   IRON 85 11/25/2015   TIBC 345 11/25/2015   FERRITIN 89 11/25/2015   Attestation Statements:   Reviewed by clinician on day of visit: allergies, medications, problem list, medical history, surgical history, family history, social history, and previous encounter notes.   I, Trixie Dredge, am acting as transcriptionist for [name], MD.  I have reviewed the above documentation for accuracy and completeness, and I agree with the above. -  [name], MD

## 2020-09-28 ENCOUNTER — Other Ambulatory Visit: Payer: Self-pay

## 2020-09-28 ENCOUNTER — Encounter (INDEPENDENT_AMBULATORY_CARE_PROVIDER_SITE_OTHER): Payer: Self-pay | Admitting: Family Medicine

## 2020-09-28 ENCOUNTER — Ambulatory Visit (INDEPENDENT_AMBULATORY_CARE_PROVIDER_SITE_OTHER): Payer: 59 | Admitting: Family Medicine

## 2020-09-28 VITALS — BP 125/80 | HR 59 | Temp 98.0°F | Ht 64.0 in | Wt 217.0 lb

## 2020-09-28 DIAGNOSIS — E559 Vitamin D deficiency, unspecified: Secondary | ICD-10-CM | POA: Diagnosis not present

## 2020-09-28 DIAGNOSIS — F3289 Other specified depressive episodes: Secondary | ICD-10-CM | POA: Diagnosis not present

## 2020-09-28 DIAGNOSIS — Z6841 Body Mass Index (BMI) 40.0 and over, adult: Secondary | ICD-10-CM | POA: Diagnosis not present

## 2020-09-28 DIAGNOSIS — Z9189 Other specified personal risk factors, not elsewhere classified: Secondary | ICD-10-CM | POA: Diagnosis not present

## 2020-09-28 MED ORDER — VITAMIN D (ERGOCALCIFEROL) 1.25 MG (50000 UNIT) PO CAPS: 50000.0000 [IU] | ORAL_CAPSULE | ORAL | 0 refills | Status: DC

## 2020-09-29 NOTE — Progress Notes (Signed)
Chief Complaint:   OBESITY Patricia Waters is here to discuss her progress with her obesity treatment plan along with follow-up of her obesity related diagnoses. Patricia Waters is on the Category 3 Plan and states she is following her eating plan approximately 75% of the time. Patricia Waters states she is walking and doing yard work for 15 minutes 3 times per week.  Today's visit was #: 10 Starting weight: 258 lbs Starting date: 03/24/2020 Today's weight: 217 lbs Today's date: 09/28/2020 Total lbs lost to date: 41 Total lbs lost since last in-office visit: 1  Interim History: Patricia Waters continues to do well with weight loss. Her hunger is controlled but her mom moved in with her and they are cooking more for her. She is being active especially with yard work.  Subjective:   1. Vitamin D deficiency Patricia Waters continues to do well with Vit D.  2. Other depression Patricia Waters's mood is stable on Cymbalta. She has some increased stress with moving her mother in and moving her mother in-law into a long term care. She is aware that increased stress can lead to stress eating.  3. At risk for impaired metabolic function Patricia Waters is at increased risk for impaired metabolic function if protein decreases.  Assessment/Plan:   1. Vitamin D deficiency Low Vitamin D level contributes to fatigue and are associated with obesity, breast, and colon cancer. We will refill prescription Vitamin D for 1 month. Vanita will follow-up for routine testing of Vitamin D, at least 2-3 times per year to avoid over-replacement.  - Vitamin D, Ergocalciferol, (DRISDOL) 1.25 MG (50000 UNIT) CAPS capsule; Take 1 capsule (50,000 Units total) by mouth every 7 (seven) days.  Dispense: 4 capsule; Refill: 0  2. Other depression Emotional eating behaviors were discussed today to help Patricia Waters deal with her emotional/non-hunger eating behaviors. She was given damage control strategies. Orders and follow up as documented in patient record.   3. At  risk for impaired metabolic function Patricia Waters was given approximately 15 minutes of impaired  metabolic function prevention counseling today. We discussed intensive lifestyle modifications today with an emphasis on specific nutrition and exercise instructions and strategies.   Repetitive spaced learning was employed today to elicit superior memory formation and behavioral change.  4. Obesity with current BMI 37.2 Patricia Waters is currently in the action stage of change. As such, her goal is to continue with weight loss efforts. She has agreed to the Category 3 Plan.   Exercise goals: As is.  Behavioral modification strategies: increasing lean protein intake and decreasing simple carbohydrates.  Patricia Waters has agreed to follow-up with our clinic in 3 to 4 weeks. She was informed of the importance of frequent follow-up visits to maximize her success with intensive lifestyle modifications for her multiple health conditions.   Objective:   Blood pressure 125/80, pulse (!) 59, temperature 98 F (36.7 C), height '5\' 4"'$  (1.626 m), weight 217 lb (98.4 kg), SpO2 97 %. Body mass index is 37.25 kg/m.  General: Cooperative, alert, well developed, in no acute distress. HEENT: Conjunctivae and lids unremarkable. Cardiovascular: Regular rhythm.  Lungs: Normal work of breathing. Neurologic: No focal deficits.   Lab Results  Component Value Date   CREATININE 0.75 07/01/2020   BUN 18 07/01/2020   NA 139 07/01/2020   K 4.3 07/01/2020   CL 104 07/01/2020   CO2 26 07/01/2020   Lab Results  Component Value Date   ALT 12 07/01/2020   AST 17 07/01/2020   ALKPHOS 163 (H) 07/01/2020  BILITOT 0.3 07/01/2020   Lab Results  Component Value Date   HGBA1C 5.5 06/16/2020   HGBA1C 4.9 03/24/2020   HGBA1C 5.7 (A) 12/22/2019   HGBA1C 5.4 03/24/2019   HGBA1C 5.2 09/17/2018   Lab Results  Component Value Date   INSULIN 8.1 06/16/2020   INSULIN 10.2 03/24/2020   Lab Results  Component Value Date   TSH  3.210 03/24/2020   Lab Results  Component Value Date   CHOL 183 06/16/2020   HDL 49 06/16/2020   LDLCALC 111 (H) 06/16/2020   TRIG 132 06/16/2020   CHOLHDL 3.3 03/24/2019   Lab Results  Component Value Date   VD25OH 68.6 06/16/2020   VD25OH 40.3 03/24/2020   VD25OH 63 12/08/2013   Lab Results  Component Value Date   WBC 8.7 07/01/2020   HGB 12.6 07/01/2020   HCT 37.6 07/01/2020   MCV 97.7 07/01/2020   PLT 259 07/01/2020   Lab Results  Component Value Date   IRON 85 11/25/2015   TIBC 345 11/25/2015   FERRITIN 89 11/25/2015   Attestation Statements:   Reviewed by clinician on day of visit: allergies, medications, problem list, medical history, surgical history, family history, social history, and previous encounter notes.   I, Trixie Dredge, am acting as transcriptionist for [name], MD.  I have reviewed the above documentation for accuracy and completeness, and I agree with the above. -  [name], MD

## 2020-10-20 ENCOUNTER — Inpatient Hospital Stay: Payer: 59 | Attending: Hematology and Oncology

## 2020-10-20 ENCOUNTER — Other Ambulatory Visit: Payer: Self-pay

## 2020-10-20 DIAGNOSIS — C541 Malignant neoplasm of endometrium: Secondary | ICD-10-CM | POA: Diagnosis present

## 2020-10-20 DIAGNOSIS — Z452 Encounter for adjustment and management of vascular access device: Secondary | ICD-10-CM | POA: Insufficient documentation

## 2020-10-20 MED ORDER — HEPARIN SOD (PORK) LOCK FLUSH 100 UNIT/ML IV SOLN
500.0000 [IU] | Freq: Once | INTRAVENOUS | Status: AC
  Administered 2020-10-20: 500 [IU]

## 2020-10-20 MED ORDER — SODIUM CHLORIDE 0.9% FLUSH
10.0000 mL | Freq: Once | INTRAVENOUS | Status: AC
  Administered 2020-10-20: 10 mL

## 2020-10-21 ENCOUNTER — Ambulatory Visit (INDEPENDENT_AMBULATORY_CARE_PROVIDER_SITE_OTHER): Payer: 59 | Admitting: Family Medicine

## 2020-10-21 ENCOUNTER — Encounter (INDEPENDENT_AMBULATORY_CARE_PROVIDER_SITE_OTHER): Payer: Self-pay | Admitting: Family Medicine

## 2020-10-21 VITALS — BP 117/72 | HR 63 | Temp 98.5°F | Ht 64.0 in | Wt 215.0 lb

## 2020-10-21 DIAGNOSIS — Z6841 Body Mass Index (BMI) 40.0 and over, adult: Secondary | ICD-10-CM | POA: Diagnosis not present

## 2020-10-21 DIAGNOSIS — E559 Vitamin D deficiency, unspecified: Secondary | ICD-10-CM

## 2020-10-21 MED ORDER — VITAMIN D (ERGOCALCIFEROL) 1.25 MG (50000 UNIT) PO CAPS: 50000.0000 [IU] | ORAL_CAPSULE | ORAL | 0 refills | Status: DC

## 2020-10-21 NOTE — Progress Notes (Signed)
Chief Complaint:   OBESITY Patricia Waters is here to discuss her progress with her obesity treatment plan along with follow-up of her obesity related diagnoses. Patricia Waters is on the Category 3 Plan and states she is following her eating plan approximately 80% of the time. Patricia Waters states she is walking and gardening for 30 minutes 7 times per week.  Today's visit was #: 11 Starting weight: 258 lbs Starting date: 03/24/2020 Today's weight: 215 lbs Today's date: 10/21/2020 Total lbs lost to date: 43 Total lbs lost since last in-office visit: 2  Interim History: Patricia Waters continues to well with weight loss. She is getting reading to go on vacation, and she is open to discussing vacation eating strategies.  Subjective:   1. Vitamin D deficiency Patricia Waters is stable on Vit D, and she denies nausea or vomiting. Her level is not yet at goal.  Assessment/Plan:   1. Vitamin D deficiency Low Vitamin D level contributes to fatigue and are associated with obesity, breast, and colon cancer. We will refill prescription Vitamin D 50,000 IU every week for 1 month, and we will recheck labs in 1 month. Patricia Waters will follow-up for routine testing of Vitamin D, at least 2-3 times per year to avoid over-replacement.  - Vitamin D, Ergocalciferol, (DRISDOL) 1.25 MG (50000 UNIT) CAPS capsule; Take 1 capsule (50,000 Units total) by mouth every 7 (seven) days.  Dispense: 4 capsule; Refill: 0  2. Obesity with current BMI 37.0 Patricia Waters is currently in the action stage of change. As such, her goal is to continue with weight loss efforts. She has agreed to the Category 3 Plan.   We will recheck fasting labs at her next visit.  Exercise goals: As is.  Behavioral modification strategies: increasing lean protein intake and travel eating strategies.  Patricia Waters has agreed to follow-up with our clinic in 4 weeks. She was informed of the importance of frequent follow-up visits to maximize her success with intensive lifestyle  modifications for her multiple health conditions.   Objective:   Blood pressure 117/72, pulse 63, temperature 98.5 F (36.9 C), height 5\' 4"  (1.626 m), weight 215 lb (97.5 kg), SpO2 98 %. Body mass index is 36.9 kg/m.  General: Cooperative, alert, well developed, in no acute distress. HEENT: Conjunctivae and lids unremarkable. Cardiovascular: Regular rhythm.  Lungs: Normal work of breathing. Neurologic: No focal deficits.   Lab Results  Component Value Date   CREATININE 0.75 07/01/2020   BUN 18 07/01/2020   NA 139 07/01/2020   K 4.3 07/01/2020   CL 104 07/01/2020   CO2 26 07/01/2020   Lab Results  Component Value Date   ALT 12 07/01/2020   AST 17 07/01/2020   ALKPHOS 163 (H) 07/01/2020   BILITOT 0.3 07/01/2020   Lab Results  Component Value Date   HGBA1C 5.5 06/16/2020   HGBA1C 4.9 03/24/2020   HGBA1C 5.7 (A) 12/22/2019   HGBA1C 5.4 03/24/2019   HGBA1C 5.2 09/17/2018   Lab Results  Component Value Date   INSULIN 8.1 06/16/2020   INSULIN 10.2 03/24/2020   Lab Results  Component Value Date   TSH 3.210 03/24/2020   Lab Results  Component Value Date   CHOL 183 06/16/2020   HDL 49 06/16/2020   LDLCALC 111 (H) 06/16/2020   TRIG 132 06/16/2020   CHOLHDL 3.3 03/24/2019   Lab Results  Component Value Date   VD25OH 68.6 06/16/2020   VD25OH 40.3 03/24/2020   VD25OH 63 12/08/2013   Lab Results  Component Value  Date   WBC 8.7 07/01/2020   HGB 12.6 07/01/2020   HCT 37.6 07/01/2020   MCV 97.7 07/01/2020   PLT 259 07/01/2020   Lab Results  Component Value Date   IRON 85 11/25/2015   TIBC 345 11/25/2015   FERRITIN 89 11/25/2015   Attestation Statements:   Reviewed by clinician on day of visit: allergies, medications, problem list, medical history, surgical history, family history, social history, and previous encounter notes.  Time spent on visit including pre-visit chart review and post-visit care and charting was 30 minutes.    I, Trixie Dredge, am  acting as transcriptionist for [name], MD.  I have reviewed the above documentation for accuracy and completeness, and I agree with the above. -  [name], MD

## 2020-11-03 ENCOUNTER — Encounter: Payer: Self-pay | Admitting: Family Medicine

## 2020-11-03 ENCOUNTER — Ambulatory Visit: Payer: 59 | Admitting: Family Medicine

## 2020-11-03 VITALS — BP 119/62 | HR 68 | Temp 98.6°F | Ht 64.0 in | Wt 224.0 lb

## 2020-11-03 DIAGNOSIS — E7849 Other hyperlipidemia: Secondary | ICD-10-CM

## 2020-11-03 DIAGNOSIS — R7301 Impaired fasting glucose: Secondary | ICD-10-CM

## 2020-11-03 DIAGNOSIS — R7303 Prediabetes: Secondary | ICD-10-CM | POA: Diagnosis not present

## 2020-11-03 DIAGNOSIS — G2581 Restless legs syndrome: Secondary | ICD-10-CM

## 2020-11-03 DIAGNOSIS — G4733 Obstructive sleep apnea (adult) (pediatric): Secondary | ICD-10-CM | POA: Diagnosis not present

## 2020-11-03 DIAGNOSIS — Z6838 Body mass index (BMI) 38.0-38.9, adult: Secondary | ICD-10-CM

## 2020-11-03 NOTE — Progress Notes (Signed)
Pt reports that her partner tested positive for strep this morning.   She denies any fever,headache,earache,cough,congestion,sinus pain/pressure.

## 2020-11-03 NOTE — Assessment & Plan Note (Signed)
Last A1c looks phenomenal.  She is also been doing a fantastic job on weight loss working with Dr. Lindaann Slough.

## 2020-11-03 NOTE — Assessment & Plan Note (Signed)
Doing well with her new machine.

## 2020-11-03 NOTE — Assessment & Plan Note (Signed)
Fantastic job on weight loss her BMI is now down to 38.

## 2020-11-03 NOTE — Assessment & Plan Note (Signed)
Well-controlled on current regimen.  Follow-up in 6 to 12 months.

## 2020-11-03 NOTE — Progress Notes (Signed)
Established Patient Office Visit  Subjective:  Patient ID: Patricia Waters, female    DOB: [birth year]  Age: 65 y.o. MRN: [id number]  CC:  Chief Complaint  Patient presents with   Hypertension    HPI SAVITA RUNNER presents for   Impaired fasting glucose-no increased thirst or urination. No symptoms consistent with hypoglycemia.  Lab Results  Component Value Date   HGBA1C 5.5 06/16/2020    F/U RLS -   doing well on her medication.   F/U OSA - was finally able to ge ta new CPAP [employer].  Her LUna was having mechanical problems.  She is planning on downloading the app so she can better follow her results.  Her wife Kevin Fenton was diagnosed with strep throat today.  Jenny Reichmann herself has not had any symptoms.  No swollen glands fevers chills or feeling bad.  Past Medical History:  Diagnosis Date   Anemia    Arthritis    Back pain    Bilateral swelling of feet    Breast cancer (HCC)    at age 22   Constipation    Family history of breast cancer    Family history of colon cancer    Family history of prostate cancer in father    Family history of uterine cancer    History of radiation therapy    Joint pain    Lymph edema    right arm    Musculoskeletal pain    Other fatigue    PONV (postoperative nausea and vomiting)    Pre-diabetes    Shortness of breath on exertion    Sleep apnea    pt has nose pillows for cpap per pt - will bring DOS    Vertigo     Past Surgical History:  Procedure Laterality Date   BACK SURGERY     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     BILATERAL TOTAL MASTECTOMY WITH AXILLARY LYMPH NODE DISSECTION Bilateral 2005   BREAST LUMPECTOMY  1994   right   CERVICAL FUSION  1995   C5-C7   IR IMAGING GUIDED PORT INSERTION  09/11/2019   MASTECTOMY  2005   Bilat    ROBOTIC ASSISTED TOTAL HYSTERECTOMY WITH BILATERAL SALPINGO OOPHERECTOMY N/A 08/14/2019   Procedure: XI ROBOTIC ASSISTED TOTAL HYSTERECTOMY WITH BILATERAL SALPINGO  OOPHORECTOMY;  Surgeon: [name], MD;  Location: [facility];  Service: Gynecology;  Laterality: N/A;   SENTINEL NODE BIOPSY N/A 08/14/2019   Procedure: SENTINEL NODE BIOPSY;  Surgeon: [name], MD;  Location: [facility];  Service: Gynecology;  Laterality: N/A;   WISDOM TOOTH EXTRACTION Bilateral 2004    Family History  Problem Relation Age of Onset   Stroke Mother    Hyperlipidemia Mother    Skin cancer Mother    Breast cancer Mother 34       ? recurrence at 49   Endometrial cancer Mother 63   Hypertension Mother    Cancer Mother    Obesity Mother    Diabetes Father    Hyperlipidemia Father    Heart attack Father 20   Colon cancer Father 43   Prostate cancer Father 53       Gleason 9   Hypertension Father    Heart disease Father    Cancer Father    Sleep apnea Father    Alcoholism Father    Obesity Father    Diabetes Paternal Grandmother    Heart attack Paternal Grandmother  Rheum arthritis Paternal Grandmother    Hyperlipidemia Sister    Heart attack Paternal Grandfather    Heart attack Maternal Grandfather    Heart attack Maternal Grandmother    Skin cancer Maternal Grandmother    Rheum arthritis Maternal Grandmother    Breast cancer Sister 67   Lymphoma Maternal Aunt 65   Kidney cancer Maternal Aunt 60   Cervical cancer Cousin 51    Social History   Socioeconomic History   Marital status: Married    Spouse name: Chief Financial Officer   Number of children: 0   Years of education: Not on file   Highest education level: Not on file  Occupational History   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 Former Therapist, nutritional.    Social Determinants of Health   Financial Resource Strain: Not on file  Food Insecurity: Not on file  Transportation Needs: Not on file  Physical Activity: Not on file  Stress: Not on file  Social Connections: Not on file  Intimate Partner Violence: Not on file    Outpatient Medications Prior to Visit  Medication Sig Dispense Refill   [facility] Medication Name: CPAP with humidifier with 11 cm water pressure.  Dx OSA.  See attached Sleep Study.  Please fax to [facility]. 1 Units 0   CALCIUM PO Take 1 tablet by mouth daily.     DULoxetine (CYMBALTA) 60 MG capsule TAKE 1 CAPSULE BY MOUTH  DAILY 90 capsule 3   fluticasone (FLONASE) 50 MCG/ACT nasal spray Place 2 sprays into both nostrils daily as needed for allergies or rhinitis.     gabapentin (NEURONTIN) 300 MG capsule TAKE 2 CAPSULES BY MOUTH  DAILY 180 capsule 3   Krill Oil 500 MG CAPS Take 1 capsule by mouth daily.     lidocaine-prilocaine (EMLA) cream Apply 1 application topically as needed.     Multiple Vitamins-Minerals (CENTRUM SILVER 50+WOMEN) TABS Take 1 capsule by mouth daily.     Multiple Vitamins-Minerals (PRESERVISION AREDS) CAPS Take 1 capsule by mouth daily.     polyethylene glycol (MIRALAX / GLYCOLAX) 17 g packet Take 17 g by mouth daily as needed.     rOPINIRole (REQUIP) 0.5 MG tablet TAKE 1 TABLET BY MOUTH AT  BEDTIME 90 tablet 3   vitamin C (ASCORBIC ACID) 500 MG tablet Take 500 mg by mouth daily.     Vitamin D, Ergocalciferol, (DRISDOL) 1.25 MG (50000 UNIT) CAPS capsule Take 1 capsule (50,000 Units total) by mouth every 7 (seven) days. 4 capsule 0   No facility-administered medications prior to visit.    No Known Allergies  ROS Review of Systems    Objective:    Physical Exam Constitutional:      Appearance: She is well-developed.   HENT:     Head: Normocephalic and atraumatic.     Right Ear: Tympanic membrane, ear canal and external ear  normal.     Left Ear: Tympanic membrane, ear canal and external ear normal.     Nose: Nose normal.     Mouth/Throat:     Mouth: Mucous membranes are moist.     Pharynx: Oropharynx is clear. No oropharyngeal exudate or posterior oropharyngeal erythema.  Eyes:     Conjunctiva/sclera: Conjunctivae normal.     Pupils: Pupils are equal, round, and reactive to light.  Neck:     Thyroid: No thyromegaly.  Cardiovascular:     Rate and Rhythm: Normal rate and regular rhythm.     Heart sounds: Normal heart sounds.  Pulmonary:     Effort: Pulmonary effort is normal.     Breath sounds: Normal breath sounds. No wheezing.  Musculoskeletal:     Cervical back: Neck supple.  Lymphadenopathy:     Cervical: No cervical adenopathy.  Skin:    General: Skin is warm and dry.  Neurological:     Mental Status: She is alert and oriented to person, place, and time.    BP 119/62   Pulse 68   Temp 98.6 F (37 C)   Ht '5\' 4"'  (1.626 m)   Wt 224 lb (101.6 kg)   SpO2 98%   BMI 38.45 kg/m  Wt Readings from Last 3 Encounters:  11/03/20 224 lb (101.6 kg)  10/21/20 215 lb (97.5 kg)  09/28/20 217 lb (98.4 kg)     There are no preventive care reminders to display for this patient.  There are no preventive care reminders to display for this patient.  Lab Results  Component Value Date   TSH 3.210 03/24/2020   Lab Results  Component Value Date   WBC 8.7 07/01/2020   HGB 12.6 07/01/2020   HCT 37.6 07/01/2020   MCV 97.7 07/01/2020   PLT 259 07/01/2020   Lab Results  Component Value Date   NA 139 07/01/2020   K 4.3 07/01/2020   CHLORIDE 103 01/20/2014   CO2 26 07/01/2020   GLUCOSE 104 (H) 07/01/2020   BUN 18 07/01/2020   CREATININE 0.75 07/01/2020   BILITOT 0.3 07/01/2020   ALKPHOS 163 (H) 07/01/2020   AST 17 07/01/2020   ALT 12 07/01/2020   PROT 7.4 07/01/2020   ALBUMIN 3.8  07/01/2020   CALCIUM 10.0 07/01/2020   ANIONGAP 9 07/01/2020   EGFR 92 06/16/2020   Lab Results  Component Value Date   CHOL 183 06/16/2020   Lab Results  Component Value Date   HDL 49 06/16/2020   Lab Results  Component Value Date   LDLCALC 111 (H) 06/16/2020   Lab Results  Component Value Date   TRIG 132 06/16/2020   Lab Results  Component Value Date   CHOLHDL 3.3 03/24/2019   Lab Results  Component Value Date   HGBA1C 5.5 06/16/2020      Assessment & Plan:   Problem List Items Addressed This Visit       Respiratory   Sleep apnea, obstructive    Doing well with her new machine.        Endocrine   IFG (impaired fasting glucose)    Last A1c looks phenomenal.  She is also been doing a fantastic job on weight loss working with Dr. Lindaann Slough.        Other   RLS (restless legs syndrome)    Well-controlled on current regimen.  Follow-up in 6 to 12 months.      BMI 38.0-38.9,adult    Fantastic job on weight loss  her BMI is now down to 38.      Other Visit Diagnoses     Pre-diabetes    -  Primary   Other hyperlipidemia           Continue Cymbalta which also helps with chronic pain, neuropathy and mood.  No orders of the defined types were placed in this encounter.   Follow-up: Return in about 6 months (around 05/04/2021).   I spent 30 minutes on the day of the encounter to include pre-visit record review, face-to-face time with the patient and post visit ordering of test.   Beatrice Lecher, MD

## 2020-11-17 ENCOUNTER — Telehealth: Payer: Self-pay | Admitting: *Deleted

## 2020-11-17 ENCOUNTER — Other Ambulatory Visit: Payer: Self-pay

## 2020-11-17 ENCOUNTER — Encounter (INDEPENDENT_AMBULATORY_CARE_PROVIDER_SITE_OTHER): Payer: Self-pay | Admitting: Family Medicine

## 2020-11-17 ENCOUNTER — Ambulatory Visit (INDEPENDENT_AMBULATORY_CARE_PROVIDER_SITE_OTHER): Payer: 59 | Admitting: Family Medicine

## 2020-11-17 VITALS — BP 126/69 | HR 68 | Temp 98.0°F | Ht 64.0 in | Wt 217.0 lb

## 2020-11-17 DIAGNOSIS — R7303 Prediabetes: Secondary | ICD-10-CM

## 2020-11-17 DIAGNOSIS — Z9189 Other specified personal risk factors, not elsewhere classified: Secondary | ICD-10-CM

## 2020-11-17 DIAGNOSIS — E7849 Other hyperlipidemia: Secondary | ICD-10-CM

## 2020-11-17 DIAGNOSIS — E559 Vitamin D deficiency, unspecified: Secondary | ICD-10-CM | POA: Diagnosis not present

## 2020-11-17 DIAGNOSIS — Z6841 Body Mass Index (BMI) 40.0 and over, adult: Secondary | ICD-10-CM

## 2020-11-17 DIAGNOSIS — E66813 Obesity, class 3: Secondary | ICD-10-CM

## 2020-11-17 MED ORDER — VITAMIN D (ERGOCALCIFEROL) 1.25 MG (50000 UNIT) PO CAPS: 50000.0000 [IU] | ORAL_CAPSULE | ORAL | 0 refills | Status: DC

## 2020-11-17 NOTE — Progress Notes (Signed)
Chief Complaint:   OBESITY Patricia Waters is here to discuss her progress with her obesity treatment plan along with follow-up of her obesity related diagnoses. Patricia Waters is on the Category 3 Plan and states she is following her eating plan approximately 70% of the time. Patricia Waters states she is walking for 30 minutes 3 times per week.  Today's visit was #: 12 Starting weight: 258 lbs Starting date: 03/24/2020 Today's weight: 217 lbs Today's date: 11/17/2020 Total lbs lost to date: 41 Total lbs lost since last in-office visit: 0  Interim History: Patricia Waters did some celebration eating recently. She is working on getting back on track, but she anticipates extra challenges with her partner having foot surgery and will need extra care. She is especially concerned with making dinner.  Subjective:   1. Other hyperlipidemia Patricia Waters is working on diet and exercise. She denies chest pain, and she is due for labs.  2. Vitamin D deficiency Patricia Waters is on Vit D, and she is due for labs.  3. Pre-diabetes Patricia Waters is working on diet and weight loss. She is not on metformin and she is due to have labs checked.  4. At risk for impaired metabolic function Patricia Waters is at increased risk for impaired metabolic function if protein decreases.  Assessment/Plan:   1. Other hyperlipidemia Cardiovascular risk and specific lipid/LDL goals reviewed. We discussed several lifestyle modifications today. We will check CMP and FLP today. Patricia Waters will continue to work on diet, exercise and weight loss efforts. Orders and follow up as documented in patient record.   2. Vitamin D deficiency Low Vitamin D level contributes to fatigue and are associated with obesity, breast, and colon cancer. We will check Vit D level today. Patricia Waters will follow-up for routine testing of Vitamin D, at least 2-3 times per year to avoid over-replacement.  3. Pre-diabetes Patricia Waters will continue to work on weight loss, exercise, and decreasing  simple carbohydrates to help decrease the risk of diabetes. We will check insulin and A1c today.  4. At risk for impaired metabolic function Patricia Waters was given approximately 15 minutes of impaired  metabolic function prevention counseling today. We discussed intensive lifestyle modifications today with an emphasis on specific nutrition and exercise instructions and strategies.   Repetitive spaced learning was employed today to elicit superior memory formation and behavioral change.  5. Obesity with current BMI 37.2 Patricia Waters is currently in the action stage of change. As such, her goal is to continue with weight loss efforts. She has agreed to the Category 3 Plan and keeping a food journal and adhering to recommended goals of 400-550 calories and 40+ grams of protein at supper daily.   Exercise goals: As is.  Behavioral modification strategies: increasing lean protein intake.  Patricia Waters has agreed to follow-up with our clinic in 2 to 3 weeks. She was informed of the importance of frequent follow-up visits to maximize her success with intensive lifestyle modifications for her multiple health conditions.   Objective:   Blood pressure 126/69, pulse 68, temperature 98 F (36.7 C), height 5\' 4"  (1.626 m), weight 217 lb (98.4 kg), SpO2 96 %. Body mass index is 37.25 kg/m.  General: Cooperative, alert, well developed, in no acute distress. HEENT: Conjunctivae and lids unremarkable. Cardiovascular: Regular rhythm.  Lungs: Normal work of breathing. Neurologic: No focal deficits.   Lab Results  Component Value Date   CREATININE 0.75 07/01/2020   BUN 18 07/01/2020   NA 139 07/01/2020   K 4.3 07/01/2020   CL 104  07/01/2020   CO2 26 07/01/2020   Lab Results  Component Value Date   ALT 12 07/01/2020   AST 17 07/01/2020   ALKPHOS 163 (H) 07/01/2020   BILITOT 0.3 07/01/2020   Lab Results  Component Value Date   HGBA1C 5.5 06/16/2020   HGBA1C 4.9 03/24/2020   HGBA1C 5.7 (A) 12/22/2019    HGBA1C 5.4 03/24/2019   HGBA1C 5.2 09/17/2018   Lab Results  Component Value Date   INSULIN 8.1 06/16/2020   INSULIN 10.2 03/24/2020   Lab Results  Component Value Date   TSH 3.210 03/24/2020   Lab Results  Component Value Date   CHOL 183 06/16/2020   HDL 49 06/16/2020   LDLCALC 111 (H) 06/16/2020   TRIG 132 06/16/2020   CHOLHDL 3.3 03/24/2019   Lab Results  Component Value Date   VD25OH 68.6 06/16/2020   VD25OH 40.3 03/24/2020   VD25OH 63 12/08/2013   Lab Results  Component Value Date   WBC 8.7 07/01/2020   HGB 12.6 07/01/2020   HCT 37.6 07/01/2020   MCV 97.7 07/01/2020   PLT 259 07/01/2020   Lab Results  Component Value Date   IRON 85 11/25/2015   TIBC 345 11/25/2015   FERRITIN 89 11/25/2015   Attestation Statements:   Reviewed by clinician on day of visit: allergies, medications, problem list, medical history, surgical history, family history, social history, and previous encounter notes.   I, Trixie Dredge, am acting as transcriptionist for [name], MD.  I have reviewed the above documentation for accuracy and completeness, and I agree with the above. -  [name], MD

## 2020-11-17 NOTE — Telephone Encounter (Signed)
Returned the patient's call and scheduled a follow up appt with Dr Berline Lopes on 10/24

## 2020-11-18 ENCOUNTER — Encounter: Payer: Self-pay | Admitting: Hematology and Oncology

## 2020-11-18 LAB — LIPID PANEL WITH LDL/HDL RATIO
Cholesterol, Total: 208 mg/dL — ABNORMAL HIGH (ref 100–199)
HDL: 57 mg/dL (ref >39)
LDL Chol Calc (NIH): 130 mg/dL — ABNORMAL HIGH (ref 0–99)
LDL/HDL Ratio: 2.3 ratio (ref 0.0–3.2)
Triglycerides: 119 mg/dL (ref 0–149)
VLDL Cholesterol Cal: 21 mg/dL (ref 5–40)

## 2020-11-18 LAB — INSULIN, RANDOM: INSULIN: 10.5 u[IU]/mL (ref 2.6–24.9)

## 2020-11-18 LAB — HEMOGLOBIN A1C
Est. average glucose Bld gHb Est-mCnc: 114 mg/dL
Hgb A1c MFr Bld: 5.6 % (ref 4.8–5.6)

## 2020-11-18 LAB — COMPREHENSIVE METABOLIC PANEL
ALT: 14 IU/L (ref 0–32)
AST: 19 IU/L (ref 0–40)
Albumin/Globulin Ratio: 1.8 (ref 1.2–2.2)
Albumin: 4.6 g/dL (ref 3.8–4.8)
Alkaline Phosphatase: 157 IU/L — ABNORMAL HIGH (ref 44–121)
BUN/Creatinine Ratio: 21 (ref 12–28)
BUN: 15 mg/dL (ref 8–27)
Bilirubin Total: 0.3 mg/dL (ref 0.0–1.2)
CO2: 23 mmol/L (ref 20–29)
Calcium: 9.6 mg/dL (ref 8.7–10.3)
Chloride: 99 mmol/L (ref 96–106)
Creatinine, Ser: 0.73 mg/dL (ref 0.57–1.00)
Globulin, Total: 2.6 g/dL (ref 1.5–4.5)
Glucose: 102 mg/dL — ABNORMAL HIGH (ref 70–99)
Potassium: 4.5 mmol/L (ref 3.5–5.2)
Sodium: 141 mmol/L (ref 134–144)
Total Protein: 7.2 g/dL (ref 6.0–8.5)
eGFR: 92 mL/min/{1.73_m2} (ref >59)

## 2020-11-18 LAB — VITAMIN D 25 HYDROXY (VIT D DEFICIENCY, FRACTURES): Vit D, 25-Hydroxy: 64.2 ng/mL (ref 30.0–100.0)

## 2020-11-19 ENCOUNTER — Encounter: Payer: Self-pay | Admitting: Gynecologic Oncology

## 2020-11-22 ENCOUNTER — Other Ambulatory Visit: Payer: Self-pay

## 2020-11-22 ENCOUNTER — Encounter: Payer: Self-pay | Admitting: Gynecologic Oncology

## 2020-11-22 ENCOUNTER — Inpatient Hospital Stay: Payer: 59 | Attending: Hematology and Oncology | Admitting: Gynecologic Oncology

## 2020-11-22 VITALS — BP 128/65 | HR 64 | Temp 98.4°F | Resp 16 | Ht 64.0 in | Wt 223.0 lb

## 2020-11-22 DIAGNOSIS — Z8542 Personal history of malignant neoplasm of other parts of uterus: Secondary | ICD-10-CM | POA: Insufficient documentation

## 2020-11-22 DIAGNOSIS — Z9221 Personal history of antineoplastic chemotherapy: Secondary | ICD-10-CM | POA: Insufficient documentation

## 2020-11-22 DIAGNOSIS — Z90710 Acquired absence of both cervix and uterus: Secondary | ICD-10-CM | POA: Insufficient documentation

## 2020-11-22 DIAGNOSIS — Z90722 Acquired absence of ovaries, bilateral: Secondary | ICD-10-CM | POA: Insufficient documentation

## 2020-11-22 DIAGNOSIS — C541 Malignant neoplasm of endometrium: Secondary | ICD-10-CM

## 2020-11-22 NOTE — Patient Instructions (Signed)
It was a pleasure meeting you today.  I do not see or feel any evidence of cancer on your exam.  I will plan to see you back in 6 months, sometime in April.  Please call after the new year to schedule a visit with me as her schedule is not out that far yet.  As always, if you develop any new and concerning symptoms, such as vaginal bleeding, pelvic pain, or change to bowel function, please call to see me sooner.

## 2020-11-22 NOTE — Progress Notes (Signed)
Gynecologic Oncology Return Clinic Visit  11/22/20  Reason for Visit: surveillance visit in the setting of uterine cancer  Treatment History: Oncology History Overview Note  MSI Stable Negative genetics   Endometrial cancer ([place])  07/28/2019 Pathology Results   Endometrium, biopsy - ENDOMETRIOID CARCINOMA - SEE COMMENT Microscopic Comment Based on the biopsy, the carcinoma appears FIGO grade 1.   08/01/2019 Imaging   CT abdomen and pelvis No evidence of abdominal or pelvic metastatic disease.   Small uterine fibroid.   Colonic diverticulosis, without radiographic evidence of diverticulitis.   Aortic Atherosclerosis (ICD10-I70.0).   08/14/2019 Surgery   Surgeon: Patricia Waters  Operation: Robotic-assisted laparoscopic total hysterectomy with bilateral salpingoophorectomy, SLN biopsy     Operative Findings:  : 8cm uterus which appeared grossly normal, normal appearing tubes and ovaries, no suspicious lymph nodes. Brisk oozing from skin incisions.    09/03/2019 Cancer Staging   Staging form: Corpus Uteri - Carcinoma and Carcinosarcoma, AJCC 8th Edition - Pathologic stage from 09/03/2019: FIGO Stage IIIA (pT3a, pN0, cM0) - Signed by Patricia Lark, MD on 09/03/2019    09/04/2019 Pathology Results   FINAL MICROSCOPIC DIAGNOSIS:   A. LYMPH NODE, SENTINEL RIGHT OBTURATOR PROXIMAL, BIOPSY:  -  No carcinoma identified in one lymph node (0/1)  -  See comment   B. LYMPH NODE, SENTINEL RIGHT OBTURATOR DISTAL, BIOPSY:  -  No carcinoma identified in one lymph node (0/1)  -  See comment   C. LYMPH NODE, SENTINEL LEFT EXTERNAL ILIAC, BIOPSY:  -  No carcinoma identified in one lymph node (0/1)  -  See comment   D. UTERUS AND BILATERAL ADNEXA, HYSTERECTOMY WITH SALPINGO-OOPHORECTOMY:   Uterus:  -  Endometrioid carcinoma, FIGO grade 1  -  Leiomyoma (1.8 cm; largest)  -  See oncology table and comment below   Cervix:  -  Cervix with squamous metaplasia  -  No carcinoma identified    Bilateral Ovaries:  -  Endometrioid carcinoma involving right ovary  -  No carcinoma involving left ovary  -  Endosalpingiosis   Bilateral Fallopian tubes:  -  No carcinoma identified   ONCOLOGY TABLE:   UTERUS, CARCINOMA OR CARCINOSARCOMA   Procedure: Total hysterectomy and bilateral salpingo-oophorectomy  Histologic type: Endometrioid carcinoma, NOS  Histologic Grade: FIGO grade 1  Myometrial invasion:       Depth of invasion: 3 mm       Myometrial thickness: 20 mm  Uterine Serosa Involvement: Not identified  Cervical stromal involvement: Not identified  Extent of involvement of other organs: Right ovary  Lymphovascular invasion: Not identified  Regional Lymph Nodes:       Examined:      3 Sentinel                               0 non-sentinel                               3 total        Lymph nodes with metastasis: 0        Isolated tumor cells (<0.2 mm): 0        Micrometastasis:  (>0.2 mm and < 2.0 mm): 0        Macrometastasis: (>2.0 mm): 0        Extracapsular extension: N/A  Representative Tumor Block: D10  MMR / MSI testing: Pending  Pathologic Stage Classification (pTNM, AJCC 8th edition):  pT3a, pN0  Comments: Pancytokeratin performed on the lymph nodes is negative. The right ovary has a focus of carcinoma.     09/11/2019 Procedure   Placement of single lumen port a cath via right internal jugular vein. The catheter tip lies at the cavo-atrial junction. A power injectable port a cath was placed and is ready for immediate use.     09/12/2019 - 12/26/2019 Chemotherapy   The patient had carboplatin and taxol for chemotherapy treatment.     01/19/2020 Imaging   1. Interval total hysterectomy with bilateral salpingo oophorectomy. No findings to suggest recurrent or metastatic disease. 2. Tiny hiatal hernia. 3. Left colonic diverticulosis without diverticulitis. 4. Aortic Atherosclerosis (ICD10-I70.0).     Interval History: Last saw Dr. [name] on 6/2. Was  NED at that time.   Presents today with her wife.  Overall reports doing well.  She denies any vaginal bleeding or discharge.  She endorses a good appetite without nausea or emesis.  She reports regular bladder and bowel function.  She uses Senokot or MiraLAX as needed for constipation although has not needed to recently.  She continues to work with Dr. Leafy Ro at weight management.  She is lost almost 50 pounds since she started in February.    She and her wife traveled to [place] this summer as well as to [employer]. 65  Past Medical/Surgical History: Past Medical History:  Diagnosis Date   Anemia    Arthritis    Back pain    Bilateral swelling of feet    Breast cancer ([place])    at age 65   Constipation    Family history of breast cancer    Family history of colon cancer    Family history of prostate cancer in father    Family history of uterine cancer 64    History of radiation therapy    Joint pain    Lymph edema    right arm    Musculoskeletal pain    Other fatigue    PONV (postoperative nausea and vomiting)    Pre-diabetes    Shortness of breath on exertion    Sleep apnea    pt has nose pillows for cpap per pt - will bring DOS    Vertigo     Past Surgical History:  Procedure Laterality Date   BACK SURGERY     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     BILATERAL TOTAL MASTECTOMY WITH AXILLARY LYMPH NODE DISSECTION Bilateral 2005   BREAST LUMPECTOMY  1994   right   CERVICAL FUSION  1995   C5-C7   IR IMAGING GUIDED PORT INSERTION  09/11/2019   MASTECTOMY  2005   Bilat    ROBOTIC ASSISTED TOTAL HYSTERECTOMY WITH BILATERAL SALPINGO OOPHERECTOMY N/A 08/14/2019   Procedure: XI ROBOTIC ASSISTED TOTAL HYSTERECTOMY WITH BILATERAL SALPINGO OOPHORECTOMY;  Surgeon: Patricia Amber, MD;  Location: [facility];  Service: Gynecology;  Laterality: N/A;   SENTINEL NODE BIOPSY N/A 08/14/2019   Procedure: SENTINEL NODE BIOPSY;  Surgeon: Patricia Amber, MD;  Location: [facility];  Service:  Gynecology;  Laterality: N/A;   WISDOM TOOTH EXTRACTION Bilateral 2004    Family History  Problem Relation Age of Onset   Stroke Mother    Hyperlipidemia Mother    Skin cancer Mother    Breast cancer Mother 64       ? recurrence at 66   Endometrial cancer Mother 57   Hypertension  Mother    Cancer Mother    Obesity Mother    Diabetes Father    Hyperlipidemia Father    Heart attack Father 63   Colon cancer Father 7   Prostate cancer Father 55       Gleason 9   Hypertension Father    Heart disease Father    Cancer Father    Sleep apnea Father    Alcoholism Father    Obesity Father    Diabetes Paternal Grandmother    Heart attack Paternal Grandmother    Rheum arthritis Paternal Grandmother    Hyperlipidemia Sister    Heart attack Paternal Grandfather    Heart attack Maternal Grandfather    Heart attack Maternal Grandmother    Skin cancer Maternal Grandmother    Rheum arthritis Maternal Grandmother    Breast cancer Sister 9   Lymphoma Maternal Aunt 65   Kidney cancer Maternal Aunt 60   Cervical cancer Cousin 51    Social History   Socioeconomic History   Marital status: Married    Spouse name: Chief Financial Officer   Number of children: 0   Years of education: Not on file   Highest education level: Not on file  Occupational History   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[place]  Tobacco Use   Smoking status: Former    Types: Cigarettes    Quit date: 10/31/2006    Years since quitting: 14.0   Smokeless tobacco: Never  Vaping Use   Vaping Use: Never used  Substance and Sexual Activity   Alcohol use: Not Currently   Drug use: No   Sexual activity: Not Currently    Partners: Female    Birth control/protection: None  Other Topics Concern   Not on file  Social History Narrative   Lives with her partner, [name]. Former Therapist, nutritional.    Social Determinants of Health   Financial Resource Strain: Not on file  Food  Insecurity: Not on file  Transportation Needs: Not on file  Physical Activity: Not on file  Stress: Not on file  Social Connections: Not on file    Current Medications:  Current Outpatient Medications:    AMBULATORY NON FORMULARY MEDICATION, Medication Name: CPAP with humidifier with 11 cm water pressure.  Dx OSA.  See attached Sleep Study.  Please fax to [facility]., Disp: 1 Units, Rfl: 0   CALCIUM PO, Take 1 tablet by mouth daily., Disp: , Rfl:    DULoxetine (CYMBALTA) 60 MG capsule, TAKE 1 CAPSULE BY MOUTH  DAILY, Disp: 90 capsule, Rfl: 3   fluticasone (FLONASE) 50 MCG/ACT nasal spray, Place 2 sprays into both nostrils daily as needed for allergies or rhinitis., Disp: , Rfl:    gabapentin (NEURONTIN) 300 MG capsule, TAKE 2 CAPSULES BY MOUTH  DAILY, Disp: 180 capsule, Rfl: 3   Krill Oil 500 MG CAPS, Take 1 capsule by mouth daily., Disp: , Rfl:    lidocaine-prilocaine (EMLA) cream, Apply 1 application topically as needed., Disp: , Rfl:    Multiple Vitamins-Minerals (CENTRUM SILVER 50+WOMEN) TABS, Take 1 capsule by mouth daily., Disp: , Rfl:    Multiple Vitamins-Minerals (PRESERVISION AREDS) CAPS, Take 1 capsule by mouth daily., Disp: , Rfl:    rOPINIRole (REQUIP) 0.5 MG tablet, TAKE 1 TABLET BY MOUTH AT  BEDTIME, Disp: 90 tablet, Rfl: 3   vitamin C (ASCORBIC ACID) 500 MG tablet, Take 500 mg by mouth daily., Disp: , Rfl:  Vitamin D, Ergocalciferol, (DRISDOL) 1.25 MG (50000 UNIT) CAPS capsule, Take 50,000 Units by mouth every 7 (seven) days., Disp: , Rfl:    polyethylene glycol (MIRALAX / GLYCOLAX) 17 g packet, Take 17 g by mouth daily as needed. (Patient not taking: Reported on 11/19/2020), Disp: , Rfl:   Review of Systems: Denies appetite changes, fevers, chills, fatigue, unexplained weight changes. Denies hearing loss, neck lumps or masses, mouth sores, ringing in ears or voice changes. Denies cough or wheezing.  Denies shortness of breath. Denies chest pain or palpitations. Denies  leg swelling. Denies abdominal distention, pain, blood in stools, constipation, diarrhea, nausea, vomiting, or early satiety. Denies pain with intercourse, dysuria, frequency, hematuria or incontinence. Denies hot flashes, pelvic pain, vaginal bleeding or vaginal discharge.   Denies joint pain, back pain or muscle pain/cramps. Denies itching, rash, or wounds. Denies dizziness, headaches, numbness or seizures. Denies swollen lymph nodes or glands, denies easy bruising or bleeding. Denies anxiety, depression, confusion, or decreased concentration.  Physical Exam: BP 128/65 (BP Location: Left Arm, Patient Position: Sitting)   Pulse 64   Temp 98.4 F (36.9 C) (Oral)   Resp 16   Ht _0  (1.626 m)   Wt 223 lb (101.2 kg)   SpO2 100%   BMI 38.28 kg/m  General: Alert, oriented, no acute distress. HEENT: Normocephalic, atraumatic, sclera anicteric. Chest: Clear to auscultation bilaterally.  No wheezes or rhonchi. Cardiovascular: Regular rate and rhythm, no murmurs. Abdomen: Obese, soft, nontender.  Normoactive bowel sounds.  No masses or hepatosplenomegaly appreciated.  Well-healed incisions. Extremities: Grossly normal range of motion.  Warm, well perfused.  No edema bilaterally. Skin: No rashes or lesions noted. Lymphatics: No cervical, supraclavicular, or inguinal adenopathy. GU: Normal appearing external genitalia without erythema, excoriation, or lesions.  Speculum exam reveals mildly atrophic vaginal mucosa, no lesions or masses.  Cuff intact, no bleeding or discharge.  Bimanual exam reveals cuff intact, no nodularity.  Rectovaginal exam confirms these findings.  Laboratory & Radiologic Studies: None new  Assessment & Plan: Patricia Waters is a 65 y.o. 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  The patient is overall doing quite well and is NED on exam today.  We reviewed  signs and symptoms that would be concerning for disease recurrence and the patient knows to call if she develops any of these.  We discussed that in the setting of her cancer, no routine surveillance imaging is recommended.  We will get imaging if she develops symptoms.  Additionally, given her history, no routine Pap tests are indicated.  Patient is scheduled to see Dr. [name] in December.  We will continue to alternate visits every 3 months.  Patient mentioned her alkaline phosphatase, which continues to be high although as less than it was 4 months ago.  She will follow-up with her primary care provider regarding this result.  32 minutes of total time was spent for this patient encounter, including preparation, face-to-face counseling with the patient and coordination of care, and documentation of the encounter.  Jeral Pinch, MD  Division of Gynecologic Oncology  Department of Obstetrics and Gynecology  [facility]

## 2020-12-13 ENCOUNTER — Encounter (INDEPENDENT_AMBULATORY_CARE_PROVIDER_SITE_OTHER): Payer: Self-pay | Admitting: Family Medicine

## 2020-12-13 ENCOUNTER — Inpatient Hospital Stay: Payer: 59 | Attending: Hematology and Oncology

## 2020-12-13 ENCOUNTER — Other Ambulatory Visit: Payer: Self-pay

## 2020-12-13 ENCOUNTER — Ambulatory Visit (INDEPENDENT_AMBULATORY_CARE_PROVIDER_SITE_OTHER): Payer: 59 | Admitting: Family Medicine

## 2020-12-13 VITALS — BP 128/84 | HR 65 | Temp 98.3°F | Ht 64.0 in | Wt 220.0 lb

## 2020-12-13 DIAGNOSIS — Z452 Encounter for adjustment and management of vascular access device: Secondary | ICD-10-CM | POA: Diagnosis not present

## 2020-12-13 DIAGNOSIS — Z6841 Body Mass Index (BMI) 40.0 and over, adult: Secondary | ICD-10-CM | POA: Diagnosis not present

## 2020-12-13 DIAGNOSIS — Z9189 Other specified personal risk factors, not elsewhere classified: Secondary | ICD-10-CM

## 2020-12-13 DIAGNOSIS — C541 Malignant neoplasm of endometrium: Secondary | ICD-10-CM | POA: Diagnosis present

## 2020-12-13 DIAGNOSIS — E559 Vitamin D deficiency, unspecified: Secondary | ICD-10-CM

## 2020-12-13 MED ORDER — HEPARIN SOD (PORK) LOCK FLUSH 100 UNIT/ML IV SOLN
500.0000 [IU] | Freq: Once | INTRAVENOUS | Status: AC
  Administered 2020-12-13: 500 [IU]

## 2020-12-13 MED ORDER — SODIUM CHLORIDE 0.9% FLUSH
10.0000 mL | Freq: Once | INTRAVENOUS | Status: AC
  Administered 2020-12-13: 10 mL

## 2020-12-13 MED ORDER — VITAMIN D (ERGOCALCIFEROL) 1.25 MG (50000 UNIT) PO CAPS: 50000.0000 [IU] | ORAL_CAPSULE | ORAL | 0 refills | Status: DC

## 2020-12-13 NOTE — Progress Notes (Signed)
Chief Complaint:   OBESITY Patricia Waters is here to discuss her progress with her obesity treatment plan along with follow-up of her obesity related diagnoses. Patricia Waters is on the Category 3 Plan and keeping a food journal and adhering to recommended goals of 400-550 calories and 40+ grams of protein at supper daily and states she is following her eating plan approximately 60% of the time. Patricia Waters states she is walking for 30 minutes 3 times per week.  Today's visit was #: 66 Starting weight: 258 lbs Starting date: 03/24/2020 Today's weight: 220 lbs Today's date: 12/13/2020 Total lbs lost to date: 38 Total lbs lost since last in-office visit: 0  Interim History: Patricia Waters is retaining some fluid today. She has had extra temptations and increased simple carbohydrates. She is taking care of her wife who will be off her feet for approximately 6 weeks.  Subjective:   1. Vitamin D deficiency Ki is stable, but her level is not yet at goal. She denies nausea, vomiting, or muscle weakness.  2. At risk for heart disease Patricia Waters is at a higher than average risk for cardiovascular disease due to obesity.   Assessment/Plan:   1. Vitamin D deficiency Low Vitamin D level contributes to fatigue and are associated with obesity, breast, and colon cancer. We will refill prescription Vitamin D for 1 month. Patricia Waters will follow-up for routine testing of Vitamin D, at least 2-3 times per year to avoid over-replacement.  - Vitamin D, Ergocalciferol, (DRISDOL) 1.25 MG (50000 UNIT) CAPS capsule; Take 1 capsule (50,000 Units total) by mouth every 7 (seven) days.  Dispense: 4 capsule; Refill: 0  2. At risk for heart disease Patricia Waters was given approximately 15 minutes of coronary artery disease prevention counseling today. She is 65 y.o. female and has risk factors for heart disease including obesity. We discussed intensive lifestyle modifications today with an emphasis on specific weight loss instructions and  strategies.   Repetitive spaced learning was employed today to elicit superior memory formation and behavioral change.  3. Obesity with current BMI 37.9 Patricia Waters is currently in the action stage of change. As such, her goal is to continue with weight loss efforts. She has agreed to the Category 3 Plan.   Exercise goals: As is.  Behavioral modification strategies: increasing lean protein intake.  Patricia Waters has agreed to follow-up with our clinic in 4 weeks. She was informed of the importance of frequent follow-up visits to maximize her success with intensive lifestyle modifications for her multiple health conditions.   Objective:   Blood pressure 128/84, pulse 65, temperature 98.3 F (36.8 C), height 5\' 4"  (1.626 m), weight 220 lb (99.8 kg), SpO2 97 %. Body mass index is 37.76 kg/m.  General: Cooperative, alert, well developed, in no acute distress. HEENT: Conjunctivae and lids unremarkable. Cardiovascular: Regular rhythm.  Lungs: Normal work of breathing. Neurologic: No focal deficits.   Lab Results  Component Value Date   CREATININE 0.73 11/17/2020   BUN 15 11/17/2020   NA 141 11/17/2020   K 4.5 11/17/2020   CL 99 11/17/2020   CO2 23 11/17/2020   Lab Results  Component Value Date   ALT 14 11/17/2020   AST 19 11/17/2020   ALKPHOS 157 (H) 11/17/2020   BILITOT 0.3 11/17/2020   Lab Results  Component Value Date   HGBA1C 5.6 11/17/2020   HGBA1C 5.5 06/16/2020   HGBA1C 4.9 03/24/2020   HGBA1C 5.7 (A) 12/22/2019   HGBA1C 5.4 03/24/2019   Lab Results  Component Value  Date   INSULIN 10.5 11/17/2020   INSULIN 8.1 06/16/2020   INSULIN 10.2 03/24/2020   Lab Results  Component Value Date   TSH 3.210 03/24/2020   Lab Results  Component Value Date   CHOL 208 (H) 11/17/2020   HDL 57 11/17/2020   LDLCALC 130 (H) 11/17/2020   TRIG 119 11/17/2020   CHOLHDL 3.3 03/24/2019   Lab Results  Component Value Date   VD25OH 64.2 11/17/2020   VD25OH 68.6 06/16/2020   VD25OH  40.3 03/24/2020   Lab Results  Component Value Date   WBC 8.7 07/01/2020   HGB 12.6 07/01/2020   HCT 37.6 07/01/2020   MCV 97.7 07/01/2020   PLT 259 07/01/2020   Lab Results  Component Value Date   IRON 85 11/25/2015   TIBC 345 11/25/2015   FERRITIN 89 11/25/2015   Attestation Statements:   Reviewed by clinician on day of visit: allergies, medications, problem list, medical history, surgical history, family history, social history, and previous encounter notes.   I, Trixie Dredge, am acting as transcriptionist for [name], MD.  I have reviewed the above documentation for accuracy and completeness, and I agree with the above. -  [name], MD

## 2021-01-11 ENCOUNTER — Ambulatory Visit (INDEPENDENT_AMBULATORY_CARE_PROVIDER_SITE_OTHER): Payer: 59 | Admitting: Family Medicine

## 2021-01-27 ENCOUNTER — Encounter: Payer: Self-pay | Admitting: Hematology and Oncology

## 2021-01-27 ENCOUNTER — Inpatient Hospital Stay: Payer: 59 | Attending: Hematology and Oncology

## 2021-01-27 ENCOUNTER — Other Ambulatory Visit: Payer: Self-pay

## 2021-01-27 ENCOUNTER — Inpatient Hospital Stay (HOSPITAL_BASED_OUTPATIENT_CLINIC_OR_DEPARTMENT_OTHER): Payer: 59 | Admitting: Hematology and Oncology

## 2021-01-27 VITALS — BP 126/101 | HR 107 | Temp 97.4°F | Resp 18 | Ht 64.0 in | Wt 218.4 lb

## 2021-01-27 DIAGNOSIS — G620 Drug-induced polyneuropathy: Secondary | ICD-10-CM | POA: Diagnosis not present

## 2021-01-27 DIAGNOSIS — Z79899 Other long term (current) drug therapy: Secondary | ICD-10-CM | POA: Diagnosis not present

## 2021-01-27 DIAGNOSIS — Z86000 Personal history of in-situ neoplasm of breast: Secondary | ICD-10-CM | POA: Diagnosis not present

## 2021-01-27 DIAGNOSIS — C541 Malignant neoplasm of endometrium: Secondary | ICD-10-CM | POA: Insufficient documentation

## 2021-01-27 DIAGNOSIS — T451X5A Adverse effect of antineoplastic and immunosuppressive drugs, initial encounter: Secondary | ICD-10-CM

## 2021-01-27 DIAGNOSIS — Z9013 Acquired absence of bilateral breasts and nipples: Secondary | ICD-10-CM | POA: Insufficient documentation

## 2021-01-27 DIAGNOSIS — E669 Obesity, unspecified: Secondary | ICD-10-CM | POA: Insufficient documentation

## 2021-01-27 DIAGNOSIS — Z6839 Body mass index (BMI) 39.0-39.9, adult: Secondary | ICD-10-CM | POA: Insufficient documentation

## 2021-01-27 LAB — CBC WITH DIFFERENTIAL (CANCER CENTER ONLY)
Abs Immature Granulocytes: 0.02 10*3/uL (ref 0.00–0.07)
Basophils Absolute: 0 10*3/uL (ref 0.0–0.1)
Basophils Relative: 1 %
Eosinophils Absolute: 0.1 10*3/uL (ref 0.0–0.5)
Eosinophils Relative: 1 %
HCT: 39.3 % (ref 36.0–46.0)
Hemoglobin: 13.1 g/dL (ref 12.0–15.0)
Immature Granulocytes: 0 %
Lymphocytes Relative: 27 %
Lymphs Abs: 2 10*3/uL (ref 0.7–4.0)
MCH: 32.3 pg (ref 26.0–34.0)
MCHC: 33.3 g/dL (ref 30.0–36.0)
MCV: 96.8 fL (ref 80.0–100.0)
Monocytes Absolute: 0.5 10*3/uL (ref 0.1–1.0)
Monocytes Relative: 6 %
Neutro Abs: 4.9 10*3/uL (ref 1.7–7.7)
Neutrophils Relative %: 65 %
Platelet Count: 235 10*3/uL (ref 150–400)
RBC: 4.06 MIL/uL (ref 3.87–5.11)
RDW: 12.2 % (ref 11.5–15.5)
WBC Count: 7.5 10*3/uL (ref 4.0–10.5)
nRBC: 0 % (ref 0.0–0.2)

## 2021-01-27 LAB — CMP (CANCER CENTER ONLY)
ALT: 12 U/L (ref 0–44)
AST: 16 U/L (ref 15–41)
Albumin: 4.2 g/dL (ref 3.5–5.0)
Alkaline Phosphatase: 106 U/L (ref 38–126)
Anion gap: 5 (ref 5–15)
BUN: 16 mg/dL (ref 8–23)
CO2: 29 mmol/L (ref 22–32)
Calcium: 9.4 mg/dL (ref 8.9–10.3)
Chloride: 104 mmol/L (ref 98–111)
Creatinine: 0.71 mg/dL (ref 0.44–1.00)
GFR, Estimated: >60 mL/min (ref >60)
Glucose, Bld: 101 mg/dL — ABNORMAL HIGH (ref 70–99)
Potassium: 4.2 mmol/L (ref 3.5–5.1)
Sodium: 138 mmol/L (ref 135–145)
Total Bilirubin: 0.4 mg/dL (ref 0.3–1.2)
Total Protein: 7.3 g/dL (ref 6.5–8.1)

## 2021-01-27 MED ORDER — HEPARIN SOD (PORK) LOCK FLUSH 100 UNIT/ML IV SOLN
500.0000 [IU] | Freq: Once | INTRAVENOUS | Status: AC
  Administered 2021-01-27: 11:00:00 500 [IU]

## 2021-01-27 MED ORDER — SODIUM CHLORIDE 0.9% FLUSH
10.0000 mL | Freq: Once | INTRAVENOUS | Status: AC
  Administered 2021-01-27: 11:00:00 10 mL

## 2021-01-27 NOTE — Assessment & Plan Note (Signed)
She has minimal residual peripheral neuropathy She will continue gabapentin as needed

## 2021-01-27 NOTE — Progress Notes (Signed)
[place] OFFICE PROGRESS NOTE  Patient Care Team: Hali Marry, MD as PCP - General (Family Medicine) Druscilla Brownie, MD (Dermatology) Kristeen Miss, MD (Neurosurgery) Guss Bunde, MD (Obstetrics and Gynecology) Awanda Mink Craige Cotta, RN as Oncology Nurse Navigator (Oncology)  ASSESSMENT & PLAN:  Endometrial cancer [facility]) The patient is at high risk of cancer recurrence The palpable subcutaneous nodule on her chest wall is concerning for cancer I recommend CT imaging of the chest, abdomen and pelvis for evaluation and she is in agreement  Peripheral neuropathy due to chemotherapy [facility]) She has minimal residual peripheral neuropathy She will continue gabapentin as needed  History of ductal carcinoma in situ (DCIS) of breast She has history of bilateral breast cancer status post bilateral mastectomy The palpable nodule is concerning for possible cancer recurrence and I recommend CT imaging of the chest, abdomen and pelvis and she is in agreement to proceed  Obesity, Class II, BMI 35-39.9 She has lost a lot of weight through aggressive dietary modification I congratulate the patient's effort and encouraged her to continue  Orders Placed This Encounter  Procedures   CT CHEST ABDOMEN PELVIS W CONTRAST    Standing Status:   Future    Standing Expiration Date:   01/27/2022    Order Specific Question:   Preferred imaging location?    Answer:   [facility]    Order Specific Question:   Radiology Contrast Protocol - do NOT remove file path    Answer:   [email or web address]    All questions were answered. The patient knows to call the clinic with any problems, questions or concerns. The total time spent in the appointment was 30 minutes encounter with patients including review of chart and various tests results, discussions about plan of care and coordination of care plan   [name], MD 01/27/2021 1:00  PM  INTERVAL HISTORY: Please see below for problem oriented charting. she returns for surveillance follow-up after completion of treatment for uterine cancer She also had history of bilateral breast cancer status postchemotherapy and mastectomy She have lost a lot of weight since last time I saw her through aggressive weight loss effort She noted protuberant nodule on her left chest wall It is nonpainful She denies changes in her bowel habits or recent bloating  REVIEW OF SYSTEMS:   Constitutional: Denies fevers, chills or abnormal weight loss Eyes: Denies blurriness of vision Ears, nose, mouth, throat, and face: Denies mucositis or sore throat Respiratory: Denies cough, dyspnea or wheezes Cardiovascular: Denies palpitation, chest discomfort or lower extremity swelling Gastrointestinal:  Denies nausea, heartburn or change in bowel habits Skin: Denies abnormal skin rashes Lymphatics: Denies new lymphadenopathy or easy bruising Neurological:Denies numbness, tingling or new weaknesses Behavioral/Psych: Mood is stable, no new changes  All other systems were reviewed with the patient and are negative.  I have reviewed the past medical history, past surgical history, social history and family history with the patient and they are unchanged from previous note.  ALLERGIES:  has No Known Allergies.  MEDICATIONS:  Current Outpatient Medications  Medication Sig Dispense Refill   AMBULATORY NON FORMULARY MEDICATION Medication Name: CPAP with humidifier with 11 cm water pressure.  Dx OSA.  See attached Sleep Study.  Please fax to [place]. 1 Units 0   CALCIUM PO Take 1 tablet by mouth daily.     DULoxetine (CYMBALTA) 60 MG capsule TAKE 1 CAPSULE BY MOUTH  DAILY 90 capsule 3   fluticasone (FLONASE) 50 MCG/ACT nasal  spray Place 2 sprays into both nostrils daily as needed for allergies or rhinitis.     gabapentin (NEURONTIN) 300 MG capsule TAKE 2 CAPSULES BY MOUTH  DAILY 180 capsule 3   Krill  Oil 500 MG CAPS Take 1 capsule by mouth daily.     lidocaine-prilocaine (EMLA) cream Apply 1 application topically as needed.     Multiple Vitamins-Minerals (CENTRUM SILVER 50+WOMEN) TABS Take 1 capsule by mouth daily.     Multiple Vitamins-Minerals (PRESERVISION AREDS) CAPS Take 1 capsule by mouth daily.     polyethylene glycol (MIRALAX / GLYCOLAX) 17 g packet Take 17 g by mouth daily as needed.     rOPINIRole (REQUIP) 0.5 MG tablet TAKE 1 TABLET BY MOUTH AT  BEDTIME 90 tablet 3   vitamin C (ASCORBIC ACID) 500 MG tablet Take 500 mg by mouth daily.     Vitamin D, Ergocalciferol, (DRISDOL) 1.25 MG (50000 UNIT) CAPS capsule Take 1 capsule (50,000 Units total) by mouth every 7 (seven) days. 4 capsule 0   No current facility-administered medications for this visit.    SUMMARY OF ONCOLOGIC HISTORY: Oncology History Overview Note  MSI Stable Negative genetics   Endometrial cancer ([place])  07/28/2019 Pathology Results   Endometrium, biopsy - ENDOMETRIOID CARCINOMA - SEE COMMENT Microscopic Comment Based on the biopsy, the carcinoma appears FIGO grade 1.   08/01/2019 Imaging   CT abdomen and pelvis No evidence of abdominal or pelvic metastatic disease.   Small uterine fibroid.   Colonic diverticulosis, without radiographic evidence of diverticulitis.   Aortic Atherosclerosis (ICD10-I70.0).   08/14/2019 Surgery   Surgeon: Donaciano Eva  Operation: Robotic-assisted laparoscopic total hysterectomy with bilateral salpingoophorectomy, SLN biopsy     Operative Findings:  : 8cm uterus which appeared grossly normal, normal appearing tubes and ovaries, no suspicious lymph nodes. Brisk oozing from skin incisions.    09/03/2019 Cancer Staging   Staging form: Corpus Uteri - Carcinoma and Carcinosarcoma, AJCC 8th Edition - Pathologic stage from 09/03/2019: FIGO Stage IIIA (pT3a, pN0, cM0) - Signed by [name], MD on 09/03/2019    09/04/2019 Pathology Results   FINAL MICROSCOPIC DIAGNOSIS:    A. LYMPH NODE, SENTINEL RIGHT OBTURATOR PROXIMAL, BIOPSY:  -  No carcinoma identified in one lymph node (0/1)  -  See comment   B. LYMPH NODE, SENTINEL RIGHT OBTURATOR DISTAL, BIOPSY:  -  No carcinoma identified in one lymph node (0/1)  -  See comment   C. LYMPH NODE, SENTINEL LEFT EXTERNAL ILIAC, BIOPSY:  -  No carcinoma identified in one lymph node (0/1)  -  See comment   D. UTERUS AND BILATERAL ADNEXA, HYSTERECTOMY WITH SALPINGO-OOPHORECTOMY:   Uterus:  -  Endometrioid carcinoma, FIGO grade 1  -  Leiomyoma (1.8 cm; largest)  -  See oncology table and comment below   Cervix:  -  Cervix with squamous metaplasia  -  No carcinoma identified   Bilateral Ovaries:  -  Endometrioid carcinoma involving right ovary  -  No carcinoma involving left ovary  -  Endosalpingiosis   Bilateral Fallopian tubes:  -  No carcinoma identified   ONCOLOGY TABLE:   UTERUS, CARCINOMA OR CARCINOSARCOMA   Procedure: Total hysterectomy and bilateral salpingo-oophorectomy  Histologic type: Endometrioid carcinoma, NOS  Histologic Grade: FIGO grade 1  Myometrial invasion:       Depth of invasion: 3 mm       Myometrial thickness: 20 mm  Uterine Serosa Involvement: Not identified  Cervical stromal involvement: Not identified  Extent of involvement of other organs: Right ovary  Lymphovascular invasion: Not identified  Regional Lymph Nodes:       Examined:      3 Sentinel                               0 non-sentinel                               3 total        Lymph nodes with metastasis: 0        Isolated tumor cells (<0.2 mm): 0        Micrometastasis:  (>0.2 mm and < 2.0 mm): 0        Macrometastasis: (>2.0 mm): 0        Extracapsular extension: N/A  Representative Tumor Block: D10  MMR / MSI testing: Pending  Pathologic Stage Classification (pTNM, AJCC 8th edition):  pT3a, pN0  Comments: Pancytokeratin performed on the lymph nodes is negative. The right ovary has a focus of carcinoma.      09/11/2019 Procedure   Placement of single lumen port a cath via right internal jugular vein. The catheter tip lies at the cavo-atrial junction. A power injectable port a cath was placed and is ready for immediate use.     09/12/2019 - 12/26/2019 Chemotherapy   The patient had carboplatin and taxol for chemotherapy treatment.     01/19/2020 Imaging   1. Interval total hysterectomy with bilateral salpingo oophorectomy. No findings to suggest recurrent or metastatic disease. 2. Tiny hiatal hernia. 3. Left colonic diverticulosis without diverticulitis. 4. Aortic Atherosclerosis (ICD10-I70.0).     PHYSICAL EXAMINATION: ECOG PERFORMANCE STATUS: 1 - Symptomatic but completely ambulatory  Vitals:   01/27/21 1125  BP: (!) 126/101  Pulse: (!) 107  Resp: 18  Temp: (!) 97.4 F (36.3 C)  SpO2: 98%   Filed Weights   01/27/21 1125  Weight: 218 lb 6.4 oz (99.1 kg)    GENERAL:alert, no distress and comfortable SKIN: skin color, texture, turgor are normal, no rashes or significant lesions EYES: normal, Conjunctiva are pink and non-injected, sclera clear OROPHARYNX:no exudate, no erythema and lips, buccal mucosa, and tongue normal  NECK: supple, thyroid normal size, non-tender, without nodularity LYMPH:  no palpable lymphadenopathy in the cervical, axillary or inguinal LUNGS: clear to auscultation and percussion with normal breathing effort HEART: regular rate & rhythm and no murmurs and no lower extremity edema ABDOMEN:abdomen soft, non-tender and normal bowel sounds Musculoskeletal:no cyanosis of digits and no clubbing .  She has palpable nodule noted on the left chest wall NEURO: alert & oriented x 3 with fluent speech, no focal motor/sensory deficits  LABORATORY DATA:  I have reviewed the data as listed    Component Value Date/Time   NA 138 01/27/2021 1053   NA 141 11/17/2020 1048   NA 141 01/20/2014 1604   K 4.2 01/27/2021 1053   K 4.2 01/20/2014 1604   CL 104 01/27/2021  1053   CO2 29 01/27/2021 1053   CO2 28 01/20/2014 1604   GLUCOSE 101 (H) 01/27/2021 1053   GLUCOSE 99 01/20/2014 1604   BUN 16 01/27/2021 1053   BUN 15 11/17/2020 1048   BUN 14.6 01/20/2014 1604   CREATININE 0.71 01/27/2021 1053   CREATININE 0.78 03/24/2019 1041   CREATININE 0.8 01/20/2014 1604   CALCIUM 9.4 01/27/2021 1053  CALCIUM 9.8 01/20/2014 1604   PROT 7.3 01/27/2021 1053   PROT 7.2 11/17/2020 1048   PROT 7.3 01/20/2014 1604   ALBUMIN 4.2 01/27/2021 1053   ALBUMIN 4.6 11/17/2020 1048   ALBUMIN 4.0 01/20/2014 1604   AST 16 01/27/2021 1053   AST 22 01/20/2014 1604   ALT 12 01/27/2021 1053   ALT 20 01/20/2014 1604   ALKPHOS 106 01/27/2021 1053   ALKPHOS 134 01/20/2014 1604   BILITOT 0.4 01/27/2021 1053   BILITOT <0.20 01/20/2014 1604   GFRNONAA >60 01/27/2021 1053   GFRNONAA 81 03/24/2019 1041   GFRAA >60 10/24/2019 0737   GFRAA 94 03/24/2019 1041    No results found for: SPEP, UPEP  Lab Results  Component Value Date   WBC 7.5 01/27/2021   NEUTROABS 4.9 01/27/2021   HGB 13.1 01/27/2021   HCT 39.3 01/27/2021   MCV 96.8 01/27/2021   PLT 235 01/27/2021      Chemistry      Component Value Date/Time   NA 138 01/27/2021 1053   NA 141 11/17/2020 1048   NA 141 01/20/2014 1604   K 4.2 01/27/2021 1053   K 4.2 01/20/2014 1604   CL 104 01/27/2021 1053   CO2 29 01/27/2021 1053   CO2 28 01/20/2014 1604   BUN 16 01/27/2021 1053   BUN 15 11/17/2020 1048   BUN 14.6 01/20/2014 1604   CREATININE 0.71 01/27/2021 1053   CREATININE 0.78 03/24/2019 1041   CREATININE 0.8 01/20/2014 1604      Component Value Date/Time   CALCIUM 9.4 01/27/2021 1053   CALCIUM 9.8 01/20/2014 1604   ALKPHOS 106 01/27/2021 1053   ALKPHOS 134 01/20/2014 1604   AST 16 01/27/2021 1053   AST 22 01/20/2014 1604   ALT 12 01/27/2021 1053   ALT 20 01/20/2014 1604   BILITOT 0.4 01/27/2021 1053   BILITOT <0.20 01/20/2014 1604

## 2021-01-27 NOTE — Assessment & Plan Note (Signed)
She has history of bilateral breast cancer status post bilateral mastectomy The palpable nodule is concerning for possible cancer recurrence and I recommend CT imaging of the chest, abdomen and pelvis and she is in agreement to proceed

## 2021-01-27 NOTE — Assessment & Plan Note (Signed)
The patient is at high risk of cancer recurrence The palpable subcutaneous nodule on her chest wall is concerning for cancer I recommend CT imaging of the chest, abdomen and pelvis for evaluation and she is in agreement

## 2021-01-27 NOTE — Assessment & Plan Note (Signed)
She has lost a lot of weight through aggressive dietary modification I congratulate the patient's effort and encouraged her to continue

## 2021-02-04 ENCOUNTER — Ambulatory Visit (HOSPITAL_COMMUNITY): Payer: 59

## 2021-02-07 ENCOUNTER — Telehealth: Payer: Self-pay | Admitting: *Deleted

## 2021-02-07 DIAGNOSIS — Z0000 Encounter for general adult medical examination without abnormal findings: Secondary | ICD-10-CM

## 2021-02-07 MED ORDER — ROPINIROLE HCL 0.5 MG PO TABS: 0.5000 mg | ORAL_TABLET | Freq: Every day | ORAL | 0 refills | Status: DC

## 2021-02-07 NOTE — Telephone Encounter (Signed)
Pt called and stated that her mail order is out of Ropinirole 0.5 mg. Unsure when they will get this back in stock. She asked that this be sent to her local pharmacy [facility]

## 2021-02-08 ENCOUNTER — Ambulatory Visit: Payer: 59 | Admitting: Hematology and Oncology

## 2021-02-10 ENCOUNTER — Telehealth (INDEPENDENT_AMBULATORY_CARE_PROVIDER_SITE_OTHER): Payer: 59 | Admitting: Family Medicine

## 2021-02-10 ENCOUNTER — Encounter (INDEPENDENT_AMBULATORY_CARE_PROVIDER_SITE_OTHER): Payer: Self-pay | Admitting: Family Medicine

## 2021-02-10 DIAGNOSIS — Z6837 Body mass index (BMI) 37.0-37.9, adult: Secondary | ICD-10-CM

## 2021-02-10 DIAGNOSIS — E559 Vitamin D deficiency, unspecified: Secondary | ICD-10-CM

## 2021-02-10 MED ORDER — VITAMIN D (ERGOCALCIFEROL) 1.25 MG (50000 UNIT) PO CAPS: 50000.0000 [IU] | ORAL_CAPSULE | ORAL | 0 refills | Status: DC

## 2021-02-14 NOTE — Progress Notes (Signed)
TeleHealth Visit:  Due to the COVID-19 pandemic, this visit was completed with telemedicine (audio/video) technology to reduce patient and provider exposure as well as to preserve personal protective equipment.   Patricia Waters has verbally consented to this TeleHealth visit. The patient is located at home, the provider is located at the [facility] office. The participants in this visit include the listed provider and patient. The visit was conducted today via MyChart video.   Chief Complaint: OBESITY Patricia Waters is here to discuss her progress with her obesity treatment plan along with follow-up of her obesity related diagnoses. Patricia Waters is on the Category 3 Plan and states she is following her eating plan approximately 65% of the time. Patricia Waters states she is walking for 60 minutes 3 times per week.  Today's visit was #: 14 Starting weight: 258 lbs Starting date: 03/24/2020  Interim History: Patricia Waters did well with avoiding holiday weight gain. She worked on increasing her protein and portion controlled the simple carbohydrates.  Subjective:   1. Vitamin D deficiency Patricia Waters is stable on Vit D, and she has no signs of over-replacement.  Assessment/Plan:   1. Vitamin D deficiency We will refill prescription Vitamin D for 1 month. We will recheck labs next month, and Patricia Waters will follow-up for routine testing of Vitamin D, at least 2-3 times per year to avoid over-replacement.  - Vitamin D, Ergocalciferol, (DRISDOL) 1.25 MG (50000 UNIT) CAPS capsule; Take 1 capsule (50,000 Units total) by mouth every 7 (seven) days.  Dispense: 4 capsule; Refill: 0  2. Obesity with current BMI 37.9 Patricia Waters is currently in the action stage of change. As such, her goal is to continue with weight loss efforts. She has agreed to the Category 3 Plan.   Exercise goals: As is.  Behavioral modification strategies: increasing lean protein intake and meal planning and cooking strategies.  Patricia Waters has  agreed to follow-up with our clinic in 4 weeks. She was informed of the importance of frequent follow-up visits to maximize her success with intensive lifestyle modifications for her multiple health conditions.  Objective:   VITALS: Per patient if applicable, see vitals. GENERAL: Alert and in no acute distress. CARDIOPULMONARY: No increased WOB. Speaking in clear sentences.  PSYCH: Pleasant and cooperative. Speech normal rate and rhythm. Affect is appropriate. Insight and judgement are appropriate. Attention is focused, linear, and appropriate.  NEURO: Oriented as arrived to appointment on time with no prompting.   Lab Results  Component Value Date   CREATININE 0.71 01/27/2021   BUN 16 01/27/2021   NA 138 01/27/2021   K 4.2 01/27/2021   CL 104 01/27/2021   CO2 29 01/27/2021   Lab Results  Component Value Date   ALT 12 01/27/2021   AST 16 01/27/2021   ALKPHOS 106 01/27/2021   BILITOT 0.4 01/27/2021   Lab Results  Component Value Date   HGBA1C 5.6 11/17/2020   HGBA1C 5.5 06/16/2020   HGBA1C 4.9 03/24/2020   HGBA1C 5.7 (A) 12/22/2019   HGBA1C 5.4 03/24/2019   Lab Results  Component Value Date   INSULIN 10.5 11/17/2020   INSULIN 8.1 06/16/2020   INSULIN 10.2 03/24/2020   Lab Results  Component Value Date   TSH 3.210 03/24/2020   Lab Results  Component Value Date   CHOL 208 (H) 11/17/2020   HDL 57 11/17/2020   LDLCALC 130 (H) 11/17/2020   TRIG 119 11/17/2020   CHOLHDL 3.3 03/24/2019   Lab Results  Component Value Date   VD25OH 63.2  11/17/2020   VD25OH 68.6 06/16/2020   VD25OH 40.3 03/24/2020   Lab Results  Component Value Date   WBC 7.5 01/27/2021   HGB 13.1 01/27/2021   HCT 39.3 01/27/2021   MCV 96.8 01/27/2021   PLT 235 01/27/2021   Lab Results  Component Value Date   IRON 85 11/25/2015   TIBC 345 11/25/2015   FERRITIN 89 11/25/2015    Attestation Statements:   Reviewed by clinician on day of visit: allergies, medications, problem list, medical  history, surgical history, family history, social history, and previous encounter notes.   I, Trixie Dredge, am acting as transcriptionist for [name], MD.  I have reviewed the above documentation for accuracy and completeness, and I agree with the above. - [name], MD

## 2021-02-17 ENCOUNTER — Ambulatory Visit (HOSPITAL_COMMUNITY)
Admission: RE | Admit: 2021-02-17 | Discharge: 2021-02-17 | Disposition: A | Discharge status: Home / Self Care | Payer: 59 | Source: Ambulatory Visit | Attending: Hematology and Oncology | Admitting: Hematology and Oncology

## 2021-02-17 ENCOUNTER — Other Ambulatory Visit (HOSPITAL_COMMUNITY): Payer: 59

## 2021-02-17 ENCOUNTER — Other Ambulatory Visit: Payer: Self-pay

## 2021-02-17 DIAGNOSIS — C541 Malignant neoplasm of endometrium: Secondary | ICD-10-CM | POA: Diagnosis not present

## 2021-02-17 DIAGNOSIS — Z86000 Personal history of in-situ neoplasm of breast: Secondary | ICD-10-CM | POA: Diagnosis present

## 2021-02-17 MED ORDER — IOHEXOL 300 MG/ML  SOLN
100.0000 mL | Freq: Once | INTRAMUSCULAR | Status: AC | PRN
  Administered 2021-02-17: 100 mL via INTRAVENOUS

## 2021-02-22 ENCOUNTER — Inpatient Hospital Stay: Payer: 59 | Attending: Hematology and Oncology | Admitting: Hematology and Oncology

## 2021-02-22 ENCOUNTER — Other Ambulatory Visit: Payer: Self-pay

## 2021-02-22 ENCOUNTER — Encounter: Payer: Self-pay | Admitting: Hematology and Oncology

## 2021-02-22 DIAGNOSIS — C541 Malignant neoplasm of endometrium: Secondary | ICD-10-CM | POA: Diagnosis present

## 2021-02-22 DIAGNOSIS — K5790 Diverticulosis of intestine, part unspecified, without perforation or abscess without bleeding: Secondary | ICD-10-CM | POA: Insufficient documentation

## 2021-02-22 DIAGNOSIS — Z9013 Acquired absence of bilateral breasts and nipples: Secondary | ICD-10-CM | POA: Diagnosis not present

## 2021-02-22 DIAGNOSIS — Z6838 Body mass index (BMI) 38.0-38.9, adult: Secondary | ICD-10-CM | POA: Insufficient documentation

## 2021-02-22 DIAGNOSIS — K5730 Diverticulosis of large intestine without perforation or abscess without bleeding: Secondary | ICD-10-CM | POA: Insufficient documentation

## 2021-02-22 DIAGNOSIS — Z86000 Personal history of in-situ neoplasm of breast: Secondary | ICD-10-CM | POA: Diagnosis not present

## 2021-02-22 MED ORDER — LIDOCAINE-PRILOCAINE 2.5-2.5 % EX CREA: 1.0000 "application " | TOPICAL_CREAM | CUTANEOUS | 3 refills | Status: DC | PRN

## 2021-02-22 NOTE — Assessment & Plan Note (Signed)
We had extensive discussions in the past regarding the role of weight loss as a preventive strategy to reduce risk of breast cancer and endometrial cancer recurrence She appears motivated to work hard on dietary changes and exercise 

## 2021-02-22 NOTE — Assessment & Plan Note (Signed)
The patient is completely asymptomatic I have reviewed CT imaging with the patient which show no evidence of disease She will see Dr. Berline Lopes again in 3 months and I will see her in 6 months for further follow-up We will continue port maintain and flushes and blood work

## 2021-02-22 NOTE — Progress Notes (Signed)
[place] OFFICE PROGRESS NOTE  Patient Care Team: Hali Marry, MD as PCP - General (Family Medicine) Druscilla Brownie, MD (Dermatology) Kristeen Miss, MD (Neurosurgery) Guss Bunde, MD (Obstetrics and Gynecology) Awanda Mink Craige Cotta, RN as Oncology Nurse Navigator (Oncology)  ASSESSMENT & PLAN:  Endometrial cancer [facility]) The patient is completely asymptomatic I have reviewed CT imaging with the patient which show no evidence of disease She will see Dr. Berline Lopes again in 3 months and I will see her in 6 months for further follow-up We will continue port maintain and flushes and blood work  History of ductal carcinoma in situ (DCIS) of breast Her examination of her chest wall is benign She does not need surveillance imaging for this  BMI 38.0-38.9,adult We had extensive discussions in the past regarding the role of weight loss as a preventive strategy to reduce risk of breast cancer and endometrial cancer recurrence She appears motivated to work hard on dietary changes and exercise  Diverticulosis of colon Recent constipation is due to diverticulosis We discussed importance of regular laxatives  No orders of the defined types were placed in this encounter.   All questions were answered. The patient knows to call the clinic with any problems, questions or concerns. The total time spent in the appointment was 30 minutes encounter with patients including review of chart and various tests results, discussions about plan of care and coordination of care plan   [name], MD 02/22/2021 2:04 PM  INTERVAL HISTORY: Please see below for problem oriented charting. she returns for surveillance follow-up for history of breast cancer and endometrial cancer She is doing well She has gained some weight since the holidays Denies abdominal pain but did have some intermittent constipation, resolved with laxatives She denies any recent abnormal breast examination,  palpable mass, abnormal breast appearance or nipple changes  REVIEW OF SYSTEMS:   Constitutional: Denies fevers, chills or abnormal weight loss Eyes: Denies blurriness of vision Ears, nose, mouth, throat, and face: Denies mucositis or sore throat Respiratory: Denies cough, dyspnea or wheezes Cardiovascular: Denies palpitation, chest discomfort or lower extremity swelling Gastrointestinal:  Denies nausea, heartburn or change in bowel habits Skin: Denies abnormal skin rashes Lymphatics: Denies new lymphadenopathy or easy bruising Neurological:Denies numbness, tingling or new weaknesses Behavioral/Psych: Mood is stable, no new changes  All other systems were reviewed with the patient and are negative.  I have reviewed the past medical history, past surgical history, social history and family history with the patient and they are unchanged from previous note.  ALLERGIES:  has No Known Allergies.  MEDICATIONS:  Current Outpatient Medications  Medication Sig Dispense Refill   AMBULATORY NON FORMULARY MEDICATION Medication Name: CPAP with humidifier with 11 cm water pressure.  Dx OSA.  See attached Sleep Study.  Please fax to [facility]. 1 Units 0   CALCIUM PO Take 1 tablet by mouth daily.     DULoxetine (CYMBALTA) 60 MG capsule TAKE 1 CAPSULE BY MOUTH  DAILY 90 capsule 3   fluticasone (FLONASE) 50 MCG/ACT nasal spray Place 2 sprays into both nostrils daily as needed for allergies or rhinitis.     gabapentin (NEURONTIN) 300 MG capsule TAKE 2 CAPSULES BY MOUTH  DAILY 180 capsule 3   Krill Oil 500 MG CAPS Take 1 capsule by mouth daily.     lidocaine-prilocaine (EMLA) cream Apply 1 application topically as needed. 30 g 3   Multiple Vitamins-Minerals (CENTRUM SILVER 50+WOMEN) TABS Take 1 capsule by mouth daily.  Multiple Vitamins-Minerals (PRESERVISION AREDS) CAPS Take 1 capsule by mouth daily.     polyethylene glycol (MIRALAX / GLYCOLAX) 17 g packet Take 17 g by mouth daily as needed.      rOPINIRole (REQUIP) 0.5 MG tablet Take 1 tablet (0.5 mg total) by mouth at bedtime. 90 tablet 0   vitamin C (ASCORBIC ACID) 500 MG tablet Take 500 mg by mouth daily.     Vitamin D, Ergocalciferol, (DRISDOL) 1.25 MG (50000 UNIT) CAPS capsule Take 1 capsule (50,000 Units total) by mouth every 7 (seven) days. 4 capsule 0   No current facility-administered medications for this visit.    SUMMARY OF ONCOLOGIC HISTORY: Oncology History Overview Note  MSI Stable Negative genetics   Endometrial cancer ([place])  07/28/2019 Pathology Results   Endometrium, biopsy - ENDOMETRIOID CARCINOMA - SEE COMMENT Microscopic Comment Based on the biopsy, the carcinoma appears FIGO grade 1.   08/01/2019 Imaging   CT abdomen and pelvis No evidence of abdominal or pelvic metastatic disease.   Small uterine fibroid.   Colonic diverticulosis, without radiographic evidence of diverticulitis.   Aortic Atherosclerosis (ICD10-I70.0).   08/14/2019 Surgery   Surgeon: Donaciano Eva  Operation: Robotic-assisted laparoscopic total hysterectomy with bilateral salpingoophorectomy, SLN biopsy     Operative Findings:  : 8cm uterus which appeared grossly normal, normal appearing tubes and ovaries, no suspicious lymph nodes. Brisk oozing from skin incisions.    09/03/2019 Cancer Staging   Staging form: Corpus Uteri - Carcinoma and Carcinosarcoma, AJCC 8th Edition - Pathologic stage from 09/03/2019: FIGO Stage IIIA (pT3a, pN0, cM0) - Signed by [name], MD on 09/03/2019    09/04/2019 Pathology Results   FINAL MICROSCOPIC DIAGNOSIS:   A. LYMPH NODE, SENTINEL RIGHT OBTURATOR PROXIMAL, BIOPSY:  -  No carcinoma identified in one lymph node (0/1)  -  See comment   B. LYMPH NODE, SENTINEL RIGHT OBTURATOR DISTAL, BIOPSY:  -  No carcinoma identified in one lymph node (0/1)  -  See comment   C. LYMPH NODE, SENTINEL LEFT EXTERNAL ILIAC, BIOPSY:  -  No carcinoma identified in one lymph node (0/1)  -  See comment   D.  UTERUS AND BILATERAL ADNEXA, HYSTERECTOMY WITH SALPINGO-OOPHORECTOMY:   Uterus:  -  Endometrioid carcinoma, FIGO grade 1  -  Leiomyoma (1.8 cm; largest)  -  See oncology table and comment below   Cervix:  -  Cervix with squamous metaplasia  -  No carcinoma identified   Bilateral Ovaries:  -  Endometrioid carcinoma involving right ovary  -  No carcinoma involving left ovary  -  Endosalpingiosis   Bilateral Fallopian tubes:  -  No carcinoma identified   ONCOLOGY TABLE:   UTERUS, CARCINOMA OR CARCINOSARCOMA   Procedure: Total hysterectomy and bilateral salpingo-oophorectomy  Histologic type: Endometrioid carcinoma, NOS  Histologic Grade: FIGO grade 1  Myometrial invasion:       Depth of invasion: 3 mm       Myometrial thickness: 20 mm  Uterine Serosa Involvement: Not identified  Cervical stromal involvement: Not identified  Extent of involvement of other organs: Right ovary  Lymphovascular invasion: Not identified  Regional Lymph Nodes:       Examined:      3 Sentinel                               0 non-sentinel  3 total        Lymph nodes with metastasis: 0        Isolated tumor cells (<0.2 mm): 0        Micrometastasis:  (>0.2 mm and < 2.0 mm): 0        Macrometastasis: (>2.0 mm): 0        Extracapsular extension: N/A  Representative Tumor Block: D10  MMR / MSI testing: Pending  Pathologic Stage Classification (pTNM, AJCC 8th edition):  pT3a, pN0  Comments: Pancytokeratin performed on the lymph nodes is negative. The right ovary has a focus of carcinoma.     09/11/2019 Procedure   Placement of single lumen port a cath via right internal jugular vein. The catheter tip lies at the cavo-atrial junction. A power injectable port a cath was placed and is ready for immediate use.     09/12/2019 - 12/26/2019 Chemotherapy   The patient had carboplatin and taxol for chemotherapy treatment.     01/19/2020 Imaging   1. Interval total hysterectomy  with bilateral salpingo oophorectomy. No findings to suggest recurrent or metastatic disease. 2. Tiny hiatal hernia. 3. Left colonic diverticulosis without diverticulitis. 4. Aortic Atherosclerosis (ICD10-I70.0).   02/18/2021 Imaging   1. No evidence of metastatic disease in the chest, abdomen or pelvis. No evidence of a left chest wall mass to correlate with reported palpable abnormality. 2. Marked sigmoid diverticulosis. 3. Small hiatal hernia. 4. Aortic Atherosclerosis (ICD10-I70.0).       PHYSICAL EXAMINATION: ECOG PERFORMANCE STATUS: 0 - Asymptomatic  Vitals:   02/22/21 1146  BP: 115/67  Pulse: 63  Resp: 18  Temp: 97.6 F (36.4 C)  SpO2: 98%   Filed Weights   02/22/21 1146  Weight: 222 lb 9.6 oz (101 kg)    GENERAL:alert, no distress and comfortable SKIN: skin color, texture, turgor are normal, no rashes or significant lesions EYES: normal, Conjunctiva are pink and non-injected, sclera clear OROPHARYNX:no exudate, no erythema and lips, buccal mucosa, and tongue normal  NECK: supple, thyroid normal size, non-tender, without nodularity LYMPH:  no palpable lymphadenopathy in the cervical, axillary or inguinal LUNGS: clear to auscultation and percussion with normal breathing effort HEART: regular rate & rhythm and no murmurs and no lower extremity edema ABDOMEN:abdomen soft, non-tender and normal bowel sounds Musculoskeletal:no cyanosis of digits and no clubbing  NEURO: alert & oriented x 3 with fluent speech, no focal motor/sensory deficits Well-healed surgical scar on her chest wall with no abnormal palpable nodules  LABORATORY DATA:  I have reviewed the data as listed    Component Value Date/Time   NA 138 01/27/2021 1053   NA 141 11/17/2020 1048   NA 141 01/20/2014 1604   K 4.2 01/27/2021 1053   K 4.2 01/20/2014 1604   CL 104 01/27/2021 1053   CO2 29 01/27/2021 1053   CO2 28 01/20/2014 1604   GLUCOSE 101 (H) 01/27/2021 1053   GLUCOSE 99 01/20/2014 1604    BUN 16 01/27/2021 1053   BUN 15 11/17/2020 1048   BUN 14.6 01/20/2014 1604   CREATININE 0.71 01/27/2021 1053   CREATININE 0.78 03/24/2019 1041   CREATININE 0.8 01/20/2014 1604   CALCIUM 9.4 01/27/2021 1053   CALCIUM 9.8 01/20/2014 1604   PROT 7.3 01/27/2021 1053   PROT 7.2 11/17/2020 1048   PROT 7.3 01/20/2014 1604   ALBUMIN 4.2 01/27/2021 1053   ALBUMIN 4.6 11/17/2020 1048   ALBUMIN 4.0 01/20/2014 1604   AST 16 01/27/2021 1053   AST 22 01/20/2014 1604  ALT 12 01/27/2021 1053   ALT 20 01/20/2014 1604   ALKPHOS 106 01/27/2021 1053   ALKPHOS 134 01/20/2014 1604   BILITOT 0.4 01/27/2021 1053   BILITOT <0.20 01/20/2014 1604   GFRNONAA >60 01/27/2021 1053   GFRNONAA 81 03/24/2019 1041   GFRAA >60 10/24/2019 0737   GFRAA 94 03/24/2019 1041    No results found for: SPEP, UPEP  Lab Results  Component Value Date   WBC 7.5 01/27/2021   NEUTROABS 4.9 01/27/2021   HGB 13.1 01/27/2021   HCT 39.3 01/27/2021   MCV 96.8 01/27/2021   PLT 235 01/27/2021      Chemistry      Component Value Date/Time   NA 138 01/27/2021 1053   NA 141 11/17/2020 1048   NA 141 01/20/2014 1604   K 4.2 01/27/2021 1053   K 4.2 01/20/2014 1604   CL 104 01/27/2021 1053   CO2 29 01/27/2021 1053   CO2 28 01/20/2014 1604   BUN 16 01/27/2021 1053   BUN 15 11/17/2020 1048   BUN 14.6 01/20/2014 1604   CREATININE 0.71 01/27/2021 1053   CREATININE 0.78 03/24/2019 1041   CREATININE 0.8 01/20/2014 1604      Component Value Date/Time   CALCIUM 9.4 01/27/2021 1053   CALCIUM 9.8 01/20/2014 1604   ALKPHOS 106 01/27/2021 1053   ALKPHOS 134 01/20/2014 1604   AST 16 01/27/2021 1053   AST 22 01/20/2014 1604   ALT 12 01/27/2021 1053   ALT 20 01/20/2014 1604   BILITOT 0.4 01/27/2021 1053   BILITOT <0.20 01/20/2014 1604       RADIOGRAPHIC STUDIES: I have reviewed CT imaging with the patient I have personally reviewed the radiological images as listed and agreed with the findings in the report. CT CHEST  ABDOMEN PELVIS W CONTRAST  Result Date: 02/17/2021 CLINICAL DATA:  Restaging endometrial cancer. Palpable left chest wall mass. Hysterectomy in 2021. History of chemotherapy and radiation therapy. Breast cancer status post bilateral mastectomy in 2005. EXAM: CT CHEST, ABDOMEN, AND PELVIS WITH CONTRAST TECHNIQUE: Multidetector CT imaging of the chest, abdomen and pelvis was performed following the standard protocol during bolus administration of intravenous contrast. RADIATION DOSE REDUCTION: This exam was performed according to the departmental dose-optimization program which includes automated exposure control, adjustment of the mA and/or kV according to patient size and/or use of iterative reconstruction technique. CONTRAST:  111m OMNIPAQUE IOHEXOL 300 MG/ML  SOLN COMPARISON:  01/19/2020 CT abdomen/pelvis.  03/02/2005 PET-CT. FINDINGS: CT CHEST FINDINGS Cardiovascular: Normal heart size. No significant pericardial effusion/thickening. Right internal jugular Port-A-Cath terminates at the cavoatrial junction. Atherosclerotic nonaneurysmal thoracic aorta. Top-normal caliber main pulmonary artery (3.3 cm diameter). No central pulmonary emboli. Mediastinum/Nodes: No discrete thyroid nodules. Unremarkable esophagus. Surgical clips in the right axilla. No pathologically enlarged axillary nodes. No mediastinal or hilar lymphadenopathy. Lungs/Pleura: No pneumothorax. No pleural effusion. No acute consolidative airspace disease, lung masses or significant pulmonary nodules. Musculoskeletal: No aggressive appearing focal osseous lesions. Moderate thoracic spondylosis. Status post bilateral mastectomy. No evidence of a chest wall mass or fluid collection. CT ABDOMEN PELVIS FINDINGS Hepatobiliary: Normal liver with no liver mass. Normal gallbladder with no radiopaque cholelithiasis. No biliary ductal dilatation. Pancreas: Normal, with no mass or duct dilation. Spleen: Normal size. No mass. Adrenals/Urinary Tract: Normal  adrenals. Normal kidneys with no hydronephrosis and no renal mass. Normal bladder. Stomach/Bowel: Small hiatal hernia. Otherwise normal nondistended stomach. Normal caliber small bowel with no small bowel wall thickening. Oral contrast transits to the left colon. Normal  appendix. Marked sigmoid diverticulosis, with no large bowel wall thickening or significant pericolonic fat stranding. Vascular/Lymphatic: Atherosclerotic nonaneurysmal abdominal aorta. Patent portal, splenic, hepatic and renal veins. No pathologically enlarged lymph nodes in the abdomen or pelvis. Reproductive: Status post hysterectomy, with no abnormal findings at the vaginal cuff. No adnexal mass. Other: No pneumoperitoneum, ascites or focal fluid collection. Musculoskeletal: No aggressive appearing focal osseous lesions. Marked lower lumbar degenerative disc disease. IMPRESSION: 1. No evidence of metastatic disease in the chest, abdomen or pelvis. No evidence of a left chest wall mass to correlate with reported palpable abnormality. 2. Marked sigmoid diverticulosis. 3. Small hiatal hernia. 4. Aortic Atherosclerosis (ICD10-I70.0). Electronically Signed   By: [name] Sorrel M.D.   On: 02/17/2021 18:47

## 2021-02-22 NOTE — Assessment & Plan Note (Signed)
Her examination of her chest wall is benign She does not need surveillance imaging for this

## 2021-02-22 NOTE — Assessment & Plan Note (Signed)
Recent constipation is due to diverticulosis We discussed importance of regular laxatives

## 2021-02-25 ENCOUNTER — Other Ambulatory Visit: Payer: Self-pay | Admitting: Family Medicine

## 2021-03-01 ENCOUNTER — Telehealth: Payer: Self-pay | Admitting: *Deleted

## 2021-03-01 NOTE — Telephone Encounter (Signed)
Scheduled the patient for a follow up with Dr Berline Lopes and adjusted her port flushed

## 2021-03-10 ENCOUNTER — Other Ambulatory Visit: Payer: Self-pay

## 2021-03-10 ENCOUNTER — Ambulatory Visit (INDEPENDENT_AMBULATORY_CARE_PROVIDER_SITE_OTHER): Payer: 59 | Admitting: Family Medicine

## 2021-03-10 ENCOUNTER — Encounter (INDEPENDENT_AMBULATORY_CARE_PROVIDER_SITE_OTHER): Payer: Self-pay | Admitting: Family Medicine

## 2021-03-10 VITALS — BP 118/85 | HR 71 | Temp 98.0°F | Ht 64.0 in | Wt 216.0 lb

## 2021-03-10 DIAGNOSIS — Z6837 Body mass index (BMI) 37.0-37.9, adult: Secondary | ICD-10-CM | POA: Diagnosis not present

## 2021-03-10 DIAGNOSIS — E669 Obesity, unspecified: Secondary | ICD-10-CM | POA: Diagnosis not present

## 2021-03-10 DIAGNOSIS — Z9189 Other specified personal risk factors, not elsewhere classified: Secondary | ICD-10-CM | POA: Diagnosis not present

## 2021-03-10 DIAGNOSIS — E559 Vitamin D deficiency, unspecified: Secondary | ICD-10-CM | POA: Diagnosis not present

## 2021-03-10 MED ORDER — VITAMIN D (ERGOCALCIFEROL) 1.25 MG (50000 UNIT) PO CAPS: 50000.0000 [IU] | ORAL_CAPSULE | ORAL | 0 refills | Status: DC

## 2021-03-10 NOTE — Progress Notes (Signed)
Chief Complaint:   OBESITY Patricia Waters is here to discuss her progress with her obesity treatment plan along with follow-up of her obesity related diagnoses. Patricia Waters is on the Category 3 Plan and states she is following her eating plan approximately 60% of the time. Patricia Waters states she is walking for 3 times per week.  Today's visit was #: 15 Starting weight: 258 lbs Starting date: 03/24/2020 Today's weight: 216 lbs Today's date: 03/10/2021 Total lbs lost to date: 42 Total lbs lost since last in-office visit: 4  Interim History: Patricia Waters continues to do well with weight loss on her plan. Her hunger is appropriate. She is working on meeting her protein goals.  Subjective:   1. Vitamin D deficiency Patricia Waters is stable on Vitamin D, and she denies nausea, vomiting, or muscle weakness.  2. At risk for impaired metabolic function Patricia Waters is at increased risk for impaired metabolic function if protein is too low.  Assessment/Plan:   1. Vitamin D deficiency Low Vitamin D level contributes to fatigue and are associated with obesity, breast, and colon cancer. We will refill prescription Vitamin D for 1 month. Patricia Waters will follow-up for routine testing of Vitamin D, at least 2-3 times per year to avoid over-replacement.  - Vitamin D, Ergocalciferol, (DRISDOL) 1.25 MG (50000 UNIT) CAPS capsule; Take 1 capsule (50,000 Units total) by mouth every 7 (seven) days.  Dispense: 4 capsule; Refill: 0  2. At risk for impaired metabolic function Patricia Waters was given approximately 15 minutes of impaired  metabolic function prevention counseling today. We discussed intensive lifestyle modifications today with an emphasis on specific nutrition and exercise instructions and strategies.   Repetitive spaced learning was employed today to elicit superior memory formation and behavioral change.  3. Obesity with current BMI 37.1 Patricia Waters is currently in the action stage of change. As such, her goal is to continue with  weight loss efforts. She has agreed to the Category 3 Plan and keeping a food journal and adhering to recommended goals of 400-550 calories and 40+ grams of protein daily.   Exercise goals: As is.  Behavioral modification strategies: increasing lean protein intake and no skipping meals.  Patricia Waters has agreed to follow-up with our clinic in 4 weeks. She was informed of the importance of frequent follow-up visits to maximize her success with intensive lifestyle modifications for her multiple health conditions.   Objective:   Blood pressure 118/85, pulse 71, temperature 98 F (36.7 C), height 5\' 4"  (1.626 m), weight 216 lb (98 kg), SpO2 97 %. Body mass index is 37.08 kg/m.  General: Cooperative, alert, well developed, in no acute distress. HEENT: Conjunctivae and lids unremarkable. Cardiovascular: Regular rhythm.  Lungs: Normal work of breathing. Neurologic: No focal deficits.   Lab Results  Component Value Date   CREATININE 0.71 01/27/2021   BUN 16 01/27/2021   NA 138 01/27/2021   K 4.2 01/27/2021   CL 104 01/27/2021   CO2 29 01/27/2021   Lab Results  Component Value Date   ALT 12 01/27/2021   AST 16 01/27/2021   ALKPHOS 106 01/27/2021   BILITOT 0.4 01/27/2021   Lab Results  Component Value Date   HGBA1C 5.6 11/17/2020   HGBA1C 5.5 06/16/2020   HGBA1C 4.9 03/24/2020   HGBA1C 5.7 (A) 12/22/2019   HGBA1C 5.4 03/24/2019   Lab Results  Component Value Date   INSULIN 10.5 11/17/2020   INSULIN 8.1 06/16/2020   INSULIN 10.2 03/24/2020   Lab Results  Component Value Date  TSH 3.210 03/24/2020   Lab Results  Component Value Date   CHOL 208 (H) 11/17/2020   HDL 57 11/17/2020   LDLCALC 130 (H) 11/17/2020   TRIG 119 11/17/2020   CHOLHDL 3.3 03/24/2019   Lab Results  Component Value Date   VD25OH 64.2 11/17/2020   VD25OH 68.6 06/16/2020   VD25OH 40.3 03/24/2020   Lab Results  Component Value Date   WBC 7.5 01/27/2021   HGB 13.1 01/27/2021   HCT 39.3 01/27/2021    MCV 96.8 01/27/2021   PLT 235 01/27/2021   Lab Results  Component Value Date   IRON 85 11/25/2015   TIBC 345 11/25/2015   FERRITIN 89 11/25/2015   Attestation Statements:   Reviewed by clinician on day of visit: allergies, medications, problem list, medical history, surgical history, family history, social history, and previous encounter notes.   I, Trixie Dredge, am acting as transcriptionist for [name], MD.  I have reviewed the above documentation for accuracy and completeness, and I agree with the above. -  [name], MD

## 2021-03-17 ENCOUNTER — Other Ambulatory Visit: Payer: Self-pay

## 2021-03-17 ENCOUNTER — Inpatient Hospital Stay: Payer: 59 | Attending: Hematology and Oncology

## 2021-03-17 DIAGNOSIS — C541 Malignant neoplasm of endometrium: Secondary | ICD-10-CM | POA: Insufficient documentation

## 2021-03-17 LAB — CBC WITH DIFFERENTIAL (CANCER CENTER ONLY)
Abs Immature Granulocytes: 0.01 10*3/uL (ref 0.00–0.07)
Basophils Absolute: 0.1 10*3/uL (ref 0.0–0.1)
Basophils Relative: 1 %
Eosinophils Absolute: 0.1 10*3/uL (ref 0.0–0.5)
Eosinophils Relative: 1 %
HCT: 39 % (ref 36.0–46.0)
Hemoglobin: 13 g/dL (ref 12.0–15.0)
Immature Granulocytes: 0 %
Lymphocytes Relative: 27 %
Lymphs Abs: 2 10*3/uL (ref 0.7–4.0)
MCH: 32.3 pg (ref 26.0–34.0)
MCHC: 33.3 g/dL (ref 30.0–36.0)
MCV: 97 fL (ref 80.0–100.0)
Monocytes Absolute: 0.5 10*3/uL (ref 0.1–1.0)
Monocytes Relative: 6 %
Neutro Abs: 4.8 10*3/uL (ref 1.7–7.7)
Neutrophils Relative %: 65 %
Platelet Count: 241 10*3/uL (ref 150–400)
RBC: 4.02 MIL/uL (ref 3.87–5.11)
RDW: 12.3 % (ref 11.5–15.5)
WBC Count: 7.3 10*3/uL (ref 4.0–10.5)
nRBC: 0 % (ref 0.0–0.2)

## 2021-03-17 LAB — CMP (CANCER CENTER ONLY)
ALT: 13 U/L (ref 0–44)
AST: 17 U/L (ref 15–41)
Albumin: 4.3 g/dL (ref 3.5–5.0)
Alkaline Phosphatase: 111 U/L (ref 38–126)
Anion gap: 5 (ref 5–15)
BUN: 12 mg/dL (ref 8–23)
CO2: 31 mmol/L (ref 22–32)
Calcium: 9.8 mg/dL (ref 8.9–10.3)
Chloride: 103 mmol/L (ref 98–111)
Creatinine: 0.71 mg/dL (ref 0.44–1.00)
GFR, Estimated: >60 mL/min (ref >60)
Glucose, Bld: 96 mg/dL (ref 70–99)
Potassium: 4.1 mmol/L (ref 3.5–5.1)
Sodium: 139 mmol/L (ref 135–145)
Total Bilirubin: 0.4 mg/dL (ref 0.3–1.2)
Total Protein: 7 g/dL (ref 6.5–8.1)

## 2021-03-17 MED ORDER — SODIUM CHLORIDE 0.9% FLUSH
10.0000 mL | Freq: Once | INTRAVENOUS | Status: AC
  Administered 2021-03-17: 10 mL

## 2021-03-17 MED ORDER — HEPARIN SOD (PORK) LOCK FLUSH 100 UNIT/ML IV SOLN
500.0000 [IU] | Freq: Once | INTRAVENOUS | Status: AC
  Administered 2021-03-17: 500 [IU]

## 2021-03-23 ENCOUNTER — Other Ambulatory Visit: Payer: 59

## 2021-04-11 ENCOUNTER — Ambulatory Visit (INDEPENDENT_AMBULATORY_CARE_PROVIDER_SITE_OTHER): Payer: 59 | Admitting: Family Medicine

## 2021-04-11 ENCOUNTER — Encounter (INDEPENDENT_AMBULATORY_CARE_PROVIDER_SITE_OTHER): Payer: Self-pay | Admitting: Family Medicine

## 2021-04-11 ENCOUNTER — Other Ambulatory Visit: Payer: Self-pay

## 2021-04-11 VITALS — BP 130/84 | HR 68 | Temp 98.0°F | Ht 64.0 in | Wt 220.0 lb

## 2021-04-11 DIAGNOSIS — R7303 Prediabetes: Secondary | ICD-10-CM

## 2021-04-11 DIAGNOSIS — Z6837 Body mass index (BMI) 37.0-37.9, adult: Secondary | ICD-10-CM | POA: Diagnosis not present

## 2021-04-11 DIAGNOSIS — E559 Vitamin D deficiency, unspecified: Secondary | ICD-10-CM

## 2021-04-11 DIAGNOSIS — E669 Obesity, unspecified: Secondary | ICD-10-CM | POA: Diagnosis not present

## 2021-04-11 DIAGNOSIS — Z9189 Other specified personal risk factors, not elsewhere classified: Secondary | ICD-10-CM

## 2021-04-11 MED ORDER — VITAMIN D (ERGOCALCIFEROL) 1.25 MG (50000 UNIT) PO CAPS: 50000.0000 [IU] | ORAL_CAPSULE | ORAL | 0 refills | Status: DC

## 2021-04-12 NOTE — Progress Notes (Signed)
? ? ? ?Chief Complaint:  ? ?OBESITY ?Patricia Waters is here to discuss her progress with her obesity treatment plan along with follow-up of her obesity related diagnoses. Patricia Waters is on the Category 3 Plan and keeping a food journal and adhering to recommended goals of 400-550 calories and 40+ grams of protein at supper daily and states she is following her eating plan approximately 50% of the time. Patricia Waters states she is doing yard work and walking for 30 minutes 3 times per week. ? ?Today's visit was #: 16 ?Starting weight: 258 lbs ?Starting date: 03/24/2020 ?Today's weight: 220 lbs ?Today's date: 04/11/2021 ?Total lbs lost to date: 36 ?Total lbs lost since last in-office visit: 0 ? ?Interim History: Patricia Waters had increased celebration eating and traveling and eating out.she also had increased temptations and thought she would have gained more than she did. She is ready to get back on track.  ? ?Subjective:  ? ?1. Vitamin D deficiency ?Patricia Waters is stable on Vit D, and due to have labs done next month. She denies nausea, vomiting, or muscle weakness. ? ?2. Pre-diabetes ?Patricia Waters is working on diet and weight loss. She has struggled a bit more this last month, but she is ready to get back on track. ? ?3. At risk for heart disease ?Patricia Waters is at higher than average risk for cardiovascular disease due to obesity. ? ?Assessment/Plan:  ? ?1. Vitamin D deficiency ?Patricia Waters will continue with prescription Vit D, and we will refill for 1 month. ? ?- Vitamin D, Ergocalciferol, (DRISDOL) 1.25 MG (50000 UNIT) CAPS capsule; Take 1 capsule (50,000 Units total) by mouth every 7 (seven) days.  Dispense: 4 capsule; Refill: 0 ? ?2. Pre-diabetes ?Patricia Waters will continue with diet and exercise, and we will recheck labs in 1 month. ? ?3. At risk for heart disease ?Patricia Waters was given approximately 15 minutes of coronary artery disease prevention counseling today. She is 66 y.o. female and has risk factors for heart disease including obesity. We  discussed intensive lifestyle modifications today with an emphasis on specific weight loss instructions and strategies. ? ?Repetitive spaced learning was employed today to elicit superior memory formation and behavioral change.  ? ?4. Obesity with current BMI 37.9 ?Patricia Waters is currently in the action stage of change. As such, her goal is to continue with weight loss efforts. She has agreed to the Category 3 Plan, keeping a food journal and adhering to recommended goals of 400-550 calories and 40+ grams of protein at supper daily, or following a lower carbohydrate, vegetable and lean protein rich diet plan.  ? ?Exercise goals: As is. ? ?Behavioral modification strategies: increasing lean protein intake and meal planning and cooking strategies. ? ?Patricia Waters has agreed to follow-up with our clinic in 4 weeks. She was informed of the importance of frequent follow-up visits to maximize her success with intensive lifestyle modifications for her multiple health conditions.  ? ?Objective:  ? ?Blood pressure 130/84, pulse 68, temperature 98 ?F (36.7 ?C), height '5\' 4"'$  (1.626 m), weight 220 lb (99.8 kg), SpO2 98 %. ?Body mass index is 37.76 kg/m?. ? ?General: Cooperative, alert, well developed, in no acute distress. ?HEENT: Conjunctivae and lids unremarkable. ?Cardiovascular: Regular rhythm.  ?Lungs: Normal work of breathing. ?Neurologic: No focal deficits.  ? ?Lab Results  ?Component Value Date  ? CREATININE 0.71 03/17/2021  ? BUN 12 03/17/2021  ? NA 139 03/17/2021  ? K 4.1 03/17/2021  ? CL 103 03/17/2021  ? CO2 31 03/17/2021  ? ?Lab Results  ?Component Value Date  ?  ALT 13 03/17/2021  ? AST 17 03/17/2021  ? ALKPHOS 111 03/17/2021  ? BILITOT 0.4 03/17/2021  ? ?Lab Results  ?Component Value Date  ? HGBA1C 5.6 11/17/2020  ? HGBA1C 5.5 06/16/2020  ? HGBA1C 4.9 03/24/2020  ? HGBA1C 5.7 (A) 12/22/2019  ? HGBA1C 5.4 03/24/2019  ? ?Lab Results  ?Component Value Date  ? INSULIN 10.5 11/17/2020  ? INSULIN 8.1 06/16/2020  ? INSULIN 10.2  03/24/2020  ? ?Lab Results  ?Component Value Date  ? TSH 3.210 03/24/2020  ? ?Lab Results  ?Component Value Date  ? CHOL 208 (H) 11/17/2020  ? HDL 57 11/17/2020  ? LDLCALC 130 (H) 11/17/2020  ? TRIG 119 11/17/2020  ? CHOLHDL 3.3 03/24/2019  ? ?Lab Results  ?Component Value Date  ? VD25OH 64.2 11/17/2020  ? VD25OH 68.6 06/16/2020  ? VD25OH 40.3 03/24/2020  ? ?Lab Results  ?Component Value Date  ? WBC 7.3 03/17/2021  ? HGB 13.0 03/17/2021  ? HCT 39.0 03/17/2021  ? MCV 97.0 03/17/2021  ? PLT 241 03/17/2021  ? ?Lab Results  ?Component Value Date  ? IRON 85 11/25/2015  ? TIBC 345 11/25/2015  ? FERRITIN 89 11/25/2015  ? ?Attestation Statements:  ? ?Reviewed by clinician on day of visit: allergies, medications, problem list, medical history, surgical history, family history, social history, and previous encounter notes. ? ? ?I, Trixie Dredge, am acting as transcriptionist for [name], MD. ? ?I have reviewed the above documentation for accuracy and completeness, and I agree with the above. -  [name], MD ? ? ?

## 2021-05-02 ENCOUNTER — Other Ambulatory Visit: Payer: Self-pay

## 2021-05-02 ENCOUNTER — Inpatient Hospital Stay: Payer: 59

## 2021-05-02 ENCOUNTER — Encounter: Payer: Self-pay | Admitting: Gynecologic Oncology

## 2021-05-02 ENCOUNTER — Inpatient Hospital Stay: Payer: 59 | Attending: Hematology and Oncology | Admitting: Gynecologic Oncology

## 2021-05-02 VITALS — BP 119/67 | HR 71 | Temp 97.8°F | Resp 18 | Ht 64.0 in | Wt 227.6 lb

## 2021-05-02 DIAGNOSIS — C541 Malignant neoplasm of endometrium: Secondary | ICD-10-CM

## 2021-05-02 DIAGNOSIS — Z90722 Acquired absence of ovaries, bilateral: Secondary | ICD-10-CM | POA: Insufficient documentation

## 2021-05-02 DIAGNOSIS — Z9221 Personal history of antineoplastic chemotherapy: Secondary | ICD-10-CM | POA: Diagnosis not present

## 2021-05-02 DIAGNOSIS — E669 Obesity, unspecified: Secondary | ICD-10-CM

## 2021-05-02 DIAGNOSIS — Z90710 Acquired absence of both cervix and uterus: Secondary | ICD-10-CM | POA: Insufficient documentation

## 2021-05-02 DIAGNOSIS — Z8542 Personal history of malignant neoplasm of other parts of uterus: Secondary | ICD-10-CM | POA: Insufficient documentation

## 2021-05-02 LAB — CMP (CANCER CENTER ONLY)
ALT: 22 U/L (ref 0–44)
AST: 26 U/L (ref 15–41)
Albumin: 4.3 g/dL (ref 3.5–5.0)
Alkaline Phosphatase: 108 U/L (ref 38–126)
Anion gap: 7 (ref 5–15)
BUN: 16 mg/dL (ref 8–23)
CO2: 29 mmol/L (ref 22–32)
Calcium: 9.6 mg/dL (ref 8.9–10.3)
Chloride: 104 mmol/L (ref 98–111)
Creatinine: 0.68 mg/dL (ref 0.44–1.00)
GFR, Estimated: >60 mL/min (ref >60)
Glucose, Bld: 94 mg/dL (ref 70–99)
Potassium: 4.4 mmol/L (ref 3.5–5.1)
Sodium: 140 mmol/L (ref 135–145)
Total Bilirubin: 0.4 mg/dL (ref 0.3–1.2)
Total Protein: 7.4 g/dL (ref 6.5–8.1)

## 2021-05-02 LAB — CBC WITH DIFFERENTIAL (CANCER CENTER ONLY)
Abs Immature Granulocytes: 0.02 10*3/uL (ref 0.00–0.07)
Basophils Absolute: 0 10*3/uL (ref 0.0–0.1)
Basophils Relative: 0 %
Eosinophils Absolute: 0.1 10*3/uL (ref 0.0–0.5)
Eosinophils Relative: 1 %
HCT: 38.4 % (ref 36.0–46.0)
Hemoglobin: 13.1 g/dL (ref 12.0–15.0)
Immature Granulocytes: 0 %
Lymphocytes Relative: 23 %
Lymphs Abs: 2.1 10*3/uL (ref 0.7–4.0)
MCH: 33 pg (ref 26.0–34.0)
MCHC: 34.1 g/dL (ref 30.0–36.0)
MCV: 96.7 fL (ref 80.0–100.0)
Monocytes Absolute: 0.6 10*3/uL (ref 0.1–1.0)
Monocytes Relative: 7 %
Neutro Abs: 6.1 10*3/uL (ref 1.7–7.7)
Neutrophils Relative %: 69 %
Platelet Count: 235 10*3/uL (ref 150–400)
RBC: 3.97 MIL/uL (ref 3.87–5.11)
RDW: 12.6 % (ref 11.5–15.5)
WBC Count: 8.9 10*3/uL (ref 4.0–10.5)
nRBC: 0 % (ref 0.0–0.2)

## 2021-05-02 MED ORDER — SODIUM CHLORIDE 0.9% FLUSH
10.0000 mL | Freq: Once | INTRAVENOUS | Status: AC
  Administered 2021-05-02: 10 mL

## 2021-05-02 MED ORDER — HEPARIN SOD (PORK) LOCK FLUSH 100 UNIT/ML IV SOLN
500.0000 [IU] | Freq: Once | INTRAVENOUS | Status: AC
  Administered 2021-05-02: 500 [IU]

## 2021-05-02 NOTE — Patient Instructions (Signed)
It was good to see you today.  I do not see or feel any evidence of cancer on your exam. ? ?I will plan to see you back in 6 months.  Please call sometime in August or September to get this appointment scheduled as my schedule is not out past the summer. ? ?You develop any new and concerning symptoms before that visit, please call to see me sooner. ?

## 2021-05-02 NOTE — Progress Notes (Signed)
Gynecologic Oncology Return Clinic Visit ? ?05/02/21 ? ?Reason for Visit: surveillance visit in the setting of uterine cancer ? ?Treatment History: ?Oncology History Overview Note  ?[facility] Stable ?Negative genetics ?  ?Endometrial cancer [facility])  ?07/28/2019 Pathology Results  ? 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 ?  ?08/01/2019 Imaging  ? CT abdomen and pelvis ?No evidence of abdominal or pelvic metastatic disease. ?  ?Small uterine fibroid. ?  ?Colonic diverticulosis, without radiographic evidence of diverticulitis. ?  ?Aortic Atherosclerosis (ICD10-I70.0). ?  ?08/14/2019 Surgery  ? Surgeon: Donaciano Eva  ?Operation: Robotic-assisted laparoscopic total hysterectomy with bilateral salpingoophorectomy, SLN biopsy  ?   ?Operative Findings:  : 8cm uterus which appeared grossly normal, normal appearing tubes and ovaries, no suspicious lymph nodes. Brisk oozing from skin incisions.  ?  ?09/03/2019 Cancer Staging  ? Staging form: Corpus Uteri - Carcinoma and Carcinosarcoma, AJCC 8th Edition ?- Pathologic stage from 09/03/2019: FIGO Stage IIIA (pT3a, pN0, cM0) - Signed by [name], MD on 09/03/2019 ? ?  ?09/04/2019 Pathology Results  ? FINAL MICROSCOPIC DIAGNOSIS:  ? ?A. LYMPH NODE, SENTINEL RIGHT OBTURATOR PROXIMAL, BIOPSY:  ?-  No carcinoma identified in one lymph node (0/1)  ?-  See comment  ? ?B. LYMPH NODE, SENTINEL RIGHT OBTURATOR DISTAL, BIOPSY:  ?-  No carcinoma identified in one lymph node (0/1)  ?-  See comment  ? ?C. LYMPH NODE, SENTINEL LEFT EXTERNAL ILIAC, BIOPSY:  ?-  No carcinoma identified in one lymph node (0/1)  ?-  See comment  ? ?D. UTERUS AND BILATERAL ADNEXA, HYSTERECTOMY WITH SALPINGO-OOPHORECTOMY:  ? ?Uterus:  ?-  Endometrioid carcinoma, FIGO grade 1  ?-  Leiomyoma (1.8 cm; largest)  ?-  See oncology table and comment below  ? ?Cervix:  ?-  Cervix with squamous metaplasia  ?-  No carcinoma identified   ? ?Bilateral Ovaries:  ?-  Endometrioid carcinoma involving right ovary  ?-  No carcinoma involving left ovary  ?-  Endosalpingiosis  ? ?Bilateral Fallopian tubes:  ?-  No carcinoma identified  ? ?ONCOLOGY TABLE:  ? ?UTERUS, CARCINOMA OR CARCINOSARCOMA  ? ?Procedure: Total hysterectomy and bilateral salpingo-oophorectomy  ?Histologic type: Endometrioid carcinoma, NOS  ?Histologic Grade: FIGO grade 1  ?Myometrial invasion:  ?     Depth of invasion: 3 mm  ?     Myometrial thickness: 20 mm  ?Uterine Serosa Involvement: Not identified  ?Cervical stromal involvement: Not identified  ?Extent of involvement of other organs: Right ovary  ?Lymphovascular invasion: Not identified  ?Regional Lymph Nodes:  ?     Examined:      3 Sentinel  ?                             0 non-sentinel  ?                             3 total  ?      Lymph nodes with metastasis: 0  ?      Isolated tumor cells (<0.2 mm): 0  ?      Micrometastasis:  (>0.2 mm and < 2.0 mm): 0  ?      Macrometastasis: (>2.0 mm): 0  ?      Extracapsular extension: N/A  ?Representative Tumor Block: D10  ?MMR / MSI testing: Pending  ?  Pathologic Stage Classification (pTNM, AJCC 8th edition):  pT3a, pN0  ?Comments: Pancytokeratin performed on the lymph nodes is negative. The right ovary has a focus of carcinoma.   ?  ?09/11/2019 Procedure  ? Placement of single lumen port a cath via right internal jugular vein. The catheter tip lies at the cavo-atrial junction. A power injectable port a cath was placed and is ready for immediate use. ?  ?  ?09/12/2019 - 12/26/2019 Chemotherapy  ? The patient had carboplatin and taxol for chemotherapy treatment.  ? ?  ?01/19/2020 Imaging  ? 1. Interval total hysterectomy with bilateral salpingo oophorectomy. ?No findings to suggest recurrent or metastatic disease. ?2. Tiny hiatal hernia. ?3. Left colonic diverticulosis without diverticulitis. ?4. Aortic Atherosclerosis (ICD10-I70.0). ?  ?02/18/2021 Imaging  ? 1. No evidence of metastatic  disease in the chest, abdomen or pelvis. No evidence of a left chest wall mass to correlate with reported palpable abnormality. ?2. Marked sigmoid diverticulosis. ?3. Small hiatal hernia. ?4. Aortic Atherosclerosis (ICD10-I70.0). ?  ?  ? ? ?Interval History: ?Last saw Dr. [name] on on 02/22/21.  Imaging that she had just before that visit showed no evidence of recurrent or metastatic disease.  Left chest wall mass that had been palpated is not seen on CT scan. ? ?Doing well today.  Continues to struggle with constipation, at her baseline.  Uses as needed medications with good relief.  Continues to have some urinary frequency, no recent change. ? ?Denies any vaginal bleeding or discharge. ? ?She continues to work with Dr. Leafy Ro at weight management.  Was recently on vacation, splurged a little bit, but is looking forward to getting back on her routine and working towards weight loss. ? ?Past Medical/Surgical History: ?Past Medical History:  ?Diagnosis Date  ? Anemia   ? Arthritis   ? Back pain   ? Bilateral swelling of feet   ? Breast cancer ([place])   ? at age 55  ? Constipation   ? Family history of breast cancer   ? Family history of colon cancer   ? Family history of prostate cancer in father   ? Family history of uterine cancer   ? History of radiation therapy   ? Joint pain   ? Lymph edema   ? right arm   ? Musculoskeletal pain   ? Other fatigue   ? PONV (postoperative nausea and vomiting)   ? Pre-diabetes   ? Shortness of breath on exertion   ? Sleep apnea   ? pt has nose pillows for cpap per pt - will bring DOS   ? Vertigo   ? ? ?Past Surgical History:  ?Procedure Laterality Date  ? BACK SURGERY    ? 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    ? BILATERAL TOTAL MASTECTOMY WITH AXILLARY LYMPH NODE DISSECTION Bilateral 2005  ? BREAST LUMPECTOMY  1994  ? right  ? CERVICAL FUSION  1995  ? C5-C7  ? IR IMAGING GUIDED PORT INSERTION  09/11/2019  ? MASTECTOMY  2005  ? Bilat   ? ROBOTIC ASSISTED TOTAL  HYSTERECTOMY WITH BILATERAL SALPINGO OOPHERECTOMY N/A 08/14/2019  ? Procedure: XI ROBOTIC ASSISTED TOTAL HYSTERECTOMY WITH BILATERAL SALPINGO OOPHORECTOMY;  Surgeon: [name], MD;  Location: [facility];  Service: Gynecology;  Laterality: N/A;  ? SENTINEL NODE BIOPSY N/A 08/14/2019  ? Procedure: SENTINEL NODE BIOPSY;  Surgeon: [name], MD;  Location: [facility];  Service: Gynecology;  Laterality: N/A;  ? WISDOM TOOTH EXTRACTION Bilateral 2004  ? ? ?  Family History  ?Problem Relation Age of Onset  ? Stroke Mother   ? Hyperlipidemia Mother   ? Skin cancer Mother   ? Breast cancer Mother 12  ?     ? recurrence at 29  ? Endometrial cancer Mother 87  ? Hypertension Mother   ? Cancer Mother   ? Obesity Mother   ? Diabetes Father   ? Hyperlipidemia Father   ? Heart attack Father 75  ? Colon cancer Father 11  ? Prostate cancer Father 65  ?     G[place]ason 9  ? Hypertension Father   ? Heart disease Father   ? Cancer Father   ? Sleep apnea Father   ? Alcoholism Father   ? Obesity Father   ? Diabetes Paternal Grandmother   ? Heart attack Paternal Grandmother   ? Rheum arthritis Paternal Grandmother   ? Hyperlipidemia Sister   ? Heart attack Paternal Grandfather   ? Heart attack Maternal Grandfather   ? Heart attack Maternal Grandmother   ? Skin cancer Maternal Grandmother   ? Rheum arthritis Maternal Grandmother   ? Breast cancer Sister 22  ? Lymphoma Maternal Aunt 65  ? Kidney cancer Maternal Aunt 60  ? Cervical cancer Cousin 61  ? ? ?Social History  ? ?Socioeconomic History  ? Marital status: Married  ?  Spouse name: [name]  ? Number of children: 0  ? Years of education: Not on file  ? Highest education level: Not on file  ?Occupational History  ? Occupation: Retired Engineer, structural  ?  Employer: [employer]  ?  Comment: [employer]  ?Tobacco Use  ? Smoking status: Former  ?  Types: Cigarettes  ?  Quit date: 10/31/2006  ?  Years since quitting: 14.5  ? Smokeless tobacco: Never  ?Vaping Use  ? Vaping Use: Never used   ?Substance and Sexual Activity  ? Alcohol use: Not Currently  ? Drug use: No  ? Sexual activity: Not Currently  ?  Partners: Female  ?  Birth control/protection: None  ?Other Topics Concern  ? Not on file  ?Social

## 2021-05-04 ENCOUNTER — Ambulatory Visit: Payer: 59 | Admitting: Family Medicine

## 2021-05-04 ENCOUNTER — Other Ambulatory Visit: Payer: 59

## 2021-05-11 ENCOUNTER — Ambulatory Visit: Payer: 59 | Admitting: Family Medicine

## 2021-05-16 ENCOUNTER — Encounter (INDEPENDENT_AMBULATORY_CARE_PROVIDER_SITE_OTHER): Payer: Self-pay | Admitting: Family Medicine

## 2021-05-16 ENCOUNTER — Ambulatory Visit (INDEPENDENT_AMBULATORY_CARE_PROVIDER_SITE_OTHER): Payer: 59 | Admitting: Family Medicine

## 2021-05-16 VITALS — BP 128/73 | HR 71 | Temp 97.9°F | Ht 64.0 in | Wt 225.0 lb

## 2021-05-16 DIAGNOSIS — Z9189 Other specified personal risk factors, not elsewhere classified: Secondary | ICD-10-CM

## 2021-05-16 DIAGNOSIS — E559 Vitamin D deficiency, unspecified: Secondary | ICD-10-CM

## 2021-05-16 DIAGNOSIS — E669 Obesity, unspecified: Secondary | ICD-10-CM

## 2021-05-16 DIAGNOSIS — R5383 Other fatigue: Secondary | ICD-10-CM | POA: Diagnosis not present

## 2021-05-16 DIAGNOSIS — Z6838 Body mass index (BMI) 38.0-38.9, adult: Secondary | ICD-10-CM

## 2021-05-16 DIAGNOSIS — E66813 Obesity, class 3: Secondary | ICD-10-CM

## 2021-05-16 MED ORDER — VITAMIN D (ERGOCALCIFEROL) 1.25 MG (50000 UNIT) PO CAPS: 50000.0000 [IU] | ORAL_CAPSULE | ORAL | 0 refills | Status: DC

## 2021-05-17 LAB — HEMOGLOBIN A1C
Est. average glucose Bld gHb Est-mCnc: 108 mg/dL
Hgb A1c MFr Bld: 5.4 % (ref 4.8–5.6)

## 2021-05-17 LAB — VITAMIN D 25 HYDROXY (VIT D DEFICIENCY, FRACTURES): Vit D, 25-Hydroxy: 54.5 ng/mL (ref 30.0–100.0)

## 2021-05-17 LAB — TSH: TSH: 2.77 u[IU]/mL (ref 0.450–4.500)

## 2021-05-26 NOTE — Progress Notes (Signed)
? ? ? ?Chief Complaint:  ? ?OBESITY ?Patricia Waters is here to discuss her progress with her obesity treatment plan along with follow-up of her obesity related diagnoses. Patricia Waters is on the Category 3 Plan, keeping a food journal and adhering to recommended goals of 400-550 calories and 40+ grams of protein at supper daily, or following a lower carbohydrate, vegetable and lean protein rich diet plan and states she is following her eating plan approximately 50% of the time. Patricia Waters states she is walking for 30 minutes 3-4 times per week. ? ?Today's visit was #: 53 ?Starting weight: 258 lbs ?Starting date: 03/24/2020 ?Today's weight: 225 lbs ?Today's date: 05/16/2021 ?Total lbs lost to date: 40 ?Total lbs lost since last in-office visit: 0 ? ?Interim History: Patricia Waters has been on vacation and then she has been dealing with her son in-law's sudden decline in health. She notes doing more stress and comfort eating. She has already started to get back on track. ? ?Subjective:  ? ?1. Vitamin D deficiency ?Al is on Vitamin D prescription. She is at high risk of over-replacement.  ? ?2. Other fatigue ?Patricia Waters has been dealing with a lot of family stress and she notes fatigue. She is due for labs.  ? ?3. At risk for heart disease ?Patricia Waters is at higher than average risk for cardiovascular disease due to obesity. ? ?Assessment/Plan:  ? ?1. Vitamin D deficiency ?We will check labs today, and we will refill prescription Vitamin D for 1 month. [place] will follow-up for routine testing of Vitamin D, at least 2-3 times per year to avoid over-replacement. ? ?- Vitamin D, Ergocalciferol, (DRISDOL) 1.25 MG (50000 UNIT) CAPS capsule; Take 1 capsule (50,000 Units total) by mouth every 7 (seven) days.  Dispense: 4 capsule; Refill: 0 ?- VITAMIN D 25 Hydroxy (Vit-D Deficiency, Fractures) ? ?2. Other fatigue ?We will check labs today, and we will follow up at Ivis's next visit.  ? ?- TSH ?- Hemoglobin A1c ? ?3. At risk for heart  disease ?Patricia Waters was given approximately 15 minutes of coronary artery disease prevention counseling today. She is 66 y.o. female and has risk factors for heart disease including obesity. We discussed intensive lifestyle modifications today with an emphasis on specific weight loss instructions and strategies. ? ?Repetitive spaced learning was employed today to elicit superior memory formation and behavioral change.  ? ?4. Obesity with current BMI 38.7 ?Patricia Waters is currently in the action stage of change. As such, her goal is to continue with weight loss efforts. She has agreed to the Category 3 Plan.  ? ?Exercise goals: As is. ? ?Behavioral modification strategies: increasing lean protein intake. ? ?Patricia Waters has agreed to follow-up with our clinic in 4 weeks. She was informed of the importance of frequent follow-up visits to maximize her success with intensive lifestyle modifications for her multiple health conditions.  ? ?Patricia Waters was informed we would discuss her lab results at her next visit unless there is a critical issue that needs to be addressed sooner. Patricia Waters agreed to keep her next visit at the agreed upon time to discuss these results. ? ?Objective:  ? ?Blood pressure 128/73, pulse 71, temperature 97.9 ?F (36.6 ?C), height '5\' 4"'$  (1.626 m), weight 225 lb (102.1 kg), SpO2 96 %. ?Body mass index is 38.62 kg/m?. ? ?General: Cooperative, alert, well developed, in no acute distress. ?HEENT: Conjunctivae and lids unremarkable. ?Cardiovascular: Regular rhythm.  ?Lungs: Normal work of breathing. ?Neurologic: No focal deficits.  ? ?Lab Results  ?Component Value Date  ?  CREATININE 0.68 05/02/2021  ? BUN 16 05/02/2021  ? NA 140 05/02/2021  ? K 4.4 05/02/2021  ? CL 104 05/02/2021  ? CO2 29 05/02/2021  ? ?Lab Results  ?Component Value Date  ? ALT 22 05/02/2021  ? AST 26 05/02/2021  ? ALKPHOS 108 05/02/2021  ? BILITOT 0.4 05/02/2021  ? ?Lab Results  ?Component Value Date  ? HGBA1C 5.4 05/16/2021  ? HGBA1C 5.6 11/17/2020   ? HGBA1C 5.5 06/16/2020  ? HGBA1C 4.9 03/24/2020  ? HGBA1C 5.7 (A) 12/22/2019  ? ?Lab Results  ?Component Value Date  ? INSULIN 10.5 11/17/2020  ? INSULIN 8.1 06/16/2020  ? INSULIN 10.2 03/24/2020  ? ?Lab Results  ?Component Value Date  ? TSH 2.770 05/16/2021  ? ?Lab Results  ?Component Value Date  ? CHOL 208 (H) 11/17/2020  ? HDL 57 11/17/2020  ? LDLCALC 130 (H) 11/17/2020  ? TRIG 119 11/17/2020  ? CHOLHDL 3.3 03/24/2019  ? ?Lab Results  ?Component Value Date  ? VD25OH 54.5 05/16/2021  ? VD25OH 64.2 11/17/2020  ? VD25OH 68.6 06/16/2020  ? ?Lab Results  ?Component Value Date  ? WBC 8.9 05/02/2021  ? HGB 13.1 05/02/2021  ? HCT 38.4 05/02/2021  ? MCV 96.7 05/02/2021  ? PLT 235 05/02/2021  ? ?Lab Results  ?Component Value Date  ? IRON 85 11/25/2015  ? TIBC 345 11/25/2015  ? FERRITIN 89 11/25/2015  ? ?Attestation Statements:  ? ?Reviewed by clinician on day of visit: allergies, medications, problem list, medical history, surgical history, family history, social history, and previous encounter notes. ? ? ?I, Trixie Dredge, am acting as transcriptionist for [name], MD. ? ?I have reviewed the above documentation for accuracy and completeness, and I agree with the above. -  [name], MD ? ? ?

## 2021-05-30 ENCOUNTER — Encounter: Payer: Self-pay | Admitting: Hematology and Oncology

## 2021-06-02 ENCOUNTER — Other Ambulatory Visit: Payer: Self-pay | Admitting: Family Medicine

## 2021-06-06 ENCOUNTER — Encounter: Payer: Self-pay | Admitting: Family Medicine

## 2021-06-06 ENCOUNTER — Encounter: Payer: Self-pay | Admitting: Hematology and Oncology

## 2021-06-06 ENCOUNTER — Ambulatory Visit (INDEPENDENT_AMBULATORY_CARE_PROVIDER_SITE_OTHER): Payer: Medicare Other | Admitting: Family Medicine

## 2021-06-06 VITALS — BP 118/58 | HR 67 | Ht 64.0 in | Wt 230.0 lb

## 2021-06-06 DIAGNOSIS — R7301 Impaired fasting glucose: Secondary | ICD-10-CM

## 2021-06-06 DIAGNOSIS — Z636 Dependent relative needing care at home: Secondary | ICD-10-CM

## 2021-06-06 DIAGNOSIS — E669 Obesity, unspecified: Secondary | ICD-10-CM | POA: Diagnosis not present

## 2021-06-06 NOTE — Assessment & Plan Note (Signed)
Cotinining to work with Dr. Leafy Ro. Had gained some weight back.  Really encouraged her to just continue to work on her walking even if she has to break it into smaller sessions.  I think that is been really helpful for her and also serves as a way to decompress and reduce stress levels. ?

## 2021-06-06 NOTE — Assessment & Plan Note (Signed)
Stable on current regimen   

## 2021-06-06 NOTE — Progress Notes (Signed)
? ?  Established Patient Office Visit ? ?Subjective   ?Patient ID: Patricia Waters, female    DOB: 1955/10/03  Age: 66 y.o. MRN: [id number] ? ?Chief Complaint  ?Patient presents with  ? Hypertension  ? ? ?HPI ? ?Impaired fasting glucose-no increased thirst or urination. No symptoms consistent with hypoglycemia. ? ?Follow-up mood-she is currently on Cymbalta and is doing well overall.  She feels like she is happy with her current dose and regimen.  She just had a lot of stressors recently.  Her wife of 10 years has been having some concerning medical problems.  She just had foot surgery and had a pin placed and then fell.  She was placed on steroids and its been causing significant swings in her blood glucose.  She is also the primary caretaker for her mother who lives with her.  She also recently had a family member who ended up hospitalized and was passed away.  So the really just has been a lot of stressful things going on and she has been finding herself stress eating. ? ? ? ?ROS ? ?  ?Objective:  ?  ? ?BP (!) 118/58   Pulse 67   Ht '5\' 4"'$  (1.626 m)   Wt 230 lb (104.3 kg)   SpO2 98%   BMI 39.48 kg/m?  ? ? ?Physical Exam ?Vitals and nursing note reviewed.  ?Constitutional:   ?   Appearance: She is well-developed.  ?HENT:  ?   Head: Normocephalic and atraumatic.  ?Cardiovascular:  ?   Rate and Rhythm: Normal rate and regular rhythm.  ?   Heart sounds: Normal heart sounds.  ?Pulmonary:  ?   Effort: Pulmonary effort is normal.  ?   Breath sounds: Normal breath sounds.  ?Skin: ?   General: Skin is warm and dry.  ?Neurological:  ?   Mental Status: She is alert and oriented to person, place, and time.  ?Psychiatric:     ?   Behavior: Behavior normal.  ? ? ? ?No results found for any visits on 06/06/21. ? ? ? ?The 10-year ASCVD risk score (Arnett DK, et al., 2019) is: 4.3% ? ?  ?Assessment & Plan:  ? ?Problem List Items Addressed This Visit   ? ?  ? Endocrine  ? IFG (impaired fasting glucose) - Primary  ?  Well  controlled. Continue current regimen. Follow up in  6 mo  ? ?  ?  ?  ? Other  ? Obesity, Class II, BMI 35-39.9  ?  Cotinining to work with Dr. Leafy Ro. Had gained some weight back.  Really encouraged her to just continue to work on her walking even if she has to break it into smaller sessions.  I think that is been really helpful for her and also serves as a way to decompress and reduce stress levels. ? ?  ?  ? Caregiver stress  ?  Stable on current regimen.   ? ?  ?  ? ? ?Labs are up to date.   ? ?Return in about 6 months (around 12/07/2021) for Pre-diabetes.  ? ? ?Beatrice Lecher, MD ? ?

## 2021-06-06 NOTE — Assessment & Plan Note (Signed)
Well controlled. Continue current regimen. Follow up in  6 mo  

## 2021-06-07 ENCOUNTER — Encounter: Payer: Self-pay | Admitting: Hematology and Oncology

## 2021-06-14 ENCOUNTER — Ambulatory Visit (INDEPENDENT_AMBULATORY_CARE_PROVIDER_SITE_OTHER): Payer: Medicare Other | Admitting: Family Medicine

## 2021-06-14 ENCOUNTER — Encounter (INDEPENDENT_AMBULATORY_CARE_PROVIDER_SITE_OTHER): Payer: Self-pay | Admitting: Family Medicine

## 2021-06-14 VITALS — BP 127/79 | HR 64 | Temp 97.9°F | Ht 64.0 in | Wt 228.0 lb

## 2021-06-14 DIAGNOSIS — E669 Obesity, unspecified: Secondary | ICD-10-CM

## 2021-06-14 DIAGNOSIS — M722 Plantar fascial fibromatosis: Secondary | ICD-10-CM | POA: Diagnosis not present

## 2021-06-14 DIAGNOSIS — Z6839 Body mass index (BMI) 39.0-39.9, adult: Secondary | ICD-10-CM

## 2021-06-14 DIAGNOSIS — E559 Vitamin D deficiency, unspecified: Secondary | ICD-10-CM

## 2021-06-14 MED ORDER — VITAMIN D (ERGOCALCIFEROL) 1.25 MG (50000 UNIT) PO CAPS: 50000.0000 [IU] | ORAL_CAPSULE | ORAL | 0 refills | Status: DC

## 2021-06-15 ENCOUNTER — Ambulatory Visit (INDEPENDENT_AMBULATORY_CARE_PROVIDER_SITE_OTHER): Payer: Medicare Other

## 2021-06-15 ENCOUNTER — Ambulatory Visit (INDEPENDENT_AMBULATORY_CARE_PROVIDER_SITE_OTHER): Payer: Medicare Other | Admitting: Sports Medicine

## 2021-06-15 DIAGNOSIS — M19072 Primary osteoarthritis, left ankle and foot: Secondary | ICD-10-CM

## 2021-06-15 DIAGNOSIS — M722 Plantar fascial fibromatosis: Secondary | ICD-10-CM | POA: Diagnosis not present

## 2021-06-15 DIAGNOSIS — M7732 Calcaneal spur, left foot: Secondary | ICD-10-CM | POA: Diagnosis not present

## 2021-06-15 NOTE — Assessment & Plan Note (Signed)
This is a very pleasant 66 year old female, she has long history of left ankle pain localized to the left sinus tarsi, moderate gelling. ?She has tried oral NSAIDs, analgesics without improvement. ?She tried ASO bracing, no improvement. ?Today we did a ankle joint injection, I would like some baseline x-rays, return to see me in 4 to 6 weeks. ?

## 2021-06-15 NOTE — Progress Notes (Signed)
? ? ?  Procedures performed today:   ? ?Procedure: Real-time Ultrasound Guided injection of the left ankle joint ?Device: Samsung [id number]  ?Verbal informed consent obtained.  ?Time-out conducted.  ?Noted no overlying erythema, induration, or other signs of local infection.  ?Skin prepped in a sterile fashion.  ?Local anesthesia: Topical Ethyl chloride.  ?With sterile technique and under real time ultrasound guidance: Noted arthritic joint, 1 cc Kenalog 40, 1 cc lidocaine,1 cc bupivacaine injected easily ?Completed without difficulty  ?Advised to call if fevers/chills, erythema, induration, drainage, or persistent bleeding.  ?Images permanently stored and available for review in PACS.  ?Impression: Technically successful ultrasound guided injection. ? ?Independent interpretation of notes and tests performed by another provider:  ? ?None. ? ?Brief History, Exam, Impression, and Recommendations:   ? ?Primary osteoarthritis of left ankle ?This is a very pleasant 66 year old female, she has long history of left ankle pain localized to the left sinus tarsi, moderate gelling. ?She has tried oral NSAIDs, analgesics without improvement. ?She tried ASO bracing, no improvement. ?Today we did a ankle joint injection, I would like some baseline x-rays, return to see me in 4 to 6 weeks. ? ? ? ?___________________________________________ ?Gwen Her. Dianah Field, M.D., ABFM., CAQSM. ?Primary Care and Sports Medicine ?[place] ? ?Adjunct Instructor of Family Medicine  ?[employer] ?

## 2021-06-28 NOTE — Progress Notes (Signed)
Chief Complaint:   OBESITY Patricia Waters is here to discuss her progress with her obesity treatment plan along with follow-up of her obesity related diagnoses. Patricia Waters is on the Category 3 Plan and states she is following her eating plan approximately 30% of the time. Patricia Waters states she is doing 0 minutes 0 times per week.  Today's visit was #: 18 Starting weight: 258 lbs Starting date: 03/24/2020 Today's weight: 228 lbs Today's date: 06/14/2021 Total lbs lost to date: 30 Total lbs lost since last in-office visit: 0  Interim History: Patricia Waters has struggled more with meal planning, and she has to eat out more. She is open to looking at other eating plan.   Subjective:   1. Vitamin D deficiency Patricia Waters is on Vitamin D with no side effects noted. She requests a refill.   2. 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 She will be seeing her foot specialist soon.   Assessment/Plan:   1. Vitamin D deficiency We will refill prescription Vitamin D for 1 month. Patricia Waters will follow-up for routine testing of Vitamin D, at least 2-3 times per year to avoid over-replacement.  - Vitamin D, Ergocalciferol, (DRISDOL) 1.25 MG (50000 UNIT) CAPS capsule; Take 1 capsule (50,000 Units total) by mouth every 7 (seven) days.  Dispense: 4 capsule; Refill: 0  2. Plantar fasciitis, bilateral Patricia Waters was advised that weight loss will help decrease foot pain and arch support to help decrease symptoms.   3. Obesity, Current BMI 39.2 Patricia Waters is currently in the action stage of change. As such, her goal is to continue with weight loss efforts. She has agreed to change to following a lower carbohydrate, vegetable and lean protein rich diet plan.   Behavioral modification strategies: increasing lean protein intake and increasing water intake.  Patricia Waters has agreed to follow-up with our clinic in 4 weeks. She was informed of the importance of frequent follow-up visits to  maximize her success with intensive lifestyle modifications for her multiple health conditions.   Objective:   Blood pressure 127/79, pulse 64, temperature 97.9 F (36.6 C), height '5\' 4"'$  (1.626 m), weight 228 lb (103.4 kg), SpO2 97 %. Body mass index is 39.14 kg/m.  General: Cooperative, alert, well developed, in no acute distress. HEENT: Conjunctivae and lids unremarkable. Cardiovascular: Regular rhythm.  Lungs: Normal work of breathing. Neurologic: No focal deficits.   Lab Results  Component Value Date   CREATININE 0.68 05/02/2021   BUN 16 05/02/2021   NA 140 05/02/2021   K 4.4 05/02/2021   CL 104 05/02/2021   CO2 29 05/02/2021   Lab Results  Component Value Date   ALT 22 05/02/2021   AST 26 05/02/2021   ALKPHOS 108 05/02/2021   BILITOT 0.4 05/02/2021   Lab Results  Component Value Date   HGBA1C 5.4 05/16/2021   HGBA1C 5.6 11/17/2020   HGBA1C 5.5 06/16/2020   HGBA1C 4.9 03/24/2020   HGBA1C 5.7 (A) 12/22/2019   Lab Results  Component Value Date   INSULIN 10.5 11/17/2020   INSULIN 8.1 06/16/2020   INSULIN 10.2 03/24/2020   Lab Results  Component Value Date   TSH 2.770 05/16/2021   Lab Results  Component Value Date   CHOL 208 (H) 11/17/2020   HDL 57 11/17/2020   LDLCALC 130 (H) 11/17/2020   TRIG 119 11/17/2020   CHOLHDL 3.3 03/24/2019   Lab Results  Component Value Date   VD25OH 54.5 05/16/2021   VD25OH 64.2 11/17/2020  VD25OH 68.6 06/16/2020   Lab Results  Component Value Date   WBC 8.9 05/02/2021   HGB 13.1 05/02/2021   HCT 38.4 05/02/2021   MCV 96.7 05/02/2021   PLT 235 05/02/2021   Lab Results  Component Value Date   IRON 85 11/25/2015   TIBC 345 11/25/2015   FERRITIN 89 11/25/2015    Obesity Behavioral Intervention:   Approximately 15 minutes were spent on the discussion below.  ASK: We discussed the diagnosis of obesity with Patricia Waters today and Patricia Waters agreed to give [name] permission to discuss obesity behavioral modification therapy  today.  ASSESS: Patricia Waters has the diagnosis of obesity and her BMI today is 39.2. Patricia Waters is in the action stage of change.   ADVISE: Patricia Waters was educated on the multiple health risks of obesity as well as the benefit of weight loss to improve her health. She was advised of the need for long term treatment and the importance of lifestyle modifications to improve her current health and to decrease her risk of future health problems.  AGREE: Multiple dietary modification options and treatment options were discussed and Patricia Waters agreed to follow the recommendations documented in the above note.  ARRANGE: Patricia Waters was educated on the importance of frequent visits to treat obesity as outlined per CMS and USPSTF guidelines and agreed to schedule her next follow up appointment today.  Attestation Statements:   Reviewed by clinician on day of visit: allergies, medications, problem list, medical history, surgical history, family history, social history, and previous encounter notes.   I, Trixie Dredge, am acting as transcriptionist for [name], MD.  I have reviewed the above documentation for accuracy and completeness, and I agree with the above. -  [name], MD

## 2021-07-11 DIAGNOSIS — L821 Other seborrheic keratosis: Secondary | ICD-10-CM | POA: Diagnosis not present

## 2021-07-11 DIAGNOSIS — D225 Melanocytic nevi of trunk: Secondary | ICD-10-CM | POA: Diagnosis not present

## 2021-07-11 DIAGNOSIS — D1801 Hemangioma of skin and subcutaneous tissue: Secondary | ICD-10-CM | POA: Diagnosis not present

## 2021-07-11 DIAGNOSIS — Z85828 Personal history of other malignant neoplasm of skin: Secondary | ICD-10-CM | POA: Diagnosis not present

## 2021-07-11 DIAGNOSIS — L820 Inflamed seborrheic keratosis: Secondary | ICD-10-CM | POA: Diagnosis not present

## 2021-07-13 ENCOUNTER — Ambulatory Visit (INDEPENDENT_AMBULATORY_CARE_PROVIDER_SITE_OTHER): Payer: Medicare Other | Admitting: Family Medicine

## 2021-07-13 ENCOUNTER — Encounter: Payer: Self-pay | Admitting: Hematology and Oncology

## 2021-07-13 ENCOUNTER — Encounter (INDEPENDENT_AMBULATORY_CARE_PROVIDER_SITE_OTHER): Payer: Self-pay | Admitting: Family Medicine

## 2021-07-13 ENCOUNTER — Inpatient Hospital Stay: Payer: Medicare Other | Attending: Hematology and Oncology

## 2021-07-13 ENCOUNTER — Other Ambulatory Visit: Payer: Self-pay

## 2021-07-13 VITALS — BP 119/79 | HR 67 | Temp 98.4°F | Ht 64.0 in | Wt 226.0 lb

## 2021-07-13 DIAGNOSIS — E669 Obesity, unspecified: Secondary | ICD-10-CM

## 2021-07-13 DIAGNOSIS — E559 Vitamin D deficiency, unspecified: Secondary | ICD-10-CM

## 2021-07-13 DIAGNOSIS — Z6838 Body mass index (BMI) 38.0-38.9, adult: Secondary | ICD-10-CM | POA: Diagnosis not present

## 2021-07-13 DIAGNOSIS — C541 Malignant neoplasm of endometrium: Secondary | ICD-10-CM | POA: Insufficient documentation

## 2021-07-13 DIAGNOSIS — R7303 Prediabetes: Secondary | ICD-10-CM

## 2021-07-13 DIAGNOSIS — Z6841 Body Mass Index (BMI) 40.0 and over, adult: Secondary | ICD-10-CM

## 2021-07-13 LAB — CMP (CANCER CENTER ONLY)
ALT: 15 U/L (ref 0–44)
AST: 19 U/L (ref 15–41)
Albumin: 4.4 g/dL (ref 3.5–5.0)
Alkaline Phosphatase: 114 U/L (ref 38–126)
Anion gap: 6 (ref 5–15)
BUN: 13 mg/dL (ref 8–23)
CO2: 29 mmol/L (ref 22–32)
Calcium: 10 mg/dL (ref 8.9–10.3)
Chloride: 105 mmol/L (ref 98–111)
Creatinine: 0.76 mg/dL (ref 0.44–1.00)
GFR, Estimated: >60 mL/min (ref >60)
Glucose, Bld: 124 mg/dL — ABNORMAL HIGH (ref 70–99)
Potassium: 4.2 mmol/L (ref 3.5–5.1)
Sodium: 140 mmol/L (ref 135–145)
Total Bilirubin: 0.4 mg/dL (ref 0.3–1.2)
Total Protein: 7.5 g/dL (ref 6.5–8.1)

## 2021-07-13 LAB — CBC WITH DIFFERENTIAL (CANCER CENTER ONLY)
Abs Immature Granulocytes: 0.02 10*3/uL (ref 0.00–0.07)
Basophils Absolute: 0 10*3/uL (ref 0.0–0.1)
Basophils Relative: 1 %
Eosinophils Absolute: 0.1 10*3/uL (ref 0.0–0.5)
Eosinophils Relative: 1 %
HCT: 40 % (ref 36.0–46.0)
Hemoglobin: 13.5 g/dL (ref 12.0–15.0)
Immature Granulocytes: 0 %
Lymphocytes Relative: 26 %
Lymphs Abs: 1.7 10*3/uL (ref 0.7–4.0)
MCH: 32.8 pg (ref 26.0–34.0)
MCHC: 33.8 g/dL (ref 30.0–36.0)
MCV: 97.1 fL (ref 80.0–100.0)
Monocytes Absolute: 0.5 10*3/uL (ref 0.1–1.0)
Monocytes Relative: 8 %
Neutro Abs: 4.1 10*3/uL (ref 1.7–7.7)
Neutrophils Relative %: 64 %
Platelet Count: 246 10*3/uL (ref 150–400)
RBC: 4.12 MIL/uL (ref 3.87–5.11)
RDW: 12.1 % (ref 11.5–15.5)
WBC Count: 6.5 10*3/uL (ref 4.0–10.5)
nRBC: 0 % (ref 0.0–0.2)

## 2021-07-13 MED ORDER — VITAMIN D (ERGOCALCIFEROL) 1.25 MG (50000 UNIT) PO CAPS: 50000.0000 [IU] | ORAL_CAPSULE | ORAL | 0 refills | Status: DC

## 2021-07-13 MED ORDER — HEPARIN SOD (PORK) LOCK FLUSH 100 UNIT/ML IV SOLN
500.0000 [IU] | Freq: Once | INTRAVENOUS | Status: AC
  Administered 2021-07-13: 500 [IU]

## 2021-07-13 MED ORDER — SODIUM CHLORIDE 0.9% FLUSH
10.0000 mL | Freq: Once | INTRAVENOUS | Status: AC
  Administered 2021-07-13: 10 mL

## 2021-07-13 NOTE — Progress Notes (Signed)
Chief Complaint:   OBESITY Patricia Waters is here to discuss her progress with her obesity treatment plan along with follow-up of her obesity related diagnoses. Patricia Waters is on following a lower carbohydrate, vegetable and lean protein rich diet plan and states she is following her eating plan approximately 50% of the time. Patricia Waters states she is active while doing yard work, varies minutes varies times per week.  Today's visit was #: 64 Starting weight: 258 lbs Starting date: 03/24/2020 Today's weight: 226 lbs Today's date: 07/13/2021 Total lbs lost to date: 32 Total lbs lost since last in-office visit: 2  Interim History: Patricia Waters continues to do well with weight loss.  Her hunger is controlled and she is doing well with decreasing simple carbohydrates.  She will be going on vacation soon.  Subjective:   1. Pre-diabetes Patricia Waters is doing very well with decreasing simple carbohydrates in her diet.  She denies signs of hypoglycemia.  2. Vitamin D deficiency Patricia Waters is on vitamin D, with no side effects noted.  She requests a refill today.  Assessment/Plan:   1. Pre-diabetes Patricia Waters will continue with her diet and exercise, and we will recheck labs in 1 month.  2. Vitamin D deficiency Patricia Waters will continue prescription vitamin D 50,000 units weekly, and we will refill for 1 month.  We will recheck labs in 1 month.  - Vitamin D, Ergocalciferol, (DRISDOL) 1.25 MG (50000 UNIT) CAPS capsule; Take 1 capsule (50,000 Units total) by mouth every 7 (seven) days.  Dispense: 4 capsule; Refill: 0  3. Obesity with current BMI of 38.9 Patricia Waters is currently in the action stage of change. As such, her goal is to continue with weight loss efforts. She has agreed to following a lower carbohydrate, vegetable and lean protein rich diet plan.   Exercise goals: As is.  Behavioral modification strategies: increasing lean protein intake, travel eating strategies, and celebration eating strategies.  Patricia Waters has  agreed to follow-up with our clinic in 3 to 4 weeks. She was informed of the importance of frequent follow-up visits to maximize her success with intensive lifestyle modifications for her multiple health conditions.   Objective:   Blood pressure 119/79, pulse 67, temperature 98.4 F (36.9 C), height '5\' 4"'$  (1.626 m), weight 226 lb (102.5 kg), SpO2 97 %. Body mass index is 38.79 kg/m.  General: Cooperative, alert, well developed, in no acute distress. HEENT: Conjunctivae and lids unremarkable. Cardiovascular: Regular rhythm.  Lungs: Normal work of breathing. Neurologic: No focal deficits.   Lab Results  Component Value Date   CREATININE 0.76 07/13/2021   BUN 13 07/13/2021   NA 140 07/13/2021   K 4.2 07/13/2021   CL 105 07/13/2021   CO2 29 07/13/2021   Lab Results  Component Value Date   ALT 15 07/13/2021   AST 19 07/13/2021   ALKPHOS 114 07/13/2021   BILITOT 0.4 07/13/2021   Lab Results  Component Value Date   HGBA1C 5.4 05/16/2021   HGBA1C 5.6 11/17/2020   HGBA1C 5.5 06/16/2020   HGBA1C 4.9 03/24/2020   HGBA1C 5.7 (A) 12/22/2019   Lab Results  Component Value Date   INSULIN 10.5 11/17/2020   INSULIN 8.1 06/16/2020   INSULIN 10.2 03/24/2020   Lab Results  Component Value Date   TSH 2.770 05/16/2021   Lab Results  Component Value Date   CHOL 208 (H) 11/17/2020   HDL 57 11/17/2020   LDLCALC 130 (H) 11/17/2020   TRIG 119 11/17/2020   CHOLHDL 3.3 03/24/2019  Lab Results  Component Value Date   VD25OH 54.5 05/16/2021   VD25OH 64.2 11/17/2020   VD25OH 68.6 06/16/2020   Lab Results  Component Value Date   WBC 6.5 07/13/2021   HGB 13.5 07/13/2021   HCT 40.0 07/13/2021   MCV 97.1 07/13/2021   PLT 246 07/13/2021   Lab Results  Component Value Date   IRON 85 11/25/2015   TIBC 345 11/25/2015   FERRITIN 89 11/25/2015   Attestation Statements:   Reviewed by clinician on day of visit: allergies, medications, problem list, medical history, surgical  history, family history, social history, and previous encounter notes.   I, Trixie Dredge, am acting as transcriptionist for [name], MD.  I have reviewed the above documentation for accuracy and completeness, and I agree with the above. -  [name], MD

## 2021-07-26 ENCOUNTER — Ambulatory Visit (INDEPENDENT_AMBULATORY_CARE_PROVIDER_SITE_OTHER): Payer: Medicare Other | Admitting: Sports Medicine

## 2021-07-26 DIAGNOSIS — M19072 Primary osteoarthritis, left ankle and foot: Secondary | ICD-10-CM | POA: Diagnosis not present

## 2021-07-26 NOTE — Assessment & Plan Note (Signed)
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 She had tried oral analgesics, ASO bracing, nothing was working, I injected her left ankle and she returns today feeling significantly better. Return to see me as needed.

## 2021-07-26 NOTE — Progress Notes (Signed)
    Procedures performed today:    None.  Independent interpretation of notes and tests performed by another provider:   None.  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 She had tried oral analgesics, ASO bracing, nothing was working, I injected her left ankle and she returns today feeling significantly better. Return to see me as needed.    ____________________________________________ Ihor Austin. Benjamin Stain, M.D., ABFM., CAQSM., AME. Primary Care and Sports Medicine [facility]  Adjunct Professor of Family Medicine  [employer]  Restaurant manager, fast food

## 2021-08-10 ENCOUNTER — Ambulatory Visit (INDEPENDENT_AMBULATORY_CARE_PROVIDER_SITE_OTHER): Payer: Medicare Other | Admitting: Family Medicine

## 2021-08-10 ENCOUNTER — Encounter (INDEPENDENT_AMBULATORY_CARE_PROVIDER_SITE_OTHER): Payer: Self-pay | Admitting: Family Medicine

## 2021-08-10 VITALS — BP 113/76 | HR 49 | Temp 98.0°F | Ht 64.0 in | Wt 230.0 lb

## 2021-08-10 DIAGNOSIS — E669 Obesity, unspecified: Secondary | ICD-10-CM

## 2021-08-10 DIAGNOSIS — E162 Hypoglycemia, unspecified: Secondary | ICD-10-CM | POA: Insufficient documentation

## 2021-08-10 DIAGNOSIS — Z6839 Body mass index (BMI) 39.0-39.9, adult: Secondary | ICD-10-CM | POA: Diagnosis not present

## 2021-08-10 DIAGNOSIS — E559 Vitamin D deficiency, unspecified: Secondary | ICD-10-CM

## 2021-08-10 MED ORDER — VITAMIN D (ERGOCALCIFEROL) 1.25 MG (50000 UNIT) PO CAPS: 50000.0000 [IU] | ORAL_CAPSULE | ORAL | 0 refills | Status: DC

## 2021-08-15 NOTE — Progress Notes (Signed)
Chief Complaint:   OBESITY Patricia Waters is here to discuss her progress with her obesity treatment plan along with follow-up of her obesity related diagnoses. Patricia Waters is on following a lower carbohydrate, vegetable and lean protein rich diet plan and states she is following her eating plan approximately 50% of the time. Patricia Waters states she is active while gardening, and walking for 60 minutes 1 time per week.  Today's visit was #: 20 Starting weight: 258 lbs Starting date: 03/24/2020 Today's weight: 230 lbs Today's date: 08/10/2021 Total lbs lost to date: 28 Total lbs lost since last in-office visit: 0  Interim History: [name] has been on vacation and she did some celebration eating. She did well with meeting her protein goals and she is working on getting back on track. She has been very active with home projects lately.   Subjective:   1. Hypoglycemia Tamma is working on her diet and exercise, and she is starting a low carbohydrate plan.   2. Vitamin D deficiency Patricia Waters is stable on Vitamin D, and she requests a refill.   Assessment/Plan:   1. Hypoglycemia Patricia Waters will continue to decrease simple carbohydrates, and we will plan to recheck labs in 1-2 months.   2. Vitamin D deficiency We will refill prescription Vitamin D 50,000 IU every week for 1 month. Mylo will follow-up for routine testing of Vitamin D, at least 2-3 times per year to avoid over-replacement.  - Vitamin D, Ergocalciferol, (DRISDOL) 1.25 MG (50000 UNIT) CAPS capsule; Take 1 capsule (50,000 Units total) by mouth every 7 (seven) days.  Dispense: 4 capsule; Refill: 0  3. Obesity, Current BMI 39.6 Patricia Waters is currently in the action stage of change. As such, her goal is to continue with weight loss efforts. She has agreed to following a lower carbohydrate, vegetable and lean protein rich diet plan.   Exercise goals: For substantial health benefits, adults should do at least 150 minutes (2 hours and 30  minutes) a week of moderate-intensity, or 75 minutes (1 hour and 15 minutes) a week of vigorous-intensity aerobic physical activity, or an equivalent combination of moderate- and vigorous-intensity aerobic activity. Aerobic activity should be performed in episodes of at least 10 minutes, and preferably, it should be spread throughout the week.  Behavioral modification strategies: increasing lean protein intake and increasing water intake.  Patricia Waters has agreed to follow-up with our clinic in 4 weeks. She was informed of the importance of frequent follow-up visits to maximize her success with intensive lifestyle modifications for her multiple health conditions.   Objective:   Blood pressure 113/76, pulse (!) 49, temperature 98 F (36.7 C), height '5\' 4"'$  (1.626 m), weight 230 lb (104.3 kg), SpO2 96 %. Body mass index is 39.48 kg/m.  General: Cooperative, alert, well developed, in no acute distress. HEENT: Conjunctivae and lids unremarkable. Cardiovascular: Regular rhythm.  Lungs: Normal work of breathing. Neurologic: No focal deficits.   Lab Results  Component Value Date   CREATININE 0.76 07/13/2021   BUN 13 07/13/2021   NA 140 07/13/2021   K 4.2 07/13/2021   CL 105 07/13/2021   CO2 29 07/13/2021   Lab Results  Component Value Date   ALT 15 07/13/2021   AST 19 07/13/2021   ALKPHOS 114 07/13/2021   BILITOT 0.4 07/13/2021   Lab Results  Component Value Date   HGBA1C 5.4 05/16/2021   HGBA1C 5.6 11/17/2020   HGBA1C 5.5 06/16/2020   HGBA1C 4.9 03/24/2020   HGBA1C 5.7 (A) 12/22/2019  Lab Results  Component Value Date   INSULIN 10.5 11/17/2020   INSULIN 8.1 06/16/2020   INSULIN 10.2 03/24/2020   Lab Results  Component Value Date   TSH 2.770 05/16/2021   Lab Results  Component Value Date   CHOL 208 (H) 11/17/2020   HDL 57 11/17/2020   LDLCALC 130 (H) 11/17/2020   TRIG 119 11/17/2020   CHOLHDL 3.3 03/24/2019   Lab Results  Component Value Date   VD25OH 54.5  05/16/2021   VD25OH 64.2 11/17/2020   VD25OH 68.6 06/16/2020   Lab Results  Component Value Date   WBC 6.5 07/13/2021   HGB 13.5 07/13/2021   HCT 40.0 07/13/2021   MCV 97.1 07/13/2021   PLT 246 07/13/2021   Lab Results  Component Value Date   IRON 85 11/25/2015   TIBC 345 11/25/2015   FERRITIN 89 11/25/2015    Obesity Behavioral Intervention:   Approximately 15 minutes were spent on the discussion below.  ASK: We discussed the diagnosis of obesity with Patricia Waters today and Patricia Waters agreed to give Patricia Waters permission to discuss obesity behavioral modification therapy today.  ASSESS: Patricia Waters has the diagnosis of obesity and her BMI today is 39.6. Patricia Waters is in the action stage of change.   ADVISE: Patricia Waters was educated on the multiple health risks of obesity as well as the benefit of weight loss to improve her health. She was advised of the need for long term treatment and the importance of lifestyle modifications to improve her current health and to decrease her risk of future health problems.  AGREE: Multiple dietary modification options and treatment options were discussed and Patricia Waters agreed to follow the recommendations documented in the above note.  ARRANGE: Patricia Waters was educated on the importance of frequent visits to treat obesity as outlined per CMS and USPSTF guidelines and agreed to schedule her next follow up appointment today.  Attestation Statements:   Reviewed by clinician on day of visit: allergies, medications, problem list, medical history, surgical history, family history, social history, and previous encounter notes.   I, Patricia Waters, am acting as transcriptionist for Patricia Nip, MD.  I have reviewed the above documentation for accuracy and completeness, and I agree with the above. -  Patricia Nip, MD

## 2021-08-18 DIAGNOSIS — G4733 Obstructive sleep apnea (adult) (pediatric): Secondary | ICD-10-CM | POA: Diagnosis not present

## 2021-08-22 ENCOUNTER — Other Ambulatory Visit: Payer: Self-pay

## 2021-08-22 ENCOUNTER — Encounter: Payer: Self-pay | Admitting: Hematology and Oncology

## 2021-08-22 ENCOUNTER — Inpatient Hospital Stay: Payer: Medicare Other | Attending: Hematology and Oncology | Admitting: Hematology and Oncology

## 2021-08-22 VITALS — BP 118/60 | HR 63 | Temp 97.8°F | Resp 18 | Ht 64.0 in | Wt 231.0 lb

## 2021-08-22 DIAGNOSIS — C541 Malignant neoplasm of endometrium: Secondary | ICD-10-CM | POA: Insufficient documentation

## 2021-08-22 DIAGNOSIS — Z6839 Body mass index (BMI) 39.0-39.9, adult: Secondary | ICD-10-CM | POA: Insufficient documentation

## 2021-08-22 DIAGNOSIS — Z79899 Other long term (current) drug therapy: Secondary | ICD-10-CM | POA: Insufficient documentation

## 2021-08-22 DIAGNOSIS — E669 Obesity, unspecified: Secondary | ICD-10-CM | POA: Diagnosis not present

## 2021-08-22 NOTE — Progress Notes (Signed)
[place] OFFICE PROGRESS NOTE  Patient Care Team: Hali Marry, MD as PCP - General (Family Medicine) Druscilla Brownie, MD (Dermatology) Kristeen Miss, MD (Neurosurgery) Guss Bunde, MD (Obstetrics and Gynecology)  ASSESSMENT & PLAN:  Endometrial cancer [facility]) The patient is completely asymptomatic Her last CT imaging showed no evidence of disease I plan to see her again around November for repeat imaging study If she have no signs of disease, we will get her port removed We will continue port maintain and flushes and blood work  Obesity, Class II, BMI 35-39.9 We had extensive discussions in the past regarding the role of weight loss as a preventive strategy to reduce risk of breast cancer and endometrial cancer recurrence She appears motivated to work hard on dietary changes and exercise  Orders Placed This Encounter  Procedures   CT ABDOMEN PELVIS W CONTRAST    Standing Status:   Future    Standing Expiration Date:   08/23/2022    Order Specific Question:   If indicated for the ordered procedure, I authorize the administration of contrast media per Radiology protocol    Answer:   Yes    Order Specific Question:   Preferred imaging location?    Answer:   [facility]    Order Specific Question:   Radiology Contrast Protocol - do NOT remove file path    Answer:   [email or web address]    All questions were answered. The patient knows to call the clinic with any problems, questions or concerns. The total time spent in the appointment was 20 minutes encounter with patients including review of chart and various tests results, discussions about plan of care and coordination of care plan   [name], MD 08/22/2021 12:31 PM  INTERVAL HISTORY: Please see below for problem oriented charting. she returns for surveillance follow-up for history of uterine cancer She has gained a lot of weight due to lots of  celebration at home Denies abdominal pain or changes in bowel habits although she tends to have chronic constipation No abdominal bloating or nausea  REVIEW OF SYSTEMS:   Constitutional: Denies fevers, chills or abnormal weight loss Eyes: Denies blurriness of vision Ears, nose, mouth, throat, and face: Denies mucositis or sore throat Respiratory: Denies cough, dyspnea or wheezes Cardiovascular: Denies palpitation, chest discomfort or lower extremity swelling Gastrointestinal:  Denies nausea, heartburn or change in bowel habits Skin: Denies abnormal skin rashes Lymphatics: Denies new lymphadenopathy or easy bruising Neurological:Denies numbness, tingling or new weaknesses Behavioral/Psych: Mood is stable, no new changes  All other systems were reviewed with the patient and are negative.  I have reviewed the past medical history, past surgical history, social history and family history with the patient and they are unchanged from previous note.  ALLERGIES:  has No Known Allergies.  MEDICATIONS:  Current Outpatient Medications  Medication Sig Dispense Refill   AMBULATORY NON FORMULARY MEDICATION Medication Name: CPAP with humidifier with 11 cm water pressure.  Dx OSA.  See attached Sleep Study.  Please fax to [place]. 1 Units 0   azelastine (ASTELIN) 0.1 % nasal spray Place into both nostrils as needed for rhinitis. Use in each nostril as directed     CALCIUM PO Take 1 tablet by mouth daily.     DULoxetine (CYMBALTA) 60 MG capsule TAKE 1 CAPSULE BY MOUTH DAILY 90 capsule 3   gabapentin (NEURONTIN) 300 MG capsule TAKE 2 CAPSULES BY MOUTH  DAILY 180 capsule 3   Krill Oil 500  MG CAPS Take 1 capsule by mouth daily.     lidocaine-prilocaine (EMLA) cream Apply 1 application topically as needed. 30 g 3   Multiple Vitamins-Minerals (CENTRUM SILVER 50+WOMEN) TABS Take 1 capsule by mouth daily.     Multiple Vitamins-Minerals (PRESERVISION AREDS) CAPS Take 1 capsule by mouth daily.      rOPINIRole (REQUIP) 0.5 MG tablet Take 1 tablet (0.5 mg total) by mouth at bedtime. 90 tablet 0   vitamin C (ASCORBIC ACID) 500 MG tablet Take 500 mg by mouth daily.     Vitamin D, Ergocalciferol, (DRISDOL) 1.25 MG (50000 UNIT) CAPS capsule Take 1 capsule (50,000 Units total) by mouth every 7 (seven) days. 4 capsule 0   No current facility-administered medications for this visit.    SUMMARY OF ONCOLOGIC HISTORY: Oncology History Overview Note  MSI Stable Negative genetics   Endometrial cancer ([place])  07/28/2019 Pathology Results   Endometrium, biopsy - ENDOMETRIOID CARCINOMA - SEE COMMENT Microscopic Comment Based on the biopsy, the carcinoma appears FIGO grade 1.   08/01/2019 Imaging   CT abdomen and pelvis No evidence of abdominal or pelvic metastatic disease.   Small uterine fibroid.   Colonic diverticulosis, without radiographic evidence of diverticulitis.   Aortic Atherosclerosis (ICD10-I70.0).   08/14/2019 Surgery   Surgeon: Donaciano Eva  Operation: Robotic-assisted laparoscopic total hysterectomy with bilateral salpingoophorectomy, SLN biopsy     Operative Findings:  : 8cm uterus which appeared grossly normal, normal appearing tubes and ovaries, no suspicious lymph nodes. Brisk oozing from skin incisions.    09/03/2019 Cancer Staging   Staging form: Corpus Uteri - Carcinoma and Carcinosarcoma, AJCC 8th Edition - Pathologic stage from 09/03/2019: FIGO Stage IIIA (pT3a, pN0, cM0) - Signed by [name], MD on 09/03/2019   09/04/2019 Pathology Results   FINAL MICROSCOPIC DIAGNOSIS:   A. LYMPH NODE, SENTINEL RIGHT OBTURATOR PROXIMAL, BIOPSY:  -  No carcinoma identified in one lymph node (0/1)  -  See comment   B. LYMPH NODE, SENTINEL RIGHT OBTURATOR DISTAL, BIOPSY:  -  No carcinoma identified in one lymph node (0/1)  -  See comment   C. LYMPH NODE, SENTINEL LEFT EXTERNAL ILIAC, BIOPSY:  -  No carcinoma identified in one lymph node (0/1)  -  See comment   D.  UTERUS AND BILATERAL ADNEXA, HYSTERECTOMY WITH SALPINGO-OOPHORECTOMY:   Uterus:  -  Endometrioid carcinoma, FIGO grade 1  -  Leiomyoma (1.8 cm; largest)  -  See oncology table and comment below   Cervix:  -  Cervix with squamous metaplasia  -  No carcinoma identified   Bilateral Ovaries:  -  Endometrioid carcinoma involving right ovary  -  No carcinoma involving left ovary  -  Endosalpingiosis   Bilateral Fallopian tubes:  -  No carcinoma identified   ONCOLOGY TABLE:   UTERUS, CARCINOMA OR CARCINOSARCOMA   Procedure: Total hysterectomy and bilateral salpingo-oophorectomy  Histologic type: Endometrioid carcinoma, NOS  Histologic Grade: FIGO grade 1  Myometrial invasion:       Depth of invasion: 3 mm       Myometrial thickness: 20 mm  Uterine Serosa Involvement: Not identified  Cervical stromal involvement: Not identified  Extent of involvement of other organs: Right ovary  Lymphovascular invasion: Not identified  Regional Lymph Nodes:       Examined:      3 Sentinel  0 non-sentinel                               3 total        Lymph nodes with metastasis: 0        Isolated tumor cells (<0.2 mm): 0        Micrometastasis:  (>0.2 mm and < 2.0 mm): 0        Macrometastasis: (>2.0 mm): 0        Extracapsular extension: N/A  Representative Tumor Block: D10  MMR / MSI testing: Pending  Pathologic Stage Classification (pTNM, AJCC 8th edition):  pT3a, pN0  Comments: Pancytokeratin performed on the lymph nodes is negative. The right ovary has a focus of carcinoma.     09/11/2019 Procedure   Placement of single lumen port a cath via right internal jugular vein. The catheter tip lies at the cavo-atrial junction. A power injectable port a cath was placed and is ready for immediate use.     09/12/2019 - 12/26/2019 Chemotherapy   The patient had carboplatin and taxol for chemotherapy treatment.     01/19/2020 Imaging   1. Interval total hysterectomy  with bilateral salpingo oophorectomy. No findings to suggest recurrent or metastatic disease. 2. Tiny hiatal hernia. 3. Left colonic diverticulosis without diverticulitis. 4. Aortic Atherosclerosis (ICD10-I70.0).   02/18/2021 Imaging   1. No evidence of metastatic disease in the chest, abdomen or pelvis. No evidence of a left chest wall mass to correlate with reported palpable abnormality. 2. Marked sigmoid diverticulosis. 3. Small hiatal hernia. 4. Aortic Atherosclerosis (ICD10-I70.0).       PHYSICAL EXAMINATION: ECOG PERFORMANCE STATUS: 0 - Asymptomatic  Vitals:   08/22/21 1058  BP: 118/60  Pulse: 63  Resp: 18  Temp: 97.8 F (36.6 C)  SpO2: 100%   Filed Weights   08/22/21 1058  Weight: 231 lb (104.8 kg)    GENERAL:alert, no distress and comfortable SKIN: skin color, texture, turgor are normal, no rashes or significant lesions EYES: normal, Conjunctiva are pink and non-injected, sclera clear OROPHARYNX:no exudate, no erythema and lips, buccal mucosa, and tongue normal  NECK: supple, thyroid normal size, non-tender, without nodularity LYMPH:  no palpable lymphadenopathy in the cervical, axillary or inguinal LUNGS: clear to auscultation and percussion with normal breathing effort HEART: regular rate & rhythm and no murmurs and no lower extremity edema ABDOMEN:abdomen soft, non-tender and normal bowel sounds Musculoskeletal:no cyanosis of digits and no clubbing  NEURO: alert & oriented x 3 with fluent speech, no focal motor/sensory deficits  LABORATORY DATA:  I have reviewed the data as listed    Component Value Date/Time   NA 140 07/13/2021 1021   NA 141 11/17/2020 1048   NA 141 01/20/2014 1604   K 4.2 07/13/2021 1021   K 4.2 01/20/2014 1604   CL 105 07/13/2021 1021   CO2 29 07/13/2021 1021   CO2 28 01/20/2014 1604   GLUCOSE 124 (H) 07/13/2021 1021   GLUCOSE 99 01/20/2014 1604   BUN 13 07/13/2021 1021   BUN 15 11/17/2020 1048   BUN 14.6 01/20/2014 1604    CREATININE 0.76 07/13/2021 1021   CREATININE 0.78 03/24/2019 1041   CREATININE 0.8 01/20/2014 1604   CALCIUM 10.0 07/13/2021 1021   CALCIUM 9.8 01/20/2014 1604   PROT 7.5 07/13/2021 1021   PROT 7.2 11/17/2020 1048   PROT 7.3 01/20/2014 1604   ALBUMIN 4.4 07/13/2021 1021   ALBUMIN 4.6 11/17/2020 1048  ALBUMIN 4.0 01/20/2014 1604   AST 19 07/13/2021 1021   AST 22 01/20/2014 1604   ALT 15 07/13/2021 1021   ALT 20 01/20/2014 1604   ALKPHOS 114 07/13/2021 1021   ALKPHOS 134 01/20/2014 1604   BILITOT 0.4 07/13/2021 1021   BILITOT <0.20 01/20/2014 1604   GFRNONAA >60 07/13/2021 1021   GFRNONAA 81 03/24/2019 1041   GFRAA >60 10/24/2019 0737   GFRAA 94 03/24/2019 1041    No results found for: "SPEP", "UPEP"  Lab Results  Component Value Date   WBC 6.5 07/13/2021   NEUTROABS 4.1 07/13/2021   HGB 13.5 07/13/2021   HCT 40.0 07/13/2021   MCV 97.1 07/13/2021   PLT 246 07/13/2021      Chemistry      Component Value Date/Time   NA 140 07/13/2021 1021   NA 141 11/17/2020 1048   NA 141 01/20/2014 1604   K 4.2 07/13/2021 1021   K 4.2 01/20/2014 1604   CL 105 07/13/2021 1021   CO2 29 07/13/2021 1021   CO2 28 01/20/2014 1604   BUN 13 07/13/2021 1021   BUN 15 11/17/2020 1048   BUN 14.6 01/20/2014 1604   CREATININE 0.76 07/13/2021 1021   CREATININE 0.78 03/24/2019 1041   CREATININE 0.8 01/20/2014 1604      Component Value Date/Time   CALCIUM 10.0 07/13/2021 1021   CALCIUM 9.8 01/20/2014 1604   ALKPHOS 114 07/13/2021 1021   ALKPHOS 134 01/20/2014 1604   AST 19 07/13/2021 1021   AST 22 01/20/2014 1604   ALT 15 07/13/2021 1021   ALT 20 01/20/2014 1604   BILITOT 0.4 07/13/2021 1021   BILITOT <0.20 01/20/2014 1604

## 2021-08-22 NOTE — Assessment & Plan Note (Signed)
The patient is completely asymptomatic Her last CT imaging showed no evidence of disease I plan to see her again around November for repeat imaging study If she have no signs of disease, we will get her port removed We will continue port maintain and flushes and blood work

## 2021-08-22 NOTE — Assessment & Plan Note (Signed)
We had extensive discussions in the past regarding the role of weight loss as a preventive strategy to reduce risk of breast cancer and endometrial cancer recurrence She appears motivated to work hard on dietary changes and exercise 

## 2021-09-02 ENCOUNTER — Other Ambulatory Visit: Payer: Self-pay | Admitting: Family Medicine

## 2021-09-02 DIAGNOSIS — Z0000 Encounter for general adult medical examination without abnormal findings: Secondary | ICD-10-CM

## 2021-09-07 ENCOUNTER — Other Ambulatory Visit: Payer: Self-pay

## 2021-09-07 ENCOUNTER — Inpatient Hospital Stay: Payer: Medicare Other | Attending: Hematology and Oncology

## 2021-09-07 ENCOUNTER — Encounter (INDEPENDENT_AMBULATORY_CARE_PROVIDER_SITE_OTHER): Payer: Self-pay

## 2021-09-07 DIAGNOSIS — C541 Malignant neoplasm of endometrium: Secondary | ICD-10-CM | POA: Insufficient documentation

## 2021-09-07 LAB — CMP (CANCER CENTER ONLY)
ALT: 13 U/L (ref 0–44)
AST: 17 U/L (ref 15–41)
Albumin: 4.2 g/dL (ref 3.5–5.0)
Alkaline Phosphatase: 110 U/L (ref 38–126)
Anion gap: 4 — ABNORMAL LOW (ref 5–15)
BUN: 16 mg/dL (ref 8–23)
CO2: 31 mmol/L (ref 22–32)
Calcium: 9.5 mg/dL (ref 8.9–10.3)
Chloride: 104 mmol/L (ref 98–111)
Creatinine: 0.7 mg/dL (ref 0.44–1.00)
GFR, Estimated: >60 mL/min (ref >60)
Glucose, Bld: 93 mg/dL (ref 70–99)
Potassium: 4.5 mmol/L (ref 3.5–5.1)
Sodium: 139 mmol/L (ref 135–145)
Total Bilirubin: 0.4 mg/dL (ref 0.3–1.2)
Total Protein: 7.1 g/dL (ref 6.5–8.1)

## 2021-09-07 LAB — CBC WITH DIFFERENTIAL (CANCER CENTER ONLY)
Abs Immature Granulocytes: 0.02 10*3/uL (ref 0.00–0.07)
Basophils Absolute: 0.1 10*3/uL (ref 0.0–0.1)
Basophils Relative: 1 %
Eosinophils Absolute: 0.1 10*3/uL (ref 0.0–0.5)
Eosinophils Relative: 1 %
HCT: 38 % (ref 36.0–46.0)
Hemoglobin: 13 g/dL (ref 12.0–15.0)
Immature Granulocytes: 0 %
Lymphocytes Relative: 25 %
Lymphs Abs: 1.9 10*3/uL (ref 0.7–4.0)
MCH: 33.1 pg (ref 26.0–34.0)
MCHC: 34.2 g/dL (ref 30.0–36.0)
MCV: 96.7 fL (ref 80.0–100.0)
Monocytes Absolute: 0.6 10*3/uL (ref 0.1–1.0)
Monocytes Relative: 7 %
Neutro Abs: 5 10*3/uL (ref 1.7–7.7)
Neutrophils Relative %: 66 %
Platelet Count: 224 10*3/uL (ref 150–400)
RBC: 3.93 MIL/uL (ref 3.87–5.11)
RDW: 11.9 % (ref 11.5–15.5)
WBC Count: 7.7 10*3/uL (ref 4.0–10.5)
nRBC: 0 % (ref 0.0–0.2)

## 2021-09-07 MED ORDER — SODIUM CHLORIDE 0.9% FLUSH
10.0000 mL | Freq: Once | INTRAVENOUS | Status: AC
  Administered 2021-09-07: 10 mL

## 2021-09-07 MED ORDER — HEPARIN SOD (PORK) LOCK FLUSH 100 UNIT/ML IV SOLN
500.0000 [IU] | Freq: Once | INTRAVENOUS | Status: AC
  Administered 2021-09-07: 500 [IU]

## 2021-09-12 ENCOUNTER — Ambulatory Visit (INDEPENDENT_AMBULATORY_CARE_PROVIDER_SITE_OTHER): Payer: Medicare Other | Admitting: Family Medicine

## 2021-09-19 ENCOUNTER — Ambulatory Visit (INDEPENDENT_AMBULATORY_CARE_PROVIDER_SITE_OTHER): Payer: Medicare Other | Admitting: Family Medicine

## 2021-09-19 ENCOUNTER — Encounter (INDEPENDENT_AMBULATORY_CARE_PROVIDER_SITE_OTHER): Payer: Self-pay | Admitting: Family Medicine

## 2021-09-19 VITALS — BP 128/84 | HR 64 | Temp 97.9°F | Ht 64.0 in | Wt 229.0 lb

## 2021-09-19 DIAGNOSIS — E669 Obesity, unspecified: Secondary | ICD-10-CM | POA: Diagnosis not present

## 2021-09-19 DIAGNOSIS — R7303 Prediabetes: Secondary | ICD-10-CM | POA: Insufficient documentation

## 2021-09-19 DIAGNOSIS — Z6839 Body mass index (BMI) 39.0-39.9, adult: Secondary | ICD-10-CM

## 2021-09-19 DIAGNOSIS — E559 Vitamin D deficiency, unspecified: Secondary | ICD-10-CM | POA: Diagnosis not present

## 2021-09-19 DIAGNOSIS — E66813 Obesity, class 3: Secondary | ICD-10-CM

## 2021-09-19 MED ORDER — VITAMIN D (ERGOCALCIFEROL) 1.25 MG (50000 UNIT) PO CAPS: 50000.0000 [IU] | ORAL_CAPSULE | ORAL | 0 refills | Status: DC

## 2021-09-26 NOTE — Progress Notes (Unsigned)
Chief Complaint:   OBESITY Patricia Waters is here to discuss her progress with her obesity treatment plan along with follow-up of her obesity related diagnoses. Patricia Waters is on following a lower carbohydrate, vegetable and lean protein rich diet plan and states she is following her eating plan approximately 40% of the time. Patricia Waters states she is doing 0 minutes 0 times per week.  Today's visit was #: 21 Starting weight: 258 lbs Starting date: 03/24/2020 Today's weight: 229 lbs Today's date: 09/19/2021 Total lbs lost to date: 29 Total lbs lost since last in-office visit: 1  Interim History: Patricia Waters did more eating out while traveling. She remained mindful and tried to decrease simple carbohydrates, and increase lean protein and vegetables.   Subjective:   1. Prediabetes Patricia Waters is working on decreasing simple carbohydrates in her diet. She has no signs of hypoglycemia. She is limited in exercise due to her pain. I discussed labs with the patient today.   2. Vitamin D deficiency Patricia Waters is on Vitamin D, and she denies nausea, vomiting, or muscle weakness.   Assessment/Plan:   1. Prediabetes Patricia Waters is to increase her activity after the next injection amd we will continue to follow.   2. Vitamin D deficiency Patricia Waters will continue prescription Vitamin D 50,000 IU once weekly, and we will refill for 1 month. We will recheck labs in 1 month.  - Vitamin D, Ergocalciferol, (DRISDOL) 1.25 MG (50000 UNIT) CAPS capsule; Take 1 capsule (50,000 Units total) by mouth every 7 (seven) days.  Dispense: 4 capsule; Refill: 0  3. Obesity, Current BMI 39.4 Patricia Waters is currently in the action stage of change. As such, her goal is to continue with weight loss efforts. She has agreed to the Category 3 Plan.   Behavioral modification strategies: no skipping meals.  Patricia Waters has agreed to follow-up with our clinic in 3 to 4 weeks. She was informed of the importance of frequent follow-up visits to maximize her  success with intensive lifestyle modifications for her multiple health conditions.   Objective:   Blood pressure 128/84, pulse 64, temperature 97.9 F (36.6 C), height '5\' 4"'$  (1.626 m), weight 229 lb (103.9 kg), SpO2 98 %. Body mass index is 39.31 kg/m.  General: Cooperative, alert, well developed, in no acute distress. HEENT: Conjunctivae and lids unremarkable. Cardiovascular: Regular rhythm.  Lungs: Normal work of breathing. Neurologic: No focal deficits.   Lab Results  Component Value Date   CREATININE 0.70 09/07/2021   BUN 16 09/07/2021   NA 139 09/07/2021   K 4.5 09/07/2021   CL 104 09/07/2021   CO2 31 09/07/2021   Lab Results  Component Value Date   ALT 13 09/07/2021   AST 17 09/07/2021   ALKPHOS 110 09/07/2021   BILITOT 0.4 09/07/2021   Lab Results  Component Value Date   HGBA1C 5.4 05/16/2021   HGBA1C 5.6 11/17/2020   HGBA1C 5.5 06/16/2020   HGBA1C 4.9 03/24/2020   HGBA1C 5.7 (A) 12/22/2019   Lab Results  Component Value Date   INSULIN 10.5 11/17/2020   INSULIN 8.1 06/16/2020   INSULIN 10.2 03/24/2020   Lab Results  Component Value Date   TSH 2.770 05/16/2021   Lab Results  Component Value Date   CHOL 208 (H) 11/17/2020   HDL 57 11/17/2020   LDLCALC 130 (H) 11/17/2020   TRIG 119 11/17/2020   CHOLHDL 3.3 03/24/2019   Lab Results  Component Value Date   VD25OH 54.5 05/16/2021   VD25OH 64.2 11/17/2020   VD25OH  68.6 06/16/2020   Lab Results  Component Value Date   WBC 7.7 09/07/2021   HGB 13.0 09/07/2021   HCT 38.0 09/07/2021   MCV 96.7 09/07/2021   PLT 224 09/07/2021   Lab Results  Component Value Date   IRON 85 11/25/2015   TIBC 345 11/25/2015   FERRITIN 89 11/25/2015   Attestation Statements:   Reviewed by clinician on day of visit: allergies, medications, problem list, medical history, surgical history, family history, social history, and previous encounter notes.   I, Trixie Dredge, am acting as transcriptionist for [name], MD.  I have reviewed the above documentation for accuracy and completeness, and I agree with the above. -  [name], MD

## 2021-09-27 ENCOUNTER — Ambulatory Visit (INDEPENDENT_AMBULATORY_CARE_PROVIDER_SITE_OTHER): Payer: Medicare Other

## 2021-09-27 ENCOUNTER — Ambulatory Visit (INDEPENDENT_AMBULATORY_CARE_PROVIDER_SITE_OTHER): Payer: Medicare Other | Admitting: Sports Medicine

## 2021-09-27 DIAGNOSIS — M1712 Unilateral primary osteoarthritis, left knee: Secondary | ICD-10-CM

## 2021-09-27 DIAGNOSIS — M1711 Unilateral primary osteoarthritis, right knee: Secondary | ICD-10-CM | POA: Diagnosis not present

## 2021-09-27 DIAGNOSIS — Z09 Encounter for follow-up examination after completed treatment for conditions other than malignant neoplasm: Secondary | ICD-10-CM

## 2021-09-27 MED ORDER — TRAMADOL HCL 50 MG PO TABS: 50.0000 mg | ORAL_TABLET | Freq: Three times a day (TID) | ORAL | 0 refills | Status: DC | PRN

## 2021-09-27 NOTE — Progress Notes (Signed)
    Procedures performed today:    Procedure: Real-time Ultrasound Guided aspiration/injection of left knee Device: Samsung [id number]  Verbal informed consent obtained.  Time-out conducted.  Noted no overlying erythema, induration, or other signs of local infection.  Skin prepped in a sterile fashion.  Local anesthesia: Topical Ethyl chloride.  With sterile technique and under real time ultrasound guidance: Noted effusion, aspirated 20 mils of clear, straw-colored fluid, syringe switched and 1 cc Kenalog 40, 2 cc lidocaine, 2 cc bupivacaine injected easily Completed without difficulty  Advised to call if fevers/chills, erythema, induration, drainage, or persistent bleeding.  Images permanently stored and available for review in PACS.  Impression: Technically successful ultrasound guided aspiration/injection.  Independent interpretation of notes and tests performed by another provider:   None.  Brief History, Exam, Impression, and Recommendations:    Primary localized osteoarthritis of left knee Increasing pain, medial joint line, occasional catching, effusion today. Aspiration, injection, x-rays, home conditioning, tramadol, return to see me in 6 weeks.    ____________________________________________ Gwen Her. Dianah Field, M.D., ABFM., CAQSM., AME. Primary Care and Sports Medicine [facility]  Adjunct Professor of [employer] [employer]  Risk manager

## 2021-09-27 NOTE — Assessment & Plan Note (Signed)
Increasing pain, medial joint line, occasional catching, effusion today. Aspiration, injection, x-rays, home conditioning, tramadol, return to see me in 6 weeks.

## 2021-09-28 ENCOUNTER — Encounter: Payer: Self-pay | Admitting: Sports Medicine

## 2021-10-20 ENCOUNTER — Encounter: Payer: Self-pay | Admitting: Family Medicine

## 2021-10-20 ENCOUNTER — Ambulatory Visit (INDEPENDENT_AMBULATORY_CARE_PROVIDER_SITE_OTHER): Payer: Medicare Other | Admitting: Family Medicine

## 2021-10-20 VITALS — BP 124/60 | HR 64 | Ht 64.0 in | Wt 230.0 lb

## 2021-10-20 DIAGNOSIS — Z0000 Encounter for general adult medical examination without abnormal findings: Secondary | ICD-10-CM | POA: Diagnosis not present

## 2021-10-20 DIAGNOSIS — Z23 Encounter for immunization: Secondary | ICD-10-CM

## 2021-10-20 NOTE — Patient Instructions (Signed)
  Patricia Waters , Thank you for taking time to come for your Medicare Wellness Visit. I appreciate your ongoing commitment to your health goals. Please review the following plan we discussed and let me know if I can assist you in the future.   These are the goals we discussed:  Goals      Exercise 3x per week (30 min per time)     Encourage routine exercise twice a week for 20 minutes with walking or biking, etc         This is a list of the screening recommended for you and due dates:  Health Maintenance  Topic Date Due   COVID-19 Vaccine (6 - Pfizer risk series) 12/08/2020   Pneumonia Vaccine (1 - PCV) Never done   DEXA scan (bone density measurement)  06/20/2021   Flu Shot  08/30/2021   Pap Smear  09/13/2021   HIV Screening  03/01/2028*   Colon Cancer Screening  10/24/2027   Tetanus Vaccine  03/23/2029   Hepatitis C Screening: USPSTF Recommendation to screen - Ages 18-79 yo.  Completed   Zoster (Shingles) Vaccine  Completed   HPV Vaccine  Aged Out  *Topic was postponed. The date shown is not the original due date.

## 2021-10-20 NOTE — Progress Notes (Addendum)
Subjective:    Patricia Waters is a 66 y.o. female who presents for a Welcome to Medicare exam.   Review of Systems Negative  Cardiac Risk Factors include: advanced age (>58mn, >>72women);obesity (BMI >30kg/m2)      Objective:    Today's Vitals   10/20/21 0935  BP: 124/60  Pulse: 64  SpO2: 95%  Weight: 230 lb (104.3 kg)  Height: '5\' 4"'$  (1.626 m)  Body mass index is 39.48 kg/m.  Medications Outpatient Encounter Medications as of 10/20/2021  Medication Sig   AMBULATORY NON FORMULARY MEDICATION Medication Name: CPAP with humidifier with 11 cm water pressure.  Dx OSA.  See attached Sleep Study.  [place] [facility]   azelastine (ASTELIN) 0.1 % nasal spray Place into both nostrils as needed for rhinitis. Use in each nostril as directed   CALCIUM PO Take 1 tablet by mouth daily.   DULoxetine (CYMBALTA) 60 MG capsule TAKE 1 CAPSULE BY MOUTH DAILY   gabapentin (NEURONTIN) 300 MG capsule TAKE 2 CAPSULES BY MOUTH  DAILY   Krill Oil 500 MG CAPS Take 1 capsule by mouth daily.   lidocaine-prilocaine (EMLA) cream Apply 1 application topically as needed.   Multiple Vitamins-Minerals (CENTRUM SILVER 50+WOMEN) TABS Take 1 capsule by mouth daily.   Multiple Vitamins-Minerals (PRESERVISION AREDS) CAPS Take 1 capsule by mouth daily.   vitamin C (ASCORBIC ACID) 500 MG tablet Take 500 mg by mouth daily.   [DISCONTINUED] rOPINIRole (REQUIP) 0.5 MG tablet TAKE 1 TABLET BY MOUTH AT  BEDTIME   [DISCONTINUED] traMADol (ULTRAM) 50 MG tablet Take 1 tablet (50 mg total) by mouth every 8 (eight) hours as needed for moderate pain.   [DISCONTINUED] Vitamin D, Ergocalciferol, (DRISDOL) 1.25 MG (50000 UNIT) CAPS capsule Take 1 capsule (50,000 Units total) by mouth every 7 (seven) days.   No facility-administered encounter medications on file as of 10/20/2021.     History: Past Medical History:  Diagnosis Date   Anemia    Arthritis    Back pain    Bilateral swelling of feet    Breast cancer  (HCC)    at age 66  Constipation    Family history of breast cancer    Family history of colon cancer    Family history of prostate cancer in father    Family history of uterine cancer    History of radiation therapy    Joint pain    Lymph edema    right arm    Musculoskeletal pain    Other fatigue    PONV (postoperative nausea and vomiting)    Pre-diabetes    Shortness of breath on exertion    Sleep apnea    pt has nose pillows for cpap per pt - will bring DOS    Vertigo    Past Surgical History:  Procedure Laterality Date   BACK SURGERY     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     BILATERAL TOTAL MASTECTOMY WITH AXILLARY LYMPH NODE DISSECTION Bilateral 2005   BREAST LUMPECTOMY  1994   right   CERVICAL FUSION  1995   C5-C7   IR IMAGING GUIDED PORT INSERTION  09/11/2019   MASTECTOMY  2005   Bilat    ROBOTIC ASSISTED TOTAL HYSTERECTOMY WITH BILATERAL SALPINGO OOPHERECTOMY N/A 08/14/2019   Procedure: XI ROBOTIC ASSISTED TOTAL HYSTERECTOMY WITH BILATERAL SALPINGO OOPHORECTOMY;  Surgeon: REveritt Amber MD;  Location: [facility];  Service: Gynecology;  Laterality: N/A;  SENTINEL NODE BIOPSY N/A 08/14/2019   Procedure: SENTINEL NODE BIOPSY;  Surgeon: [name], MD;  Location: [facility];  Service: Gynecology;  Laterality: N/A;   WISDOM TOOTH EXTRACTION Bilateral 2004    Family History  Problem Relation Age of Onset   Stroke Mother    Hyperlipidemia Mother    Skin cancer Mother    Breast cancer Mother 70       ? recurrence at 61   Endometrial cancer Mother 14   Hypertension Mother    Cancer Mother    Obesity Mother    Diabetes Father    Hyperlipidemia Father    Heart attack Father 10   Colon cancer Father 39   Prostate cancer Father 28       Gleason 9   Hypertension Father    Heart disease Father    Cancer Father    Sleep apnea Father    Alcoholism Father    Obesity Father    Diabetes Paternal Grandmother    Heart attack Paternal Grandmother     Rheum arthritis Paternal Grandmother    Hyperlipidemia Sister    Heart attack Paternal Grandfather    Heart attack Maternal Grandfather    Heart attack Maternal Grandmother    Skin cancer Maternal Grandmother    Rheum arthritis Maternal Grandmother    Breast cancer Sister 77   Lymphoma Maternal Aunt 65   Kidney cancer Maternal Aunt 60   Cervical cancer Cousin 51   Social History   Occupational History   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[place]: [employer]  Tobacco Use   Smoking status: Former    Types: Cigarettes    Quit date: 10/31/2006    Years since quitting: 15.0   Smokeless tobacco: Never  Vaping Use   Vaping Use: Never used  Substance and Sexual Activity   Alcohol use: Not Currently   Drug use: No   Sexual activity: Not Currently    Partners: Female    Birth control/protection: None    Tobacco Counseling Counseling given: Not Answered   Immunizations and Health Maintenance Immunization History  Administered Date(s) Administered   Fluad Quad(high Dose 65+) 10/20/2021   Influenza Inj Mdck Quad Pf 10/13/2020   Influenza Split 10/12/2011   Influenza,inj,Quad PF,6+ Mos 11/08/2012, 09/25/2013, 10/19/2014, 10/16/2016, 10/24/2017, 09/17/2018, 10/24/2019   Influenza-Unspecified 11/01/2015   Moderna SARS-COV2 Booster Vaccination 11/01/2021   PFIZER Comirnaty(Gray Top)Covid-19 Tri-Sucrose Vaccine 07/14/2020   PFIZER(Purple Top)SARS-COV-2 Vaccination 04/28/2019, 05/21/2019, 10/03/2019   PNEUMOCOCCAL CONJUGATE-20 10/20/2021   Pfizer Covid-19 Vaccine Bivalent Booster 88yr & up 10/13/2020   Td 01/31/1999, 10/16/2008   Tdap 03/24/2019   Zoster Recombinat (Shingrix) 10/16/2016, 12/15/2016   Health Maintenance Due  Topic Date Due   DEXA SCAN  06/20/2021   PAP SMEAR-Modifier  09/13/2021    Activities of Daily Living    10/20/2021    9:35 AM  In your present state of health, do you have any difficulty performing the  following activities:  Hearing? 1  Vision? 0  Difficulty concentrating or making decisions? 0  Walking or climbing stairs? 0  Dressing or bathing? 0  Doing errands, shopping? 0  Preparing Food and eating ? N  Using the Toilet? N  In the past six months, have you accidently leaked urine? Y  Comment wears a pad daily  Do you have problems with loss of bowel control? N  Managing your Medications? N  Managing your Finances? N  Housekeeping or  managing your Housekeeping? N     Physical Exam Vitals and nursing note reviewed.  Constitutional:      Appearance: She is well-developed.  HENT:     Head: Normocephalic and atraumatic.  Cardiovascular:     Rate and Rhythm: Normal rate and regular rhythm.     Heart sounds: Normal heart sounds.  Pulmonary:     Effort: Pulmonary effort is normal.     Breath sounds: Normal breath sounds.  Skin:    General: Skin is warm and dry.  Neurological:     Mental Status: She is alert and oriented to person, place, and time.  Psychiatric:        Behavior: Behavior normal.    (optional), or other factors deemed appropriate based on the beneficiary's medical and social history and current clinical standards.  Advanced Directives:      Assessment:    This is a routine wellness examination for this patient .   Vision/Hearing screen No results found.  Dietary issues and exercise activities discussed:  Current Exercise Habits: The patient does not participate in regular exercise at present, Exercise limited by: orthopedic condition(s)   Goals      Exercise 3x per week (30 min per time)     Encourage routine exercise twice a week for 20 minutes with walking or biking, etc        Depression Screen    06/06/2021   10:44 AM 03/24/2020    9:41 AM 01/20/2019    9:59 AM 01/20/2019    9:58 AM  PHQ 2/9 Scores  PHQ - 2 Score 0 4 0 0  PHQ- 9 Score '1 12 2      '$ Fall Risk    10/20/2021   12:11 PM  Fall Risk   Falls in the past year? 0  Number  falls in past yr: 0  Injury with Fall? 0  Risk for fall due to : No Fall Risks  Follow up Falls evaluation completed    Cognitive Function:        Patient Care Team: Hali Marry, MD as PCP - General (Family Medicine) Druscilla Brownie, MD (Dermatology) Kristeen Miss, MD (Neurosurgery) Guss Bunde, MD (Obstetrics and Gynecology)     Plan:   [facility] Wellness   I have personally reviewed and noted the following in the patient's chart:   Medical and social history Use of alcohol, tobacco or illicit drugs  Current medications and supplements Functional ability and status Nutritional status Physical activity Advanced directives List of other physicians Hospitalizations, surgeries, and ER visits in previous 12 months Vitals Screenings to include cognitive, depression, and falls Referrals and appointments  EKG today shows rate of 53 bpm, normal sinus rhythm with an RSR prime pattern suggesting possible right ventricular conduction delay.  We will compare to old EKG which showed some right bundle blanch block in February 2002.  In addition, I have reviewed and discussed with patient certain preventive protocols, quality metrics, and best practice recommendations. A written personalized care plan for preventive services as well as general preventive health recommendations were provided to patient.     Beatrice Lecher, MD 11/09/2021

## 2021-10-26 ENCOUNTER — Telehealth: Payer: Self-pay

## 2021-10-26 NOTE — Telephone Encounter (Signed)
Pt called today to schedule her 6 month follow up with Dr. Berline Lopes. She states she is having pus like nodules/pimples around her anus and vulva. They have been there a few weeks, not painful, no oozing just uncomfortable.no fever/chills. She states she has tried black salve and antibiotic soaps. She was just wanting Dr.Tucker to know so she can be checked when she comes in.   Appointment scheduled for Friday 10/6 @ 3:45. Pt couldn't come in sooner due to being at the beach

## 2021-11-02 ENCOUNTER — Inpatient Hospital Stay: Payer: Medicare Other | Attending: Hematology and Oncology

## 2021-11-02 ENCOUNTER — Other Ambulatory Visit: Payer: Self-pay

## 2021-11-02 DIAGNOSIS — N9089 Other specified noninflammatory disorders of vulva and perineum: Secondary | ICD-10-CM | POA: Diagnosis not present

## 2021-11-02 DIAGNOSIS — Z90710 Acquired absence of both cervix and uterus: Secondary | ICD-10-CM | POA: Insufficient documentation

## 2021-11-02 DIAGNOSIS — Z9221 Personal history of antineoplastic chemotherapy: Secondary | ICD-10-CM | POA: Diagnosis not present

## 2021-11-02 DIAGNOSIS — Z90722 Acquired absence of ovaries, bilateral: Secondary | ICD-10-CM | POA: Diagnosis not present

## 2021-11-02 DIAGNOSIS — C541 Malignant neoplasm of endometrium: Secondary | ICD-10-CM

## 2021-11-02 DIAGNOSIS — R32 Unspecified urinary incontinence: Secondary | ICD-10-CM | POA: Diagnosis not present

## 2021-11-02 DIAGNOSIS — Z8542 Personal history of malignant neoplasm of other parts of uterus: Secondary | ICD-10-CM | POA: Insufficient documentation

## 2021-11-02 LAB — CBC WITH DIFFERENTIAL (CANCER CENTER ONLY)
Abs Immature Granulocytes: 0.04 10*3/uL (ref 0.00–0.07)
Basophils Absolute: 0 10*3/uL (ref 0.0–0.1)
Basophils Relative: 1 %
Eosinophils Absolute: 0 10*3/uL (ref 0.0–0.5)
Eosinophils Relative: 0 %
HCT: 38.6 % (ref 36.0–46.0)
Hemoglobin: 13 g/dL (ref 12.0–15.0)
Immature Granulocytes: 1 %
Lymphocytes Relative: 9 %
Lymphs Abs: 0.7 10*3/uL (ref 0.7–4.0)
MCH: 32.8 pg (ref 26.0–34.0)
MCHC: 33.7 g/dL (ref 30.0–36.0)
MCV: 97.5 fL (ref 80.0–100.0)
Monocytes Absolute: 0.4 10*3/uL (ref 0.1–1.0)
Monocytes Relative: 5 %
Neutro Abs: 7.3 10*3/uL (ref 1.7–7.7)
Neutrophils Relative %: 84 %
Platelet Count: 199 10*3/uL (ref 150–400)
RBC: 3.96 MIL/uL (ref 3.87–5.11)
RDW: 12.3 % (ref 11.5–15.5)
WBC Count: 8.6 10*3/uL (ref 4.0–10.5)
nRBC: 0 % (ref 0.0–0.2)

## 2021-11-02 LAB — CMP (CANCER CENTER ONLY)
ALT: 16 U/L (ref 0–44)
AST: 20 U/L (ref 15–41)
Albumin: 4.2 g/dL (ref 3.5–5.0)
Alkaline Phosphatase: 106 U/L (ref 38–126)
Anion gap: 5 (ref 5–15)
BUN: 9 mg/dL (ref 8–23)
CO2: 29 mmol/L (ref 22–32)
Calcium: 9.4 mg/dL (ref 8.9–10.3)
Chloride: 103 mmol/L (ref 98–111)
Creatinine: 0.7 mg/dL (ref 0.44–1.00)
GFR, Estimated: >60 mL/min (ref >60)
Glucose, Bld: 103 mg/dL — ABNORMAL HIGH (ref 70–99)
Potassium: 4.2 mmol/L (ref 3.5–5.1)
Sodium: 137 mmol/L (ref 135–145)
Total Bilirubin: 0.5 mg/dL (ref 0.3–1.2)
Total Protein: 6.7 g/dL (ref 6.5–8.1)

## 2021-11-02 MED ORDER — HEPARIN SOD (PORK) LOCK FLUSH 100 UNIT/ML IV SOLN
500.0000 [IU] | Freq: Once | INTRAVENOUS | Status: AC
  Administered 2021-11-02: 500 [IU]

## 2021-11-02 MED ORDER — SODIUM CHLORIDE 0.9% FLUSH
10.0000 mL | Freq: Once | INTRAVENOUS | Status: AC
  Administered 2021-11-02: 10 mL

## 2021-11-03 ENCOUNTER — Encounter: Payer: Self-pay | Admitting: Gynecologic Oncology

## 2021-11-03 ENCOUNTER — Other Ambulatory Visit: Payer: Self-pay | Admitting: Family Medicine

## 2021-11-03 DIAGNOSIS — Z0000 Encounter for general adult medical examination without abnormal findings: Secondary | ICD-10-CM

## 2021-11-04 ENCOUNTER — Other Ambulatory Visit: Payer: Self-pay

## 2021-11-04 ENCOUNTER — Encounter: Payer: Self-pay | Admitting: Gynecologic Oncology

## 2021-11-04 ENCOUNTER — Encounter: Payer: Self-pay | Admitting: Family Medicine

## 2021-11-04 ENCOUNTER — Ambulatory Visit (HOSPITAL_COMMUNITY)
Admission: RE | Admit: 2021-11-04 | Discharge: 2021-11-04 | Disposition: A | Discharge status: Home / Self Care | Payer: Medicare Other | Source: Ambulatory Visit | Attending: Hematology and Oncology | Admitting: Hematology and Oncology

## 2021-11-04 ENCOUNTER — Inpatient Hospital Stay (HOSPITAL_BASED_OUTPATIENT_CLINIC_OR_DEPARTMENT_OTHER): Payer: Medicare Other | Admitting: Gynecologic Oncology

## 2021-11-04 VITALS — BP 139/76 | HR 72 | Temp 98.8°F | Resp 18 | Ht 63.39 in | Wt 237.6 lb

## 2021-11-04 DIAGNOSIS — N9089 Other specified noninflammatory disorders of vulva and perineum: Secondary | ICD-10-CM

## 2021-11-04 DIAGNOSIS — C541 Malignant neoplasm of endometrium: Secondary | ICD-10-CM | POA: Diagnosis present

## 2021-11-04 DIAGNOSIS — K5730 Diverticulosis of large intestine without perforation or abscess without bleeding: Secondary | ICD-10-CM | POA: Diagnosis not present

## 2021-11-04 DIAGNOSIS — Z8542 Personal history of malignant neoplasm of other parts of uterus: Secondary | ICD-10-CM | POA: Diagnosis not present

## 2021-11-04 DIAGNOSIS — L700 Acne vulgaris: Secondary | ICD-10-CM

## 2021-11-04 MED ORDER — IOHEXOL 300 MG/ML  SOLN
100.0000 mL | Freq: Once | INTRAMUSCULAR | Status: AC | PRN
  Administered 2021-11-04: 100 mL via INTRAVENOUS

## 2021-11-04 MED ORDER — SODIUM CHLORIDE (PF) 0.9 % IJ SOLN
INTRAMUSCULAR | Status: AC
  Filled 2021-11-04: qty 50

## 2021-11-04 NOTE — Patient Instructions (Signed)
It was good to see you today.  I do not see or feel any evidence of cancer recurrence.  Since we are a little bit off cycle with your visits, lets plan for a visit in early March, in approximately 5 months.  Please call sometime in January to get this scheduled.  As always, please call if you develop any new and concerning symptoms before your next visit.

## 2021-11-04 NOTE — Progress Notes (Signed)
Gynecologic Oncology Return Clinic Visit  11/04/2021  Reason for Visit: Surveillance visit in the setting of uterine cancer  Treatment History: Oncology History Overview Note  MSI Stable Negative genetics   Endometrial cancer ([place])  07/28/2019 Pathology Results   Endometrium, biopsy - ENDOMETRIOID CARCINOMA - SEE COMMENT Microscopic Comment Based on the biopsy, the carcinoma appears FIGO grade 1.   08/01/2019 Imaging   CT abdomen and pelvis No evidence of abdominal or pelvic metastatic disease.   Small uterine fibroid.   Colonic diverticulosis, without radiographic evidence of diverticulitis.   Aortic Atherosclerosis (ICD10-I70.0).   08/14/2019 Surgery   Surgeon: Donaciano Eva  Operation: Robotic-assisted laparoscopic total hysterectomy with bilateral salpingoophorectomy, SLN biopsy     Operative Findings:  : 8cm uterus which appeared grossly normal, normal appearing tubes and ovaries, no suspicious lymph nodes. Brisk oozing from skin incisions.    09/03/2019 Cancer Staging   Staging form: Corpus Uteri - Carcinoma and Carcinosarcoma, AJCC 8th Edition - Pathologic stage from 09/03/2019: FIGO Stage IIIA (pT3a, pN0, cM0) - Signed by [name], MD on 09/03/2019   09/04/2019 Pathology Results   FINAL MICROSCOPIC DIAGNOSIS:   A. LYMPH NODE, SENTINEL RIGHT OBTURATOR PROXIMAL, BIOPSY:  -  No carcinoma identified in one lymph node (0/1)  -  See comment   B. LYMPH NODE, SENTINEL RIGHT OBTURATOR DISTAL, BIOPSY:  -  No carcinoma identified in one lymph node (0/1)  -  See comment   C. LYMPH NODE, SENTINEL LEFT EXTERNAL ILIAC, BIOPSY:  -  No carcinoma identified in one lymph node (0/1)  -  See comment   D. UTERUS AND BILATERAL ADNEXA, HYSTERECTOMY WITH SALPINGO-OOPHORECTOMY:   Uterus:  -  Endometrioid carcinoma, FIGO grade 1  -  Leiomyoma (1.8 cm; largest)  -  See oncology table and comment below   Cervix:  -  Cervix with squamous metaplasia  -  No carcinoma identified    Bilateral Ovaries:  -  Endometrioid carcinoma involving right ovary  -  No carcinoma involving left ovary  -  Endosalpingiosis   Bilateral Fallopian tubes:  -  No carcinoma identified   ONCOLOGY TABLE:   UTERUS, CARCINOMA OR CARCINOSARCOMA   Procedure: Total hysterectomy and bilateral salpingo-oophorectomy  Histologic type: Endometrioid carcinoma, NOS  Histologic Grade: FIGO grade 1  Myometrial invasion:       Depth of invasion: 3 mm       Myometrial thickness: 20 mm  Uterine Serosa Involvement: Not identified  Cervical stromal involvement: Not identified  Extent of involvement of other organs: Right ovary  Lymphovascular invasion: Not identified  Regional Lymph Nodes:       Examined:      3 Sentinel                               0 non-sentinel                               3 total        Lymph nodes with metastasis: 0        Isolated tumor cells (<0.2 mm): 0        Micrometastasis:  (>0.2 mm and < 2.0 mm): 0        Macrometastasis: (>2.0 mm): 0        Extracapsular extension: N/A  Representative Tumor Block: D10  MMR / MSI testing: Pending  Pathologic  Stage Classification (pTNM, AJCC 8th edition):  pT3a, pN0  Comments: Pancytokeratin performed on the lymph nodes is negative. The right ovary has a focus of carcinoma.     09/11/2019 Procedure   Placement of single lumen port a cath via right internal jugular vein. The catheter tip lies at the cavo-atrial junction. A power injectable port a cath was placed and is ready for immediate use.     09/12/2019 - 12/26/2019 Chemotherapy   The patient had carboplatin and taxol for chemotherapy treatment.     01/19/2020 Imaging   1. Interval total hysterectomy with bilateral salpingo oophorectomy. No findings to suggest recurrent or metastatic disease. 2. Tiny hiatal hernia. 3. Left colonic diverticulosis without diverticulitis. 4. Aortic Atherosclerosis (ICD10-I70.0).   02/18/2021 Imaging   1. No evidence of metastatic  disease in the chest, abdomen or pelvis. No evidence of a left chest wall mass to correlate with reported palpable abnormality. 2. Marked sigmoid diverticulosis. 3. Small hiatal hernia. 4. Aortic Atherosclerosis (ICD10-I70.0).       Interval History: Doing well.  Has been traveling to the beach multiple times this summer.  Denies any vaginal bleeding or discharge.  Reports intermittent constipation in the setting of travel and dietary changes.  Has not gotten back on her MiraLAX routine.  Denies any urinary symptoms.  Denies any abdominal or pelvic pain.  4 weeks ago, developed a painful little white bump on her posterior vulva.  She has been using antibacterial soap.  She describes this lesion is bothersome but not painful.  She denies any associated pruritus.  She wears a pad all the time given some urinary incontinence.  She now has noticed some smaller white bumps around her anus.  Past Medical/Surgical History: Past Medical History:  Diagnosis Date   Anemia    Arthritis    Back pain    Bilateral swelling of feet    Breast cancer ([place])    at age 77   Constipation    Family history of breast cancer    Family history of colon cancer    Family history of prostate cancer in father    Family history of uterine cancer    History of radiation therapy    Joint pain    Lymph edema    right arm    Musculoskeletal pain    Other fatigue    PONV (postoperative nausea and vomiting)    Pre-diabetes    Shortness of breath on exertion    Sleep apnea    pt has nose pillows for cpap per pt - will bring DOS    Vertigo     Past Surgical History:  Procedure Laterality Date   BACK SURGERY     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     BILATERAL TOTAL MASTECTOMY WITH AXILLARY LYMPH NODE DISSECTION Bilateral 2005   BREAST LUMPECTOMY  1994   right   CERVICAL FUSION  1995   C5-C7   IR IMAGING GUIDED PORT INSERTION  09/11/2019   MASTECTOMY  2005   Bilat    ROBOTIC  ASSISTED TOTAL HYSTERECTOMY WITH BILATERAL SALPINGO OOPHERECTOMY N/A 08/14/2019   Procedure: XI ROBOTIC ASSISTED TOTAL HYSTERECTOMY WITH BILATERAL SALPINGO OOPHORECTOMY;  Surgeon: [name], MD;  Location: [facility];  Service: Gynecology;  Laterality: N/A;   SENTINEL NODE BIOPSY N/A 08/14/2019   Procedure: SENTINEL NODE BIOPSY;  Surgeon: [name], MD;  Location: [facility];  Service: Gynecology;  Laterality: N/A;   WISDOM TOOTH EXTRACTION Bilateral  2004    Family History  Problem Relation Age of Onset   Stroke Mother    Hyperlipidemia Mother    Skin cancer Mother    Breast cancer Mother 3       ? recurrence at 69   Endometrial cancer Mother 45   Hypertension Mother    Cancer Mother    Obesity Mother    Diabetes Father    Hyperlipidemia Father    Heart attack Father 48   Colon cancer Father 36   Prostate cancer Father 5       Gleason 9   Hypertension Father    Heart disease Father    Cancer Father    Sleep apnea Father    Alcoholism Father    Obesity Father    Diabetes Paternal Grandmother    Heart attack Paternal Grandmother    Rheum arthritis Paternal Grandmother    Hyperlipidemia Sister    Heart attack Paternal Grandfather    Heart attack Maternal Grandfather    Heart attack Maternal Grandmother    Skin cancer Maternal Grandmother    Rheum arthritis Maternal Grandmother    Breast cancer Sister 47   Lymphoma Maternal Aunt 65   Kidney cancer Maternal Aunt 60   Cervical cancer Cousin 51    Social History   Socioeconomic History   Marital status: Married    Spouse name: Chief Financial Officer   Number of children: 0   Years of education: Not on file   Highest education level: Not on file  Occupational History   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 Former Therapist, nutritional.    Social Determinants of Health   Financial Resource Strain: Not on file  Food Insecurity: Not on file  Transportation Needs: Not on file  Physical Activity: Not on file  Stress: Not on file  Social Connections: Not on file    Current Medications:  Current Outpatient Medications:    AMBULATORY NON FORMULARY MEDICATION, Medication Name: CPAP with humidifier with 11 cm water pressure.  Dx OSA.  See attached Sleep Study.  Please fax to [facility]., Disp: 1 Units, Rfl: 0   azelastine (ASTELIN) 0.1 % nasal spray, Place into both nostrils as needed for rhinitis. Use in each nostril as directed, Disp: , Rfl:    CALCIUM PO, Take 1 tablet by mouth daily., Disp: , Rfl:    DULoxetine (CYMBALTA) 60 MG capsule, TAKE 1 CAPSULE BY MOUTH DAILY, Disp: 90 capsule, Rfl: 3   gabapentin (NEURONTIN) 300 MG capsule, TAKE 2 CAPSULES BY MOUTH  DAILY, Disp: 180 capsule, Rfl: 3   Krill Oil 500 MG CAPS, Take 1 capsule by mouth daily., Disp: , Rfl:    lidocaine-prilocaine (EMLA) cream, Apply 1 application topically as needed., Disp: 30 g, Rfl: 3   Multiple Vitamins-Minerals (CENTRUM SILVER 50+WOMEN) TABS, Take 1 capsule by mouth daily., Disp: , Rfl:    Multiple Vitamins-Minerals (  PRESERVISION AREDS) CAPS, Take 1 capsule by mouth daily., Disp: , Rfl:    vitamin C (ASCORBIC ACID) 500 MG tablet, Take 500 mg by mouth daily., Disp: , Rfl:    rOPINIRole (REQUIP) 0.5 MG tablet, TAKE 1 TABLET BY MOUTH AT  BEDTIME, Disp: 100 tablet, Rfl: 3  Review of Systems: + constipation Denies appetite changes, fevers, chills, fatigue, unexplained weight changes. Denies hearing loss, neck lumps or masses, mouth sores, ringing in ears or  voice changes. Denies cough or wheezing.  Denies shortness of breath. Denies chest pain or palpitations. Denies leg swelling. Denies abdominal distention, pain, blood in stools, diarrhea, nausea, vomiting, or early satiety. Denies pain with intercourse, dysuria, frequency, hematuria or incontinence. Denies hot flashes, pelvic pain, vaginal bleeding or vaginal discharge.   Denies joint pain, back pain or muscle pain/cramps. Denies itching, rash, or wounds. Denies dizziness, headaches, numbness or seizures. Denies swollen lymph nodes or glands, denies easy bruising or bleeding. Denies anxiety, depression, confusion, or decreased concentration.  Physical Exam: BP 139/76 (BP Location: Left Arm, Patient Position: Sitting)   Pulse 72   Temp 98.8 F (37.1 C) (Oral)   Resp 18   Ht 5' 3.39" (1.61 m)   Wt 237 lb 9.6 oz (107.8 kg)   SpO2 100%   BMI 41.58 kg/m  General: Alert, oriented, no acute distress. HEENT: Normocephalic, atraumatic, sclera anicteric. Chest: Clear to auscultation bilaterally.  No wheezes or rhonchi. Cardiovascular: Regular rate and rhythm, no murmurs. Abdomen: Obese, soft, nontender.  Normoactive bowel sounds.  No masses or hepatosplenomegaly appreciated.  Well-healed incisions. Extremities: Grossly normal range of motion.  Warm, well perfused.  No edema bilaterally. Skin: No rashes or lesions noted. Lymphatics: No cervical, supraclavicular, or inguinal adenopathy. GU: Normal appearing external genitalia without erythema, excoriation.  Small, approximately 1 cm slightly raised and white lesion on the posterior left vulva.  I was able to expel some thick sebaceous contents.  Speculum exam reveals cuff intact, mild atrophy.  Bimanual exam reveals no nodularity or masses.  Rectovaginal exam confirms findings.  Laboratory & Radiologic Studies: None new  Assessment & Plan: Patricia Waters is a 66 y.o. 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   The patient is overall doing quite well and is NED on exam today.  We will continue to alternate visits between Dr. [name] and myself.  She has a CT scan scheduled for later this afternoon.  She sees Dr. [name] in November.  I will plan to see her back in March.  Discussed lesion on her vulva is likely a sebaceous cyst versus comedone.  Treated today.   We discussed that in the setting of her cancer, no routine surveillance imaging is recommended.  We will get imaging if she develops symptoms.  Additionally, given her history, no routine Pap tests are indicated.   26 minutes of total time was spent for this patient encounter, including preparation, face-to-face counseling with the patient and coordination of care, and documentation of the encounter.  Jeral Pinch, MD  Division of Gynecologic Oncology  Department of Obstetrics and Gynecology  [facility]

## 2021-11-08 ENCOUNTER — Ambulatory Visit (INDEPENDENT_AMBULATORY_CARE_PROVIDER_SITE_OTHER): Payer: Medicare Other | Admitting: Sports Medicine

## 2021-11-08 ENCOUNTER — Ambulatory Visit: Payer: Medicare Other | Admitting: Family Medicine

## 2021-11-08 ENCOUNTER — Ambulatory Visit (INDEPENDENT_AMBULATORY_CARE_PROVIDER_SITE_OTHER): Payer: Medicare Other

## 2021-11-08 DIAGNOSIS — M19072 Primary osteoarthritis, left ankle and foot: Secondary | ICD-10-CM

## 2021-11-08 DIAGNOSIS — M1712 Unilateral primary osteoarthritis, left knee: Secondary | ICD-10-CM | POA: Diagnosis not present

## 2021-11-08 NOTE — Progress Notes (Signed)
    Procedures performed today:    Procedure: Real-time Ultrasound Guided injection of the left ankle Device: Samsung HS60  Verbal informed consent obtained.  Time-out conducted.  Noted no overlying erythema, induration, or other signs of local infection.  Skin prepped in a sterile fashion.  Local anesthesia: Topical Ethyl chloride.  With sterile technique and under real time ultrasound guidance: Noted arthritic joint, 1 cc lidocaine, 1 cc kenalog 40, 1 cc bupivacaine injected easily. Completed without difficulty  Advised to call if fevers/chills, erythema, induration, drainage, or persistent bleeding.  Images permanently stored and available for review in PACS.  Impression: Technically successful ultrasound guided injection.  Independent interpretation of notes and tests performed by another provider:   None.  Brief History, Exam, Impression, and Recommendations:    Primary osteoarthritis of left ankle This is a very pleasant 66 year old female, known primary osteoarthritis of the ankle noted on x-rays, moderate gelling, pain at sinus tarsi, after failure of multiple nonoperative and noninvasive modalities we injected her ankle in May of this year. Now having recurrence of pain, repeat injection today. Return as needed.  Primary localized osteoarthritis of left knee Doing much better after steroid injection at the last visit but still having some discomfort, she will look into viscosupplementation and let me know if she would like [name] to work on approval.    ____________________________________________ Gwen Her. Dianah Field, M.D., ABFM., CAQSM., AME. Primary Care and Sports Medicine [facility]  Adjunct Professor of [place] [employer] [facility] [employer]  Risk manager

## 2021-11-08 NOTE — Assessment & Plan Note (Signed)
This is a very pleasant 66 year old female, known primary osteoarthritis of the ankle noted on x-rays, moderate gelling, pain at sinus tarsi, after failure of multiple nonoperative and noninvasive modalities we injected her ankle in May of this year. Now having recurrence of pain, repeat injection today. Return as needed.

## 2021-11-08 NOTE — Assessment & Plan Note (Signed)
Doing much better after steroid injection at the last visit but still having some discomfort, she will look into viscosupplementation and let me know if she would like [name] to work on approval.

## 2021-11-09 ENCOUNTER — Telehealth: Payer: Self-pay | Admitting: Family Medicine

## 2021-11-09 DIAGNOSIS — R9431 Abnormal electrocardiogram [ECG] [EKG]: Secondary | ICD-10-CM

## 2021-11-09 NOTE — Telephone Encounter (Signed)
Hi Patricia Waters, I did want to let you know that I compared to her EKG during her Medicare wellness to an old 1 back in February 2022.  There is some slight change between the 2.  So would like to have a cardiologist evaluate you just to make sure were not missing anything.  I do not think it is urgent since you are not having any symptoms but since there has been a change I think it would be prudent for [name] to just investigate further.  If you have a preference for provider or location then please let me know.

## 2021-11-10 NOTE — Addendum Note (Signed)
Addended by: Beatrice Lecher D on: 11/10/2021 08:40 AM   Modules accepted: Orders

## 2021-11-10 NOTE — Telephone Encounter (Signed)
Orders Placed This Encounter  Procedures   Ambulatory referral to Cardiology    Referral Priority:   Routine    Referral Type:   Consultation    Referral Reason:   Specialty Services Required    Referred to Provider:   Lelon Perla, MD    Requested Specialty:   Cardiology    Number of Visits Requested:   1

## 2021-11-10 NOTE — Addendum Note (Signed)
Addended by: Narda Rutherford on: 11/10/2021 07:24 AM   Modules accepted: Orders

## 2021-11-17 ENCOUNTER — Ambulatory Visit (INDEPENDENT_AMBULATORY_CARE_PROVIDER_SITE_OTHER): Payer: Medicare Other | Admitting: Family Medicine

## 2021-11-21 DIAGNOSIS — G4733 Obstructive sleep apnea (adult) (pediatric): Secondary | ICD-10-CM | POA: Diagnosis not present

## 2021-11-23 NOTE — Addendum Note (Signed)
Addended by: Narda Rutherford on: 11/23/2021 02:07 PM   Modules accepted: Orders

## 2021-11-30 ENCOUNTER — Encounter: Payer: Self-pay | Admitting: Cardiology

## 2021-11-30 DIAGNOSIS — R9431 Abnormal electrocardiogram [ECG] [EKG]: Secondary | ICD-10-CM | POA: Insufficient documentation

## 2021-11-30 NOTE — Progress Notes (Unsigned)
Cardiology Office Note   Date:  11/30/2021   ID:  Patricia Waters, DOB 12/17/1955, MRN [id number]  PCP:  Patricia Marry, MD  Cardiologist:   None Referring:  ***  No chief complaint on file.     History of Present Illness: Patricia Waters is a 66 y.o. female who presents for evaluation of an abnormal EKG that was noted on routine follow up.  ***       Past Medical History:  Diagnosis Date   Anemia    Arthritis    Back pain    Bilateral swelling of feet    Breast cancer ([place])    at age 66   Constipation    Family history of breast cancer    Family history of colon cancer    Family history of prostate cancer in father    Family history of uterine cancer    History of radiation therapy    Joint pain    Lymph edema    right arm    Musculoskeletal pain    Other fatigue    PONV (postoperative nausea and vomiting)    Pre-diabetes    Shortness of breath on exertion    Sleep apnea    pt has nose pillows for cpap per pt - will bring DOS    Vertigo     Past Surgical History:  Procedure Laterality Date   BACK SURGERY     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     BILATERAL TOTAL MASTECTOMY WITH AXILLARY LYMPH NODE DISSECTION Bilateral 2005   BREAST LUMPECTOMY  1994   right   CERVICAL FUSION  1995   C5-C7   IR IMAGING GUIDED PORT INSERTION  09/11/2019   MASTECTOMY  2005   Bilat    ROBOTIC ASSISTED TOTAL HYSTERECTOMY WITH BILATERAL SALPINGO OOPHERECTOMY N/A 08/14/2019   Procedure: XI ROBOTIC ASSISTED TOTAL HYSTERECTOMY WITH BILATERAL SALPINGO OOPHORECTOMY;  Surgeon: Patricia Amber, MD;  Location: [facility];  Service: Gynecology;  Laterality: N/A;   SENTINEL NODE BIOPSY N/A 08/14/2019   Procedure: SENTINEL NODE BIOPSY;  Surgeon: Patricia Amber, MD;  Location: [facility];  Service: Gynecology;  Laterality: N/A;   WISDOM TOOTH EXTRACTION Bilateral 2004     Current Outpatient Medications  Medication Sig Dispense Refill   AMBULATORY NON FORMULARY  MEDICATION Medication Name: CPAP with humidifier with 11 cm water pressure.  Dx OSA.  See attached Sleep Study.  Please fax to [place]. 1 Units 0   azelastine (ASTELIN) 0.1 % nasal spray Place into both nostrils as needed for rhinitis. Use in each nostril as directed     CALCIUM PO Take 1 tablet by mouth daily.     DULoxetine (CYMBALTA) 60 MG capsule TAKE 1 CAPSULE BY MOUTH DAILY 90 capsule 3   gabapentin (NEURONTIN) 300 MG capsule TAKE 2 CAPSULES BY MOUTH  DAILY 180 capsule 3   Krill Oil 500 MG CAPS Take 1 capsule by mouth daily.     lidocaine-prilocaine (EMLA) cream Apply 1 application topically as needed. 30 g 3   Multiple Vitamins-Minerals (CENTRUM SILVER 50+WOMEN) TABS Take 1 capsule by mouth daily.     Multiple Vitamins-Minerals (PRESERVISION AREDS) CAPS Take 1 capsule by mouth daily.     rOPINIRole (REQUIP) 0.5 MG tablet TAKE 1 TABLET BY MOUTH AT  BEDTIME 100 tablet 3   vitamin C (ASCORBIC ACID) 500 MG tablet Take 500 mg by mouth daily.     No current facility-administered medications  for this visit.    Allergies:   Patient has no known allergies.    Social History:  The patient  reports that she quit smoking about 15 years ago. Her smoking use included cigarettes. She has never used smokeless tobacco. She reports that she does not currently use alcohol. She reports that she does not use drugs.   Family History:  The patient's ***family history includes Alcoholism in her father; Breast cancer (age of onset: 45) in her sister; Breast cancer (age of onset: 57) in her mother; Cancer in her father and mother; Cervical cancer (age of onset: 74) in her cousin; Colon cancer (age of onset: 32) in her father; Diabetes in her father and paternal grandmother; Endometrial cancer (age of onset: 75) in her mother; Heart attack in her maternal grandfather, maternal grandmother, paternal grandfather, and paternal grandmother; Heart attack (age of onset: 45) in her father; Heart disease in her father;  Hyperlipidemia in her father, mother, and sister; Hypertension in her father and mother; Kidney cancer (age of onset: 66) in her maternal aunt; Lymphoma (age of onset: 26) in her maternal aunt; Obesity in her father and mother; Prostate cancer (age of onset: 13) in her father; Rheum arthritis in her maternal grandmother and paternal grandmother; Skin cancer in her maternal grandmother and mother; Sleep apnea in her father; Stroke in her mother.    ROS:  Please see the history of present illness.   Otherwise, review of systems are positive for {NONE DEFAULTED:18576}.   All other systems are reviewed and negative.    PHYSICAL EXAM: VS:  There were no vitals taken for this visit. , BMI There is no height or weight on file to calculate BMI. GENERAL:  Well appearing HEENT:  Pupils equal round and reactive, fundi not visualized, oral mucosa unremarkable NECK:  No jugular venous distention, waveform within normal limits, carotid upstroke brisk and symmetric, no bruits, no thyromegaly LYMPHATICS:  No cervical, inguinal adenopathy LUNGS:  Clear to auscultation bilaterally BACK:  No CVA tenderness CHEST:  Unremarkable HEART:  PMI not displaced or sustained,S1 and S2 within normal limits, no S3, no S4, no clicks, no rubs, *** murmurs ABD:  Flat, positive bowel sounds normal in frequency in pitch, no bruits, no rebound, no guarding, no midline pulsatile mass, no hepatomegaly, no splenomegaly EXT:  2 plus pulses throughout, no edema, no cyanosis no clubbing SKIN:  No rashes no nodules NEURO:  Cranial nerves II through XII grossly intact, motor grossly intact throughout PSYCH:  Cognitively intact, oriented to person place and time    EKG:  EKG {ACTION; IS/IS ZOX:09604540} ordered today. The ekg ordered today demonstrates ***   Recent Labs: 05/16/2021: TSH 2.770 11/02/2021: ALT 16; BUN 9; Creatinine 0.70; Hemoglobin 13.0; Platelet Count 199; Potassium 4.2; Sodium 137    Lipid Panel    Component  Value Date/Time   CHOL 208 (H) 11/17/2020 1048   TRIG 119 11/17/2020 1048   HDL 57 11/17/2020 1048   CHOLHDL 3.3 03/24/2019 1041   VLDL 28 11/25/2015 0931   LDLCALC 130 (H) 11/17/2020 1048   LDLCALC 111 (H) 03/24/2019 1041      Wt Readings from Last 3 Encounters:  11/04/21 237 lb 9.6 oz (107.8 kg)  10/20/21 230 lb (104.3 kg)  09/19/21 229 lb (103.9 kg)      Other studies Reviewed: Additional studies/ records that were reviewed today include: ***. Review of the above records demonstrates:  Please see elsewhere in the note.  ***   ASSESSMENT  AND PLAN:  Abnormal EKG:  ***   Current medicines are reviewed at length with the patient today.  The patient {ACTIONS; HAS/DOES NOT HAVE:19233} concerns regarding medicines.  The following changes have been made:  {PLAN; NO CHANGE:13088:s}  Labs/ tests ordered today include: *** No orders of the defined types were placed in this encounter.    Disposition:   FU with ***    Signed, Minus Breeding, MD  11/30/2021 4:00 PM    [place]

## 2021-12-01 ENCOUNTER — Ambulatory Visit: Payer: Medicare Other | Attending: Cardiology | Admitting: Cardiology

## 2021-12-01 ENCOUNTER — Ambulatory Visit: Payer: Medicare Other | Admitting: Cardiology

## 2021-12-01 ENCOUNTER — Encounter: Payer: Self-pay | Admitting: Cardiology

## 2021-12-01 VITALS — BP 120/72 | HR 65 | Ht 64.0 in | Wt 235.0 lb

## 2021-12-01 DIAGNOSIS — R9431 Abnormal electrocardiogram [ECG] [EKG]: Secondary | ICD-10-CM

## 2021-12-01 DIAGNOSIS — Z8249 Family history of ischemic heart disease and other diseases of the circulatory system: Secondary | ICD-10-CM | POA: Diagnosis not present

## 2021-12-01 NOTE — Patient Instructions (Addendum)
Medication Instructions:  Your Physician recommend you continue on your current medication as directed.    *If you need a refill on your cardiac medications before your next appointment, please call your pharmacy*   Lab Work: None ordered today   Testing/Procedures: CT coronary calcium score.   Test locations:  [place]   This is $99 out of pocket.   Coronary CalciumScan A coronary calcium scan is an imaging test used to look for deposits of calcium and other fatty materials (plaques) in the inner lining of the blood vessels of the heart (coronary arteries). These deposits of calcium and plaques can partly clog and narrow the coronary arteries without producing any symptoms or warning signs. This puts a person at risk for a heart attack. This test can detect these deposits before symptoms develop. Tell a health care provider about: Any allergies you have. All medicines you are taking, including vitamins, herbs, eye drops, creams, and over-the-counter medicines. Any problems you or family members have had with anesthetic medicines. Any blood disorders you have. Any surgeries you have had. Any medical conditions you have. Whether you are pregnant or may be pregnant. What are the risks? Generally, this is a safe procedure. However, problems may occur, including: Harm to a pregnant woman and her unborn baby. This test involves the use of radiation. Radiation exposure can be dangerous to a pregnant woman and her unborn baby. If you are pregnant, you generally should not have this procedure done. Slight increase in the risk of cancer. This is because of the radiation involved in the test. What happens before the procedure? No preparation is needed for this procedure. What happens during the procedure? You will undress and remove any jewelry around your neck or chest. You will put on a hospital gown. Sticky electrodes will be placed on your chest. The electrodes  will be connected to an electrocardiogram (ECG) machine to record a tracing of the electrical activity of your heart. A CT scanner will take pictures of your heart. During this time, you will be asked to lie still and hold your breath for 2-3 seconds while a picture of your heart is being taken. The procedure may vary among health care providers and hospitals. What happens after the procedure? You can get dressed. You can return to your normal activities. It is up to you to get the results of your test. Ask your health care provider, or the department that is doing the test, when your results will be ready. Summary A coronary calcium scan is an imaging test used to look for deposits of calcium and other fatty materials (plaques) in the inner lining of the blood vessels of the heart (coronary arteries). Generally, this is a safe procedure. Tell your health care provider if you are pregnant or may be pregnant. No preparation is needed for this procedure. A CT scanner will take pictures of your heart. You can return to your normal activities after the scan is done. This information is not intended to replace advice given to you by your health care provider. Make sure you discuss any questions you have with your health care provider. Document Released: 07/15/2007 Document Revised: 12/06/2015 Document Reviewed: 12/06/2015 Elsevier Interactive Patient Education  2017 [place]: At [facility], you and your health needs are our priority.  As part of our continuing mission to provide you with exceptional heart care, we have created designated Provider Care Teams.  These Care Teams include your  primary Cardiologist (physician) and Advanced Practice Providers (APPs -  Physician Assistants and Nurse Practitioners) who all work together to provide you with the care you need, when you need it.  We recommend signing up for the patient portal called "MyChart".  Sign up information  is provided on this After Visit Summary.  MyChart is used to connect with patients for Virtual Visits (Telemedicine).  Patients are able to view lab/test results, encounter notes, upcoming appointments, etc.  Non-urgent messages can be sent to your provider as well.   To learn more about what you can do with MyChart, go to [email or web address].    Your next appointment:   As needed  The format for your next appointment:   In Person  Provider:   Minus Breeding, MD

## 2021-12-07 ENCOUNTER — Ambulatory Visit (HOSPITAL_BASED_OUTPATIENT_CLINIC_OR_DEPARTMENT_OTHER)
Admission: RE | Admit: 2021-12-07 | Discharge: 2021-12-07 | Disposition: A | Discharge status: Home / Self Care | Payer: Medicare Other | Source: Ambulatory Visit | Attending: Cardiology | Admitting: Cardiology

## 2021-12-07 DIAGNOSIS — Z8249 Family history of ischemic heart disease and other diseases of the circulatory system: Secondary | ICD-10-CM | POA: Insufficient documentation

## 2021-12-19 ENCOUNTER — Ambulatory Visit (INDEPENDENT_AMBULATORY_CARE_PROVIDER_SITE_OTHER): Payer: Medicare Other | Admitting: Family Medicine

## 2021-12-19 ENCOUNTER — Encounter: Payer: Self-pay | Admitting: Family Medicine

## 2021-12-19 VITALS — BP 125/66 | HR 75 | Ht 64.0 in | Wt 239.0 lb

## 2021-12-19 DIAGNOSIS — E785 Hyperlipidemia, unspecified: Secondary | ICD-10-CM | POA: Diagnosis not present

## 2021-12-19 DIAGNOSIS — R7301 Impaired fasting glucose: Secondary | ICD-10-CM

## 2021-12-19 DIAGNOSIS — I719 Aortic aneurysm of unspecified site, without rupture: Secondary | ICD-10-CM | POA: Insufficient documentation

## 2021-12-19 DIAGNOSIS — I7121 Aneurysm of the ascending aorta, without rupture: Secondary | ICD-10-CM | POA: Diagnosis not present

## 2021-12-19 DIAGNOSIS — Z86000 Personal history of in-situ neoplasm of breast: Secondary | ICD-10-CM

## 2021-12-19 LAB — POCT GLYCOSYLATED HEMOGLOBIN (HGB A1C): Hemoglobin A1C: 5.3 % (ref 4.0–5.6)

## 2021-12-19 MED ORDER — ATORVASTATIN CALCIUM 20 MG PO TABS: 20.0000 mg | ORAL_TABLET | Freq: Every day | ORAL | 3 refills | Status: DC

## 2021-12-19 NOTE — Progress Notes (Signed)
Established Patient Office Visit  Subjective   Patient ID: Patricia Waters, female    DOB: 05/03/1955  Age: 66 y.o. MRN: [id number]  Chief Complaint  Patient presents with   ifg    HPI  Impaired fasting glucose-no increased thirst or urination. No symptoms consistent with hypoglycemia.  Had recent Cardiac CT evaluation done.  Showed a 4 cm aortic ascending aneurysm.    Dr. Percival Spanish, her cardiologist recommended LDL goal less than 100.  And recommended potentially starting Lipitor 20 mg daily with a repeat lipid profile in 3 months.  LDL 130 October 2022.  She is also been thinking about getting her port out since she is been cancer free for quite some time and that when she has not had to get it flushed regularly.  Plus her co-pay has gone to go up with her current Medicare plan for having it flushed.  She is helping to take care of of her wife who has not been doing well with her current medical problems.  She just had a pacemaker placed.  Has lost a lot of weight.    ROS    Objective:     BP 125/66 (BP Location: Left Arm, Patient Position: Sitting, Cuff Size: Large)   Pulse 75   Ht '5\' 4"'$  (1.626 m)   Wt 239 lb (108.4 kg)   SpO2 96%   BMI 41.02 kg/m    Physical Exam Vitals and nursing note reviewed.  Constitutional:      Appearance: She is well-developed.  HENT:     Head: Normocephalic and atraumatic.  Cardiovascular:     Rate and Rhythm: Normal rate and regular rhythm.     Heart sounds: Normal heart sounds.  Pulmonary:     Effort: Pulmonary effort is normal.     Breath sounds: Normal breath sounds.  Skin:    General: Skin is warm and dry.  Neurological:     Mental Status: She is alert and oriented to person, place, and time.  Psychiatric:        Behavior: Behavior normal.      Results for orders placed or performed in visit on 12/19/21  POCT glycosylated hemoglobin (Hb A1C)  Result Value Ref Range   Hemoglobin A1C 5.3 4.0 - 5.6 %   HbA1c POC (<>  result, manual entry)     HbA1c, POC (prediabetic range)     HbA1c, POC (controlled diabetic range)        The 10-year ASCVD risk score (Arnett DK, et al., 2019) is: 5.4%    Assessment & Plan:   Problem List Items Addressed This Visit       Cardiovascular and Mediastinum   Aortic aneurysm ([place])    4 cm ascending thoracic aortic aneurysm. Will be followed yearly by Cards..        Relevant Medications   atorvastatin (LIPITOR) 20 MG tablet     Endocrine   IFG (impaired fasting glucose) - Primary    A1c looks phenomenal today at 5.3.  Continue to work on [employer] and regular exercise.  She is considering doing a trial of go low in January after Holidays.      Relevant Orders   POCT glycosylated hemoglobin (Hb A1C) (Completed)     Other   Hyperlipidemia LDL goal <100   Relevant Medications   atorvastatin (LIPITOR) 20 MG tablet   Other Relevant Orders   Lipid Panel w/reflex Direct LDL   Hepatic function panel   History of  ductal carcinoma in situ (DCIS) of breast    Follow-up next month and is thinking about getting her port removed.       Return in about 6 months (around 06/19/2022) for Pre diabetes .    Beatrice Lecher, MD

## 2021-12-19 NOTE — Assessment & Plan Note (Signed)
A1c looks phenomenal today at 5.3.  Continue to work on [employer] and regular exercise.  She is considering doing a trial of go low in January after Holidays.

## 2021-12-19 NOTE — Assessment & Plan Note (Addendum)
4 cm ascending thoracic aortic aneurysm. Will be followed yearly by Cards.Marland Kitchen

## 2021-12-19 NOTE — Patient Instructions (Signed)
Will need labs done in 3 months after you start the new cholesterol medication.  And get a go ahead and place the order for that and you can come to our office to have that drawn in February.

## 2021-12-19 NOTE — Assessment & Plan Note (Signed)
Follow-up next month and is thinking about getting her port removed.

## 2021-12-27 ENCOUNTER — Other Ambulatory Visit: Payer: Self-pay

## 2021-12-27 ENCOUNTER — Inpatient Hospital Stay: Payer: Medicare Other | Attending: Hematology and Oncology

## 2021-12-27 DIAGNOSIS — Z87891 Personal history of nicotine dependence: Secondary | ICD-10-CM | POA: Diagnosis not present

## 2021-12-27 DIAGNOSIS — Z79899 Other long term (current) drug therapy: Secondary | ICD-10-CM | POA: Insufficient documentation

## 2021-12-27 DIAGNOSIS — E669 Obesity, unspecified: Secondary | ICD-10-CM | POA: Insufficient documentation

## 2021-12-27 DIAGNOSIS — C541 Malignant neoplasm of endometrium: Secondary | ICD-10-CM | POA: Insufficient documentation

## 2021-12-27 LAB — CBC WITH DIFFERENTIAL (CANCER CENTER ONLY)
Abs Immature Granulocytes: 0.02 10*3/uL (ref 0.00–0.07)
Basophils Absolute: 0 10*3/uL (ref 0.0–0.1)
Basophils Relative: 1 %
Eosinophils Absolute: 0.1 10*3/uL (ref 0.0–0.5)
Eosinophils Relative: 1 %
HCT: 38.1 % (ref 36.0–46.0)
Hemoglobin: 13 g/dL (ref 12.0–15.0)
Immature Granulocytes: 0 %
Lymphocytes Relative: 30 %
Lymphs Abs: 2.3 10*3/uL (ref 0.7–4.0)
MCH: 32.9 pg (ref 26.0–34.0)
MCHC: 34.1 g/dL (ref 30.0–36.0)
MCV: 96.5 fL (ref 80.0–100.0)
Monocytes Absolute: 0.5 10*3/uL (ref 0.1–1.0)
Monocytes Relative: 7 %
Neutro Abs: 4.8 10*3/uL (ref 1.7–7.7)
Neutrophils Relative %: 61 %
Platelet Count: 236 10*3/uL (ref 150–400)
RBC: 3.95 MIL/uL (ref 3.87–5.11)
RDW: 12.1 % (ref 11.5–15.5)
WBC Count: 7.8 10*3/uL (ref 4.0–10.5)
nRBC: 0 % (ref 0.0–0.2)

## 2021-12-27 LAB — CMP (CANCER CENTER ONLY)
ALT: 14 U/L (ref 0–44)
AST: 18 U/L (ref 15–41)
Albumin: 4.5 g/dL (ref 3.5–5.0)
Alkaline Phosphatase: 113 U/L (ref 38–126)
Anion gap: 6 (ref 5–15)
BUN: 11 mg/dL (ref 8–23)
CO2: 30 mmol/L (ref 22–32)
Calcium: 9.9 mg/dL (ref 8.9–10.3)
Chloride: 104 mmol/L (ref 98–111)
Creatinine: 0.74 mg/dL (ref 0.44–1.00)
GFR, Estimated: >60 mL/min (ref >60)
Glucose, Bld: 100 mg/dL — ABNORMAL HIGH (ref 70–99)
Potassium: 4 mmol/L (ref 3.5–5.1)
Sodium: 140 mmol/L (ref 135–145)
Total Bilirubin: 0.5 mg/dL (ref 0.3–1.2)
Total Protein: 7.5 g/dL (ref 6.5–8.1)

## 2021-12-27 MED ORDER — HEPARIN SOD (PORK) LOCK FLUSH 100 UNIT/ML IV SOLN
500.0000 [IU] | Freq: Once | INTRAVENOUS | Status: AC
  Administered 2021-12-27: 500 [IU]

## 2021-12-27 MED ORDER — SODIUM CHLORIDE 0.9% FLUSH
10.0000 mL | Freq: Once | INTRAVENOUS | Status: AC
  Administered 2021-12-27: 10 mL

## 2021-12-29 ENCOUNTER — Inpatient Hospital Stay (HOSPITAL_BASED_OUTPATIENT_CLINIC_OR_DEPARTMENT_OTHER): Payer: Medicare Other | Admitting: Hematology and Oncology

## 2021-12-29 ENCOUNTER — Encounter: Payer: Self-pay | Admitting: Hematology and Oncology

## 2021-12-29 ENCOUNTER — Other Ambulatory Visit: Payer: Self-pay

## 2021-12-29 VITALS — BP 133/58 | HR 64 | Temp 97.8°F | Resp 18 | Ht 64.0 in | Wt 239.2 lb

## 2021-12-29 DIAGNOSIS — Z79899 Other long term (current) drug therapy: Secondary | ICD-10-CM | POA: Diagnosis not present

## 2021-12-29 DIAGNOSIS — C541 Malignant neoplasm of endometrium: Secondary | ICD-10-CM | POA: Diagnosis not present

## 2021-12-29 DIAGNOSIS — E669 Obesity, unspecified: Secondary | ICD-10-CM

## 2021-12-29 DIAGNOSIS — Z87891 Personal history of nicotine dependence: Secondary | ICD-10-CM | POA: Diagnosis not present

## 2021-12-29 NOTE — Assessment & Plan Note (Signed)
The patient is completely asymptomatic Her last CT imaging showed no evidence of disease I recommend port removal and she is in agreement I plan to space out her appointment and we will see her in 9 months

## 2021-12-29 NOTE — Progress Notes (Signed)
[facility] OFFICE PROGRESS NOTE  Patient Care Team: Hali Marry, MD as PCP - General (Family Medicine) Minus Breeding, MD as PCP - Cardiology (Cardiology) Druscilla Brownie, MD (Dermatology) Kristeen Miss, MD (Neurosurgery) Guss Bunde, MD (Obstetrics and Gynecology)  ASSESSMENT & PLAN:  Endometrial cancer [facility]) The patient is completely asymptomatic Her last CT imaging showed no evidence of disease I recommend port removal and she is in agreement I plan to space out her appointment and we will see her in 9 months  Obesity, Class II, BMI 35-39.9 We had extensive discussions in the past regarding the role of weight loss as a preventive strategy to reduce risk of breast cancer and endometrial cancer recurrence She appears motivated to work hard on dietary changes and exercise  Orders Placed This Encounter  Procedures   IR REMOVAL TUN ACCESS W/ PORT W/O FL MOD SED    Standing Status:   Future    Standing Expiration Date:   12/30/2022    Order Specific Question:   Reason for exam:    Answer:   no need port    Order Specific Question:   Preferred Imaging Location?    Answer:   Why ([facility] only)    Standing Status:   Future    Standing Expiration Date:   12/30/2022   CBC with Differential (Cancer Center Only)    Standing Status:   Future    Standing Expiration Date:   12/30/2022    All questions were answered. The patient knows to call the clinic with any problems, questions or concerns. The total time spent in the appointment was 20 minutes encounter with patients including review of chart and various tests results, discussions about plan of care and coordination of care plan   [name], MD 12/29/2021 12:36 PM  INTERVAL HISTORY: Please see below for problem oriented charting. she returns for surveillance follow-up for history of treated endometrial cancer She is doing well except for recent weight gain Denies  abdominal pain, changes in bowel habits or bloating  REVIEW OF SYSTEMS:   Constitutional: Denies fevers, chills or abnormal weight loss Eyes: Denies blurriness of vision Ears, nose, mouth, throat, and face: Denies mucositis or sore throat Respiratory: Denies cough, dyspnea or wheezes Cardiovascular: Denies palpitation, chest discomfort or lower extremity swelling Gastrointestinal:  Denies nausea, heartburn or change in bowel habits Skin: Denies abnormal skin rashes Lymphatics: Denies new lymphadenopathy or easy bruising Neurological:Denies numbness, tingling or new weaknesses Behavioral/Psych: Mood is stable, no new changes  All other systems were reviewed with the patient and are negative.  I have reviewed the past medical history, past surgical history, social history and family history with the patient and they are unchanged from previous note.  ALLERGIES:  has No Known Allergies.  MEDICATIONS:  Current Outpatient Medications  Medication Sig Dispense Refill   AMBULATORY NON FORMULARY MEDICATION Medication Name: CPAP with humidifier with 11 cm water pressure.  Dx OSA.  See attached Sleep Study.  Please fax to [facility]. 1 Units 0   atorvastatin (LIPITOR) 20 MG tablet Take 1 tablet (20 mg total) by mouth at bedtime. 90 tablet 3   CALCIUM PO Take 1 tablet by mouth daily.     DULoxetine (CYMBALTA) 60 MG capsule TAKE 1 CAPSULE BY MOUTH DAILY 90 capsule 3   gabapentin (NEURONTIN) 300 MG capsule TAKE 2 CAPSULES BY MOUTH  DAILY 180 capsule 3   Krill Oil 500 MG CAPS Take 1  capsule by mouth daily.     lidocaine-prilocaine (EMLA) cream Apply 1 application topically as needed. 30 g 3   Multiple Vitamins-Minerals (CENTRUM SILVER 50+WOMEN) TABS Take 1 capsule by mouth daily.     Multiple Vitamins-Minerals (PRESERVISION AREDS) CAPS Take 1 capsule by mouth daily.     rOPINIRole (REQUIP) 0.5 MG tablet TAKE 1 TABLET BY MOUTH AT  BEDTIME 100 tablet 3   vitamin C (ASCORBIC ACID) 500 MG tablet Take  500 mg by mouth daily.     No current facility-administered medications for this visit.    SUMMARY OF ONCOLOGIC HISTORY: Oncology History Overview Note  MSI Stable Negative genetics   Endometrial cancer ([place])  07/28/2019 Pathology Results   Endometrium, biopsy - ENDOMETRIOID CARCINOMA - SEE COMMENT Microscopic Comment Based on the biopsy, the carcinoma appears FIGO grade 1.   08/01/2019 Imaging   CT abdomen and pelvis No evidence of abdominal or pelvic metastatic disease.   Small uterine fibroid.   Colonic diverticulosis, without radiographic evidence of diverticulitis.   Aortic Atherosclerosis (ICD10-I70.0).   08/14/2019 Surgery   Surgeon: Donaciano Eva  Operation: Robotic-assisted laparoscopic total hysterectomy with bilateral salpingoophorectomy, SLN biopsy     Operative Findings:  : 8cm uterus which appeared grossly normal, normal appearing tubes and ovaries, no suspicious lymph nodes. Brisk oozing from skin incisions.    09/03/2019 Cancer Staging   Staging form: Corpus Uteri - Carcinoma and Carcinosarcoma, AJCC 8th Edition - Pathologic stage from 09/03/2019: FIGO Stage IIIA (pT3a, pN0, cM0) - Signed by [name], MD on 09/03/2019   09/04/2019 Pathology Results   FINAL MICROSCOPIC DIAGNOSIS:   A. LYMPH NODE, SENTINEL RIGHT OBTURATOR PROXIMAL, BIOPSY:  -  No carcinoma identified in one lymph node (0/1)  -  See comment   B. LYMPH NODE, SENTINEL RIGHT OBTURATOR DISTAL, BIOPSY:  -  No carcinoma identified in one lymph node (0/1)  -  See comment   C. LYMPH NODE, SENTINEL LEFT EXTERNAL ILIAC, BIOPSY:  -  No carcinoma identified in one lymph node (0/1)  -  See comment   D. UTERUS AND BILATERAL ADNEXA, HYSTERECTOMY WITH SALPINGO-OOPHORECTOMY:   Uterus:  -  Endometrioid carcinoma, FIGO grade 1  -  Leiomyoma (1.8 cm; largest)  -  See oncology table and comment below   Cervix:  -  Cervix with squamous metaplasia  -  No carcinoma identified   Bilateral Ovaries:   -  Endometrioid carcinoma involving right ovary  -  No carcinoma involving left ovary  -  Endosalpingiosis   Bilateral Fallopian tubes:  -  No carcinoma identified   ONCOLOGY TABLE:   UTERUS, CARCINOMA OR CARCINOSARCOMA   Procedure: Total hysterectomy and bilateral salpingo-oophorectomy  Histologic type: Endometrioid carcinoma, NOS  Histologic Grade: FIGO grade 1  Myometrial invasion:       Depth of invasion: 3 mm       Myometrial thickness: 20 mm  Uterine Serosa Involvement: Not identified  Cervical stromal involvement: Not identified  Extent of involvement of other organs: Right ovary  Lymphovascular invasion: Not identified  Regional Lymph Nodes:       Examined:      3 Sentinel                               0 non-sentinel  3 total        Lymph nodes with metastasis: 0        Isolated tumor cells (<0.2 mm): 0        Micrometastasis:  (>0.2 mm and < 2.0 mm): 0        Macrometastasis: (>2.0 mm): 0        Extracapsular extension: N/A  Representative Tumor Block: D10  MMR / MSI testing: Pending  Pathologic Stage Classification (pTNM, AJCC 8th edition):  pT3a, pN0  Comments: Pancytokeratin performed on the lymph nodes is negative. The right ovary has a focus of carcinoma.     09/11/2019 Procedure   Placement of single lumen port a cath via right internal jugular vein. The catheter tip lies at the cavo-atrial junction. A power injectable port a cath was placed and is ready for immediate use.     09/12/2019 - 12/26/2019 Chemotherapy   The patient had carboplatin and taxol for chemotherapy treatment.     01/19/2020 Imaging   1. Interval total hysterectomy with bilateral salpingo oophorectomy. No findings to suggest recurrent or metastatic disease. 2. Tiny hiatal hernia. 3. Left colonic diverticulosis without diverticulitis. 4. Aortic Atherosclerosis (ICD10-I70.0).   02/18/2021 Imaging   1. No evidence of metastatic disease in the chest, abdomen  or pelvis. No evidence of a left chest wall mass to correlate with reported palpable abnormality. 2. Marked sigmoid diverticulosis. 3. Small hiatal hernia. 4. Aortic Atherosclerosis (ICD10-I70.0).     11/07/2021 Imaging   1. Status post hysterectomy without evidence of local recurrence or abdominopelvic metastatic disease. 2. Sigmoid colonic diverticulosis without findings of acute diverticulitis. 3. Small hiatal hernia. 4.  Aortic Atherosclerosis (ICD10-I70.0).       PHYSICAL EXAMINATION: ECOG PERFORMANCE STATUS: 0 - Asymptomatic  Vitals:   12/29/21 1059  BP: (!) 133/58  Pulse: 64  Resp: 18  Temp: 97.8 F (36.6 C)  SpO2: 99%   Filed Weights   12/29/21 1059  Weight: 239 lb 3.2 oz (108.5 kg)    GENERAL:alert, no distress and comfortable NEURO: alert & oriented x 3 with fluent speech, no focal motor/sensory deficits  LABORATORY DATA:  I have reviewed the data as listed    Component Value Date/Time   NA 140 12/27/2021 1319   NA 141 11/17/2020 1048   NA 141 01/20/2014 1604   K 4.0 12/27/2021 1319   K 4.2 01/20/2014 1604   CL 104 12/27/2021 1319   CO2 30 12/27/2021 1319   CO2 28 01/20/2014 1604   GLUCOSE 100 (H) 12/27/2021 1319   GLUCOSE 99 01/20/2014 1604   BUN 11 12/27/2021 1319   BUN 15 11/17/2020 1048   BUN 14.6 01/20/2014 1604   CREATININE 0.74 12/27/2021 1319   CREATININE 0.78 03/24/2019 1041   CREATININE 0.8 01/20/2014 1604   CALCIUM 9.9 12/27/2021 1319   CALCIUM 9.8 01/20/2014 1604   PROT 7.5 12/27/2021 1319   PROT 7.2 11/17/2020 1048   PROT 7.3 01/20/2014 1604   ALBUMIN 4.5 12/27/2021 1319   ALBUMIN 4.6 11/17/2020 1048   ALBUMIN 4.0 01/20/2014 1604   AST 18 12/27/2021 1319   AST 22 01/20/2014 1604   ALT 14 12/27/2021 1319   ALT 20 01/20/2014 1604   ALKPHOS 113 12/27/2021 1319   ALKPHOS 134 01/20/2014 1604   BILITOT 0.5 12/27/2021 1319   BILITOT <0.20 01/20/2014 1604   GFRNONAA >60 12/27/2021 1319   GFRNONAA 81 03/24/2019 1041   GFRAA >60  10/24/2019 0737   GFRAA 94 03/24/2019 1041  No results found for: "SPEP", "UPEP"  Lab Results  Component Value Date   WBC 7.8 12/27/2021   NEUTROABS 4.8 12/27/2021   HGB 13.0 12/27/2021   HCT 38.1 12/27/2021   MCV 96.5 12/27/2021   PLT 236 12/27/2021      Chemistry      Component Value Date/Time   NA 140 12/27/2021 1319   NA 141 11/17/2020 1048   NA 141 01/20/2014 1604   K 4.0 12/27/2021 1319   K 4.2 01/20/2014 1604   CL 104 12/27/2021 1319   CO2 30 12/27/2021 1319   CO2 28 01/20/2014 1604   BUN 11 12/27/2021 1319   BUN 15 11/17/2020 1048   BUN 14.6 01/20/2014 1604   CREATININE 0.74 12/27/2021 1319   CREATININE 0.78 03/24/2019 1041   CREATININE 0.8 01/20/2014 1604      Component Value Date/Time   CALCIUM 9.9 12/27/2021 1319   CALCIUM 9.8 01/20/2014 1604   ALKPHOS 113 12/27/2021 1319   ALKPHOS 134 01/20/2014 1604   AST 18 12/27/2021 1319   AST 22 01/20/2014 1604   ALT 14 12/27/2021 1319   ALT 20 01/20/2014 1604   BILITOT 0.5 12/27/2021 1319   BILITOT <0.20 01/20/2014 1604       RADIOGRAPHIC STUDIES: I have personally reviewed the radiological images as listed and agreed with the findings in the report. CT CARDIAC SCORING (SELF PAY ONLY)  Addendum Date: 12/07/2021   ADDENDUM REPORT: 12/07/2021 15:00 EXAM: OVER-READ INTERPRETATION  CT CHEST The following report is an over-read performed by radiologist Dr. Sabino Dick [facility] on 12/07/2021. This over-read does not include interpretation of cardiac or coronary anatomy or pathology. The coronary calcium score interpretation by the cardiologist is attached. COMPARISON:  February 17, 2021. FINDINGS: 4 cm ascending thoracic aortic aneurysm is noted. Visualized mediastinum is unremarkable. Visualized portion of upper abdomen is unremarkable. Visualized pulmonary parenchyma is unremarkable. Visualized skeleton is unremarkable. IMPRESSION: 4 cm ascending thoracic aortic aneurysm. Recommend annual imaging  followup by CTA or MRA. This recommendation follows 2010 ACCF/AHA/AATS/ACR/ASA/SCA/SCAI/SIR/STS/SVM Guidelines for the Diagnosis and Management of Patients with Thoracic Aortic Disease. Circulation. 2010; 121: A250-N397. Aortic aneurysm NOS (ICD10-I71.9). Electronically Signed   By: Marijo Conception M.D.   On: 12/07/2021 15:00   Result Date: 12/07/2021 CLINICAL DATA:  Cardiovascular Disease Risk stratification EXAM: Coronary Calcium Score TECHNIQUE: A gated, non-contrast computed tomography scan of the heart was performed using 47m slice thickness. Axial images were analyzed on a dedicated workstation. Calcium scoring of the coronary arteries was performed using the Agatston method. FINDINGS: Coronary arteries: Normal origins. Coronary Calcium Score: Total: 0 Pericardium: Normal. Catheter in SVC. Ascending Aorta: Normal caliber.  Aortic atherosclerosis. [facility]cardiac: [facility] IMPRESSION: 1.  Coronary calcium score of 0. 2.  Aortic atherosclerosis. RECOMMENDATIONS: Coronary artery calcium (CAC) score is a strong predictor of incident coronary heart disease (CHD) and provides predictive information beyond traditional risk factors. CAC scoring is reasonable to use in the decision to withhold, postpone, or initiate statin therapy in intermediate-risk or selected borderline-risk asymptomatic adults (age 65-75years and LDL-C >=70 to <190 mg/dL) who do not have diabetes or established atherosclerotic cardiovascular disease (ASCVD).* In intermediate-risk (10-year ASCVD risk >=7.5% to <20%) adults or selected borderline-risk (10-year ASCVD risk >=5% to <7.5%) adults in whom a CAC score is measured for the purpose of making a treatment decision the following recommendations have been made: If CAC=0, it is reasonable to withhold statin therapy and reassess in 5  to 10 years, as long as higher risk conditions are absent (diabetes mellitus, family history of premature CHD in first degree  relatives (males <55 years; females <65 years), cigarette smoking, or LDL >=190 mg/dL). If CAC is 1 to 99, it is reasonable to initiate statin therapy for patients >=71 years of age. If CAC is >=100 or >=75th percentile, it is reasonable to initiate statin therapy at any age. Cardiology referral should be considered for patients with CAC scores >=400 or >=75th percentile. *2018 AHA/ACC/AACVPR/AAPA/ABC/ACPM/ADA/AGS/APhA/ASPC/NLA/PCNA Guideline on the Management of Blood Cholesterol: A Report of the American College of Cardiology/American Heart Association Task Force on Clinical Practice Guidelines. J Am Coll Cardiol. 2019;73(24):3168-3209. Electronically Signed: By: Candee Furbish M.D. On: 12/07/2021 14:20

## 2021-12-29 NOTE — Assessment & Plan Note (Signed)
We had extensive discussions in the past regarding the role of weight loss as a preventive strategy to reduce risk of breast cancer and endometrial cancer recurrence She appears motivated to work hard on dietary changes and exercise

## 2022-01-04 ENCOUNTER — Ambulatory Visit
Admission: EM | Admit: 2022-01-04 | Discharge: 2022-01-04 | Disposition: A | Discharge status: Home / Self Care | Payer: Medicare Other

## 2022-01-04 ENCOUNTER — Encounter: Payer: Self-pay | Admitting: Emergency Medicine

## 2022-01-04 ENCOUNTER — Other Ambulatory Visit: Payer: Self-pay

## 2022-01-04 DIAGNOSIS — R09A9 Foreign body sensation, other site: Secondary | ICD-10-CM

## 2022-01-04 NOTE — Discharge Instructions (Signed)
No cotton or other foreign object seen in your ear today - we irrigated your ear just in case. The feeling of something being in there should resolve over the course of the next day or so. Follow-up with PCP or ENT if persistent concerns or if develop pain.

## 2022-01-04 NOTE — ED Triage Notes (Signed)
Qtip in Rt ear, happened this morning

## 2022-01-04 NOTE — ED Provider Notes (Signed)
[name]    CSN: [id number] Arrival date & time: 01/04/22  1131      History   Chief Complaint Chief Complaint  Patient presents with   Foreign Body in Ear    HPI Patricia Waters is a 66 y.o. female.   Patient presents with concerns of Qtip cotton in her right ear. She was using the Qtip to clean her ear and when she took it out cotton was missing from the tip. She reports it feels like something is in her right ear but denies any pain. She reports decreased hearing on the right at baseline and denies notable change. She hasn't tried anything for it.   The history is provided by the patient.  Foreign Body in Ear Pertinent negatives include no headaches.    Past Medical History:  Diagnosis Date   Anemia    Arthritis    Bilateral swelling of feet    Breast cancer ([place])    at age 77   Constipation    History of radiation therapy    Lymph edema    right arm    Musculoskeletal pain    Other fatigue    PONV (postoperative nausea and vomiting)    Pre-diabetes    Sleep apnea    Uses CPAP   Vertigo     Patient Active Problem List   Diagnosis Date Noted   Aortic aneurysm ([place]) 12/19/2021   Hyperlipidemia LDL goal <100 12/19/2021   Nonspecific abnormal electrocardiogram (ECG) (EKG) 11/30/2021   Primary localized osteoarthritis of left knee 09/27/2021   Prediabetes 09/19/2021   Hypoglycemia 08/10/2021   Primary osteoarthritis of left ankle 06/15/2021   Vitamin D deficiency 06/14/2021   Diverticulosis of colon 02/22/2021   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   Family history of colon cancer    Family history of breast cancer    Family history of uterine cancer    Family history of prostate cancer in father    Endometrial cancer ([place]) 08/14/2019   Caregiver stress 03/24/2019   Hx of bilateral mastectomy 11/25/2015   History of breast cancer in female 07/21/2014   Family history of breast cancer in first degree relative  07/21/2014   History of ductal carcinoma in situ (DCIS) of breast 01/20/2014   Lymphedema 10/23/2013   BMI 38.0-38.9,adult 09/25/2013   Periodic limb movement 06/13/2013   Plantar fasciitis, bilateral 06/04/2013   Obesity, Class II, BMI 35-39.9 03/19/2013   RLS (restless legs syndrome) 03/19/2013   Central sleep apnea 03/19/2013   Sleep apnea, obstructive 03/19/2013   IFG (impaired fasting glucose) 12/14/2011   Hearing loss on right 01/12/2011   POSTMENOPAUSAL STATUS 01/10/2010   SPINAL STENOSIS, LUMBAR 07/06/2009   OTH NONSPC ABN FINDNG RAD&OTH EXM BODY STRUCTURE 03/30/2009   Low back pain 04/23/2007    Past Surgical History:  Procedure Laterality Date   BACK SURGERY     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     BILATERAL TOTAL MASTECTOMY WITH AXILLARY LYMPH NODE DISSECTION Bilateral 2005   BREAST LUMPECTOMY  1994   right   CERVICAL FUSION  1995   C5-C7   IR IMAGING GUIDED PORT INSERTION  09/11/2019   MASTECTOMY  2005   Bilat    ROBOTIC ASSISTED TOTAL HYSTERECTOMY WITH BILATERAL SALPINGO OOPHERECTOMY N/A 08/14/2019   Procedure: XI ROBOTIC ASSISTED TOTAL HYSTERECTOMY WITH BILATERAL SALPINGO OOPHORECTOMY;  Surgeon: [name], MD;  Location: [facility];  Service: Gynecology;  Laterality: N/A;  SENTINEL NODE BIOPSY N/A 08/14/2019   Procedure: SENTINEL NODE BIOPSY;  Surgeon: [name], MD;  Location: [facility];  Service: Gynecology;  Laterality: N/A;   WISDOM TOOTH EXTRACTION Bilateral 2004    OB History     Gravida  0   Para  0   Term  0   Preterm  0   AB  0   Living  0      SAB  0   IAB  0   Ectopic  0   Multiple  0   Live Births  0            Home Medications    Prior to Admission medications   Medication Sig Start Date End Date Taking? Authorizing Provider  AMBULATORY NON FORMULARY MEDICATION Medication Name: CPAP with humidifier with 11 cm water pressure.  Dx OSA.  See attached Sleep Study.  Please fax to [place] [facility]. 01/02/20    Hali Marry, MD  atorvastatin (LIPITOR) 20 MG tablet Take 1 tablet (20 mg total) by mouth at bedtime. 12/19/21   Hali Marry, MD  CALCIUM PO Take 1 tablet by mouth daily.    [provider]  DULoxetine (CYMBALTA) 60 MG capsule TAKE 1 CAPSULE BY MOUTH DAILY 06/02/21   Hali Marry, MD  gabapentin (NEURONTIN) 300 MG capsule TAKE 2 CAPSULES BY MOUTH  DAILY 09/02/21   Hali Marry, MD  Krill Oil 500 MG CAPS Take 1 capsule by mouth daily.    [provider]  lidocaine-prilocaine (EMLA) cream Apply 1 application topically as needed. 02/22/21   [name], MD  Multiple Vitamins-Minerals (CENTRUM SILVER 50+WOMEN) TABS Take 1 capsule by mouth daily.    [provider]  Multiple Vitamins-Minerals (PRESERVISION AREDS) CAPS Take 1 capsule by mouth daily.    [provider]  rOPINIRole (REQUIP) 0.5 MG tablet TAKE 1 TABLET BY MOUTH AT  BEDTIME 11/03/21   Hali Marry, MD  vitamin C (ASCORBIC ACID) 500 MG tablet Take 500 mg by mouth daily.    [provider]    Family History Family History  Problem Relation Age of Onset   Stroke Mother    Hyperlipidemia Mother    Skin cancer Mother    Breast cancer Mother 61       ? recurrence at 75   Endometrial cancer Mother 3   Hypertension Mother    Cancer Mother    Obesity Mother    Diabetes Father    Hyperlipidemia Father    Heart attack Father 55       CABG   Colon cancer Father 78   Prostate cancer Father 19       Gleason 9   Hypertension Father    Heart disease Father    Cancer Father    Sleep apnea Father    Alcoholism Father    Obesity Father    Hyperlipidemia Sister    Breast cancer Sister 1   Heart attack Maternal Grandmother    Skin cancer Maternal Grandmother    Rheum arthritis Maternal Grandmother    Heart attack Maternal Grandfather    Diabetes Paternal Grandmother    Heart attack Paternal Grandmother    Rheum arthritis Paternal Grandmother     Heart attack Paternal Grandfather    Lymphoma Maternal Aunt 65   Kidney cancer Maternal Aunt 60   Cervical cancer Cousin 51    Social History Social History   Tobacco Use   Smoking status: Former  Types: Cigarettes    Quit date: 10/31/2006    Years since quitting: 15.1   Smokeless tobacco: Never  Vaping Use   Vaping Use: Never used  Substance Use Topics   Alcohol use: Yes   Drug use: No     Allergies   Patient has no known allergies.   Review of Systems Review of Systems  Constitutional:  Negative for fatigue and fever.  HENT:  Positive for hearing loss (at baseline). Negative for ear discharge and ear pain.   Neurological:  Negative for dizziness and headaches.     Physical Exam Triage Vital Signs ED Triage Vitals  Enc Vitals Group     BP 01/04/22 1151 128/84     Pulse Rate 01/04/22 1151 61     Resp 01/04/22 1151 16     Temp 01/04/22 1151 99.1 F (37.3 C)     Temp Source 01/04/22 1151 Oral     SpO2 01/04/22 1151 97 %     Weight 01/04/22 1152 239 lb (108.4 kg)     Height 01/04/22 1152 '5\' 4"'$  (1.626 m)     Head Circumference --      Peak Flow --      Pain Score 01/04/22 1152 0     Pain Loc --      Pain Edu? --      Excl. in [place]? --    No data found.  Updated Vital Signs BP 128/84 (BP Location: Left Arm)   Pulse 61   Temp 99.1 F (37.3 C) (Oral)   Resp 16   Ht '5\' 4"'$  (1.626 m)   Wt 239 lb (108.4 kg)   SpO2 97%   BMI 41.02 kg/m   Visual Acuity Right Eye Distance:   Left Eye Distance:   Bilateral Distance:    Right Eye Near:   Left Eye Near:    Bilateral Near:     Physical Exam Vitals and nursing note reviewed.  Constitutional:      General: She is not in acute distress. HENT:     Head: Normocephalic.     Right Ear: Tympanic membrane, ear canal and external ear normal.     Left Ear: Tympanic membrane, ear canal and external ear normal.     Ears:     Comments: No visible foreign body R canal Cardiovascular:     Rate and Rhythm: Normal  rate and regular rhythm.     Heart sounds: Normal heart sounds.  Pulmonary:     Effort: Pulmonary effort is normal.     Breath sounds: Normal breath sounds.  Neurological:     Mental Status: She is alert.  Psychiatric:        Mood and Affect: Mood normal.      UC Treatments / Results  Labs (all labs ordered are listed, but only abnormal results are displayed) Labs Reviewed - No data to display  EKG   Radiology No results found.  Procedures Procedures (including critical care time)  Medications Ordered in UC Medications - No data to display  Initial Impression / Assessment and Plan / UC Course  I have reviewed the triage vital signs and the nursing notes.  Pertinent labs & imaging results that were available during my care of the patient were reviewed by me and considered in my medical decision making (see chart for details).     No foreign body visible - may have come out on own before presenting. No indication of infection. Reassurance provided.  E/M: 1 acute uncomplicated illness, no data, low risk   Final Clinical Impressions(s) / UC Diagnoses   Final diagnoses:  Foreign body sensation in right ear canal     Discharge Instructions      No cotton or other foreign object seen in your ear today - we irrigated your ear just in case. The feeling of something being in there should resolve over the course of the next day or so. Follow-up with PCP or ENT if persistent concerns or if develop pain.      ED Prescriptions   None    PDMP not reviewed this encounter.   Delsa Sale, [place] 01/04/22 1217

## 2022-01-05 ENCOUNTER — Telehealth: Payer: Self-pay

## 2022-01-05 NOTE — Telephone Encounter (Signed)
TCT pt to follow up from recent visit. Pt denies any additional needs at this time.  

## 2022-02-06 ENCOUNTER — Ambulatory Visit (HOSPITAL_COMMUNITY)
Admission: RE | Admit: 2022-02-06 | Discharge: 2022-02-06 | Disposition: A | Discharge status: Home / Self Care | Payer: Medicare Other | Source: Ambulatory Visit | Attending: Hematology and Oncology | Admitting: Hematology and Oncology

## 2022-02-06 DIAGNOSIS — C541 Malignant neoplasm of endometrium: Secondary | ICD-10-CM | POA: Diagnosis not present

## 2022-02-06 DIAGNOSIS — Z452 Encounter for adjustment and management of vascular access device: Secondary | ICD-10-CM | POA: Insufficient documentation

## 2022-02-06 HISTORY — PX: IR REMOVAL TUN ACCESS W/ PORT W/O FL MOD SED: IMG2290

## 2022-02-06 MED ORDER — LIDOCAINE-EPINEPHRINE 1 %-1:100000 IJ SOLN
INTRAMUSCULAR | Status: AC
  Administered 2022-02-06: 3 mL via INTRADERMAL
  Filled 2022-02-06: qty 1

## 2022-02-13 ENCOUNTER — Ambulatory Visit: Payer: Medicare Other | Admitting: Cardiology

## 2022-02-21 DIAGNOSIS — G4733 Obstructive sleep apnea (adult) (pediatric): Secondary | ICD-10-CM | POA: Diagnosis not present

## 2022-03-07 ENCOUNTER — Ambulatory Visit (INDEPENDENT_AMBULATORY_CARE_PROVIDER_SITE_OTHER): Payer: Medicare Other

## 2022-03-07 ENCOUNTER — Ambulatory Visit (INDEPENDENT_AMBULATORY_CARE_PROVIDER_SITE_OTHER): Payer: Medicare Other | Admitting: Sports Medicine

## 2022-03-07 ENCOUNTER — Encounter: Payer: Self-pay | Admitting: Sports Medicine

## 2022-03-07 DIAGNOSIS — M19072 Primary osteoarthritis, left ankle and foot: Secondary | ICD-10-CM

## 2022-03-07 DIAGNOSIS — M25572 Pain in left ankle and joints of left foot: Secondary | ICD-10-CM

## 2022-03-07 MED ORDER — TRIAMCINOLONE ACETONIDE 40 MG/ML IJ SUSP
40.0000 mg | Freq: Once | INTRAMUSCULAR | Status: AC
  Administered 2022-03-07: 40 mg via INTRAMUSCULAR

## 2022-03-07 MED ORDER — CELECOXIB 200 MG PO CAPS: ORAL_CAPSULE | ORAL | 2 refills | Status: DC

## 2022-03-07 NOTE — Progress Notes (Signed)
    Procedures performed today:    Procedure: Real-time Ultrasound Guided injection of the left ankle joint Device: Samsung HS60  Verbal informed consent obtained.  Time-out conducted.  Noted no overlying erythema, induration, or other signs of local infection.  Skin prepped in a sterile fashion.  Local anesthesia: Topical Ethyl chloride.  With sterile technique and under real time ultrasound guidance: Arthritic joint noted, 1 cc Kenalog 40, 1 cc lidocaine, 1 cc bupivacaine injected easily Completed without difficulty  Advised to call if fevers/chills, erythema, induration, drainage, or persistent bleeding.  Images permanently stored and available for review in PACS.  Impression: Technically successful ultrasound guided injection.  Independent interpretation of notes and tests performed by another provider:   None.  Brief History, Exam, Impression, and Recommendations:    Primary osteoarthritis of left ankle Injection as above, last done October, the injections are losing efficacy. Adding additional home conditioning, I would like consultation with orthopedic foot and ankle surgery for discussion of fusion versus arthroplasty. Adding some Celebrex in the meantime.    ____________________________________________ Gwen Her. Dianah Field, M.D., ABFM., CAQSM., AME. Primary Care and Sports Medicine [facility]  Adjunct Professor of [employer] [facility] [employer]  Risk manager

## 2022-03-07 NOTE — Assessment & Plan Note (Signed)
Injection as above, last done October, the injections are losing efficacy. Adding additional home conditioning, I would like consultation with orthopedic foot and ankle surgery for discussion of fusion versus arthroplasty. Adding some Celebrex in the meantime.

## 2022-03-17 DIAGNOSIS — M19072 Primary osteoarthritis, left ankle and foot: Secondary | ICD-10-CM | POA: Diagnosis not present

## 2022-04-24 ENCOUNTER — Other Ambulatory Visit: Payer: Self-pay | Admitting: Family Medicine

## 2022-05-05 ENCOUNTER — Encounter: Payer: Self-pay | Admitting: Family Medicine

## 2022-05-11 ENCOUNTER — Telehealth: Payer: Self-pay | Admitting: Family Medicine

## 2022-05-11 NOTE — Telephone Encounter (Signed)
Called patient to schedule Medicare Annual Wellness Visit (AWV). Left message for patient to call back and schedule Medicare Annual Wellness Visit (AWV).  Last date of AWV: 10/20/2021  Please schedule an appointment at any time with [facility].  If any questions, please contact me at [phone].  Thank you ,  Cira Servant Patient Access Advocate II Direct Dial: [phone]

## 2022-05-22 ENCOUNTER — Telehealth: Payer: Self-pay

## 2022-05-22 NOTE — Telephone Encounter (Signed)
Pt called stating she needs a follow up appointment with Dr.Tucker.  Per Dr.Tucker's last office visit note pt was to be seen in March.  Pt scheduled with Warner Mccreedy NP on May 9th @ 2:30. Pt agreed to date/time

## 2022-05-25 DIAGNOSIS — G4733 Obstructive sleep apnea (adult) (pediatric): Secondary | ICD-10-CM | POA: Diagnosis not present

## 2022-06-01 ENCOUNTER — Other Ambulatory Visit: Payer: Self-pay | Admitting: Sports Medicine

## 2022-06-01 DIAGNOSIS — M19072 Primary osteoarthritis, left ankle and foot: Secondary | ICD-10-CM

## 2022-06-06 ENCOUNTER — Encounter: Payer: Self-pay | Admitting: Gynecologic Oncology

## 2022-06-06 DIAGNOSIS — E785 Hyperlipidemia, unspecified: Secondary | ICD-10-CM | POA: Diagnosis not present

## 2022-06-07 LAB — HEPATIC FUNCTION PANEL
AG Ratio: 1.7 (calc) (ref 1.0–2.5)
ALT: 14 U/L (ref 6–29)
AST: 16 U/L (ref 10–35)
Albumin: 4.2 g/dL (ref 3.6–5.1)
Alkaline phosphatase (APISO): 129 U/L (ref 37–153)
Bilirubin, Direct: 0.1 mg/dL (ref 0.0–0.2)
Globulin: 2.5 g/dL (calc) (ref 1.9–3.7)
Indirect Bilirubin: 0.4 mg/dL (calc) (ref 0.2–1.2)
Total Bilirubin: 0.5 mg/dL (ref 0.2–1.2)
Total Protein: 6.7 g/dL (ref 6.1–8.1)

## 2022-06-07 LAB — LIPID PANEL W/REFLEX DIRECT LDL
Cholesterol: 145 mg/dL (ref <200)
HDL: 66 mg/dL (ref >=50)
LDL Cholesterol (Calc): 60 mg/dL (calc)
Non-HDL Cholesterol (Calc): 79 mg/dL (calc) (ref <130)
Total CHOL/HDL Ratio: 2.2 (calc) (ref <5.0)
Triglycerides: 106 mg/dL (ref <150)

## 2022-06-07 NOTE — Progress Notes (Signed)
Hi Cindy, LDL cholesterol looks fantastic!  Liver function looks great.  Seems to be working well.  Lets continue with the current regimen.

## 2022-06-08 ENCOUNTER — Other Ambulatory Visit: Payer: Self-pay

## 2022-06-08 ENCOUNTER — Inpatient Hospital Stay: Payer: Medicare Other | Attending: Gynecologic Oncology | Admitting: Gynecologic Oncology

## 2022-06-08 VITALS — BP 134/69 | HR 59 | Temp 98.8°F | Ht 64.96 in | Wt 253.6 lb

## 2022-06-08 DIAGNOSIS — C541 Malignant neoplasm of endometrium: Secondary | ICD-10-CM

## 2022-06-08 DIAGNOSIS — Z90722 Acquired absence of ovaries, bilateral: Secondary | ICD-10-CM | POA: Diagnosis not present

## 2022-06-08 DIAGNOSIS — Z8542 Personal history of malignant neoplasm of other parts of uterus: Secondary | ICD-10-CM | POA: Diagnosis not present

## 2022-06-08 DIAGNOSIS — Z853 Personal history of malignant neoplasm of breast: Secondary | ICD-10-CM | POA: Diagnosis not present

## 2022-06-08 DIAGNOSIS — Z9221 Personal history of antineoplastic chemotherapy: Secondary | ICD-10-CM | POA: Insufficient documentation

## 2022-06-08 DIAGNOSIS — Z90710 Acquired absence of both cervix and uterus: Secondary | ICD-10-CM | POA: Diagnosis not present

## 2022-06-08 NOTE — Patient Instructions (Addendum)
It was good to see you today!  No concerning findings on today's exam. I do not see or feel any evidence of cancer recurrence.   Plan to follow up with Dr. Bertis Ruddy in August 2024 and our office 3 months after that.   As always, please call if you develop any new and concerning symptoms before your next visit.  Symptoms to report to your health care team include vaginal bleeding, rectal bleeding, bloating, weight loss without effort, new and persistent pain, new and  persistent fatigue, new leg swelling, new masses (i.e., bumps in your neck or groin), new and persistent cough, new and persistent nausea and vomiting, change in bowel or bladder habits, and any other concerns.

## 2022-06-08 NOTE — Progress Notes (Unsigned)
Gynecologic Oncology Return Clinic Visit  06/08/2022    Reason for Visit: Surveillance visit in the setting of uterine cancer  Treatment History: Oncology History Overview Note  MSI Stable Negative genetics   Endometrial cancer (HCC)  07/28/2019 Pathology Results   Endometrium, biopsy - ENDOMETRIOID CARCINOMA - SEE COMMENT Microscopic Comment Based on the biopsy, the carcinoma appears FIGO grade 1.   08/01/2019 Imaging   CT abdomen and pelvis No evidence of abdominal or pelvic metastatic disease.   Small uterine fibroid.   Colonic diverticulosis, without radiographic evidence of diverticulitis.   Aortic Atherosclerosis (ICD10-I70.0).   08/14/2019 Surgery   Surgeon: Quinn Axe  Operation: Robotic-assisted laparoscopic total hysterectomy with bilateral salpingoophorectomy, SLN biopsy     Operative Findings:  : 8cm uterus which appeared grossly normal, normal appearing tubes and ovaries, no suspicious lymph nodes. Brisk oozing from skin incisions.    09/03/2019 Cancer Staging   Staging form: Corpus Uteri - Carcinoma and Carcinosarcoma, AJCC 8th Edition - Pathologic stage from 09/03/2019: FIGO Stage IIIA (pT3a, pN0, cM0) - Signed by Artis Delay, MD on 09/03/2019   09/04/2019 Pathology Results   FINAL MICROSCOPIC DIAGNOSIS:   A. LYMPH NODE, SENTINEL RIGHT OBTURATOR PROXIMAL, BIOPSY:  -  No carcinoma identified in one lymph node (0/1)  -  See comment   B. LYMPH NODE, SENTINEL RIGHT OBTURATOR DISTAL, BIOPSY:  -  No carcinoma identified in one lymph node (0/1)  -  See comment   C. LYMPH NODE, SENTINEL LEFT EXTERNAL ILIAC, BIOPSY:  -  No carcinoma identified in one lymph node (0/1)  -  See comment   D. UTERUS AND BILATERAL ADNEXA, HYSTERECTOMY WITH SALPINGO-OOPHORECTOMY:   Uterus:  -  Endometrioid carcinoma, FIGO grade 1  -  Leiomyoma (1.8 cm; largest)  -  See oncology table and comment below   Cervix:  -  Cervix with squamous metaplasia  -  No carcinoma identified    Bilateral Ovaries:  -  Endometrioid carcinoma involving right ovary  -  No carcinoma involving left ovary  -  Endosalpingiosis   Bilateral Fallopian tubes:  -  No carcinoma identified   ONCOLOGY TABLE:   UTERUS, CARCINOMA OR CARCINOSARCOMA   Procedure: Total hysterectomy and bilateral salpingo-oophorectomy  Histologic type: Endometrioid carcinoma, NOS  Histologic Grade: FIGO grade 1  Myometrial invasion:       Depth of invasion: 3 mm       Myometrial thickness: 20 mm  Uterine Serosa Involvement: Not identified  Cervical stromal involvement: Not identified  Extent of involvement of other organs: Right ovary  Lymphovascular invasion: Not identified  Regional Lymph Nodes:       Examined:      3 Sentinel                               0 non-sentinel                               3 total        Lymph nodes with metastasis: 0        Isolated tumor cells (<0.2 mm): 0        Micrometastasis:  (>0.2 mm and < 2.0 mm): 0        Macrometastasis: (>2.0 mm): 0        Extracapsular extension: N/A  Representative Tumor Block: D10  MMR / MSI testing: Pending  Pathologic Stage Classification (pTNM, AJCC 8th edition):  pT3a, pN0  Comments: Pancytokeratin performed on the lymph nodes is negative. The right ovary has a focus of carcinoma.     09/11/2019 Procedure   Placement of single lumen port a cath via right internal jugular vein. The catheter tip lies at the cavo-atrial junction. A power injectable port a cath was placed and is ready for immediate use.     09/12/2019 - 12/26/2019 Chemotherapy   The patient had carboplatin and taxol for chemotherapy treatment.     01/19/2020 Imaging   1. Interval total hysterectomy with bilateral salpingo oophorectomy. No findings to suggest recurrent or metastatic disease. 2. Tiny hiatal hernia. 3. Left colonic diverticulosis without diverticulitis. 4. Aortic Atherosclerosis (ICD10-I70.0).   02/18/2021 Imaging   1. No evidence of metastatic  disease in the chest, abdomen or pelvis. No evidence of a left chest wall mass to correlate with reported palpable abnormality. 2. Marked sigmoid diverticulosis. 3. Small hiatal hernia. 4. Aortic Atherosclerosis (ICD10-I70.0).     11/07/2021 Imaging   1. Status post hysterectomy without evidence of local recurrence or abdominopelvic metastatic disease. 2. Sigmoid colonic diverticulosis without findings of acute diverticulitis. 3. Small hiatal hernia. 4.  Aortic Atherosclerosis (ICD10-I70.0).     02/06/2022 Procedure   Successful right IJ vein Port-A-Cath removal.      Interval History: Weight gain. Inactivity. Loves to read. With warmer weather, will be outdoors more. No change in appetite. Feels weight gain is related to dietary choices. Has trip planned to [place] for high school graduation. Mild shortness of breath with activities felt to be related to weight gain. Bilateral lower extrem edema, felt could be from weight gain as well. Denies chest pain, dyspnea. No abdominal pain or pelvic pain. No abnormal bloating. Bladder functioning without difficulty. Mild incontinence at times. Bowels functioning without difficulty. No vaginal bleeding or discharge. No longer having vulvar bumps. Has urinary freq buts drinks large amounts of water. Hx of palpitations. When laying down at night, intermittent flutter sensation.   Doing well.  Has been traveling to the beach multiple times this summer.  Denies any vaginal bleeding or discharge.  Reports intermittent constipation in the setting of travel and dietary changes.  Has not gotten back on her MiraLAX routine.  Denies any urinary symptoms.  Denies any abdominal or pelvic pain.  4 weeks ago, developed a painful little white bump on her posterior vulva.  She has been using antibacterial soap.  She describes this lesion is bothersome but not painful.  She denies any associated pruritus.  She wears a pad all the time given some urinary incontinence.  She  now has noticed some smaller white bumps around her anus.  Past Medical/Surgical History: Past Medical History:  Diagnosis Date   Anemia    Arthritis    Bilateral swelling of feet    Breast cancer (HCC)    at age 23   Constipation    History of radiation therapy    Lymph edema    right arm    Musculoskeletal pain    Other fatigue    PONV (postoperative nausea and vomiting)    Pre-diabetes    Sleep apnea    Uses CPAP   Vertigo     Past Surgical History:  Procedure Laterality Date   BACK SURGERY     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     BILATERAL TOTAL MASTECTOMY WITH AXILLARY LYMPH NODE DISSECTION Bilateral 2005   BREAST  LUMPECTOMY  1994   right   CERVICAL FUSION  1995   C5-C7   IR IMAGING GUIDED PORT INSERTION  09/11/2019   IR REMOVAL TUN ACCESS W/ PORT W/O FL MOD SED  02/06/2022   MASTECTOMY  2005   Bilat    ROBOTIC ASSISTED TOTAL HYSTERECTOMY WITH BILATERAL SALPINGO OOPHERECTOMY N/A 08/14/2019   Procedure: XI ROBOTIC ASSISTED TOTAL HYSTERECTOMY WITH BILATERAL SALPINGO OOPHORECTOMY;  Surgeon: Adolphus Birchwood, MD;  Location: [facility];  Service: Gynecology;  Laterality: N/A;   SENTINEL NODE BIOPSY N/A 08/14/2019   Procedure: SENTINEL NODE BIOPSY;  Surgeon: Adolphus Birchwood, MD;  Location: [facility];  Service: Gynecology;  Laterality: N/A;   WISDOM TOOTH EXTRACTION Bilateral 2004    Family History  Problem Relation Age of Onset   Stroke Mother    Hyperlipidemia Mother    Skin cancer Mother    Breast cancer Mother 72       ? recurrence at 78   Endometrial cancer Mother 79   Hypertension Mother    Cancer Mother    Obesity Mother    Diabetes Father    Hyperlipidemia Father    Heart attack Father 27       CABG   Colon cancer Father 19   Prostate cancer Father 31       Gleason 9   Hypertension Father    Heart disease Father    Cancer Father    Sleep apnea Father    Alcoholism Father    Obesity Father    Hyperlipidemia Sister    Breast cancer Sister  59   Heart attack Maternal Grandmother    Skin cancer Maternal Grandmother    Rheum arthritis Maternal Grandmother    Heart attack Maternal Grandfather    Diabetes Paternal Grandmother    Heart attack Paternal Grandmother    Rheum arthritis Paternal Grandmother    Heart attack Paternal Grandfather    Lymphoma Maternal Aunt 66   Kidney cancer Maternal Aunt 60   Cervical cancer Cousin 63    Social History   Socioeconomic History   Marital status: Married    Spouse name: Engineer, drilling   Number of children: 0   Years of education: Not on file   Highest education level: Not on file  Occupational History   Occupation: Retired Civil engineer, contracting: [employer]    Comment: [employer]  Tobacco Use   Smoking status: Former    Types: Cigarettes    Quit date: 10/31/2006    Years since quitting: 15.6   Smokeless tobacco: Never  Vaping Use   Vaping Use: Never used  Substance and Sexual Activity   Alcohol use: Yes   Drug use: No   Sexual activity: Not Currently    Partners: Female    Birth control/protection: None  Other Topics Concern   Not on file  Social History Narrative   Lives with her partner, Ellery Plunk. Former Psychologist, forensic.    Social Determinants of Health   Financial Resource Strain: Not on file  Food Insecurity: Not on file  Transportation Needs: Not on file  Physical Activity: Not on file  Stress: Not on file  Social Connections: Not on file    Current Medications:  Current Outpatient Medications:    AMBULATORY NON FORMULARY MEDICATION, Medication Name: CPAP with humidifier with 11 cm water pressure.  Dx OSA.  See attached Sleep Study.  Please fax to [facility]., Disp: 1 Units, Rfl: 0   atorvastatin (LIPITOR) 20 MG  tablet, Take 1 tablet (20 mg total) by mouth at bedtime., Disp: 90 tablet, Rfl: 3   CALCIUM PO, Take 1 tablet by mouth daily., Disp: , Rfl:    celecoxib (CELEBREX) 200 MG capsule, ONE TO 2 TABLETS BY MOUTH DAILY AS NEEDED FOR PAIN.,  Disp: 60 capsule, Rfl: 2   DULoxetine (CYMBALTA) 60 MG capsule, TAKE 1 CAPSULE BY MOUTH DAILY, Disp: 90 capsule, Rfl: 3   gabapentin (NEURONTIN) 300 MG capsule, TAKE 2 CAPSULES BY MOUTH  DAILY, Disp: 180 capsule, Rfl: 3   Krill Oil 500 MG CAPS, Take 1 capsule by mouth daily., Disp: , Rfl:    Multiple Vitamins-Minerals (CENTRUM SILVER 50+WOMEN) TABS, Take 1 capsule by mouth daily., Disp: , Rfl:    Multiple Vitamins-Minerals (PRESERVISION AREDS) CAPS, Take 1 capsule by mouth daily., Disp: , Rfl:    rOPINIRole (REQUIP) 0.5 MG tablet, TAKE 1 TABLET BY MOUTH AT  BEDTIME, Disp: 100 tablet, Rfl: 3   vitamin C (ASCORBIC ACID) 500 MG tablet, Take 500 mg by mouth daily., Disp: , Rfl:   Review of Systems: + constipation Denies appetite changes, fevers, chills, fatigue, unexplained weight changes. Denies hearing loss, neck lumps or masses, mouth sores, ringing in ears or voice changes. Denies cough or wheezing.  Denies shortness of breath. Denies chest pain or palpitations. Denies leg swelling. Denies abdominal distention, pain, blood in stools, diarrhea, nausea, vomiting, or early satiety. Denies pain with intercourse, dysuria, frequency, hematuria or incontinence. Denies hot flashes, pelvic pain, vaginal bleeding or vaginal discharge.   Denies joint pain, back pain or muscle pain/cramps. Denies itching, rash, or wounds. Denies dizziness, headaches, numbness or seizures. Denies swollen lymph nodes or glands, denies easy bruising or bleeding. Denies anxiety, depression, confusion, or decreased concentration.  Physical Exam: BP 134/69 (BP Location: Left Arm, Patient Position: Sitting)   Pulse (!) 59   Temp 98.8 F (37.1 C)   Ht 5' 4.96" (1.65 m)   Wt 253 lb 9.6 oz (115 kg)   SpO2 100%   BMI 42.25 kg/m  General: Alert, oriented, no acute distress. HEENT: Normocephalic, atraumatic, sclera anicteric. Chest: Clear to auscultation bilaterally.  No wheezes or rhonchi. Cardiovascular: Regular rate  and rhythm, no murmurs. Abdomen: Obese, soft, nontender.  Normoactive bowel sounds.  No masses or hepatosplenomegaly appreciated.  Well-healed incisions. Extremities: Grossly normal range of motion.  Warm, well perfused.  No edema bilaterally. Skin: No rashes or lesions noted. Lymphatics: No cervical, supraclavicular, or inguinal adenopathy. GU: Normal appearing external genitalia without erythema, excoriation. Speculum exam reveals cuff intact, mild atrophy.  Bimanual exam reveals no nodularity or masses.  Rectovaginal exam confirms findings.  Laboratory & Radiologic Studies: None new  Assessment & Plan: Patricia Waters is a 67 y.o. 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   The patient is overall doing quite well and is NED on exam today.  We will continue to alternate visits between Dr. Bertis Ruddy and myself.  She has a CT scan scheduled for later this afternoon.  She sees Dr. Bertis Ruddy in November.  I will plan to see her back in March.  Discussed lesion on her vulva is likely a sebaceous cyst versus comedone.  Treated today.   We discussed that in the setting of her cancer, no routine surveillance imaging is recommended.  We will get imaging if she develops symptoms.  Additionally, given her history, no routine Pap tests are indicated.  26 minutes of total time was spent for this patient encounter, including preparation, face-to-face counseling with the patient and coordination of care, and documentation of the encounter.  Eugene Garnet, MD  Division of Gynecologic Oncology  Department of Obstetrics and Gynecology  [facility]

## 2022-06-12 ENCOUNTER — Ambulatory Visit: Payer: Medicare Other | Admitting: Family Medicine

## 2022-06-14 ENCOUNTER — Ambulatory Visit (INDEPENDENT_AMBULATORY_CARE_PROVIDER_SITE_OTHER): Payer: Medicare Other | Admitting: Family Medicine

## 2022-06-14 ENCOUNTER — Encounter: Payer: Self-pay | Admitting: Family Medicine

## 2022-06-14 VITALS — BP 138/65 | HR 70 | Ht 64.96 in | Wt 254.0 lb

## 2022-06-14 DIAGNOSIS — Z780 Asymptomatic menopausal state: Secondary | ICD-10-CM | POA: Diagnosis not present

## 2022-06-14 DIAGNOSIS — Z636 Dependent relative needing care at home: Secondary | ICD-10-CM | POA: Diagnosis not present

## 2022-06-14 DIAGNOSIS — R7301 Impaired fasting glucose: Secondary | ICD-10-CM | POA: Diagnosis not present

## 2022-06-14 DIAGNOSIS — I7121 Aneurysm of the ascending aorta, without rupture: Secondary | ICD-10-CM | POA: Diagnosis not present

## 2022-06-14 DIAGNOSIS — M19072 Primary osteoarthritis, left ankle and foot: Secondary | ICD-10-CM | POA: Diagnosis not present

## 2022-06-14 LAB — POCT GLYCOSYLATED HEMOGLOBIN (HGB A1C): Hemoglobin A1C: 5.4 % (ref 4.0–5.6)

## 2022-06-14 MED ORDER — CELECOXIB 200 MG PO CAPS: ORAL_CAPSULE | ORAL | 1 refills | Status: DC

## 2022-06-14 NOTE — Assessment & Plan Note (Signed)
Discussed possible referral for therapy/counseling.  She is open to that so we will go ahead and place referral today.  She has good support through her church as well

## 2022-06-14 NOTE — Progress Notes (Signed)
   Established Patient Office Visit  Subjective   Patient ID: Patricia Waters, female    DOB: Jun 06, 1955  Age: 67 y.o. MRN: [id number]  Chief Complaint  Patient presents with   ifg    HPI  Impaired fasting glucose-no increased thirst or urination. No symptoms consistent with hypoglycemia.  She is struggling with her weight again. Has been less active over the winter and some stress eating.    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  Hre mother lives with her as well.    Also dealing with Left ankle pain. The celebrex has been helping. Surgeon felt like holding off on surgery. Would like celebrex sent to mail order.      ROS    Objective:     BP 138/65   Pulse 70   Ht 5' 4.96" (1.65 m)   Wt 254 lb (115.2 kg)   SpO2 96%   BMI 42.32 kg/m    Physical Exam Vitals and nursing note reviewed.  Constitutional:      Appearance: She is well-developed.  HENT:     Head: Normocephalic and atraumatic.  Cardiovascular:     Rate and Rhythm: Normal rate and regular rhythm.     Heart sounds: Normal heart sounds.  Pulmonary:     Effort: Pulmonary effort is normal.     Breath sounds: Normal breath sounds.  Skin:    General: Skin is warm and dry.  Neurological:     Mental Status: She is alert and oriented to person, place, and time.  Psychiatric:        Behavior: Behavior normal.      Results for orders placed or performed in visit on 06/14/22  POCT glycosylated hemoglobin (Hb A1C)  Result Value Ref Range   Hemoglobin A1C 5.4 4.0 - 5.6 %   HbA1c POC (<> result, manual entry)     HbA1c, POC (prediabetic range)     HbA1c, POC (controlled diabetic range)        The 10-year ASCVD risk score (Arnett DK, et al., 2019) is: 5%    Assessment & Plan:   Problem List Items Addressed This Visit       Cardiovascular and Mediastinum   Aortic aneurysm [facility])    Due for f/u CTA or MRA in November.         Endocrine   IFG (impaired fasting glucose) - Primary     Stable. F/U in 6 mo      Relevant Orders   POCT glycosylated hemoglobin (Hb A1C) (Completed)     Musculoskeletal and Integument   Primary osteoarthritis of left ankle    RF celebrex      Relevant Medications   celecoxib (CELEBREX) 200 MG capsule     Other   Caregiver stress    Discussed possible referral for therapy/counseling.  She is open to that so we will go ahead and place referral today.  She has good support through her church as well      Relevant Orders   Ambulatory referral to Behavioral Health   Other Visit Diagnoses     Post-menopausal       Relevant Orders   DG Bone Density       Return in about 6 months (around 12/15/2022) for Pre-diabetes.    Nani Gasser, MD

## 2022-06-14 NOTE — Assessment & Plan Note (Signed)
Stable. F/U in 6 mo

## 2022-06-14 NOTE — Assessment & Plan Note (Signed)
RF celebrex

## 2022-06-14 NOTE — Assessment & Plan Note (Signed)
Due for f/u CTA or MRA in November.

## 2022-06-19 ENCOUNTER — Ambulatory Visit: Payer: Medicare Other | Admitting: Family Medicine

## 2022-06-20 IMAGING — CT CT ABD-PELV W/ CM
2 of 5 series · 16 of 46 positions shown, 18 images · IV contrast (APPLIED)
Comparison: None.

CLINICAL DATA: Newly diagnosed endometrial carcinoma. Staging.
Personal history of breast carcinoma.

EXAM:
CT ABDOMEN AND PELVIS WITH CONTRAST
TECHNIQUE: Multidetector CT imaging of the abdomen and pelvis was performed
using the standard protocol following bolus administration of
intravenous contrast.
CONTRAST:  100mL OMNIPAQUE IOHEXOL 300 MG/ML  SOLN

[Series 2: axial st · axial · 0.76mm/px · z∈[-429,-19]mm · 13 of 94 slices shown, 15 images]
[im 6/94  soft-tissue]
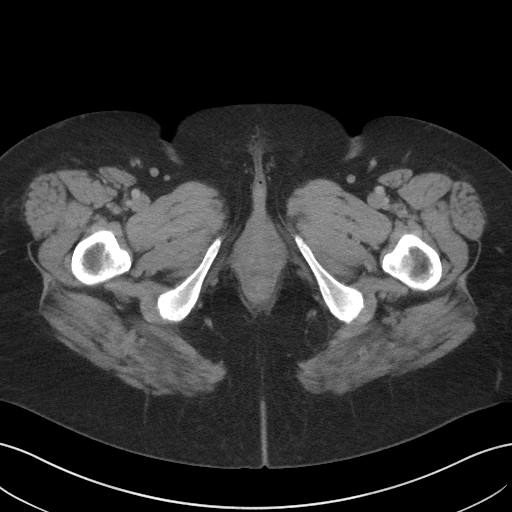
[im 6/94  bone]
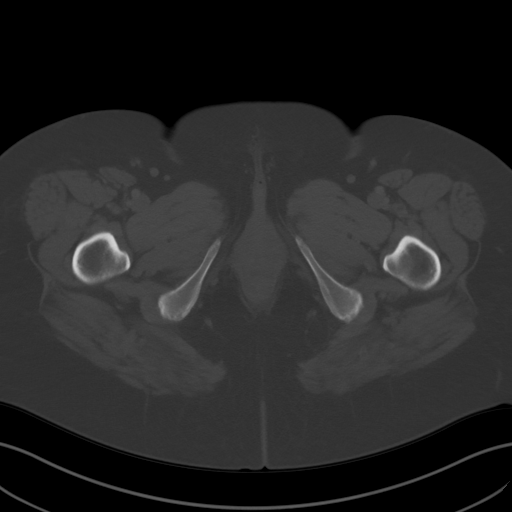
[im 11/94  soft-tissue]
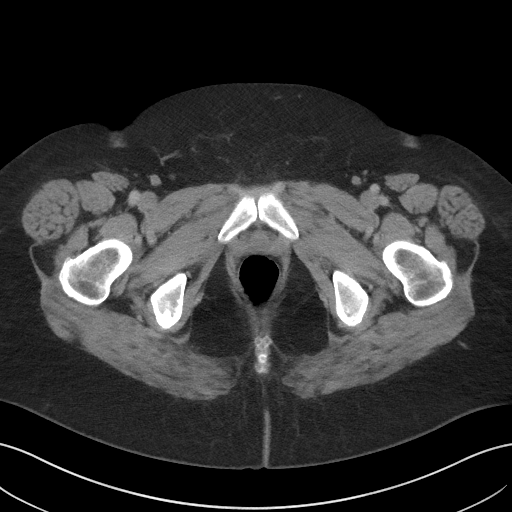
[im 21/94  soft-tissue]
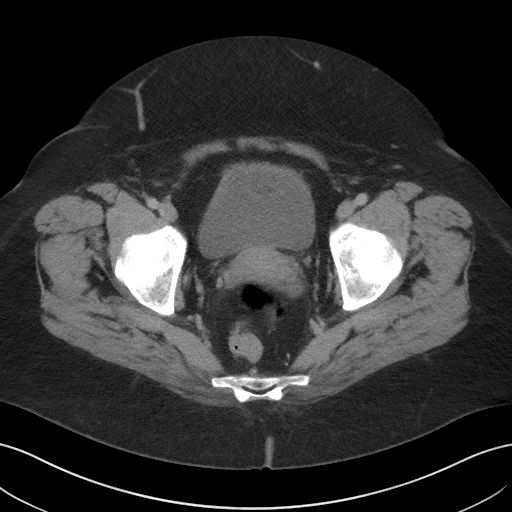
[im 26/94  soft-tissue]
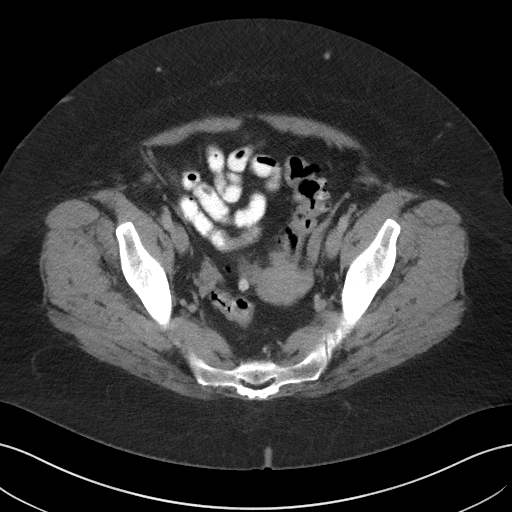
[im 32/94  soft-tissue]
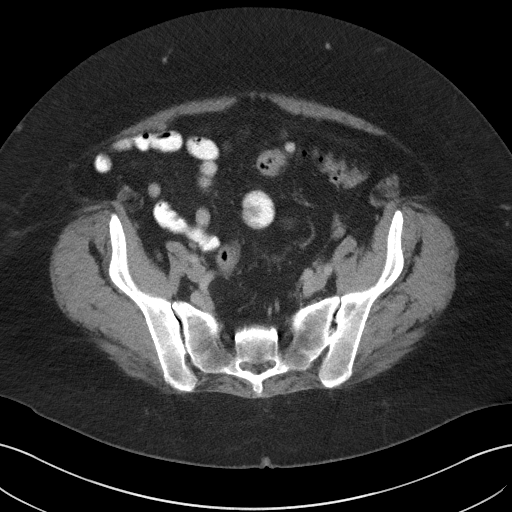
[im 42/94  soft-tissue]
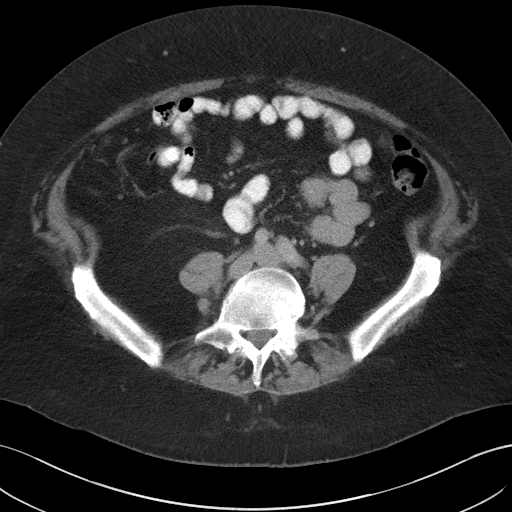
[im 47/94  soft-tissue]
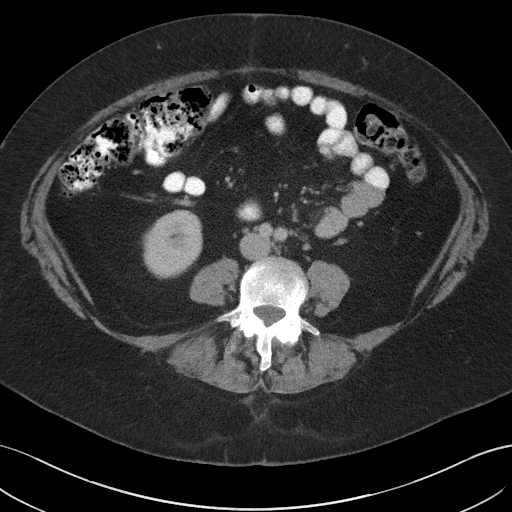
[im 52/94  soft-tissue]
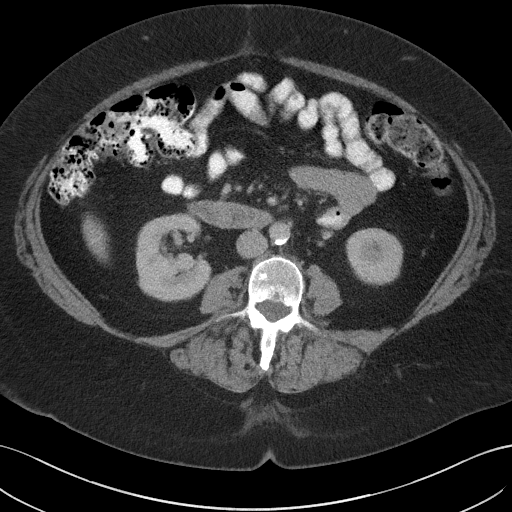
[im 63/94  soft-tissue]
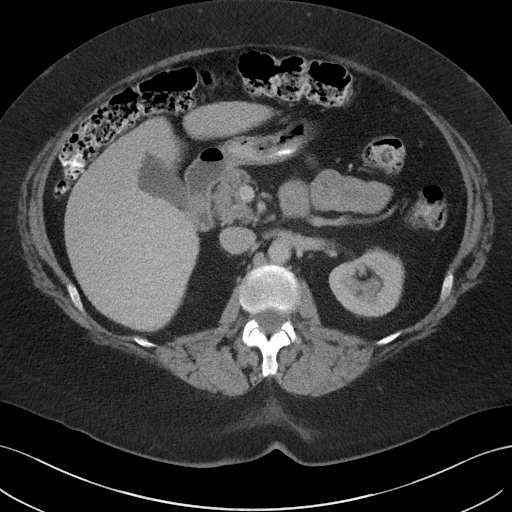
[im 63/94  bone]
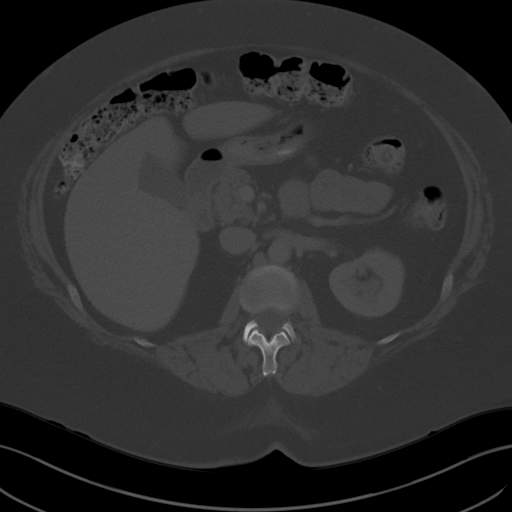
[im 68/94  soft-tissue]
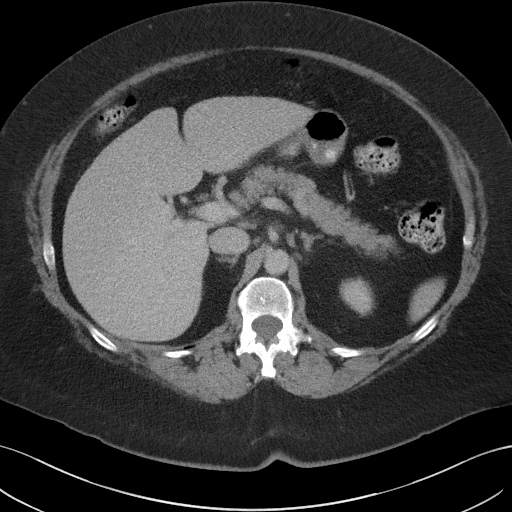
[im 73/94  soft-tissue]
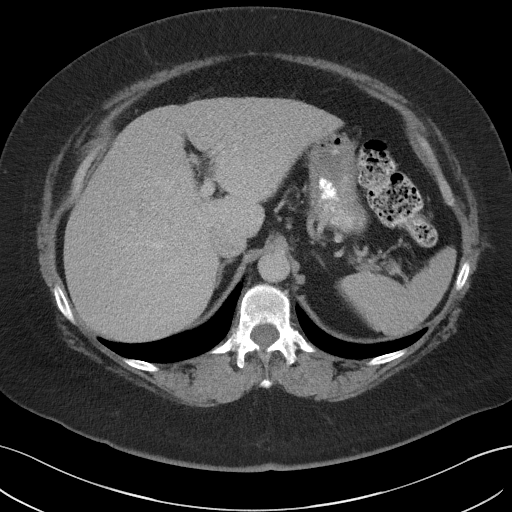
[im 83/94  soft-tissue]
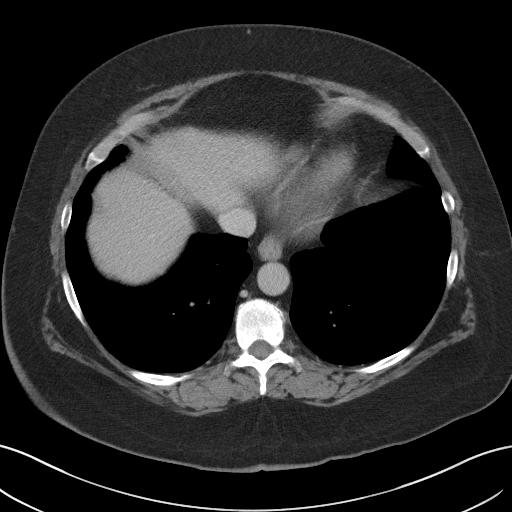
[im 88/94  soft-tissue]
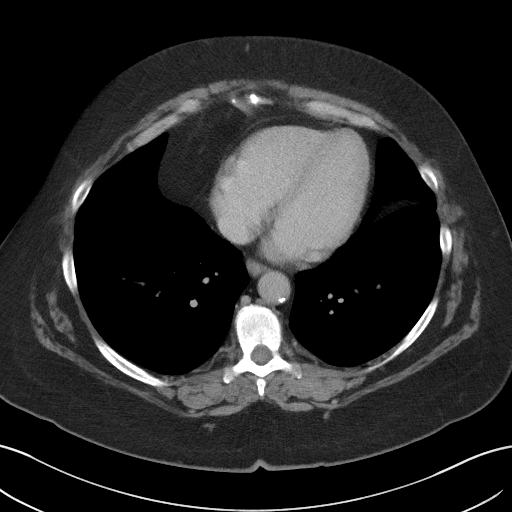

[Series 5: coronal st · coronal · 0.90mm/px · 3 of 109 slices shown]
[im 37/109  soft-tissue]
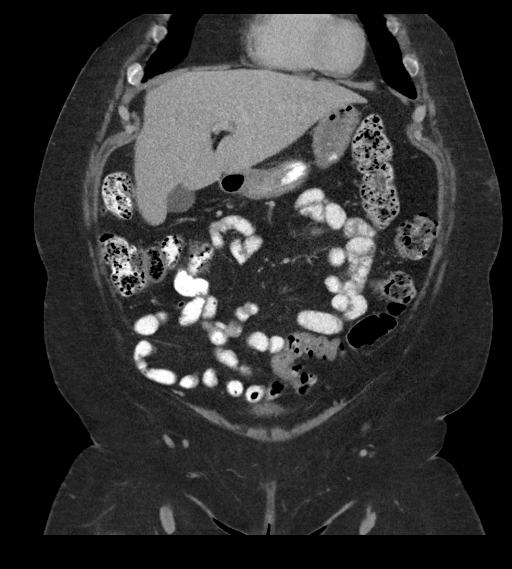
[im 49/109  soft-tissue]
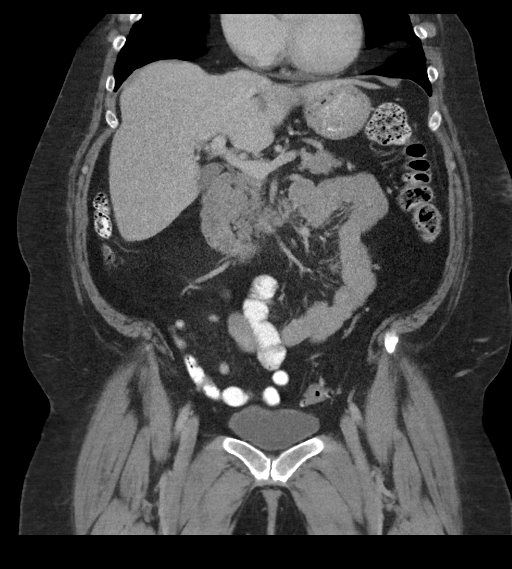
[im 61/109  soft-tissue]
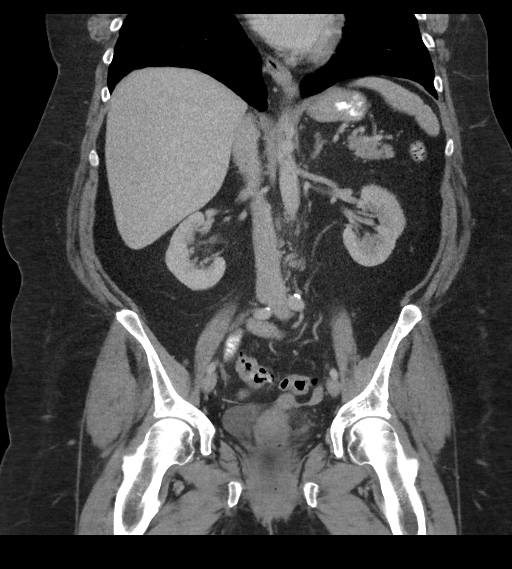

[16 of 46 positions shown; findings below may reference images not displayed]

FINDINGS: Lower Chest: No acute findings.

Hepatobiliary: No hepatic masses identified. Gallbladder is
unremarkable. No evidence of biliary ductal dilatation.

Pancreas:  No mass or inflammatory changes.

Spleen: Within normal limits in size and appearance.

Adrenals/Urinary Tract: No masses identified. No evidence of
ureteral calculi or hydronephrosis.

Stomach/Bowel: No evidence of obstruction, inflammatory process or
abnormal fluid collections. Diverticulosis is seen mainly involving
the sigmoid colon, however there is no evidence of diverticulitis.
Normal appendix visualized.

Vascular/Lymphatic: No pathologically enlarged lymph nodes. No
abdominal aortic aneurysm. Aortic atherosclerosis noted.

Reproductive: A small fibroid is seen in the anterior uterine corpus
which measures 1.7 cm. Uterus is otherwise unremarkable. No adnexal
masses identified. No evidence of free fluid.

Other:  None.

Musculoskeletal:  No suspicious bone lesions identified.
IMPRESSION: No evidence of abdominal or pelvic metastatic disease.

Small uterine fibroid.

Colonic diverticulosis, without radiographic evidence of
diverticulitis.

Aortic Atherosclerosis ([id number]-DF8.8).

## 2022-07-12 DIAGNOSIS — Z129 Encounter for screening for malignant neoplasm, site unspecified: Secondary | ICD-10-CM | POA: Diagnosis not present

## 2022-07-12 DIAGNOSIS — D225 Melanocytic nevi of trunk: Secondary | ICD-10-CM | POA: Diagnosis not present

## 2022-07-12 DIAGNOSIS — L820 Inflamed seborrheic keratosis: Secondary | ICD-10-CM | POA: Diagnosis not present

## 2022-07-12 DIAGNOSIS — L821 Other seborrheic keratosis: Secondary | ICD-10-CM | POA: Diagnosis not present

## 2022-07-12 DIAGNOSIS — Z85828 Personal history of other malignant neoplasm of skin: Secondary | ICD-10-CM | POA: Diagnosis not present

## 2022-07-19 ENCOUNTER — Ambulatory Visit (INDEPENDENT_AMBULATORY_CARE_PROVIDER_SITE_OTHER): Payer: Medicare Other

## 2022-07-19 DIAGNOSIS — Z780 Asymptomatic menopausal state: Secondary | ICD-10-CM | POA: Diagnosis not present

## 2022-07-26 ENCOUNTER — Telehealth: Payer: Self-pay | Admitting: Hematology and Oncology

## 2022-07-26 NOTE — Telephone Encounter (Signed)
Patient need appointment reschedule appointment, left a message regarding rescheduled appointment dates/times.

## 2022-08-17 ENCOUNTER — Other Ambulatory Visit: Payer: Self-pay | Admitting: Family Medicine

## 2022-08-23 DIAGNOSIS — G4733 Obstructive sleep apnea (adult) (pediatric): Secondary | ICD-10-CM | POA: Diagnosis not present

## 2022-08-30 ENCOUNTER — Other Ambulatory Visit: Payer: Self-pay | Admitting: Family Medicine

## 2022-08-30 DIAGNOSIS — M19072 Primary osteoarthritis, left ankle and foot: Secondary | ICD-10-CM

## 2022-09-28 ENCOUNTER — Other Ambulatory Visit: Payer: Medicare Other

## 2022-09-28 ENCOUNTER — Ambulatory Visit: Payer: Medicare Other | Admitting: Hematology and Oncology

## 2022-10-08 ENCOUNTER — Other Ambulatory Visit: Payer: Self-pay | Admitting: Family Medicine

## 2022-10-08 DIAGNOSIS — Z0000 Encounter for general adult medical examination without abnormal findings: Secondary | ICD-10-CM

## 2022-10-09 ENCOUNTER — Other Ambulatory Visit: Payer: Self-pay | Admitting: Oncology

## 2022-10-09 DIAGNOSIS — Z006 Encounter for examination for normal comparison and control in clinical research program: Secondary | ICD-10-CM

## 2022-10-12 ENCOUNTER — Inpatient Hospital Stay: Payer: Medicare Other | Attending: Hematology and Oncology

## 2022-10-12 ENCOUNTER — Encounter: Payer: Self-pay | Admitting: Hematology and Oncology

## 2022-10-12 ENCOUNTER — Inpatient Hospital Stay: Payer: Medicare Other | Admitting: Hematology and Oncology

## 2022-10-12 VITALS — BP 127/67 | HR 69 | Temp 98.4°F | Resp 18 | Ht 64.95 in | Wt 264.8 lb

## 2022-10-12 DIAGNOSIS — Z79899 Other long term (current) drug therapy: Secondary | ICD-10-CM | POA: Diagnosis not present

## 2022-10-12 DIAGNOSIS — K5909 Other constipation: Secondary | ICD-10-CM

## 2022-10-12 DIAGNOSIS — Z6841 Body Mass Index (BMI) 40.0 and over, adult: Secondary | ICD-10-CM | POA: Diagnosis not present

## 2022-10-12 DIAGNOSIS — Z91199 Patient's noncompliance with other medical treatment and regimen due to unspecified reason: Secondary | ICD-10-CM | POA: Insufficient documentation

## 2022-10-12 DIAGNOSIS — C541 Malignant neoplasm of endometrium: Secondary | ICD-10-CM | POA: Diagnosis not present

## 2022-10-12 LAB — CBC WITH DIFFERENTIAL (CANCER CENTER ONLY)
Abs Immature Granulocytes: 0.02 10*3/uL (ref 0.00–0.07)
Basophils Absolute: 0.1 10*3/uL (ref 0.0–0.1)
Basophils Relative: 1 %
Eosinophils Absolute: 0.1 10*3/uL (ref 0.0–0.5)
Eosinophils Relative: 1 %
HCT: 38.5 % (ref 36.0–46.0)
Hemoglobin: 12.8 g/dL (ref 12.0–15.0)
Immature Granulocytes: 0 %
Lymphocytes Relative: 26 %
Lymphs Abs: 2 10*3/uL (ref 0.7–4.0)
MCH: 32.7 pg (ref 26.0–34.0)
MCHC: 33.2 g/dL (ref 30.0–36.0)
MCV: 98.5 fL (ref 80.0–100.0)
Monocytes Absolute: 0.6 10*3/uL (ref 0.1–1.0)
Monocytes Relative: 8 %
Neutro Abs: 4.8 10*3/uL (ref 1.7–7.7)
Neutrophils Relative %: 64 %
Platelet Count: 255 10*3/uL (ref 150–400)
RBC: 3.91 MIL/uL (ref 3.87–5.11)
RDW: 12.7 % (ref 11.5–15.5)
WBC Count: 7.5 10*3/uL (ref 4.0–10.5)
nRBC: 0 % (ref 0.0–0.2)

## 2022-10-12 LAB — CMP (CANCER CENTER ONLY)
ALT: 22 U/L (ref 0–44)
AST: 27 U/L (ref 15–41)
Albumin: 4.2 g/dL (ref 3.5–5.0)
Alkaline Phosphatase: 116 U/L (ref 38–126)
Anion gap: 5 (ref 5–15)
BUN: 11 mg/dL (ref 8–23)
CO2: 30 mmol/L (ref 22–32)
Calcium: 9.7 mg/dL (ref 8.9–10.3)
Chloride: 104 mmol/L (ref 98–111)
Creatinine: 0.79 mg/dL (ref 0.44–1.00)
GFR, Estimated: >60 mL/min (ref >60)
Glucose, Bld: 105 mg/dL — ABNORMAL HIGH (ref 70–99)
Potassium: 4.6 mmol/L (ref 3.5–5.1)
Sodium: 139 mmol/L (ref 135–145)
Total Bilirubin: 0.5 mg/dL (ref 0.3–1.2)
Total Protein: 7 g/dL (ref 6.5–8.1)

## 2022-10-12 NOTE — Assessment & Plan Note (Signed)
She has new onset of constipation and appears bloated I recommend CT imaging to rule out cancer recurrence I recommend the patient to start taking MiraLAX

## 2022-10-12 NOTE — Assessment & Plan Note (Signed)
She has been noncompliant with dietary modification and have minimum exercise She has gained a lot of weight We discussed risk of cancer recurrence in association with obesity I recommend the patient to join a local gym

## 2022-10-12 NOTE — Progress Notes (Signed)
[facility] OFFICE PROGRESS NOTE  Patient Care Team: Agapito Games, MD as PCP - General (Family Medicine) Rollene Rotunda, MD as PCP - Cardiology (Cardiology) Cherlyn Roberts, MD (Dermatology) Barnett Abu, MD (Neurosurgery) Lesly Dukes, MD (Obstetrics and Gynecology) Carver Fila, MD as Consulting Physician (Gynecologic Oncology)  ASSESSMENT & PLAN:  Endometrial cancer [facility]) She is noted to have significant weight gain She has new onset of constipation and felt bloated Her examination is technically difficult due to significant central obesity I recommend CT imaging to assess and she agreed  Obesity, Class III, BMI 40-49.9 (morbid obesity) (HCC) She has been noncompliant with dietary modification and have minimum exercise She has gained a lot of weight We discussed risk of cancer recurrence in association with obesity I recommend the patient to join a local gym  Other constipation She has new onset of constipation and appears bloated I recommend CT imaging to rule out cancer recurrence I recommend the patient to start taking MiraLAX  Orders Placed This Encounter  Procedures   CT ABDOMEN PELVIS W CONTRAST    Standing Status:   Future    Standing Expiration Date:   10/12/2023    Order Specific Question:   If indicated for the ordered procedure, I authorize the administration of contrast media per Radiology protocol    Answer:   Yes    Order Specific Question:   Does the patient have a contrast media/X-ray dye allergy?    Answer:   No    Order Specific Question:   Preferred imaging location?    Answer:   [facility]    Order Specific Question:   If indicated for the ordered procedure, I authorize the administration of oral contrast media per Radiology protocol    Answer:   Yes   CBC with Differential (Cancer Center Only)    Standing Status:   Future    Standing Expiration Date:   10/12/2023   CMP (Cancer Center only)    Standing  Status:   Future    Standing Expiration Date:   10/12/2023   CA 125    Standing Status:   Standing    Number of Occurrences:   11    Standing Expiration Date:   10/12/2023    All questions were answered. The patient knows to call the clinic with any problems, questions or concerns. The total time spent in the appointment was 40 minutes encounter with patients including review of chart and various tests results, discussions about plan of care and coordination of care plan   Artis Delay, MD 10/12/2022 12:38 PM  INTERVAL HISTORY: Please see below for problem oriented charting. she returns for surveillance follow-up She is here accompanied by her spouse I noted tremendous weight gain since May She is leading a sedentary lifestyle She has noted new onset of bloating as well as new constipation Denies abnormal vaginal bleeding  REVIEW OF SYSTEMS:   Constitutional: Denies fevers, chills or abnormal weight loss Eyes: Denies blurriness of vision Ears, nose, mouth, throat, and face: Denies mucositis or sore throat Respiratory: Denies cough, dyspnea or wheezes Cardiovascular: Denies palpitation, chest discomfort or lower extremity swelling Skin: Denies abnormal skin rashes Lymphatics: Denies new lymphadenopathy or easy bruising Neurological:Denies numbness, tingling or new weaknesses Behavioral/Psych: Mood is stable, no new changes  All other systems were reviewed with the patient and are negative.  I have reviewed the past medical history, past surgical history, social history and family history with the patient and  they are unchanged from previous note.  ALLERGIES:  has No Known Allergies.  MEDICATIONS:  Current Outpatient Medications  Medication Sig Dispense Refill   AMBULATORY NON FORMULARY MEDICATION Medication Name: CPAP with humidifier with 11 cm water pressure.  Dx OSA.  See attached Sleep Study.  Please fax to [facility]. 1 Units 0   atorvastatin (LIPITOR) 20 MG tablet TAKE 1  TABLET BY MOUTH AT  BEDTIME 100 tablet 2   CALCIUM PO Take 1 tablet by mouth daily.     celecoxib (CELEBREX) 200 MG capsule TAKE 1 TO 2 CAPSULES BY MOUTH  DAILY AS NEEDED FOR PAIN 200 capsule 2   DULoxetine (CYMBALTA) 60 MG capsule TAKE 1 CAPSULE BY MOUTH DAILY 90 capsule 3   gabapentin (NEURONTIN) 300 MG capsule TAKE 2 CAPSULES BY MOUTH DAILY 200 capsule 2   Krill Oil 500 MG CAPS Take 1 capsule by mouth daily.     Multiple Vitamins-Minerals (CENTRUM SILVER 50+WOMEN) TABS Take 1 capsule by mouth daily.     Multiple Vitamins-Minerals (PRESERVISION AREDS) CAPS Take 1 capsule by mouth daily.     rOPINIRole (REQUIP) 0.5 MG tablet TAKE 1 TABLET BY MOUTH AT  BEDTIME 100 tablet 2   vitamin C (ASCORBIC ACID) 500 MG tablet Take 500 mg by mouth daily.     No current facility-administered medications for this visit.    SUMMARY OF ONCOLOGIC HISTORY: Oncology History Overview Note  MSI Stable Negative genetics   Endometrial cancer (HCC)  07/28/2019 Pathology Results   Endometrium, biopsy - ENDOMETRIOID CARCINOMA - SEE COMMENT Microscopic Comment Based on the biopsy, the carcinoma appears FIGO grade 1.   08/01/2019 Imaging   CT abdomen and pelvis No evidence of abdominal or pelvic metastatic disease.   Small uterine fibroid.   Colonic diverticulosis, without radiographic evidence of diverticulitis.   Aortic Atherosclerosis (ICD10-I70.0).   08/14/2019 Surgery   Surgeon: Quinn Axe  Operation: Robotic-assisted laparoscopic total hysterectomy with bilateral salpingoophorectomy, SLN biopsy     Operative Findings:  : 8cm uterus which appeared grossly normal, normal appearing tubes and ovaries, no suspicious lymph nodes. Brisk oozing from skin incisions.    09/03/2019 Cancer Staging   Staging form: Corpus Uteri - Carcinoma and Carcinosarcoma, AJCC 8th Edition - Pathologic stage from 09/03/2019: FIGO Stage IIIA (pT3a, pN0, cM0) - Signed by Artis Delay, MD on 09/03/2019   09/04/2019 Pathology  Results   FINAL MICROSCOPIC DIAGNOSIS:   A. LYMPH NODE, SENTINEL RIGHT OBTURATOR PROXIMAL, BIOPSY:  -  No carcinoma identified in one lymph node (0/1)  -  See comment   B. LYMPH NODE, SENTINEL RIGHT OBTURATOR DISTAL, BIOPSY:  -  No carcinoma identified in one lymph node (0/1)  -  See comment   C. LYMPH NODE, SENTINEL LEFT EXTERNAL ILIAC, BIOPSY:  -  No carcinoma identified in one lymph node (0/1)  -  See comment   D. UTERUS AND BILATERAL ADNEXA, HYSTERECTOMY WITH SALPINGO-OOPHORECTOMY:   Uterus:  -  Endometrioid carcinoma, FIGO grade 1  -  Leiomyoma (1.8 cm; largest)  -  See oncology table and comment below   Cervix:  -  Cervix with squamous metaplasia  -  No carcinoma identified   Bilateral Ovaries:  -  Endometrioid carcinoma involving right ovary  -  No carcinoma involving left ovary  -  Endosalpingiosis   Bilateral Fallopian tubes:  -  No carcinoma identified   ONCOLOGY TABLE:   UTERUS, CARCINOMA OR CARCINOSARCOMA   Procedure: Total hysterectomy and bilateral salpingo-oophorectomy  Histologic type: Endometrioid carcinoma, NOS  Histologic Grade: FIGO grade 1  Myometrial invasion:       Depth of invasion: 3 mm       Myometrial thickness: 20 mm  Uterine Serosa Involvement: Not identified  Cervical stromal involvement: Not identified  Extent of involvement of other organs: Right ovary  Lymphovascular invasion: Not identified  Regional Lymph Nodes:       Examined:      3 Sentinel                               0 non-sentinel                               3 total        Lymph nodes with metastasis: 0        Isolated tumor cells (<0.2 mm): 0        Micrometastasis:  (>0.2 mm and < 2.0 mm): 0        Macrometastasis: (>2.0 mm): 0        Extracapsular extension: N/A  Representative Tumor Block: D10  MMR / MSI testing: Pending  Pathologic Stage Classification (pTNM, AJCC 8th edition):  pT3a, pN0  Comments: Pancytokeratin performed on the lymph nodes is negative. The  right ovary has a focus of carcinoma.     09/11/2019 Procedure   Placement of single lumen port a cath via right internal jugular vein. The catheter tip lies at the cavo-atrial junction. A power injectable port a cath was placed and is ready for immediate use.     09/12/2019 - 12/26/2019 Chemotherapy   The patient had carboplatin and taxol for chemotherapy treatment.     01/19/2020 Imaging   1. Interval total hysterectomy with bilateral salpingo oophorectomy. No findings to suggest recurrent or metastatic disease. 2. Tiny hiatal hernia. 3. Left colonic diverticulosis without diverticulitis. 4. Aortic Atherosclerosis (ICD10-I70.0).   02/18/2021 Imaging   1. No evidence of metastatic disease in the chest, abdomen or pelvis. No evidence of a left chest wall mass to correlate with reported palpable abnormality. 2. Marked sigmoid diverticulosis. 3. Small hiatal hernia. 4. Aortic Atherosclerosis (ICD10-I70.0).     11/07/2021 Imaging   1. Status post hysterectomy without evidence of local recurrence or abdominopelvic metastatic disease. 2. Sigmoid colonic diverticulosis without findings of acute diverticulitis. 3. Small hiatal hernia. 4.  Aortic Atherosclerosis (ICD10-I70.0).     02/06/2022 Procedure   Successful right IJ vein Port-A-Cath removal.      PHYSICAL EXAMINATION: ECOG PERFORMANCE STATUS: 1 - Symptomatic but completely ambulatory  Vitals:   10/12/22 1056  BP: 127/67  Pulse: 69  Resp: 18  Temp: 98.4 F (36.9 C)  SpO2: 99%   Filed Weights   10/12/22 1056  Weight: 264 lb 12.8 oz (120.1 kg)    GENERAL:alert, no distress and comfortable SKIN: skin color, texture, turgor are normal, no rashes or significant lesions EYES: normal, Conjunctiva are pink and non-injected, sclera clear OROPHARYNX:no exudate, no erythema and lips, buccal mucosa, and tongue normal  NECK: supple, thyroid normal size, non-tender, without nodularity LYMPH:  no palpable lymphadenopathy in the  cervical, axillary or inguinal LUNGS: clear to auscultation and percussion with normal breathing effort HEART: regular rate & rhythm and no murmurs and no lower extremity edema ABDOMEN:abdomen soft, non-tender and normal bowel sounds Musculoskeletal:no cyanosis of digits and no clubbing  NEURO: alert &  oriented x 3 with fluent speech, no focal motor/sensory deficits  LABORATORY DATA:  I have reviewed the data as listed    Component Value Date/Time   NA 139 10/12/2022 1031   NA 141 11/17/2020 1048   NA 141 01/20/2014 1604   K 4.6 10/12/2022 1031   K 4.2 01/20/2014 1604   CL 104 10/12/2022 1031   CO2 30 10/12/2022 1031   CO2 28 01/20/2014 1604   GLUCOSE 105 (H) 10/12/2022 1031   GLUCOSE 99 01/20/2014 1604   BUN 11 10/12/2022 1031   BUN 15 11/17/2020 1048   BUN 14.6 01/20/2014 1604   CREATININE 0.79 10/12/2022 1031   CREATININE 0.78 03/24/2019 1041   CREATININE 0.8 01/20/2014 1604   CALCIUM 9.7 10/12/2022 1031   CALCIUM 9.8 01/20/2014 1604   PROT 7.0 10/12/2022 1031   PROT 7.2 11/17/2020 1048   PROT 7.3 01/20/2014 1604   ALBUMIN 4.2 10/12/2022 1031   ALBUMIN 4.6 11/17/2020 1048   ALBUMIN 4.0 01/20/2014 1604   AST 27 10/12/2022 1031   AST 22 01/20/2014 1604   ALT 22 10/12/2022 1031   ALT 20 01/20/2014 1604   ALKPHOS 116 10/12/2022 1031   ALKPHOS 134 01/20/2014 1604   BILITOT 0.5 10/12/2022 1031   BILITOT <0.20 01/20/2014 1604   GFRNONAA >60 10/12/2022 1031   GFRNONAA 81 03/24/2019 1041   GFRAA >60 10/24/2019 0737   GFRAA 94 03/24/2019 1041    No results found for: "SPEP", "UPEP"  Lab Results  Component Value Date   WBC 7.5 10/12/2022   NEUTROABS 4.8 10/12/2022   HGB 12.8 10/12/2022   HCT 38.5 10/12/2022   MCV 98.5 10/12/2022   PLT 255 10/12/2022      Chemistry      Component Value Date/Time   NA 139 10/12/2022 1031   NA 141 11/17/2020 1048   NA 141 01/20/2014 1604   K 4.6 10/12/2022 1031   K 4.2 01/20/2014 1604   CL 104 10/12/2022 1031   CO2 30  10/12/2022 1031   CO2 28 01/20/2014 1604   BUN 11 10/12/2022 1031   BUN 15 11/17/2020 1048   BUN 14.6 01/20/2014 1604   CREATININE 0.79 10/12/2022 1031   CREATININE 0.78 03/24/2019 1041   CREATININE 0.8 01/20/2014 1604      Component Value Date/Time   CALCIUM 9.7 10/12/2022 1031   CALCIUM 9.8 01/20/2014 1604   ALKPHOS 116 10/12/2022 1031   ALKPHOS 134 01/20/2014 1604   AST 27 10/12/2022 1031   AST 22 01/20/2014 1604   ALT 22 10/12/2022 1031   ALT 20 01/20/2014 1604   BILITOT 0.5 10/12/2022 1031   BILITOT <0.20 01/20/2014 1604

## 2022-10-12 NOTE — Assessment & Plan Note (Signed)
She is noted to have significant weight gain She has new onset of constipation and felt bloated Her examination is technically difficult due to significant central obesity I recommend CT imaging to assess and she agreed

## 2022-10-17 ENCOUNTER — Ambulatory Visit (HOSPITAL_BASED_OUTPATIENT_CLINIC_OR_DEPARTMENT_OTHER)
Admission: RE | Admit: 2022-10-17 | Discharge: 2022-10-17 | Disposition: A | Discharge status: Home / Self Care | Payer: Medicare Other | Source: Ambulatory Visit | Attending: Hematology and Oncology | Admitting: Hematology and Oncology

## 2022-10-17 DIAGNOSIS — K5730 Diverticulosis of large intestine without perforation or abscess without bleeding: Secondary | ICD-10-CM | POA: Diagnosis not present

## 2022-10-17 DIAGNOSIS — C541 Malignant neoplasm of endometrium: Secondary | ICD-10-CM | POA: Insufficient documentation

## 2022-10-17 MED ORDER — IOHEXOL 300 MG/ML  SOLN
100.0000 mL | Freq: Once | INTRAMUSCULAR | Status: AC | PRN
  Administered 2022-10-17: 100 mL via INTRAVENOUS

## 2022-10-26 ENCOUNTER — Inpatient Hospital Stay: Payer: Medicare Other | Admitting: Hematology and Oncology

## 2022-10-26 ENCOUNTER — Encounter: Payer: Self-pay | Admitting: Hematology and Oncology

## 2022-10-26 VITALS — BP 140/80 | HR 61 | Temp 98.3°F | Resp 19 | Wt 264.4 lb

## 2022-10-26 DIAGNOSIS — C541 Malignant neoplasm of endometrium: Secondary | ICD-10-CM

## 2022-10-26 DIAGNOSIS — K5909 Other constipation: Secondary | ICD-10-CM | POA: Diagnosis not present

## 2022-10-26 DIAGNOSIS — Z91199 Patient's noncompliance with other medical treatment and regimen due to unspecified reason: Secondary | ICD-10-CM | POA: Diagnosis not present

## 2022-10-26 DIAGNOSIS — Z79899 Other long term (current) drug therapy: Secondary | ICD-10-CM | POA: Diagnosis not present

## 2022-10-26 NOTE — Assessment & Plan Note (Signed)
CT imaging is reassuring that her symptom is not due to cancer recurrence She has good result with daily laxative and I recommend she continues the same

## 2022-10-26 NOTE — Assessment & Plan Note (Signed)
She has been noncompliant with dietary modification and have minimum exercise She has gained a lot of weight We discussed risk of cancer recurrence in association with obesity I recommend the patient to join a local gym

## 2022-10-26 NOTE — Progress Notes (Signed)
[facility] OFFICE PROGRESS NOTE  Patient Care Team: Agapito Games, MD as PCP - General (Family Medicine) Rollene Rotunda, MD as PCP - Cardiology (Cardiology) Cherlyn Roberts, MD (Dermatology) Barnett Abu, MD (Neurosurgery) Lesly Dukes, MD (Obstetrics and Gynecology) Carver Fila, MD as Consulting Physician (Gynecologic Oncology)  ASSESSMENT & PLAN:  Endometrial cancer [facility] [place]) I have reviewed recent blood work and imaging studies with the patient She has no signs of cancer recurrence The patient is reassured Plan to see her in a year for further follow-up She is aware of signs and symptoms to watch out for cancer recurrence We discussed importance of dietary modification and exercise  Other constipation CT imaging is reassuring that her symptom is not due to cancer recurrence She has good result with daily laxative and I recommend she continues the same  Obesity, Class III, BMI 40-49.9 (morbid obesity) (HCC) She has been noncompliant with dietary modification and have minimum exercise She has gained a lot of weight We discussed risk of cancer recurrence in association with obesity I recommend the patient to join a local gym  No orders of the defined types were placed in this encounter.   All questions were answered. The patient knows to call the clinic with any problems, questions or concerns. The total time spent in the appointment was 30 minutes encounter with patients including review of chart and various tests results, discussions about plan of care and coordination of care plan   Artis Delay, MD 10/26/2022 2:58 PM  INTERVAL HISTORY: Please see below for problem oriented charting. she returns for surveillance follow-up and review of CT imaging result with her wife She started taking daily MiraLAX with good results We discussed findings and reviewed imaging studies together We discussed future follow-up  REVIEW OF SYSTEMS:    Constitutional: Denies fevers, chills or abnormal weight loss Eyes: Denies blurriness of vision Ears, nose, mouth, throat, and face: Denies mucositis or sore throat Respiratory: Denies cough, dyspnea or wheezes Cardiovascular: Denies palpitation, chest discomfort or lower extremity swelling Skin: Denies abnormal skin rashes Lymphatics: Denies new lymphadenopathy or easy bruising Neurological:Denies numbness, tingling or new weaknesses Behavioral/Psych: Mood is stable, no new changes  All other systems were reviewed with the patient and are negative.  I have reviewed the past medical history, past surgical history, social history and family history with the patient and they are unchanged from previous note.  ALLERGIES:  has No Known Allergies.  MEDICATIONS:  Current Outpatient Medications  Medication Sig Dispense Refill   polyethylene glycol (MIRALAX / GLYCOLAX) 17 g packet Take 17 g by mouth daily.     AMBULATORY NON FORMULARY MEDICATION Medication Name: CPAP with humidifier with 11 cm water pressure.  Dx OSA.  See attached Sleep Study.  Please fax to [facility]. 1 Units 0   atorvastatin (LIPITOR) 20 MG tablet TAKE 1 TABLET BY MOUTH AT  BEDTIME 100 tablet 2   CALCIUM PO Take 1 tablet by mouth daily.     celecoxib (CELEBREX) 200 MG capsule TAKE 1 TO 2 CAPSULES BY MOUTH  DAILY AS NEEDED FOR PAIN 200 capsule 2   DULoxetine (CYMBALTA) 60 MG capsule TAKE 1 CAPSULE BY MOUTH DAILY 90 capsule 3   gabapentin (NEURONTIN) 300 MG capsule TAKE 2 CAPSULES BY MOUTH DAILY 200 capsule 2   Krill Oil 500 MG CAPS Take 1 capsule by mouth daily.     Multiple Vitamins-Minerals (CENTRUM SILVER 50+WOMEN) TABS Take 1 capsule by mouth daily.  Multiple Vitamins-Minerals (PRESERVISION AREDS) CAPS Take 1 capsule by mouth daily.     rOPINIRole (REQUIP) 0.5 MG tablet TAKE 1 TABLET BY MOUTH AT  BEDTIME 100 tablet 2   vitamin C (ASCORBIC ACID) 500 MG tablet Take 500 mg by mouth daily.     No current  facility-administered medications for this visit.    SUMMARY OF ONCOLOGIC HISTORY: Oncology History Overview Note  MSI Stable Negative genetics   Endometrial cancer (HCC)  07/28/2019 Pathology Results   Endometrium, biopsy - ENDOMETRIOID CARCINOMA - SEE COMMENT Microscopic Comment Based on the biopsy, the carcinoma appears FIGO grade 1.   08/01/2019 Imaging   CT abdomen and pelvis No evidence of abdominal or pelvic metastatic disease.   Small uterine fibroid.   Colonic diverticulosis, without radiographic evidence of diverticulitis.   Aortic Atherosclerosis (ICD10-I70.0).   08/14/2019 Surgery   Surgeon: Quinn Axe  Operation: Robotic-assisted laparoscopic total hysterectomy with bilateral salpingoophorectomy, SLN biopsy     Operative Findings:  : 8cm uterus which appeared grossly normal, normal appearing tubes and ovaries, no suspicious lymph nodes. Brisk oozing from skin incisions.    09/03/2019 Cancer Staging   Staging form: Corpus Uteri - Carcinoma and Carcinosarcoma, AJCC 8th Edition - Pathologic stage from 09/03/2019: FIGO Stage IIIA (pT3a, pN0, cM0) - Signed by Artis Delay, MD on 09/03/2019   09/04/2019 Pathology Results   FINAL MICROSCOPIC DIAGNOSIS:   A. LYMPH NODE, SENTINEL RIGHT OBTURATOR PROXIMAL, BIOPSY:  -  No carcinoma identified in one lymph node (0/1)  -  See comment   B. LYMPH NODE, SENTINEL RIGHT OBTURATOR DISTAL, BIOPSY:  -  No carcinoma identified in one lymph node (0/1)  -  See comment   C. LYMPH NODE, SENTINEL LEFT EXTERNAL ILIAC, BIOPSY:  -  No carcinoma identified in one lymph node (0/1)  -  See comment   D. UTERUS AND BILATERAL ADNEXA, HYSTERECTOMY WITH SALPINGO-OOPHORECTOMY:   Uterus:  -  Endometrioid carcinoma, FIGO grade 1  -  Leiomyoma (1.8 cm; largest)  -  See oncology table and comment below   Cervix:  -  Cervix with squamous metaplasia  -  No carcinoma identified   Bilateral Ovaries:  -  Endometrioid carcinoma involving  right ovary  -  No carcinoma involving left ovary  -  Endosalpingiosis   Bilateral Fallopian tubes:  -  No carcinoma identified   ONCOLOGY TABLE:   UTERUS, CARCINOMA OR CARCINOSARCOMA   Procedure: Total hysterectomy and bilateral salpingo-oophorectomy  Histologic type: Endometrioid carcinoma, NOS  Histologic Grade: FIGO grade 1  Myometrial invasion:       Depth of invasion: 3 mm       Myometrial thickness: 20 mm  Uterine Serosa Involvement: Not identified  Cervical stromal involvement: Not identified  Extent of involvement of other organs: Right ovary  Lymphovascular invasion: Not identified  Regional Lymph Nodes:       Examined:      3 Sentinel                               0 non-sentinel                               3 total        Lymph nodes with metastasis: 0        Isolated tumor cells (<0.2 mm): 0  Micrometastasis:  (>0.2 mm and < 2.0 mm): 0        Macrometastasis: (>2.0 mm): 0        Extracapsular extension: N/A  Representative Tumor Block: D10  MMR / MSI testing: Pending  Pathologic Stage Classification (pTNM, AJCC 8th edition):  pT3a, pN0  Comments: Pancytokeratin performed on the lymph nodes is negative. The right ovary has a focus of carcinoma.     09/11/2019 Procedure   Placement of single lumen port a cath via right internal jugular vein. The catheter tip lies at the cavo-atrial junction. A power injectable port a cath was placed and is ready for immediate use.     09/12/2019 - 12/26/2019 Chemotherapy   The patient had carboplatin and taxol for chemotherapy treatment.     01/19/2020 Imaging   1. Interval total hysterectomy with bilateral salpingo oophorectomy. No findings to suggest recurrent or metastatic disease. 2. Tiny hiatal hernia. 3. Left colonic diverticulosis without diverticulitis. 4. Aortic Atherosclerosis (ICD10-I70.0).   02/18/2021 Imaging   1. No evidence of metastatic disease in the chest, abdomen or pelvis. No evidence of a left  chest wall mass to correlate with reported palpable abnormality. 2. Marked sigmoid diverticulosis. 3. Small hiatal hernia. 4. Aortic Atherosclerosis (ICD10-I70.0).     11/07/2021 Imaging   1. Status post hysterectomy without evidence of local recurrence or abdominopelvic metastatic disease. 2. Sigmoid colonic diverticulosis without findings of acute diverticulitis. 3. Small hiatal hernia. 4.  Aortic Atherosclerosis (ICD10-I70.0).     02/06/2022 Procedure   Successful right IJ vein Port-A-Cath removal.    10/21/2022 Imaging   CT ABDOMEN PELVIS W CONTRAST  Result Date: 10/21/2022 CLINICAL DATA:  High-risk for ovarian carcinoma. Previous breast and uterine carcinoma. * Tracking Code: BO * EXAM: CT ABDOMEN AND PELVIS WITH CONTRAST TECHNIQUE: Multidetector CT imaging of the abdomen and pelvis was performed using the standard protocol following bolus administration of intravenous contrast. RADIATION DOSE REDUCTION: This exam was performed according to the departmental dose-optimization program which includes automated exposure control, adjustment of the mA and/or kV according to patient size and/or use of iterative reconstruction technique. CONTRAST:  OMNIPAQUE IOHEXOL 300 MG/ML  SOLN COMPARISON:  11/04/2021 FINDINGS: Lower Chest: No acute findings. Hepatobiliary: No suspicious hepatic masses identified. Gallbladder is unremarkable. No evidence of biliary ductal dilatation. Pancreas:  No mass or inflammatory changes. Spleen: Within normal limits in size and appearance. Adrenals/Urinary Tract: No suspicious masses identified. No evidence of ureteral calculi or hydronephrosis. Stomach/Bowel: No evidence of obstruction, inflammatory process or abnormal fluid collections. Diverticulosis is seen mainly involving the sigmoid colon, however there is no evidence of diverticulitis. Vascular/Lymphatic: No pathologically enlarged lymph nodes. No acute vascular findings. Reproductive: Prior hysterectomy noted.  Adnexal regions are unremarkable in appearance. Other:  None. Musculoskeletal:  No suspicious bone lesions identified. IMPRESSION: No evidence of malignancy or metastatic disease within the abdomen or pelvis. Colonic diverticulosis, without radiographic evidence of diverticulitis. Electronically Signed   By: Danae Orleans M.D.   On: 10/21/2022 12:51        PHYSICAL EXAMINATION: ECOG PERFORMANCE STATUS: 0 - Asymptomatic  Vitals:   10/26/22 1141  BP: (!) 140/80  Pulse: 61  Resp: 19  Temp: 98.3 F (36.8 C)  SpO2: 97%   Filed Weights   10/26/22 1141  Weight: 264 lb 6.4 oz (119.9 kg)    GENERAL:alert, no distress and comfortable   LABORATORY DATA:  I have reviewed the data as listed    Component Value Date/Time   NA 139  10/12/2022 1031   NA 141 11/17/2020 1048   NA 141 01/20/2014 1604   K 4.6 10/12/2022 1031   K 4.2 01/20/2014 1604   CL 104 10/12/2022 1031   CO2 30 10/12/2022 1031   CO2 28 01/20/2014 1604   GLUCOSE 105 (H) 10/12/2022 1031   GLUCOSE 99 01/20/2014 1604   BUN 11 10/12/2022 1031   BUN 15 11/17/2020 1048   BUN 14.6 01/20/2014 1604   CREATININE 0.79 10/12/2022 1031   CREATININE 0.78 03/24/2019 1041   CREATININE 0.8 01/20/2014 1604   CALCIUM 9.7 10/12/2022 1031   CALCIUM 9.8 01/20/2014 1604   PROT 7.0 10/12/2022 1031   PROT 7.2 11/17/2020 1048   PROT 7.3 01/20/2014 1604   ALBUMIN 4.2 10/12/2022 1031   ALBUMIN 4.6 11/17/2020 1048   ALBUMIN 4.0 01/20/2014 1604   AST 27 10/12/2022 1031   AST 22 01/20/2014 1604   ALT 22 10/12/2022 1031   ALT 20 01/20/2014 1604   ALKPHOS 116 10/12/2022 1031   ALKPHOS 134 01/20/2014 1604   BILITOT 0.5 10/12/2022 1031   BILITOT <0.20 01/20/2014 1604   GFRNONAA >60 10/12/2022 1031   GFRNONAA 81 03/24/2019 1041   GFRAA >60 10/24/2019 0737   GFRAA 94 03/24/2019 1041    No results found for: "SPEP", "UPEP"  Lab Results  Component Value Date   WBC 7.5 10/12/2022   NEUTROABS 4.8 10/12/2022   HGB 12.8 10/12/2022   HCT  38.5 10/12/2022   MCV 98.5 10/12/2022   PLT 255 10/12/2022      Chemistry      Component Value Date/Time   NA 139 10/12/2022 1031   NA 141 11/17/2020 1048   NA 141 01/20/2014 1604   K 4.6 10/12/2022 1031   K 4.2 01/20/2014 1604   CL 104 10/12/2022 1031   CO2 30 10/12/2022 1031   CO2 28 01/20/2014 1604   BUN 11 10/12/2022 1031   BUN 15 11/17/2020 1048   BUN 14.6 01/20/2014 1604   CREATININE 0.79 10/12/2022 1031   CREATININE 0.78 03/24/2019 1041   CREATININE 0.8 01/20/2014 1604      Component Value Date/Time   CALCIUM 9.7 10/12/2022 1031   CALCIUM 9.8 01/20/2014 1604   ALKPHOS 116 10/12/2022 1031   ALKPHOS 134 01/20/2014 1604   AST 27 10/12/2022 1031   AST 22 01/20/2014 1604   ALT 22 10/12/2022 1031   ALT 20 01/20/2014 1604   BILITOT 0.5 10/12/2022 1031   BILITOT <0.20 01/20/2014 1604       RADIOGRAPHIC STUDIES: I have reviewed imaging study with the patient I have personally reviewed the radiological images as listed and agreed with the findings in the report. CT ABDOMEN PELVIS W CONTRAST  Result Date: 10/21/2022 CLINICAL DATA:  High-risk for ovarian carcinoma. Previous breast and uterine carcinoma. * Tracking Code: BO * EXAM: CT ABDOMEN AND PELVIS WITH CONTRAST TECHNIQUE: Multidetector CT imaging of the abdomen and pelvis was performed using the standard protocol following bolus administration of intravenous contrast. RADIATION DOSE REDUCTION: This exam was performed according to the departmental dose-optimization program which includes automated exposure control, adjustment of the mA and/or kV according to patient size and/or use of iterative reconstruction technique. CONTRAST:  OMNIPAQUE IOHEXOL 300 MG/ML  SOLN COMPARISON:  11/04/2021 FINDINGS: Lower Chest: No acute findings. Hepatobiliary: No suspicious hepatic masses identified. Gallbladder is unremarkable. No evidence of biliary ductal dilatation. Pancreas:  No mass or inflammatory changes. Spleen: Within normal  limits in size and appearance. Adrenals/Urinary Tract: No suspicious  masses identified. No evidence of ureteral calculi or hydronephrosis. Stomach/Bowel: No evidence of obstruction, inflammatory process or abnormal fluid collections. Diverticulosis is seen mainly involving the sigmoid colon, however there is no evidence of diverticulitis. Vascular/Lymphatic: No pathologically enlarged lymph nodes. No acute vascular findings. Reproductive: Prior hysterectomy noted. Adnexal regions are unremarkable in appearance. Other:  None. Musculoskeletal:  No suspicious bone lesions identified. IMPRESSION: No evidence of malignancy or metastatic disease within the abdomen or pelvis. Colonic diverticulosis, without radiographic evidence of diverticulitis. Electronically Signed   By: Danae Orleans M.D.   On: 10/21/2022 12:51

## 2022-10-26 NOTE — Assessment & Plan Note (Signed)
I have reviewed recent blood work and imaging studies with the patient She has no signs of cancer recurrence The patient is reassured Plan to see her in a year for further follow-up She is aware of signs and symptoms to watch out for cancer recurrence We discussed importance of dietary modification and exercise

## 2022-11-10 ENCOUNTER — Encounter: Payer: Self-pay | Admitting: Family Medicine

## 2022-11-13 ENCOUNTER — Other Ambulatory Visit: Payer: Self-pay | Admitting: *Deleted

## 2022-11-13 DIAGNOSIS — I77810 Thoracic aortic ectasia: Secondary | ICD-10-CM

## 2022-11-28 ENCOUNTER — Other Ambulatory Visit (HOSPITAL_COMMUNITY)
Admission: RE | Admit: 2022-11-28 | Discharge: 2022-11-28 | Disposition: A | Discharge status: Home / Self Care | Payer: Medicare Other | Source: Ambulatory Visit | Attending: Oncology | Admitting: Oncology

## 2022-11-28 DIAGNOSIS — Z006 Encounter for examination for normal comparison and control in clinical research program: Secondary | ICD-10-CM | POA: Insufficient documentation

## 2022-11-30 ENCOUNTER — Telehealth: Payer: Self-pay

## 2022-11-30 NOTE — Telephone Encounter (Signed)
Pt called office stating she needs to reschedule her upcoming appointment with Dr. Pricilla Holm on 11/8. She states she has to take her mom to an appointment to get her aneurysm measured.   Earliest available is January 3 @ 3:15. Pt agrees to date and time.

## 2022-12-04 ENCOUNTER — Ambulatory Visit: Payer: Medicare Other

## 2022-12-04 DIAGNOSIS — R931 Abnormal findings on diagnostic imaging of heart and coronary circulation: Secondary | ICD-10-CM

## 2022-12-04 DIAGNOSIS — Z0389 Encounter for observation for other suspected diseases and conditions ruled out: Secondary | ICD-10-CM | POA: Diagnosis not present

## 2022-12-04 DIAGNOSIS — I77810 Thoracic aortic ectasia: Secondary | ICD-10-CM

## 2022-12-04 MED ORDER — IOHEXOL 350 MG/ML SOLN
100.0000 mL | Freq: Once | INTRAVENOUS | Status: AC | PRN
  Administered 2022-12-04: 100 mL via INTRAVENOUS

## 2022-12-05 LAB — HELIX MOLECULAR SCREEN: Genetic Analysis Overall Interpretation: NEGATIVE

## 2022-12-05 LAB — GENECONNECT MOLECULAR SCREEN

## 2022-12-08 ENCOUNTER — Inpatient Hospital Stay: Payer: Medicare Other | Admitting: Gynecologic Oncology

## 2022-12-21 ENCOUNTER — Ambulatory Visit: Payer: Medicare Other | Admitting: Family Medicine

## 2022-12-21 VITALS — BP 133/60 | HR 63 | Ht 64.96 in | Wt 257.0 lb

## 2022-12-21 DIAGNOSIS — Z0000 Encounter for general adult medical examination without abnormal findings: Secondary | ICD-10-CM

## 2022-12-21 DIAGNOSIS — R7301 Impaired fasting glucose: Secondary | ICD-10-CM

## 2022-12-21 DIAGNOSIS — M48061 Spinal stenosis, lumbar region without neurogenic claudication: Secondary | ICD-10-CM

## 2022-12-21 LAB — POCT GLYCOSYLATED HEMOGLOBIN (HGB A1C): Hemoglobin A1C: 5.6 % (ref 4.0–5.6)

## 2022-12-21 MED ORDER — DULOXETINE HCL 30 MG PO CPEP: 30.0000 mg | ORAL_CAPSULE | Freq: Every day | ORAL | 0 refills | Status: DC

## 2022-12-21 NOTE — Patient Instructions (Signed)
  Ms. Kasai , Thank you for taking time to come for your Medicare Wellness Visit. I appreciate your ongoing commitment to your health goals. Please review the following plan we discussed and let me know if I can assist you in the future.   These are the goals we discussed:  Goals      Exercise 3x per week (30 min per time)     Encourage routine exercise twice a week for 20 minutes with walking or biking, etc         This is a list of the screening recommended for you and due dates:  Health Maintenance  Topic Date Due   COVID-19 Vaccine (8 - 2023-24 season) 01/04/2023   Medicare Annual Wellness Visit  12/21/2023   Colon Cancer Screening  10/24/2027   DTaP/Tdap/Td vaccine (4 - Td or Tdap) 03/23/2029   Pneumonia Vaccine  Completed   Flu Shot  Completed   DEXA scan (bone density measurement)  Completed   Hepatitis C Screening  Completed   Zoster (Shingles) Vaccine  Completed   HPV Vaccine  Aged Out

## 2022-12-21 NOTE — Assessment & Plan Note (Signed)
Is now on an NSAID for pain and would like to try tapering off the Cymbalta which was originally started for musculoskeletal pain.  Currently taking 60 mg will decrease to 30 mg and taper off over the next 6 weeks.  That way she can see if the NSAID by itself is helpful to try to reduce her medication burden.  She was also concerned that the Cymbalta might be contributing to weight gain as well.

## 2022-12-21 NOTE — Progress Notes (Signed)
Annual Wellness Visit     Patient: Patricia Waters, Female    DOB: Jul 21, 1955, 67 y.o.   MRN: [id number]  Subjective  Chief Complaint  Patient presents with   wellness exam    Patricia Waters is a 67 y.o. female who presents today for her Annual Wellness Visit. She reports consuming a general diet.  Yardwork to stay active   She generally feels well. She reports sleeping fairly well. She does have additional problems to discuss today.   She wanted to know if Medicare pays for any of the newer weight loss medications.  She was going to the weight and wellness for a while and did find it helpful.  She is no longer actively going but is trying to use some of those same strategies right now she is doing a lot of yard work but is not necessarily consistent with exercise.  HPI       Medications: Outpatient Medications Prior to Visit  Medication Sig   AMBULATORY NON FORMULARY MEDICATION Medication Name: CPAP with humidifier with 11 cm water pressure.  Dx OSA.  See attached Sleep Study.  Please fax to [facility].   atorvastatin (LIPITOR) 20 MG tablet TAKE 1 TABLET BY MOUTH AT  BEDTIME   CALCIUM PO Take 1 tablet by mouth daily.   celecoxib (CELEBREX) 200 MG capsule TAKE 1 TO 2 CAPSULES BY MOUTH  DAILY AS NEEDED FOR PAIN   gabapentin (NEURONTIN) 300 MG capsule TAKE 2 CAPSULES BY MOUTH DAILY   Multiple Vitamins-Minerals (CENTRUM SILVER 50+WOMEN) TABS Take 1 capsule by mouth daily.   Multiple Vitamins-Minerals (PRESERVISION AREDS) CAPS Take 1 capsule by mouth daily.   polyethylene glycol (MIRALAX / GLYCOLAX) 17 g packet Take 17 g by mouth daily.   rOPINIRole (REQUIP) 0.5 MG tablet TAKE 1 TABLET BY MOUTH AT  BEDTIME   vitamin C (ASCORBIC ACID) 500 MG tablet Take 500 mg by mouth daily.   [DISCONTINUED] DULoxetine (CYMBALTA) 60 MG capsule TAKE 1 CAPSULE BY MOUTH DAILY   [DISCONTINUED] Krill Oil 500 MG CAPS Take 1 capsule by mouth daily.   No facility-administered medications prior to  visit.    No Known Allergies  Patient Care Team: Agapito Games, MD as PCP - General (Family Medicine) Rollene Rotunda, MD as PCP - Cardiology (Cardiology) Cherlyn Roberts, MD (Dermatology) Barnett Abu, MD (Neurosurgery) Lesly Dukes, MD (Obstetrics and Gynecology) Carver Fila, MD as Consulting Physician (Gynecologic Oncology)  ROS      Objective  BP 133/60   Pulse 63   Ht 5' 4.96" (1.65 m)   Wt 257 lb (116.6 kg)   SpO2 98%   BMI 42.82 kg/m    Physical Exam Vitals and nursing note reviewed.  Constitutional:      Appearance: Normal appearance.  HENT:     Head: Normocephalic and atraumatic.  Eyes:     Conjunctiva/sclera: Conjunctivae normal.  Cardiovascular:     Rate and Rhythm: Normal rate and regular rhythm.  Pulmonary:     Effort: Pulmonary effort is normal.     Breath sounds: Normal breath sounds.  Skin:    General: Skin is warm and dry.  Neurological:     Mental Status: She is alert.  Psychiatric:        Mood and Affect: Mood normal.       Most recent functional status assessment:    12/21/2022   10:36 AM  In your present state of health, do you have any difficulty performing  the following activities:  Hearing? 1  Vision? 0  Difficulty concentrating or making decisions? 0  Walking or climbing stairs? 0  Dressing or bathing? 0  Doing errands, shopping? 0  Preparing Food and eating ? N  Using the Toilet? N  In the past six months, have you accidently leaked urine? N  Do you have problems with loss of bowel control? N  Managing your Medications? N  Managing your Finances? N  Housekeeping or managing your Housekeeping? N   Most recent fall risk assessment:    12/21/2022   10:27 AM  Fall Risk   Falls in the past year? 1  Number falls in past yr: 0  Injury with Fall? 0  Risk for fall due to : Other (Comment)  Risk for fall due to: Comment RUNNING IN [place]  Follow up Falls evaluation completed    Most recent  depression screenings:    12/21/2022   10:28 AM 12/19/2021    9:17 AM  PHQ 2/9 Scores  PHQ - 2 Score 0 0   Most recent cognitive screening:    12/21/2022   10:11 AM  6CIT Screen  What Year? 0 points  What month? 0 points  What time? 0 points  Count back from 20 0 points  Months in reverse 0 points  Repeat phrase 4 points  Total Score 4 points   Most recent Audit-C alcohol use screening    12/21/2022    8:50 AM  Alcohol Use Disorder Test (AUDIT)  1. How often do you have a drink containing alcohol? 2  2. How many drinks containing alcohol do you have on a typical day when you are drinking? 2  3. How often do you have six or more drinks on one occasion? 1  AUDIT-C Score 5  4. How often during the last year have you found that you were not able to stop drinking once you had started? 1  5. How often during the last year have you failed to do what was normally expected from you because of drinking? 0  6. How often during the last year have you needed a first drink in the morning to get yourself going after a heavy drinking session? 0  7. How often during the last year have you had a feeling of guilt of remorse after drinking? 0  8. How often during the last year have you been unable to remember what happened the night before because you had been drinking? 0  9. Have you or someone else been injured as a result of your drinking? 0  10. Has a relative or friend or a doctor or another health worker been concerned about your drinking or suggested you cut down? 4  Alcohol Use Disorder Identification Test Final Score (AUDIT) 10   A score of 3 or more in women, and 4 or more in men indicates increased risk for alcohol abuse, EXCEPT if all of the points are from question 1   Vision/Hearing Screen: No results found.    Results for orders placed or performed in visit on 12/21/22  POCT HgB A1C  Result Value Ref Range   Hemoglobin A1C 5.6 4.0 - 5.6 %   HbA1c POC (<> result, manual  entry)     HbA1c, POC (prediabetic range)     HbA1c, POC (controlled diabetic range)        Assessment & Plan   Annual wellness visit done today including the all of the following: Reviewed patient's Family  Medical History Reviewed and updated list of patient's medical providers Assessment of cognitive impairment was done Assessed patient's functional ability Established a written schedule for health screening services Health Risk Assessent Completed and Reviewed  Exercise Activities and Dietary recommendations  Goals      Exercise 3x per week (30 min per time)     Encourage routine exercise twice a week for 20 minutes with walking or biking, etc         Immunization History  Administered Date(s) Administered   Fluad Quad(high Dose 65+) 10/20/2021   Influenza Inj Mdck Quad Pf 10/13/2020   Influenza Split 10/12/2011   Influenza,inj,Quad PF,6+ Mos 11/08/2012, 09/25/2013, 10/19/2014, 10/16/2016, 10/24/2017, 09/17/2018, 10/24/2019   Influenza-Unspecified 11/01/2015, 11/09/2022   Moderna Covid-19 Fall Seasonal Vaccine 71yrs & older 11/01/2021   Moderna SARS-COV2 Booster Vaccination 11/01/2021   PFIZER Comirnaty(Gray Top)Covid-19 Tri-Sucrose Vaccine 07/14/2020   PFIZER(Purple Top)SARS-COV-2 Vaccination 04/28/2019, 05/21/2019, 10/03/2019   PNEUMOCOCCAL CONJUGATE-20 10/20/2021   Pfizer Covid-19 Vaccine Bivalent Booster 64yrs & up 10/13/2020   Pfizer(Comirnaty)Fall Seasonal Vaccine 12 years and older 07/28/2022, 11/09/2022   Td 01/31/1999, 10/16/2008   Tdap 03/24/2019   Zoster Recombinant(Shingrix) 10/16/2016, 12/15/2016    Health Maintenance  Topic Date Due   COVID-19 Vaccine (8 - 2023-24 season) 01/04/2023   Medicare Annual Wellness (AWV)  12/21/2023   Colonoscopy  10/24/2027   DTaP/Tdap/Td (4 - Td or Tdap) 03/23/2029   Pneumonia Vaccine 25+ Years old  Completed   INFLUENZA VACCINE  Completed   DEXA SCAN  Completed   Hepatitis C Screening  Completed   Zoster Vaccines-  Shingrix  Completed   HPV VACCINES  Aged Out     Discussed health benefits of physical activity, and encouraged her to engage in regular exercise appropriate for her age and condition.    Problem List Items Addressed This Visit       Endocrine   IFG (impaired fasting glucose)   Relevant Orders   POCT HgB A1C (Completed)     Other   SPINAL STENOSIS, LUMBAR    Is now on an NSAID for pain and would like to try tapering off the Cymbalta which was originally started for musculoskeletal pain.  Currently taking 60 mg will decrease to 30 mg and taper off over the next 6 weeks.  That way she can see if the NSAID by itself is helpful to try to reduce her medication burden.  She was also concerned that the Cymbalta might be contributing to weight gain as well.      Other Visit Diagnoses     Medicare annual wellness visit, subsequent    -  Primary       Return in about 1 year (around 12/21/2023) for Medicare Wellness Exam .     Nani Gasser, MD

## 2023-01-17 ENCOUNTER — Other Ambulatory Visit: Payer: Self-pay | Admitting: Family Medicine

## 2023-01-25 ENCOUNTER — Ambulatory Visit (INDEPENDENT_AMBULATORY_CARE_PROVIDER_SITE_OTHER): Payer: Medicare Other | Admitting: Family Medicine

## 2023-01-25 VITALS — BP 157/73 | HR 70 | Ht 64.96 in | Wt 265.2 lb

## 2023-01-25 DIAGNOSIS — J329 Chronic sinusitis, unspecified: Secondary | ICD-10-CM

## 2023-01-25 DIAGNOSIS — R051 Acute cough: Secondary | ICD-10-CM

## 2023-01-25 DIAGNOSIS — J40 Bronchitis, not specified as acute or chronic: Secondary | ICD-10-CM | POA: Diagnosis not present

## 2023-01-25 MED ORDER — ALBUTEROL SULFATE HFA 108 (90 BASE) MCG/ACT IN AERS
2.0000 | INHALATION_SPRAY | Freq: Four times a day (QID) | RESPIRATORY_TRACT | 0 refills | Status: DC | PRN
Start: 2023-01-25 — End: 2023-06-19

## 2023-01-25 MED ORDER — HYDROCOD POLI-CHLORPHE POLI ER 10-8 MG/5ML PO SUER
5.0000 mL | Freq: Two times a day (BID) | ORAL | 0 refills | Status: DC | PRN
Start: 2023-01-25 — End: 2023-06-19

## 2023-01-25 MED ORDER — AZITHROMYCIN 250 MG PO TABS
ORAL_TABLET | ORAL | 0 refills | Status: AC
Start: 2023-01-25 — End: 2023-01-30

## 2023-01-25 MED ORDER — METHYLPREDNISOLONE 4 MG PO TBPK
ORAL_TABLET | ORAL | 0 refills | Status: DC
Start: 2023-01-25 — End: 2023-06-19

## 2023-01-25 NOTE — Assessment & Plan Note (Signed)
Due to duration of symptoms and congestion on exam will go ahead and treat with zpack- this helped the wife when she was sick two weeks ago so we will do same treatment - given albuterol inhaler for cough and tussionex  - also given medrol dose pack to help with inflammation

## 2023-01-25 NOTE — Patient Instructions (Addendum)
Increase fluids, Vit C, vit D, and zinc

## 2023-01-25 NOTE — Progress Notes (Signed)
Acute Office Visit  Subjective:     Patient ID: LILEE REMENTER, female    DOB: 03/25/1955, 67 y.o.   MRN: [id number]  Chief Complaint  Patient presents with   Sore Throat   Cough   Nasal Congestion    Pt states has been going on for 1-2 wks cough, congestion, and sore throat    HPI Patient is in today for acute visit. Has concerns of URI type symptoms and feels like symptoms have gotten worse. Notes a worsening of cough. Symptoms have been present for 1-2 weeks. Admits to sore throat.   Review of Systems  Constitutional:  Negative for chills and fever.  HENT:  Positive for congestion.   Respiratory:  Positive for cough. Negative for shortness of breath.   Cardiovascular:  Negative for chest pain.  Neurological:  Negative for headaches.        Objective:    BP (!) 157/73 (BP Location: Left Arm, Patient Position: Sitting, Cuff Size: Large)   Pulse 70   Ht 5' 4.96" (1.65 m)   Wt 265 lb 4 oz (120.3 kg)   SpO2 100%   BMI 44.19 kg/m    Physical Exam Vitals and nursing note reviewed.  Constitutional:      General: She is not in acute distress.    Appearance: Normal appearance.  HENT:     Head: Normocephalic and atraumatic.     Right Ear: External ear normal.     Left Ear: External ear normal.     Nose: Nose normal.  Eyes:     Conjunctiva/sclera: Conjunctivae normal.  Cardiovascular:     Rate and Rhythm: Normal rate and regular rhythm.  Pulmonary:     Effort: Pulmonary effort is normal.     Breath sounds: Normal breath sounds.  Neurological:     General: No focal deficit present.     Mental Status: She is alert and oriented to person, place, and time.  Psychiatric:        Mood and Affect: Mood normal.        Behavior: Behavior normal.        Thought Content: Thought content normal.        Judgment: Judgment normal.     No results found for any visits on 01/25/23.      Assessment & Plan:   Problem List Items Addressed This Visit       Respiratory    Sinobronchitis - Primary   Due to duration of symptoms and congestion on exam will go ahead and treat with zpack- this helped the wife when she was sick two weeks ago so we will do same treatment - given albuterol inhaler for cough and tussionex  - also given medrol dose pack to help with inflammation      Relevant Medications   azithromycin (ZITHROMAX) 250 MG tablet   methylPREDNISolone (MEDROL DOSEPAK) 4 MG TBPK tablet   albuterol (VENTOLIN HFA) 108 (90 Base) MCG/ACT inhaler   chlorpheniramine-HYDROcodone (TUSSIONEX) 10-8 MG/5ML   Other Visit Diagnoses       Acute cough       Relevant Medications   chlorpheniramine-HYDROcodone (TUSSIONEX) 10-8 MG/5ML       Meds ordered this encounter  Medications   azithromycin (ZITHROMAX) 250 MG tablet    Sig: Take 2 tablets on day 1, then 1 tablet daily on days 2 through 5    Dispense:  6 tablet    Refill:  0   methylPREDNISolone (MEDROL DOSEPAK) 4 MG  TBPK tablet    Sig: Follow instructions per pill pack    Dispense:  21 tablet    Refill:  0   albuterol (VENTOLIN HFA) 108 (90 Base) MCG/ACT inhaler    Sig: Inhale 2 puffs into the lungs every 6 (six) hours as needed for wheezing or shortness of breath.    Dispense:  8 g    Refill:  0   chlorpheniramine-HYDROcodone (TUSSIONEX) 10-8 MG/5ML    Sig: Take 5 mLs by mouth every 12 (twelve) hours as needed for cough (cough, will cause drowsiness.).    Dispense:  120 mL    Refill:  0    Return if symptoms worsen or fail to improve.  Charlton Amor, DO

## 2023-02-01 ENCOUNTER — Telehealth: Payer: Self-pay

## 2023-02-01 NOTE — Telephone Encounter (Signed)
 Patricia Waters called office stating she has walking pneumonia and needs to reschedule her appointment for tomorrow,1/3, with Dr.Tucker.   Pt has been rescheduled to 4/10 @ 3:00p (first available with Dr.Tucker) Pt is aware she will be put on a cancellation list if something earlier becomes available.

## 2023-02-01 NOTE — Telephone Encounter (Signed)
 Per Dr.Tucker:Please offer her an appointment with Efraim Kaufmann - she could see her now and see me for her next follow-up.     pt is scheduled with Warner Mccreedy NP for 02/06/23 @ 1:00 pt agreed to date and time.

## 2023-02-02 ENCOUNTER — Inpatient Hospital Stay: Payer: Medicare Other | Admitting: Gynecologic Oncology

## 2023-02-02 DIAGNOSIS — C541 Malignant neoplasm of endometrium: Secondary | ICD-10-CM

## 2023-02-05 ENCOUNTER — Telehealth: Payer: Self-pay | Admitting: *Deleted

## 2023-02-05 NOTE — Telephone Encounter (Signed)
 Spoke with Ms. Kissick and Patricia Waters states she still has a cough and is fatigued. Advised patient we would re-schedule her appointment with Eleanor Epps, NP for a later date this month. Patricia Waters verbalized understanding and states she will call the office back to schedule that appointment.

## 2023-02-06 ENCOUNTER — Inpatient Hospital Stay: Payer: Medicare Other | Admitting: Gynecologic Oncology

## 2023-02-06 NOTE — Telephone Encounter (Signed)
 Patient returned call from office to reschedule her appointment with Warner Mccreedy, NP. Pt was given an appt.on Tuesday, January 14 th at 1pm. Pt agreed to date and time.

## 2023-02-06 NOTE — Telephone Encounter (Signed)
 Attempted to reach patient for reschedule with Warner Mccreedy, NP. Left voicemail requesting call back.

## 2023-02-13 ENCOUNTER — Inpatient Hospital Stay: Payer: Medicare Other | Admitting: Gynecologic Oncology

## 2023-02-13 ENCOUNTER — Inpatient Hospital Stay: Payer: Medicare Other | Attending: Gynecologic Oncology

## 2023-02-13 ENCOUNTER — Telehealth: Payer: Self-pay

## 2023-02-13 VITALS — BP 130/84 | HR 62 | Temp 98.9°F | Resp 18 | Ht 64.0 in | Wt 262.2 lb

## 2023-02-13 DIAGNOSIS — Z08 Encounter for follow-up examination after completed treatment for malignant neoplasm: Secondary | ICD-10-CM

## 2023-02-13 DIAGNOSIS — Z8542 Personal history of malignant neoplasm of other parts of uterus: Secondary | ICD-10-CM

## 2023-02-13 DIAGNOSIS — C541 Malignant neoplasm of endometrium: Secondary | ICD-10-CM

## 2023-02-13 DIAGNOSIS — Z90710 Acquired absence of both cervix and uterus: Secondary | ICD-10-CM | POA: Diagnosis not present

## 2023-02-13 DIAGNOSIS — R5383 Other fatigue: Secondary | ICD-10-CM

## 2023-02-13 DIAGNOSIS — Z90722 Acquired absence of ovaries, bilateral: Secondary | ICD-10-CM | POA: Insufficient documentation

## 2023-02-13 DIAGNOSIS — Z9221 Personal history of antineoplastic chemotherapy: Secondary | ICD-10-CM | POA: Insufficient documentation

## 2023-02-13 DIAGNOSIS — K5909 Other constipation: Secondary | ICD-10-CM

## 2023-02-13 DIAGNOSIS — Z9079 Acquired absence of other genital organ(s): Secondary | ICD-10-CM | POA: Diagnosis not present

## 2023-02-13 LAB — CBC (CANCER CENTER ONLY)
HCT: 39.2 % (ref 36.0–46.0)
Hemoglobin: 13.1 g/dL (ref 12.0–15.0)
MCH: 32 pg (ref 26.0–34.0)
MCHC: 33.4 g/dL (ref 30.0–36.0)
MCV: 95.8 fL (ref 80.0–100.0)
Platelet Count: 260 10*3/uL (ref 150–400)
RBC: 4.09 MIL/uL (ref 3.87–5.11)
RDW: 12.6 % (ref 11.5–15.5)
WBC Count: 8.1 10*3/uL (ref 4.0–10.5)
nRBC: 0 % (ref 0.0–0.2)

## 2023-02-13 LAB — BASIC METABOLIC PANEL
Anion gap: 5 (ref 5–15)
BUN: 9 mg/dL (ref 8–23)
CO2: 30 mmol/L (ref 22–32)
Calcium: 10.3 mg/dL (ref 8.9–10.3)
Chloride: 105 mmol/L (ref 98–111)
Creatinine, Ser: 0.75 mg/dL (ref 0.44–1.00)
GFR, Estimated: >60 mL/min (ref >60)
Glucose, Bld: 101 mg/dL — ABNORMAL HIGH (ref 70–99)
Potassium: 4.5 mmol/L (ref 3.5–5.1)
Sodium: 140 mmol/L (ref 135–145)

## 2023-02-13 NOTE — Telephone Encounter (Signed)
-----   Message from Patricia Waters sent at 02/13/2023  2:27 PM EST ----- I released her labs from today in [place]. Could you please reach out and let her know labs looked normal. Hopefully the farther out she gets from her recent pneumonia, her fatigue will improve. Please let her know to call for any needs. Thanks!

## 2023-02-13 NOTE — Patient Instructions (Addendum)
 It was good to see you today!  No concerning findings on today's exam. I do not see or feel any evidence of cancer recurrence.  We will check a CBC and Bmet to look at your blood count levels and electrolytes given the fatigue you are having. We will contact you with the results.   Bowel regimen recommendations include: miralax every other day and you can also decrease to 1/2 capful. You can add in a stool softener as well if needed.   Plan to follow up with Dr. Viktoria in April 2025 or sooner if needed. You have Dr. Lonn scheduled in September 2025.    As always, please call if you develop any new and concerning symptoms before your next visit.   Symptoms to report to your health care team include vaginal bleeding, rectal bleeding, bloating, weight loss without effort, new and persistent pain, new and  persistent fatigue, new leg swelling, new masses (i.e., bumps in your neck or groin), new and persistent cough, new and persistent nausea and vomiting, change in bowel or bladder habits, and any other concerns.

## 2023-02-13 NOTE — Progress Notes (Signed)
 Gynecologic Oncology Return Clinic Visit  02/13/2023    Reason for Visit: Surveillance visit in the setting of uterine cancer  Treatment History: Oncology History Overview Note  MSI Stable Negative genetics   Endometrial cancer (HCC)  07/28/2019 Pathology Results   Endometrium, biopsy - ENDOMETRIOID CARCINOMA - SEE COMMENT Microscopic Comment Based on the biopsy, the carcinoma appears FIGO grade 1.   08/01/2019 Imaging   CT abdomen and pelvis No evidence of abdominal or pelvic metastatic disease.   Small uterine fibroid.   Colonic diverticulosis, without radiographic evidence of diverticulitis.   Aortic Atherosclerosis (ICD10-I70.0).   08/14/2019 Surgery   Surgeon: Eloy Maurilio Fitch  Operation: Robotic-assisted laparoscopic total hysterectomy with bilateral salpingoophorectomy, SLN biopsy     Operative Findings:  : 8cm uterus which appeared grossly normal, normal appearing tubes and ovaries, no suspicious lymph nodes. Brisk oozing from skin incisions.    09/03/2019 Cancer Staging   Staging form: Corpus Uteri - Carcinoma and Carcinosarcoma, AJCC 8th Edition - Pathologic stage from 09/03/2019: FIGO Stage IIIA (pT3a, pN0, cM0) - Signed by Lonn Hicks, MD on 09/03/2019   09/04/2019 Pathology Results   FINAL MICROSCOPIC DIAGNOSIS:   A. LYMPH NODE, SENTINEL RIGHT OBTURATOR PROXIMAL, BIOPSY:  -  No carcinoma identified in one lymph node (0/1)  -  See comment   B. LYMPH NODE, SENTINEL RIGHT OBTURATOR DISTAL, BIOPSY:  -  No carcinoma identified in one lymph node (0/1)  -  See comment   C. LYMPH NODE, SENTINEL LEFT EXTERNAL ILIAC, BIOPSY:  -  No carcinoma identified in one lymph node (0/1)  -  See comment   D. UTERUS AND BILATERAL ADNEXA, HYSTERECTOMY WITH SALPINGO-OOPHORECTOMY:   Uterus:  -  Endometrioid carcinoma, FIGO grade 1  -  Leiomyoma (1.8 cm; largest)  -  See oncology table and comment below   Cervix:  -  Cervix with squamous metaplasia  -  No carcinoma identified    Bilateral Ovaries:  -  Endometrioid carcinoma involving right ovary  -  No carcinoma involving left ovary  -  Endosalpingiosis   Bilateral Fallopian tubes:  -  No carcinoma identified   ONCOLOGY TABLE:   UTERUS, CARCINOMA OR CARCINOSARCOMA   Procedure: Total hysterectomy and bilateral salpingo-oophorectomy  Histologic type: Endometrioid carcinoma, NOS  Histologic Grade: FIGO grade 1  Myometrial invasion:       Depth of invasion: 3 mm       Myometrial thickness: 20 mm  Uterine Serosa Involvement: Not identified  Cervical stromal involvement: Not identified  Extent of involvement of other organs: Right ovary  Lymphovascular invasion: Not identified  Regional Lymph Nodes:       Examined:      3 Sentinel                               0 non-sentinel                               3 total        Lymph nodes with metastasis: 0        Isolated tumor cells (<0.2 mm): 0        Micrometastasis:  (>0.2 mm and < 2.0 mm): 0        Macrometastasis: (>2.0 mm): 0        Extracapsular extension: N/A  Representative Tumor Block: D10  MMR / MSI testing: Pending  Pathologic Stage Classification (pTNM, AJCC 8th edition):  pT3a, pN0  Comments: Pancytokeratin performed on the lymph nodes is negative. The right ovary has a focus of carcinoma.     09/11/2019 Procedure   Placement of single lumen port a cath via right internal jugular vein. The catheter tip lies at the cavo-atrial junction. A power injectable port a cath was placed and is ready for immediate use.     09/12/2019 - 12/26/2019 Chemotherapy   The patient had carboplatin  and taxol  for chemotherapy treatment.     01/19/2020 Imaging   1. Interval total hysterectomy with bilateral salpingo oophorectomy. No findings to suggest recurrent or metastatic disease. 2. Tiny hiatal hernia. 3. Left colonic diverticulosis without diverticulitis. 4. Aortic Atherosclerosis (ICD10-I70.0).   02/18/2021 Imaging   1. No evidence of metastatic  disease in the chest, abdomen or pelvis. No evidence of a left chest wall mass to correlate with reported palpable abnormality. 2. Marked sigmoid diverticulosis. 3. Small hiatal hernia. 4. Aortic Atherosclerosis (ICD10-I70.0).     11/07/2021 Imaging   1. Status post hysterectomy without evidence of local recurrence or abdominopelvic metastatic disease. 2. Sigmoid colonic diverticulosis without findings of acute diverticulitis. 3. Small hiatal hernia. 4.  Aortic Atherosclerosis (ICD10-I70.0).     02/06/2022 Procedure   Successful right IJ vein Port-A-Cath removal.    10/21/2022 Imaging   CT ABDOMEN PELVIS W CONTRAST  Result Date: 10/21/2022 CLINICAL DATA:  High-risk for ovarian carcinoma. Previous breast and uterine carcinoma. * Tracking Code: BO * EXAM: CT ABDOMEN AND PELVIS WITH CONTRAST TECHNIQUE: Multidetector CT imaging of the abdomen and pelvis was performed using the standard protocol following bolus administration of intravenous contrast. RADIATION DOSE REDUCTION: This exam was performed according to the departmental dose-optimization program which includes automated exposure control, adjustment of the mA and/or kV according to patient size and/or use of iterative reconstruction technique. CONTRAST:  OMNIPAQUE  IOHEXOL  300 MG/ML  SOLN COMPARISON:  11/04/2021 FINDINGS: Lower Chest: No acute findings. Hepatobiliary: No suspicious hepatic masses identified. Gallbladder is unremarkable. No evidence of biliary ductal dilatation. Pancreas:  No mass or inflammatory changes. Spleen: Within normal limits in size and appearance. Adrenals/Urinary Tract: No suspicious masses identified. No evidence of ureteral calculi or hydronephrosis. Stomach/Bowel: No evidence of obstruction, inflammatory process or abnormal fluid collections. Diverticulosis is seen mainly involving the sigmoid colon, however there is no evidence of diverticulitis. Vascular/Lymphatic: No pathologically enlarged lymph nodes. No  acute vascular findings. Reproductive: Prior hysterectomy noted. Adnexal regions are unremarkable in appearance. Other:  None. Musculoskeletal:  No suspicious bone lesions identified. IMPRESSION: No evidence of malignancy or metastatic disease within the abdomen or pelvis. Colonic diverticulosis, without radiographic evidence of diverticulitis. Electronically Signed   By: Norleen DELENA Kil M.D.   On: 10/21/2022 12:51        Interval History: The patient presents to the office today for continued endometrial cancer follow-up.  She reports overall doing well since her last visit.  Over the holiday season, she was diagnosed with walking pneumonia and was placed on steroids.  She continues to report fatigue after this and reports intermittent shortness of breath. She has been eating ice but states she has always done this since she was a small child. She did have anemia as a child of note.  Denies chest pain or dyspnea.  Reports a good appetite with no unintentional change in weight.  No abdominal pain or pelvic pain reported.  She reports feeling bloated intermittently which she relates to her weight carried in the  abdomen. Mild bladder incontinence has unchanged.  No dysuria or hematuria.  No vaginal discharge or bleeding reported.  No vulvar lesions or irritation reported.  Bowels are functioning with intermittent constipation.  She has taken Miralax as needed but states this can sometimes cause looser stools.  No rectal bleeding reported.  No significant lower extremity edema reported. She has chronic back pain and is currently weaning off Cymbalta  (thought could be possibly contributing to weight gain). She has started taking Celebrex  as an alternative for her pain. She had a CT scan of her abdomen and pelvis in September 2024 and a CT chest on 12/04/2022 to rule out aneurysm which was negative. No symptoms concerning for recurrence voiced.   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     BILATERAL TOTAL MASTECTOMY WITH AXILLARY LYMPH NODE DISSECTION Bilateral 2005   BREAST LUMPECTOMY  1994   right   CERVICAL FUSION  1995   C5-C7   IR IMAGING GUIDED PORT INSERTION  09/11/2019   IR REMOVAL TUN ACCESS W/ PORT W/O FL MOD SED  02/06/2022   MASTECTOMY  2005   Bilat    ROBOTIC ASSISTED TOTAL HYSTERECTOMY WITH BILATERAL SALPINGO OOPHERECTOMY N/A 08/14/2019   Procedure: XI ROBOTIC ASSISTED TOTAL HYSTERECTOMY WITH BILATERAL SALPINGO OOPHORECTOMY;  Surgeon: Eloy Herring, MD;  Location: [facility];  Service: Gynecology;  Laterality: N/A;   SENTINEL NODE BIOPSY N/A 08/14/2019   Procedure: SENTINEL NODE BIOPSY;  Surgeon: Eloy Herring, MD;  Location: [facility];  Service: Gynecology;  Laterality: N/A;   WISDOM TOOTH EXTRACTION Bilateral 2004    Family History  Problem Relation Age of Onset   Stroke Mother    Hyperlipidemia Mother    Skin cancer Mother    Breast cancer Mother 92       ? recurrence at 53   Endometrial cancer Mother 66   Hypertension Mother    Cancer Mother    Obesity Mother    Diabetes Father    Hyperlipidemia Father    Heart attack Father 79       CABG   Colon cancer Father 57   Prostate cancer Father 53       Gleason 9   Hypertension Father    Heart disease Father    Cancer Father    Sleep apnea Father    Alcoholism Father    Obesity Father    Hyperlipidemia Sister    Breast cancer Sister 27   Heart attack Maternal Grandmother    Skin cancer Maternal Grandmother    Rheum arthritis Maternal Grandmother    Heart  attack Maternal Grandfather    Diabetes Paternal Grandmother    Heart attack Paternal Grandmother    Rheum arthritis Paternal Grandmother    Heart attack Paternal Grandfather    Lymphoma Maternal Aunt 58   Kidney cancer Maternal Aunt 60  Cervical cancer Cousin 29    Social History   Socioeconomic History   Marital status: Married    Spouse name: Engineer, Drilling   Number of children: 0   Years of education: Not on file   Highest education level: Associate degree: academic program  Occupational History   Occupation: Retired Civil Engineer, Contracting: [employer]    Comment: [employer]  Tobacco Use   Smoking status: Former    Current packs/day: 0.00    Types: Cigarettes    Quit date: 10/31/2006    Years since quitting: 16.2   Smokeless tobacco: Never  Vaping Use   Vaping status: Never Used  Substance and Sexual Activity   Alcohol use: Yes   Drug use: No   Sexual activity: Not Currently    Partners: Female    Birth control/protection: None  Other Topics Concern   Not on file  Social History Narrative   Lives with her partner, Jonetta. Former psychologist, forensic.    Social Drivers of Corporate Investment Banker Strain: Low Risk  (01/25/2023)   Overall Financial Resource Strain (CARDIA)    Difficulty of Paying Living Expenses: Not hard at all  Food Insecurity: No Food Insecurity (01/25/2023)   Hunger Vital Sign    Worried About Running Out of Food in the Last Year: Never true    Ran Out of Food in the Last Year: Never true  Transportation Needs: No Transportation Needs (01/25/2023)   PRAPARE - Administrator, Civil Service (Medical): No    Lack of Transportation (Non-Medical): No  Physical Activity: Unknown (01/25/2023)   Exercise Vital Sign    Days of Exercise per Week: Patient declined    Minutes of Exercise per Session: Not on file  Stress: Stress Concern Present (01/25/2023)   [employer] of Occupational Health - Occupational Stress  Questionnaire    Feeling of Stress : Rather much  Social Connections: Socially Integrated (01/25/2023)   Social Connection and Isolation Panel [NHANES]    Frequency of Communication with Friends and Family: Three times a week    Frequency of Social Gatherings with Friends and Family: Three times a week    Attends Religious Services: More than 4 times per year    Active Member of Clubs or Organizations: Yes    Attends Engineer, Structural: More than 4 times per year    Marital Status: Married    Current Medications:  Current Outpatient Medications:    albuterol  (VENTOLIN  HFA) 108 (90 Base) MCG/ACT inhaler, Inhale 2 puffs into the lungs every 6 (six) hours as needed for wheezing or shortness of breath., Disp: 8 g, Rfl: 0   AMBULATORY NON FORMULARY MEDICATION, Medication Name: CPAP with humidifier with 11 cm water  pressure.  Dx OSA.  See attached Sleep Study.  Please fax to [facility]., Disp: 1 Units, Rfl: 0   atorvastatin  (LIPITOR) 20 MG tablet, TAKE 1 TABLET BY MOUTH AT  BEDTIME, Disp: 100 tablet, Rfl: 2   CALCIUM  PO, Take 1 tablet by mouth daily., Disp: , Rfl:    celecoxib  (CELEBREX ) 200 MG capsule, TAKE 1 TO 2 CAPSULES BY MOUTH  DAILY AS NEEDED FOR PAIN, Disp: 200 capsule, Rfl: 2   chlorpheniramine-HYDROcodone  (TUSSIONEX) 10-8 MG/5ML, Take 5 mLs by mouth every 12 (twelve) hours as needed for cough (cough, will cause drowsiness.)., Disp: 120 mL, Rfl: 0   DULoxetine  (CYMBALTA ) 30 MG capsule, Take 1 capsule (30 mg total) by mouth daily. X 4 weeks,  then every other day x 12 days, Disp: 34 capsule, Rfl: 0   gabapentin  (NEURONTIN ) 300 MG capsule, TAKE 2 CAPSULES BY MOUTH DAILY, Disp: 200 capsule, Rfl: 2   methylPREDNISolone  (MEDROL  DOSEPAK) 4 MG TBPK tablet, Follow instructions per pill pack, Disp: 21 tablet, Rfl: 0   Multiple Vitamins-Minerals (CENTRUM SILVER 50+WOMEN) TABS, Take 1 capsule by mouth daily., Disp: , Rfl:    Multiple Vitamins-Minerals (PRESERVISION AREDS) CAPS, Take 1  capsule by mouth daily., Disp: , Rfl:    polyethylene glycol (MIRALAX / GLYCOLAX) 17 g packet, Take 17 g by mouth daily., Disp: , Rfl:    rOPINIRole  (REQUIP ) 0.5 MG tablet, TAKE 1 TABLET BY MOUTH AT  BEDTIME, Disp: 100 tablet, Rfl: 2   vitamin C (ASCORBIC ACID) 500 MG tablet, Take 500 mg by mouth daily., Disp: , Rfl:   Review of Systems: On ROS intake form: + constipation, +back pain, + fatigue, +urinary incontinence Denies appetite changes, fevers, chills, unexplained weight changes. Denies hearing loss, neck lumps or masses, mouth sores, ringing in ears or voice changes. Denies cough or wheezing. Denies chest pain or palpitations. Denies significant leg swelling. Denies abdominal distention, pain, blood in stools, diarrhea, nausea, vomiting, or early satiety. Denies pain with intercourse, dysuria, frequency, hematuria. Denies hot flashes, pelvic pain, vaginal bleeding or vaginal discharge.   Denies joint pain or muscle pain/cramps. Denies itching, rash, or wounds. Denies dizziness, headaches, numbness or seizures. Denies swollen lymph nodes or glands, denies easy bruising or bleeding. Denies anxiety, depression, confusion, or decreased concentration.  Physical Exam: BP 130/84 (BP Location: Left Arm, Patient Position: Sitting)   Pulse 62   Temp 98.9 F (37.2 C) (Oral)   Resp 18   Ht 5' 4 (1.626 m)   Wt 262 lb 3.2 oz (118.9 kg)   SpO2 99%   BMI 45.01 kg/m  General: Alert, oriented, no acute distress. HEENT: Normocephalic, atraumatic, sclera anicteric. Chest: Clear to auscultation bilaterally.  No wheezes or rhonchi. Cardiovascular: Regular rate and rhythm, no murmurs. Abdomen: Obese, soft, nontender.  Normoactive bowel sounds.  No masses or hepatosplenomegaly appreciated.  Well-healed incisions without masses or nodularity. Extremities: Grossly normal range of motion.  Warm, well perfused.  No significant edema bilaterally. Skin: No rashes or lesions noted. Lymphatics: No  cervical, supraclavicular, or inguinal adenopathy. GU: Normal appearing external genitalia without erythema or excoriation. Speculum exam reveals cuff intact with no bleeding, discharge, or lesions.  Bimanual exam reveals no nodularity or masses.  Rectovaginal exam confirms findings.  Laboratory & Radiologic Studies: 10/2022 Plan for repeat today  CT AP in 10/2022 with no findings concerning for recurrence. CT angio chest 12/04/2022  Assessment & Plan: Patricia Waters is a 68 y.o. 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   The patient is overall doing quite well and is NED on exam today.  We will continue to alternate visits between Dr. Lonn and GYN Oncology. She has a scheduled visit with Dr. Lonn in September 2025. Follow up with Dr. Viktoria has been arranged for April 2025.   Given her fatigue/recent illness, we will plan on checking a CBC and Bmet today. She will be contacted with the results. Bowel regimen discussed. No routine surveillance imaging is recommended unless pt develops symptoms.  Additionally, given her history, no routine pap tests are indicated. Reportable signs and symptoms reviewed. She is advised to call for any needs.   20 minutes  of total time was spent for this patient encounter, including preparation, face-to-face counseling with the patient and coordination of care, and documentation of the encounter.  Eleanor Epps NP [facility] Gynecologic Oncology

## 2023-02-13 NOTE — Telephone Encounter (Signed)
 Patricia Waters is aware of recent lab results as reported by Warner Mccreedy NP.

## 2023-02-16 ENCOUNTER — Telehealth: Payer: Self-pay | Admitting: Oncology

## 2023-02-16 NOTE — Telephone Encounter (Signed)
Called Patricia Waters and advised her of new appointment dates and times for Dr. Pricilla Holm and Dr. Bertis Ruddy.  She verbalized understanding and agreement.

## 2023-03-22 ENCOUNTER — Encounter: Payer: Self-pay | Admitting: Family Medicine

## 2023-04-24 ENCOUNTER — Other Ambulatory Visit: Payer: Self-pay | Admitting: Family Medicine

## 2023-04-24 DIAGNOSIS — Z0000 Encounter for general adult medical examination without abnormal findings: Secondary | ICD-10-CM

## 2023-04-24 MED ORDER — DULOXETINE HCL 60 MG PO CPEP: 60.0000 mg | ORAL_CAPSULE | Freq: Every day | ORAL | 3 refills | Status: DC

## 2023-04-24 NOTE — Progress Notes (Signed)
Meds ordered this encounter  Medications   DULoxetine (CYMBALTA) 60 MG capsule    Sig: Take 1 capsule (60 mg total) by mouth daily.    Dispense:  90 capsule    Refill:  3

## 2023-05-10 ENCOUNTER — Ambulatory Visit: Payer: Medicare Other | Admitting: Gynecologic Oncology

## 2023-06-08 ENCOUNTER — Encounter: Payer: Self-pay | Admitting: Family Medicine

## 2023-06-11 MED ORDER — DULOXETINE HCL 60 MG PO CPEP: 60.0000 mg | ORAL_CAPSULE | Freq: Every day | ORAL | 3 refills | Status: AC

## 2023-06-19 ENCOUNTER — Encounter: Payer: Self-pay | Admitting: Family Medicine

## 2023-06-19 ENCOUNTER — Ambulatory Visit (INDEPENDENT_AMBULATORY_CARE_PROVIDER_SITE_OTHER): Admitting: Family Medicine

## 2023-06-19 VITALS — BP 149/67 | HR 60 | Ht 64.0 in | Wt 258.7 lb

## 2023-06-19 DIAGNOSIS — G4733 Obstructive sleep apnea (adult) (pediatric): Secondary | ICD-10-CM

## 2023-06-19 DIAGNOSIS — E785 Hyperlipidemia, unspecified: Secondary | ICD-10-CM

## 2023-06-19 DIAGNOSIS — R7301 Impaired fasting glucose: Secondary | ICD-10-CM

## 2023-06-19 DIAGNOSIS — M48061 Spinal stenosis, lumbar region without neurogenic claudication: Secondary | ICD-10-CM

## 2023-06-19 DIAGNOSIS — Z6841 Body Mass Index (BMI) 40.0 and over, adult: Secondary | ICD-10-CM

## 2023-06-19 DIAGNOSIS — E66813 Obesity, class 3: Secondary | ICD-10-CM

## 2023-06-19 NOTE — Assessment & Plan Note (Signed)
 A1c is due to be repeated today.

## 2023-06-19 NOTE — Assessment & Plan Note (Signed)
 Refer to healthy weight and wellness I think she would would be a great candidate she did start the program a few years ago and did well initially.  But now that she lives here in [place] in [place] this will be close to home for her.  And she does seem to be more motivated to try to get back on track.

## 2023-06-19 NOTE — Progress Notes (Signed)
 Established Patient Office Visit  Subjective  Patient ID: Patricia Waters, female    DOB: 08-Jul-1955  Age: 68 y.o. MRN: [id number]  Chief Complaint  Patient presents with   Medical Management of Chronic Issues    HPI She is interested in referral back to [facility].but [place]. she is feeling motivated to get back on track. She has lost 4 lbs on her own since I last saw her.  She did also have some questions about possibly utilizing a GLP-1.  Though ultimately she would really like to work on weight loss without medication if possible.  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  So she has been doing a lot of yard work and staying more physically active in fact she is lost about 4 pounds since I last saw her.  They have not opened up the pool yet but usually in the summertime she does do some walking in the pool for exercise as well.  No recent chest pain or shortness of breath.       ROS    Objective:     BP (!) 149/67   Pulse 60   Ht 5\' 4"  (1.626 m)   Wt 258 lb 11.2 oz (117.3 kg)   SpO2 96%   BMI 44.41 kg/m    Physical Exam Vitals and nursing note reviewed.  Constitutional:      Appearance: Normal appearance.  HENT:     Head: Normocephalic and atraumatic.  Eyes:     Conjunctiva/sclera: Conjunctivae normal.  Cardiovascular:     Rate and Rhythm: Normal rate and regular rhythm.  Pulmonary:     Effort: Pulmonary effort is normal.     Breath sounds: Normal breath sounds.  Skin:    General: Skin is warm and dry.  Neurological:     Mental Status: She is alert.  Psychiatric:        Mood and Affect: Mood normal.      No results found for any visits on 06/19/23.    The 10-year ASCVD risk score (Arnett DK, et al., 2019) is: 6.6%    Assessment & Plan:   Problem List Items Addressed This Visit       Respiratory   Sleep apnea, obstructive   CPAP use is consistant.  Using every night for > 4 hours.   Doing well with it. Feels better and sleeps better with it on.  Oertli set to 11 cm of water  pressure.  No problems or concerns with the device.  She cleans it regularly.        Endocrine   IFG (impaired fasting glucose) - Primary   A1c is due to be repeated today.      Relevant Orders   Hemoglobin A1c   CMP14+EGFR   Lipid panel   CBC     Other   SPINAL STENOSIS, LUMBAR   The Cymbalta  really makes a difference in helping manage her pain particularly with her back she is also on gabapentin .      Obesity, Class III, BMI 40-49.9 (morbid obesity)   Refer to healthy weight and wellness I think she would would be a great candidate she did start the program a few years ago and did well initially.  But now that she lives here in [place] [employer] in [place] this will be close to home for her.  And she does seem to be more motivated to try to get back  on track.      Hyperlipidemia LDL goal <100   Due to recheck lipid levels.      Relevant Orders   Hemoglobin A1c   CMP14+EGFR   Lipid panel   CBC   Other Visit Diagnoses       BMI 40.0-44.9, adult (HCC)       Relevant Orders   Amb Ref to Medical Weight Management       Return in about 6 months (around 12/20/2023) for Pre-diabetes.    Duaine German, MD

## 2023-06-19 NOTE — Assessment & Plan Note (Signed)
 The Cymbalta  really makes a difference in helping manage her pain particularly with her back she is also on gabapentin .

## 2023-06-19 NOTE — Assessment & Plan Note (Addendum)
 CPAP use is consistant.  Using every night for > 4 hours.  Doing well with it. Feels better and sleeps better with it on.  Oertli set to 11 cm of water  pressure.  No problems or concerns with the device.  She cleans it regularly.

## 2023-06-19 NOTE — Assessment & Plan Note (Signed)
Due to recheck lipid levels.

## 2023-06-19 NOTE — Patient Instructions (Signed)
 Come back in 2 weeks for Nurse visit for BP check.

## 2023-06-20 ENCOUNTER — Ambulatory Visit: Payer: Self-pay | Admitting: Family Medicine

## 2023-06-20 LAB — CMP14+EGFR
ALT: 24 IU/L (ref 0–32)
AST: 29 IU/L (ref 0–40)
Albumin: 4.5 g/dL (ref 3.9–4.9)
Alkaline Phosphatase: 176 IU/L — ABNORMAL HIGH (ref 44–121)
BUN/Creatinine Ratio: 14 (ref 12–28)
BUN: 11 mg/dL (ref 8–27)
Bilirubin Total: 0.6 mg/dL (ref 0.0–1.2)
CO2: 22 mmol/L (ref 20–29)
Calcium: 10.2 mg/dL (ref 8.7–10.3)
Chloride: 102 mmol/L (ref 96–106)
Creatinine, Ser: 0.77 mg/dL (ref 0.57–1.00)
Globulin, Total: 2.4 g/dL (ref 1.5–4.5)
Glucose: 97 mg/dL (ref 70–99)
Potassium: 4.6 mmol/L (ref 3.5–5.2)
Sodium: 142 mmol/L (ref 134–144)
Total Protein: 6.9 g/dL (ref 6.0–8.5)
eGFR: 85 mL/min/{1.73_m2} (ref >59)

## 2023-06-20 LAB — CBC
Hematocrit: 39.7 % (ref 34.0–46.6)
Hemoglobin: 12.9 g/dL (ref 11.1–15.9)
MCH: 32.1 pg (ref 26.6–33.0)
MCHC: 32.5 g/dL (ref 31.5–35.7)
MCV: 99 fL — ABNORMAL HIGH (ref 79–97)
Platelets: 256 10*3/uL (ref 150–450)
RBC: 4.02 x10E6/uL (ref 3.77–5.28)
RDW: 11.8 % (ref 11.7–15.4)
WBC: 11.5 10*3/uL — ABNORMAL HIGH (ref 3.4–10.8)

## 2023-06-20 LAB — LIPID PANEL
Chol/HDL Ratio: 2.2 ratio (ref 0.0–4.4)
Cholesterol, Total: 125 mg/dL (ref 100–199)
HDL: 56 mg/dL (ref >39)
LDL Chol Calc (NIH): 49 mg/dL (ref 0–99)
Triglycerides: 108 mg/dL (ref 0–149)
VLDL Cholesterol Cal: 20 mg/dL (ref 5–40)

## 2023-06-20 LAB — HEMOGLOBIN A1C
Est. average glucose Bld gHb Est-mCnc: 114 mg/dL
Hgb A1c MFr Bld: 5.6 % (ref 4.8–5.6)

## 2023-06-20 NOTE — Progress Notes (Signed)
 Cindy, alkaline phosphatase which is liver function is elevated.  But your AST and ALT are normal.  Those are also liver enzymes but those look great so I do want a keep an eye on this and plan to recheck it again in 3 to 4 weeks.  I know you have gotten more active so also just encourage you to really work on improving your diet that can also help reduce inflammation in the liver.  Blood count overall looks good no sign of anemia.  The size of the red blood cells came back showing just a little bit larger so work and a call at lab and see if we can add a B12 level.  A1c looks great.  Cholesterol looks good overall.  Call lab and see if we can add a B12 and folate level.

## 2023-06-23 LAB — B12 AND FOLATE PANEL
Folate: >20 ng/mL (ref >3.0)
Vitamin B-12: 539 pg/mL (ref 232–1245)

## 2023-06-23 LAB — SPECIMEN STATUS REPORT

## 2023-06-24 NOTE — Progress Notes (Signed)
 HI Cindy your B12 and folate look great!!

## 2023-06-26 ENCOUNTER — Ambulatory Visit (INDEPENDENT_AMBULATORY_CARE_PROVIDER_SITE_OTHER): Admitting: Sports Medicine

## 2023-06-26 DIAGNOSIS — M19072 Primary osteoarthritis, left ankle and foot: Secondary | ICD-10-CM

## 2023-06-26 MED ORDER — TRAMADOL HCL 50 MG PO TABS
50.0000 mg | ORAL_TABLET | Freq: Three times a day (TID) | ORAL | 0 refills | Status: AC | PRN
Start: 2023-06-26 — End: ?

## 2023-06-26 NOTE — Progress Notes (Signed)
    Procedures performed today:    None.  Independent interpretation of notes and tests performed by another provider:   None.  Brief History, Exam, Impression, and Recommendations:    Primary osteoarthritis of left ankle Persistent of left ankle pain, x-rays in the past did show some osteoarthritis and we did an ankle joint injection back in February, she did okay but is unfortunately having worsening of pain lateral aspect with some mechanical symptoms and popping. She did see Dr. Hulda Mage with orthopedic foot ankle surgery who suggested no additional intervention was needed that she was doing well at the time. He simply suggested continued Celebrex  which is reasonable. Unfortunately having increasing pain and swelling, pain is at the lateral talar dome as well as behind the lateral malleolus, this is concerning for either a peroneus brevis pain generator versus a joint pain generator, as she has only had x-rays and has failed over 6 weeks of physician directed conservative treatment including injections we will proceed with MRI Additional home conditioning given. Tramadol  for pain now and boot for immobilization for now. What we do next will depend on what I see on the MRI.    ____________________________________________ Joselyn Nicely. Sandy Crumb, M.D., ABFM., CAQSM., AME. Primary Care and Sports Medicine [facility]  Adjunct Professor of [facility] Medicine  [employer]  Restaurant manager, fast food

## 2023-06-26 NOTE — Assessment & Plan Note (Signed)
 Persistent of left ankle pain, x-rays in the past did show some osteoarthritis and we did an ankle joint injection back in February, she did okay but is unfortunately having worsening of pain lateral aspect with some mechanical symptoms and popping. She did see Dr. Hulda Mage with orthopedic foot ankle surgery who suggested no additional intervention was needed that she was doing well at the time. He simply suggested continued Celebrex  which is reasonable. Unfortunately having increasing pain and swelling, pain is at the lateral talar dome as well as behind the lateral malleolus, this is concerning for either a peroneus brevis pain generator versus a joint pain generator, as she has only had x-rays and has failed over 6 weeks of physician directed conservative treatment including injections we will proceed with MRI Additional home conditioning given. Tramadol  for pain now and boot for immobilization for now. What we do next will depend on what I see on the MRI.

## 2023-06-30 ENCOUNTER — Ambulatory Visit

## 2023-06-30 DIAGNOSIS — M25572 Pain in left ankle and joints of left foot: Secondary | ICD-10-CM | POA: Diagnosis not present

## 2023-06-30 DIAGNOSIS — M19072 Primary osteoarthritis, left ankle and foot: Secondary | ICD-10-CM

## 2023-06-30 DIAGNOSIS — M722 Plantar fascial fibromatosis: Secondary | ICD-10-CM | POA: Diagnosis not present

## 2023-06-30 DIAGNOSIS — M65972 Unspecified synovitis and tenosynovitis, left ankle and foot: Secondary | ICD-10-CM | POA: Diagnosis not present

## 2023-07-03 ENCOUNTER — Ambulatory Visit (INDEPENDENT_AMBULATORY_CARE_PROVIDER_SITE_OTHER)

## 2023-07-03 VITALS — BP 127/79 | HR 73 | Ht 64.0 in | Wt 258.7 lb

## 2023-07-03 DIAGNOSIS — I10 Essential (primary) hypertension: Secondary | ICD-10-CM | POA: Diagnosis not present

## 2023-07-03 NOTE — Progress Notes (Unsigned)
 Patient is here for blood pressure check.   Previous BP was  149/67  1st BP today: 127/79  Denies chest pain, dizziness, shortness of breath, severe headache, or nosebleeds.  Taking medication as prescribed. Denies missed doses.  Return in about 6 months (around 12/20/2023) for Pre-diabetes.

## 2023-07-06 ENCOUNTER — Telehealth: Payer: Self-pay

## 2023-07-06 ENCOUNTER — Ambulatory Visit: Payer: Self-pay | Admitting: Sports Medicine

## 2023-07-06 NOTE — Telephone Encounter (Signed)
 Copied from CRM [id number]. Topic: Clinical - Lab/Test Results >> Jul 06, 2023 11:17 AM Kevelyn M wrote: Reason for CRM: Patient calling for MRI results. Patient was made aware that we are short radiologist to be able to read the results.

## 2023-07-06 NOTE — Telephone Encounter (Signed)
 Called radiology reading room and had the MRI moved up for reading.

## 2023-07-09 ENCOUNTER — Encounter: Payer: Self-pay | Admitting: Family Medicine

## 2023-07-09 ENCOUNTER — Ambulatory Visit: Payer: Self-pay

## 2023-07-09 ENCOUNTER — Telehealth: Payer: Self-pay

## 2023-07-09 NOTE — Telephone Encounter (Signed)
 Copied from CRM [id number]. Topic: Clinical - Lab/Test Results >> Jul 09, 2023 10:27 AM Blair Bumpers wrote: Reason for CRM: Patient retuning Karla's call in regards to MRI results. Please give patient a call back. CB #: [phone].

## 2023-07-09 NOTE — Telephone Encounter (Signed)
 FYI Only or Action Required?: Action required by provider  Patient was last seen in primary care on 06/26/2023 by Patricia Keels, MD. Called Nurse Triage reporting No chief complaint on file.. Symptoms began today. Interventions attempted: Nothing. Symptoms are: stable.  Triage Disposition: Information or Advice Only Call   Pt would like injections. Please set up. All questions and concerns addressed.      Patient/caregiver understands and will follow disposition?: YesCopied from CRM [id number]. Topic: Clinical - Lab/Test Results >> Jul 09, 2023 12:02 PM Patricia Waters wrote: Reason for CRM: Patient calling back for imaging (MRI) results. Reason for Disposition  Health Information question, no triage required and triager able to answer question  Answer Assessment - Initial Assessment Questions 1. REASON FOR CALL or QUESTION: "What is your reason for calling today?" or "How can I best help you?" or "What question do you have that I can help answer?"     RN read MRI results. Pt would like to set up injections. Please advise.  Protocols used: Information Only Call - No Triage-Waters-AH

## 2023-07-09 NOTE — Telephone Encounter (Signed)
 Copied from CRM [id number]. Topic: Clinical - Order For Equipment >> Jul 09, 2023  9:10 AM Tisa Forester wrote: Reason for CRM: need cpap supplies  [employer] :  [phone]  will be refaxing over the order for Cpap supplies  if can be sign by provider and fax back today at 2:00pm  Carolyn Cisco is a supervisor can get supplies sent out  patient call back number [phone] >> Jul 09, 2023  9:26 AM Bearl Botts A wrote: Carolyn Cisco with [facility] is calling back regarding re sending a urgent fax to the office for CPAP machine supplies for the patient. Per Carolyn Cisco patient is out of supplies and is needing them as soon as possible.  >> Jul 09, 2023  9:15 AM Tisa Forester wrote: Please call patient to let know status

## 2023-07-10 NOTE — Telephone Encounter (Signed)
 Attempted to return phone call but unfortunately was sent to voicemail again, 07/09/2023.  Patient has been reached 07/10/2023 by Christal, CMA, and has been advised of results.

## 2023-07-11 NOTE — Telephone Encounter (Signed)
 Patient has an upcoming appointment with Dr. Sandy Crumb 07/13/2023 @9 :30 am.

## 2023-07-11 NOTE — Telephone Encounter (Signed)
 FYI

## 2023-07-13 ENCOUNTER — Ambulatory Visit (INDEPENDENT_AMBULATORY_CARE_PROVIDER_SITE_OTHER): Admitting: Sports Medicine

## 2023-07-13 ENCOUNTER — Other Ambulatory Visit (INDEPENDENT_AMBULATORY_CARE_PROVIDER_SITE_OTHER)

## 2023-07-13 DIAGNOSIS — M19072 Primary osteoarthritis, left ankle and foot: Secondary | ICD-10-CM | POA: Diagnosis not present

## 2023-07-13 MED ORDER — TRIAMCINOLONE ACETONIDE 40 MG/ML IJ SUSP
40.0000 mg | Freq: Once | INTRAMUSCULAR | Status: AC
Start: 2023-07-13 — End: 2023-07-13
  Administered 2023-07-13: 40 mg via INTRAMUSCULAR

## 2023-07-13 NOTE — Progress Notes (Signed)
    Procedures performed today:    Procedure: Real-time Ultrasound Guided injection of the left ankle joint Device: Samsung HS60  Verbal informed consent obtained.  Time-out conducted.  Noted no overlying erythema, induration, or other signs of local infection.  Skin prepped in a sterile fashion.  Local anesthesia: Topical Ethyl chloride.  With sterile technique and under real time ultrasound guidance: Arthritic joint noted, 1 cc Kenalog  40, 1 cc lidocaine , 1 cc bupivacaine  injected easily Completed without difficulty  Advised to call if fevers/chills, erythema, induration, drainage, or persistent bleeding.  Images permanently stored and available for review in PACS.  Impression: Technically successful ultrasound guided injection.  Independent interpretation of notes and tests performed by another provider:   None.  Brief History, Exam, Impression, and Recommendations:    Primary osteoarthritis of left ankle Patricia Waters returns, she was having chronic ankle pain, she has known osteoarthritis, had an injection back in February that did well, then she started having worsening lateral ankle pain with some popping, she saw Dr. Hulda Mage with foot and ankle surgery who suggested no additional intervention. 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 Return to see me 6 weeks as needed.    ____________________________________________ Joselyn Nicely. Sandy Crumb, M.D., ABFM., CAQSM., AME. Primary Care and Sports Medicine [employer] [facility]  Adjunct Professor of [facility] Medicine  [employer]  Restaurant manager, fast food

## 2023-07-13 NOTE — Telephone Encounter (Signed)
Order was faxed and confirmation received.

## 2023-07-13 NOTE — Assessment & Plan Note (Signed)
 Patricia Waters returns, she was having chronic ankle pain, she has known osteoarthritis, had an injection back in February that did well, then she started having worsening lateral ankle pain with some popping, she saw Dr. Hulda Mage with foot and ankle surgery who suggested no additional intervention. 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 Return to see me 6 weeks as needed.

## 2023-07-13 NOTE — Addendum Note (Signed)
 Addended by: OLIVA-AVELLANEDA, Nedim Oki L on: 07/13/2023 09:51 AM   Modules accepted: Orders

## 2023-07-30 ENCOUNTER — Ambulatory Visit (INDEPENDENT_AMBULATORY_CARE_PROVIDER_SITE_OTHER): Admitting: Family Medicine

## 2023-07-30 ENCOUNTER — Encounter: Payer: Self-pay | Admitting: Family Medicine

## 2023-07-30 VITALS — BP 114/78 | HR 68 | Temp 97.5°F | Ht 64.0 in | Wt 253.0 lb

## 2023-07-30 DIAGNOSIS — Z636 Dependent relative needing care at home: Secondary | ICD-10-CM | POA: Diagnosis not present

## 2023-07-30 DIAGNOSIS — R0602 Shortness of breath: Secondary | ICD-10-CM | POA: Diagnosis not present

## 2023-07-30 DIAGNOSIS — G4731 Primary central sleep apnea: Secondary | ICD-10-CM | POA: Diagnosis not present

## 2023-07-30 DIAGNOSIS — E66813 Obesity, class 3: Secondary | ICD-10-CM

## 2023-07-30 DIAGNOSIS — Z6841 Body Mass Index (BMI) 40.0 and over, adult: Secondary | ICD-10-CM

## 2023-07-30 DIAGNOSIS — Z0289 Encounter for other administrative examinations: Secondary | ICD-10-CM

## 2023-07-30 DIAGNOSIS — R5383 Other fatigue: Secondary | ICD-10-CM

## 2023-07-30 DIAGNOSIS — I10 Essential (primary) hypertension: Secondary | ICD-10-CM

## 2023-07-30 DIAGNOSIS — M48061 Spinal stenosis, lumbar region without neurogenic claudication: Secondary | ICD-10-CM | POA: Diagnosis not present

## 2023-07-30 DIAGNOSIS — Z8542 Personal history of malignant neoplasm of other parts of uterus: Secondary | ICD-10-CM

## 2023-07-30 NOTE — Progress Notes (Signed)
 At a Glance:  Vitals Temp: (!) 97.5 F (36.4 C) BP: 114/78 Pulse Rate: 68 SpO2: 94 %   Anthropometric Measurements Height: 5' 4 (1.626 m) Weight: 253 lb (114.8 kg) BMI (Calculated): 43.41 Starting Weight: 253lb   Body Composition  Body Fat %: 50.4 % Fat Mass (lbs): 127.8 lbs Muscle Mass (lbs): 119.2 lbs Total Body Water  (lbs): 85.4 lbs Visceral Fat Rating : 18   Other Clinical Data RMR: 2189 Fasting: Yes Labs: Yes Today's Visit #: 1 Starting Date: 07/30/23    ZXH:MAAA, HR 73 Unchanged from previous EKG  Indirect Calorimeter completed today shows a VO2 of 318 and a REE of 2189.  Her calculated basal metabolic rate is 8210 thus her basal metabolic rate is better than expected.  Chief Complaint:  Obesity   Subjective:  Patricia Waters (MR# [id number]) is a 68 y.o. female who presents for evaluation and treatment of obesity and related comorbidities.   Patricia Waters is currently in the action stage of change and ready to dedicate time achieving and maintaining a healthier weight. Patricia Waters is interested in becoming our patient and working on intensive lifestyle modifications including (but not limited to) diet and exercise for weight loss.  Patricia Waters has been struggling with her weight. Patricia Waters has been unsuccessful in either losing weight, maintaining weight loss, or reaching her healthy weight goal.  Patricia Waters was seen by Dr. Verdon over 2 years ago with her last visit being August 2023 weighing 229 pounds, initially weighed 250 pounds.  Patricia Waters is slowly regained weight over the past 2 years.  Patricia Waters has not used antiobesity medication in the past.  Patricia Waters's habits were reviewed today and are as follows: Patricia Waters started gaining weight in adulthood, Patricia Waters has significant food cravings issues, Patricia Waters skips meals frequently, Patricia Waters is frequently drinking liquids with calories, and Patricia Waters frequently makes poor food choices.  Patricia Waters lives with her partner and her mom who has had a stroke.  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  Patricia Waters is still physically active most days of the week.  Patricia Waters does enjoy doing some yard work but did note for exercise.  Patricia Waters hates to cook.  Her partner does most of the cooking and Patricia Waters has been snacking more and consuming more sweets.   Other Fatigue Patricia Waters admits to daytime somnolence and admits to waking up still tired. Patient has a history of symptoms of daytime fatigue. Duaa generally gets 8 hours of sleep per night, and states that Patricia Waters has nightime awakenings. Snoring is present. Apneic episodes are present. Epworth Sleepiness Score is 2.  Wearing CPAP nightly  Shortness of Breath Michele notes increasing shortness of breath with exercising and seems to be worsening over time with weight gain. Patricia Waters notes getting out of breath sooner with activity than Patricia Waters used to. This has gotten worse recently. Patricia Waters denies shortness of breath at rest or orthopnea.   Depression Screen Patricia Waters's Food and Mood (modified PHQ-9) score was 8.     12/21/2022   10:28 AM  Depression screen PHQ 2/9  Decreased Interest 0  Down, Depressed, Hopeless 0  PHQ - 2 Score 0     Assessment and Plan:   Other Fatigue Patricia Waters does feel that her weight is causing her energy to be lower than it should be. Fatigue may be related to obesity, depression or many other causes. Labs will be ordered, and in the meanwhile, Patricia Waters will focus on self care including making healthy food choices,  increasing physical activity and focusing on stress reduction.  Shortness of Breath Patricia Waters does feel that Patricia Waters gets out of breath more easily that Patricia Waters used to when Patricia Waters exercises. Patricia Waters's shortness of breath appears to be obesity related and exercise induced. Patricia Waters has agreed to work on weight loss and gradually increase exercise to treat her exercise induced shortness of breath. Will continue to monitor closely.  Patricia Waters had a positive depression screening. Depression  is commonly associated with obesity and often results in emotional eating behaviors. We will monitor this closely and work on CBT to help improve the non-hunger eating patterns. Referral to Psychology may be required if no improvement is seen as Patricia Waters continues in our clinic.    Problem List Items Addressed This Visit     SPINAL STENOSIS, LUMBAR Patricia Waters is able to do some light walking desires lumbar stenosis.  Patricia Waters is on duloxetine  60 daily for pain and gabapentin  300, 2 capsules at bedtime.  Notably, gabapentin  can be weight promoting.  Continue current medications per PCP.  Begin active plan for weight loss.  We discussed exercise alternatives with limitations from back pain.    Central sleep apnea Patricia Waters has known central sleep apnea wearing CPAP nightly.  Patricia Waters has much improved sleep using her CPAP nightly. Consider use of Zepbound given her sleep apnea diagnosis.    Caregiver stress New.  Her mom has moved in with her since her last visit with Dr. Verdon almost 2 years ago.  This has added more stress.  Patricia Waters has good support system at home.  We discussed importance of keeping healthy foods at home despite her mom being a picky eater.   Other Visit Diagnoses       SOBOE (shortness of breath on exertion)    -  Primary     Other fatigue       Relevant Orders   EKG 12-Lead (Completed)   VITAMIN D  25 Hydroxy (Vit-D Deficiency, Fractures)   TSH   T4, free   T3   Insulin , random   Comprehensive metabolic panel with GFR   CBC with Differential/Platelet     Class 3 severe obesity due to excess calories with serious comorbidity and body mass index (BMI) of 40.0 to 44.9 in adult         Hypertension, unspecified type [I10]     Blood pressure is well-controlled currently not on any antihypertensive medications.     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  Begin active plan for weight reduction and reducing body fat percent in order to reduce  future risk for weight related cancers.       Patricia Waters is currently in the action stage of change and her goal is to get back to weightloss efforts . I recommend Patricia Waters begin the structured treatment plan as follows:  Patricia Waters has agreed to Category 3 Plan + 100 additional snack calories  Exercise goals: Older adults should determine their level of effort for physical activity relative to their level of fitness. - We discussed the option of group water  exercise using Silver sneakers or her local pool, outdoor walking versus gym  Behavioral modification strategies:increasing lean protein intake, decreasing simple carbohydrates, increase H2O intake, decrease liquid calories, decreasing eating out, meal planning and cooking strategies, keeping healthy foods in the home, avoiding temptations, planning for success, and decrease junk food   Patricia Waters was informed of the importance of frequent follow-up visits to maximize  her success with intensive lifestyle modifications for her multiple health conditions. Patricia Waters was informed we would discuss her lab results at her next visit unless there is a critical issue that needs to be addressed sooner. Riah agreed to keep her next visit at the agreed upon time to discuss these results.  Objective:  General: Cooperative, alert, well developed, in no acute distress. HEENT: Conjunctivae and lids unremarkable. Cardiovascular: Regular rhythm.  Lungs: Normal work of breathing. Neurologic: No focal deficits.   Lab Results  Component Value Date   CREATININE 0.77 06/19/2023   BUN 11 06/19/2023   NA 142 06/19/2023   K 4.6 06/19/2023   CL 102 06/19/2023   CO2 22 06/19/2023   Lab Results  Component Value Date   ALT 24 06/19/2023   AST 29 06/19/2023   ALKPHOS 176 (H) 06/19/2023   BILITOT 0.6 06/19/2023   Lab Results  Component Value Date   HGBA1C 5.6 06/19/2023   HGBA1C 5.6 12/21/2022   HGBA1C 5.4 06/14/2022   HGBA1C 5.3 12/19/2021   HGBA1C 5.4 05/16/2021    Lab Results  Component Value Date   INSULIN  10.5 11/17/2020   INSULIN  8.1 06/16/2020   INSULIN  10.2 03/24/2020   Lab Results  Component Value Date   TSH 2.770 05/16/2021   Lab Results  Component Value Date   CHOL 125 06/19/2023   HDL 56 06/19/2023   LDLCALC 49 06/19/2023   TRIG 108 06/19/2023   CHOLHDL 2.2 06/19/2023   Lab Results  Component Value Date   WBC 11.5 (H) 06/19/2023   HGB 12.9 06/19/2023   HCT 39.7 06/19/2023   MCV 99 (H) 06/19/2023   PLT 256 06/19/2023   Lab Results  Component Value Date   IRON 85 11/25/2015   TIBC 345 11/25/2015   FERRITIN 89 11/25/2015    Attestation Statements:  Reviewed by clinician on day of visit: allergies, medications, problem list, medical history, surgical history, family history, social history, and previous encounter notes.  Time spent on visit including pre-visit chart review and post-visit charting and face- to face care including nutritional counseling, review of EKG, interpretation of body composition scale and indirect calorimetry and nutrition prescription  was 42 minutes.   Darice Haddock, D.O. DABFM, DABOM [facility] [address] [place], [place] [address] [phone]

## 2023-08-01 ENCOUNTER — Ambulatory Visit: Payer: Self-pay | Admitting: Family Medicine

## 2023-08-01 LAB — COMPREHENSIVE METABOLIC PANEL WITH GFR
ALT: 18 IU/L (ref 0–32)
AST: 25 IU/L (ref 0–40)
Albumin: 4.4 g/dL (ref 3.9–4.9)
Alkaline Phosphatase: 148 IU/L — ABNORMAL HIGH (ref 44–121)
BUN/Creatinine Ratio: 16 (ref 12–28)
BUN: 13 mg/dL (ref 8–27)
Bilirubin Total: 0.6 mg/dL (ref 0.0–1.2)
CO2: 22 mmol/L (ref 20–29)
Calcium: 9.5 mg/dL (ref 8.7–10.3)
Chloride: 104 mmol/L (ref 96–106)
Creatinine, Ser: 0.82 mg/dL (ref 0.57–1.00)
Globulin, Total: 2.3 g/dL (ref 1.5–4.5)
Glucose: 114 mg/dL — ABNORMAL HIGH (ref 70–99)
Potassium: 4.5 mmol/L (ref 3.5–5.2)
Sodium: 143 mmol/L (ref 134–144)
Total Protein: 6.7 g/dL (ref 6.0–8.5)
eGFR: 78 mL/min/{1.73_m2} (ref >59)

## 2023-08-01 LAB — TSH: TSH: 3.41 u[IU]/mL (ref 0.450–4.500)

## 2023-08-01 LAB — CBC WITH DIFFERENTIAL/PLATELET
Basophils Absolute: 0.1 10*3/uL (ref 0.0–0.2)
Basos: 1 %
EOS (ABSOLUTE): 0.2 10*3/uL (ref 0.0–0.4)
Eos: 2 %
Hematocrit: 41.7 % (ref 34.0–46.6)
Hemoglobin: 13.3 g/dL (ref 11.1–15.9)
Immature Grans (Abs): 0 10*3/uL (ref 0.0–0.1)
Immature Granulocytes: 0 %
Lymphocytes Absolute: 1.7 10*3/uL (ref 0.7–3.1)
Lymphs: 22 %
MCH: 31.4 pg (ref 26.6–33.0)
MCHC: 31.9 g/dL (ref 31.5–35.7)
MCV: 99 fL — ABNORMAL HIGH (ref 79–97)
Monocytes Absolute: 0.6 10*3/uL (ref 0.1–0.9)
Monocytes: 7 %
Neutrophils Absolute: 5.4 10*3/uL (ref 1.4–7.0)
Neutrophils: 68 %
Platelets: 237 10*3/uL (ref 150–450)
RBC: 4.23 x10E6/uL (ref 3.77–5.28)
RDW: 12.3 % (ref 11.7–15.4)
WBC: 7.9 10*3/uL (ref 3.4–10.8)

## 2023-08-01 LAB — VITAMIN D 25 HYDROXY (VIT D DEFICIENCY, FRACTURES): Vit D, 25-Hydroxy: 52.2 ng/mL (ref 30.0–100.0)

## 2023-08-01 LAB — T4, FREE: Free T4: 0.92 ng/dL (ref 0.82–1.77)

## 2023-08-01 LAB — INSULIN, RANDOM: INSULIN: 14.2 u[IU]/mL (ref 2.6–24.9)

## 2023-08-01 LAB — T3: T3, Total: 141 ng/dL (ref 71–180)

## 2023-08-07 ENCOUNTER — Ambulatory Visit: Admitting: Sports Medicine

## 2023-08-13 ENCOUNTER — Ambulatory Visit (INDEPENDENT_AMBULATORY_CARE_PROVIDER_SITE_OTHER): Admitting: Family Medicine

## 2023-08-13 ENCOUNTER — Encounter: Payer: Self-pay | Admitting: Family Medicine

## 2023-08-13 VITALS — BP 120/84 | HR 75 | Temp 98.5°F | Ht 64.0 in | Wt 252.0 lb

## 2023-08-13 DIAGNOSIS — Z636 Dependent relative needing care at home: Secondary | ICD-10-CM

## 2023-08-13 DIAGNOSIS — R748 Abnormal levels of other serum enzymes: Secondary | ICD-10-CM

## 2023-08-13 DIAGNOSIS — I10 Essential (primary) hypertension: Secondary | ICD-10-CM | POA: Diagnosis not present

## 2023-08-13 DIAGNOSIS — E66813 Obesity, class 3: Secondary | ICD-10-CM | POA: Diagnosis not present

## 2023-08-13 DIAGNOSIS — Z6841 Body Mass Index (BMI) 40.0 and over, adult: Secondary | ICD-10-CM | POA: Diagnosis not present

## 2023-08-13 NOTE — Patient Instructions (Addendum)
 To make changes to your food plan:  Fruit: 2 serving per day (any fresh or frozen fruits)  Veggies: at dinner, unlimited non starchy veggies  If you wanted to do a meal replacement for lunch: Protein shake or protein bar with `150-200 calories, 20-30 g of  protein, < 8 g of sugar' Buddy up with a fresh fruit serving  Cereal option for breakfast: - [address] [place], Seven Sundays, Premier Protein cereal - Fairlife milk in it   Allow 1/4 plate at dinner for a starch serving - 1/2 sweet potato or 1/2 baked potato - bean -corn, carrots, peas - Barilla protein pasta, chickpea pasta - one serving of rice  - limit pizza, bread, chips

## 2023-08-13 NOTE — Progress Notes (Signed)
 Office: [phone]  /  Fax: [phone]  WEIGHT SUMMARY AND BIOMETRICS  Starting Date: 07/30/23  Starting Weight: 253lb   Weight Lost Since Last Visit: 1lb   Vitals Temp: 98.5 F (36.9 C) BP: 120/84 Pulse Rate: 75 SpO2: 95 %   Body Composition  Body Fat %: 50.3 % Fat Mass (lbs): 127 lbs Muscle Mass (lbs): 119 lbs Total Body Water  (lbs): 86 lbs Visceral Fat Rating : 18    HPI  Chief Complaint: OBESITY  Patricia Waters is here to discuss her progress with her obesity treatment plan. She is on the the Category 3 Plan and states she is following her eating plan approximately 85 % of the time. She states she is exercising 30 minutes 3 times per week.  Interval History:  Since last office visit she is down 1 lb She has been working on getting in all 3 meals, sometimes using a protein bar as meal replacement She struggles to get lunch in as she will be out of the house She is getting in fruits and veggies on place She is able to get in the meat at dinner and feels adequately full She is using her snack calories She drinks unsweet tea, water , occasional coke and beer She has a good support system, though notes a bowl of M&Ms are readily available  Pharmacotherapy: none  PHYSICAL EXAM:  Blood pressure 120/84, pulse 75, temperature 98.5 F (36.9 C), height 5' 4 (1.626 m), weight 252 lb (114.3 kg), SpO2 95%. Body mass index is 43.26 kg/m.  General: She is overweight, cooperative, alert, well developed, and in no acute distress. PSYCH: Has normal mood, affect and thought process.   Lungs: Normal breathing effort, no conversational dyspnea.   ASSESSMENT AND PLAN  TREATMENT PLAN FOR OBESITY:  Recommended Dietary Goals  Patricia Waters is currently in the action stage of change. As such, her goal is to continue weight management plan. She has agreed to the Category 3 Plan. Changes to food plan reviewed on AVS with patient allowing a bit more fiber  Behavioral  Intervention  We discussed the following Behavioral Modification Strategies today: increasing lean protein intake to established goals, increasing fiber rich foods, increasing water  intake , work on meal planning and preparation, keeping healthy foods at home, identifying sources and decreasing liquid calories, work on managing stress, creating time for self-care and relaxation, planning for success, better snacking choices, and continue to work on maintaining a reduced calorie state, getting the recommended amount of protein, incorporating whole foods, making healthy choices, staying well hydrated and practicing mindfulness when eating..  Additional resources provided today: NA  Recommended Physical Activity Goals  Patricia Waters has been advised to work up to 150 minutes of moderate intensity aerobic activity a week and strengthening exercises 2-3 times per week for cardiovascular health, weight loss maintenance and preservation of muscle mass.   She has agreed to Start aerobic activity with a goal of 150 minutes a week at moderate intensity.   Pharmacotherapy changes for the treatment of obesity: none  ASSOCIATED CONDITIONS ADDRESSED TODAY  Elevated serum alkaline phosphatase level Reviewed labs with pt Alk phos mildly elevated on last labs Has gall bladder and denies RUQ pain or post prandial nausea She denies recent bony injury or hx of bone loss No previous hx of NAFLD  Repeat hepatic function panel with GGT in 3 mos Obtain RUQ ultrasound if elevated  Class 3 severe obesity due to excess calories with serious comorbidity and body mass index (BMI) of 40.0  to 44.9 in adult  Hypertension, unspecified type [I10] BP has improved She is off of anti hypertensive medications Continue to limit high sodium foods  Caregiver stress Stable Has caregiver stress with mom at home but has good support system Recommend working on meal planning, eating on a schedule, blocking out time for workouts  and keeping junk food trigger foods out of the house     She was informed of the importance of frequent follow up visits to maximize her success with intensive lifestyle modifications for her multiple health conditions.   ATTESTASTION STATEMENTS:  Reviewed by clinician on day of visit: allergies, medications, problem list, medical history, surgical history, family history, social history, and previous encounter notes pertinent to obesity diagnosis.   I have personally spent 35 minutes total time today in preparation, patient care, nutritional counseling and education,  and documentation for this visit, including the following: review of most recent clinical lab tests,  reviewing medical assistant documentation, review and interpretation of bioimpedence results.     Darice Haddock, D.O. DABFM, DABOM [facility] [address] [place], [place] [address] [phone]

## 2023-08-17 ENCOUNTER — Encounter: Payer: Self-pay | Admitting: Gynecologic Oncology

## 2023-08-17 ENCOUNTER — Inpatient Hospital Stay: Payer: Medicare Other | Attending: Gynecologic Oncology | Admitting: Gynecologic Oncology

## 2023-08-17 VITALS — BP 128/67 | HR 62 | Temp 98.9°F | Resp 20 | Wt 256.2 lb

## 2023-08-17 DIAGNOSIS — Z9221 Personal history of antineoplastic chemotherapy: Secondary | ICD-10-CM | POA: Insufficient documentation

## 2023-08-17 DIAGNOSIS — Z87891 Personal history of nicotine dependence: Secondary | ICD-10-CM | POA: Diagnosis not present

## 2023-08-17 DIAGNOSIS — Z8542 Personal history of malignant neoplasm of other parts of uterus: Secondary | ICD-10-CM | POA: Diagnosis present

## 2023-08-17 DIAGNOSIS — Z90722 Acquired absence of ovaries, bilateral: Secondary | ICD-10-CM | POA: Insufficient documentation

## 2023-08-17 DIAGNOSIS — C541 Malignant neoplasm of endometrium: Secondary | ICD-10-CM

## 2023-08-17 DIAGNOSIS — Z90710 Acquired absence of both cervix and uterus: Secondary | ICD-10-CM | POA: Diagnosis not present

## 2023-08-17 NOTE — Progress Notes (Signed)
 Gynecologic Oncology Return Clinic Visit  08/17/23  Reason for Visit: surveillance  Treatment History: Oncology History Overview Note  MSI Stable Negative genetics   Endometrial cancer (HCC)  07/28/2019 Pathology Results   Endometrium, biopsy - ENDOMETRIOID CARCINOMA - SEE COMMENT Microscopic Comment Based on the biopsy, the carcinoma appears FIGO grade 1.   08/01/2019 Imaging   CT abdomen and pelvis No evidence of abdominal or pelvic metastatic disease.   Small uterine fibroid.   Colonic diverticulosis, without radiographic evidence of diverticulitis.   Aortic Atherosclerosis (ICD10-I70.0).   08/14/2019 Surgery   Surgeon: Eloy Maurilio Fitch  Operation: Robotic-assisted laparoscopic total hysterectomy with bilateral salpingoophorectomy, SLN biopsy     Operative Findings:  : 8cm uterus which appeared grossly normal, normal appearing tubes and ovaries, no suspicious lymph nodes. Brisk oozing from skin incisions.    09/03/2019 Cancer Staging   Staging form: Corpus Uteri - Carcinoma and Carcinosarcoma, AJCC 8th Edition - Pathologic stage from 09/03/2019: FIGO Stage IIIA (pT3a, pN0, cM0) - Signed by Lonn Hicks, MD on 09/03/2019   09/04/2019 Pathology Results   FINAL MICROSCOPIC DIAGNOSIS:   A. LYMPH NODE, SENTINEL RIGHT OBTURATOR PROXIMAL, BIOPSY:  -  No carcinoma identified in one lymph node (0/1)  -  See comment   B. LYMPH NODE, SENTINEL RIGHT OBTURATOR DISTAL, BIOPSY:  -  No carcinoma identified in one lymph node (0/1)  -  See comment   C. LYMPH NODE, SENTINEL LEFT EXTERNAL ILIAC, BIOPSY:  -  No carcinoma identified in one lymph node (0/1)  -  See comment   D. UTERUS AND BILATERAL ADNEXA, HYSTERECTOMY WITH SALPINGO-OOPHORECTOMY:   Uterus:  -  Endometrioid carcinoma, FIGO grade 1  -  Leiomyoma (1.8 cm; largest)  -  See oncology table and comment below   Cervix:  -  Cervix with squamous metaplasia  -  No carcinoma identified   Bilateral Ovaries:  -  Endometrioid  carcinoma involving right ovary  -  No carcinoma involving left ovary  -  Endosalpingiosis   Bilateral Fallopian tubes:  -  No carcinoma identified   ONCOLOGY TABLE:   UTERUS, CARCINOMA OR CARCINOSARCOMA   Procedure: Total hysterectomy and bilateral salpingo-oophorectomy  Histologic type: Endometrioid carcinoma, NOS  Histologic Grade: FIGO grade 1  Myometrial invasion:       Depth of invasion: 3 mm       Myometrial thickness: 20 mm  Uterine Serosa Involvement: Not identified  Cervical stromal involvement: Not identified  Extent of involvement of other organs: Right ovary  Lymphovascular invasion: Not identified  Regional Lymph Nodes:       Examined:      3 Sentinel                               0 non-sentinel                               3 total        Lymph nodes with metastasis: 0        Isolated tumor cells (<0.2 mm): 0        Micrometastasis:  (>0.2 mm and < 2.0 mm): 0        Macrometastasis: (>2.0 mm): 0        Extracapsular extension: N/A  Representative Tumor Block: D10  MMR / MSI testing: Pending  Pathologic Stage Classification (pTNM, AJCC 8th edition):  pT3a, pN0  Comments: Pancytokeratin performed on the lymph nodes is negative. The right ovary has a focus of carcinoma.     09/11/2019 Procedure   Placement of single lumen port a cath via right internal jugular vein. The catheter tip lies at the cavo-atrial junction. A power injectable port a cath was placed and is ready for immediate use.     09/12/2019 - 12/26/2019 Chemotherapy   The patient had carboplatin  and taxol  for chemotherapy treatment.     01/19/2020 Imaging   1. Interval total hysterectomy with bilateral salpingo oophorectomy. No findings to suggest recurrent or metastatic disease. 2. Tiny hiatal hernia. 3. Left colonic diverticulosis without diverticulitis. 4. Aortic Atherosclerosis (ICD10-I70.0).   02/18/2021 Imaging   1. No evidence of metastatic disease in the chest, abdomen or pelvis. No  evidence of a left chest wall mass to correlate with reported palpable abnormality. 2. Marked sigmoid diverticulosis. 3. Small hiatal hernia. 4. Aortic Atherosclerosis (ICD10-I70.0).     11/07/2021 Imaging   1. Status post hysterectomy without evidence of local recurrence or abdominopelvic metastatic disease. 2. Sigmoid colonic diverticulosis without findings of acute diverticulitis. 3. Small hiatal hernia. 4.  Aortic Atherosclerosis (ICD10-I70.0).     02/06/2022 Procedure   Successful right IJ vein Port-A-Cath removal.    10/21/2022 Imaging   CT ABDOMEN PELVIS W CONTRAST  Result Date: 10/21/2022 CLINICAL DATA:  High-risk for ovarian carcinoma. Previous breast and uterine carcinoma. * Tracking Code: BO * EXAM: CT ABDOMEN AND PELVIS WITH CONTRAST TECHNIQUE: Multidetector CT imaging of the abdomen and pelvis was performed using the standard protocol following bolus administration of intravenous contrast. RADIATION DOSE REDUCTION: This exam was performed according to the departmental dose-optimization program which includes automated exposure control, adjustment of the mA and/or kV according to patient size and/or use of iterative reconstruction technique. CONTRAST:  OMNIPAQUE  IOHEXOL  300 MG/ML  SOLN COMPARISON:  11/04/2021 FINDINGS: Lower Chest: No acute findings. Hepatobiliary: No suspicious hepatic masses identified. Gallbladder is unremarkable. No evidence of biliary ductal dilatation. Pancreas:  No mass or inflammatory changes. Spleen: Within normal limits in size and appearance. Adrenals/Urinary Tract: No suspicious masses identified. No evidence of ureteral calculi or hydronephrosis. Stomach/Bowel: No evidence of obstruction, inflammatory process or abnormal fluid collections. Diverticulosis is seen mainly involving the sigmoid colon, however there is no evidence of diverticulitis. Vascular/Lymphatic: No pathologically enlarged lymph nodes. No acute vascular findings. Reproductive: Prior  hysterectomy noted. Adnexal regions are unremarkable in appearance. Other:  None. Musculoskeletal:  No suspicious bone lesions identified. IMPRESSION: No evidence of malignancy or metastatic disease within the abdomen or pelvis. Colonic diverticulosis, without radiographic evidence of diverticulitis. Electronically Signed   By: Norleen DELENA Kil M.D.   On: 10/21/2022 12:51        Interval History: Doing well.  Denies any vaginal bleeding or pelvic pain.  Reports baseline bowel and bladder function.  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     BILATERAL TOTAL MASTECTOMY WITH AXILLARY LYMPH NODE DISSECTION Bilateral 2005   BREAST LUMPECTOMY  1994   right   CERVICAL FUSION  1995   C5-C7   IR IMAGING GUIDED PORT INSERTION  09/11/2019   IR REMOVAL TUN ACCESS W/ PORT W/O FL MOD SED  02/06/2022   MASTECTOMY  2005   Bilat    ROBOTIC ASSISTED TOTAL HYSTERECTOMY WITH BILATERAL SALPINGO OOPHERECTOMY N/A 08/14/2019   Procedure: XI ROBOTIC ASSISTED TOTAL HYSTERECTOMY WITH BILATERAL SALPINGO OOPHORECTOMY;  Surgeon: Eloy Herring, MD;  Location: [facility];  Service: Gynecology;  Laterality: N/A;   SENTINEL NODE BIOPSY N/A 08/14/2019   Procedure: SENTINEL NODE BIOPSY;  Surgeon: Eloy Herring, MD;  Location: [facility];  Service: Gynecology;  Laterality: N/A;   WISDOM TOOTH EXTRACTION Bilateral 2004    Family History  Problem Relation Age of Onset   Stroke Mother    Hyperlipidemia Mother    Skin cancer  Mother    Breast cancer Mother 24       ? recurrence at 104   Endometrial cancer Mother 62   Hypertension Mother    Cancer Mother    Obesity Mother    Diabetes Father    Hyperlipidemia Father    Heart attack Father 80       CABG   Colon cancer Father 52   Prostate cancer Father 15       Gleason 9   Hypertension Father    Heart disease Father    Cancer Father    Sleep apnea Father    Alcoholism Father    Obesity Father    Hyperlipidemia Sister    Breast cancer Sister 41   Heart attack Maternal Grandmother    Skin cancer Maternal Grandmother    Rheum arthritis Maternal Grandmother    Heart attack Maternal Grandfather    Diabetes Paternal Grandmother    Heart attack Paternal Grandmother    Rheum arthritis Paternal Grandmother    Heart attack Paternal Grandfather    Lymphoma Maternal Aunt 30   Kidney cancer Maternal Aunt 60   Cervical cancer Cousin 50    Social History   Socioeconomic History   Marital status: Married    Spouse name: Engineer, drilling   Number of children: 0   Years of education: Not on file   Highest education level: Associate degree: academic program  Occupational History   Occupation: Retired Civil engineer, contracting: [employer]    Comment: [employer]  Tobacco Use   Smoking status: Former    Current packs/day: 0.00    Types: Cigarettes    Quit date: 10/31/2006    Years since quitting: 16.8   Smokeless tobacco: Never  Vaping Use   Vaping status: Never Used  Substance and Sexual Activity   Alcohol use: Yes   Drug use: No   Sexual activity: Not Currently    Partners: Female    Birth control/protection: None  Other Topics Concern   Not on file  Social History Narrative   Lives with her partner, Jonetta. Former Psychologist, forensic.    Social Drivers of Corporate investment banker Strain: Low Risk  (01/25/2023)   Overall Financial Resource Strain (CARDIA)    Difficulty of Paying Living Expenses: Not hard at all  Food Insecurity: No Food  Insecurity (01/25/2023)   Hunger Vital  Sign    Worried About Programme researcher, broadcasting/film/video in the Last Year: Never true    Ran Out of Food in the Last Year: Never true  Transportation Needs: No Transportation Needs (01/25/2023)   PRAPARE - Administrator, Civil Service (Medical): No    Lack of Transportation (Non-Medical): No  Physical Activity: Unknown (01/25/2023)   Exercise Vital Sign    Days of Exercise per Week: Patient declined    Minutes of Exercise per Session: Not on file  Stress: Stress Concern Present (01/25/2023)   [employer] of Occupational Health - Occupational Stress Questionnaire    Feeling of Stress : Rather much  Social Connections: Socially Integrated (01/25/2023)   Social Connection and Isolation Panel    Frequency of Communication with Friends and Family: Three times a week    Frequency of Social Gatherings with Friends and Family: Three times a week    Attends Religious Services: More than 4 times per year    Active Member of Clubs or Organizations: Yes    Attends Engineer, structural: More than 4 times per year    Marital Status: Married    Current Medications:  Current Outpatient Medications:    AMBULATORY NON FORMULARY MEDICATION, Medication Name: CPAP with humidifier with 11 cm water  pressure.  Dx OSA.  See attached Sleep Study.  Please fax to [facility]., Disp: 1 Units, Rfl: 0   atorvastatin  (LIPITOR) 20 MG tablet, TAKE 1 TABLET BY MOUTH AT  BEDTIME, Disp: 100 tablet, Rfl: 2   CALCIUM  PO, Take 1 tablet by mouth daily., Disp: , Rfl:    celecoxib  (CELEBREX ) 200 MG capsule, TAKE 1 TO 2 CAPSULES BY MOUTH  DAILY AS NEEDED FOR PAIN, Disp: 200 capsule, Rfl: 2   DULoxetine  (CYMBALTA ) 60 MG capsule, Take 1 capsule (60 mg total) by mouth daily., Disp: 90 capsule, Rfl: 3   gabapentin  (NEURONTIN ) 300 MG capsule, TAKE 2 CAPSULES BY MOUTH DAILY, Disp: 200 capsule, Rfl: 2   Multiple Vitamins-Minerals (CENTRUM SILVER 50+WOMEN) TABS, Take 1 capsule by  mouth daily., Disp: , Rfl:    Multiple Vitamins-Minerals (PRESERVISION AREDS) CAPS, Take 1 capsule by mouth daily., Disp: , Rfl:    polyethylene glycol (MIRALAX / GLYCOLAX) 17 g packet, Take 17 g by mouth daily., Disp: , Rfl:    rOPINIRole  (REQUIP ) 0.5 MG tablet, TAKE 1 TABLET BY MOUTH AT  BEDTIME, Disp: 100 tablet, Rfl: 2   traMADol  (ULTRAM ) 50 MG tablet, Take 1 tablet (50 mg total) by mouth every 8 (eight) hours as needed for moderate pain (pain score 4-6)., Disp: 21 tablet, Rfl: 0   vitamin C (ASCORBIC ACID) 500 MG tablet, Take 500 mg by mouth daily., Disp: , Rfl:   Review of Systems: + constipation, joint pain, back pain Denies appetite changes, fevers, chills, fatigue, unexplained weight changes. Denies hearing loss, neck lumps or masses, mouth sores, ringing in ears or voice changes. Denies cough or wheezing.  Denies shortness of breath. Denies chest pain or palpitations. Denies leg swelling. Denies abdominal distention, pain, blood in stools, diarrhea, nausea, vomiting, or early satiety. Denies pain with intercourse, dysuria, frequency, hematuria or incontinence. Denies hot flashes, pelvic pain, vaginal bleeding or vaginal discharge.   Denies muscle pain/cramps. Denies itching, rash, or wounds. Denies dizziness, headaches, numbness or seizures. Denies swollen lymph nodes or glands, denies easy bruising or bleeding. Denies anxiety, depression, confusion, or decreased concentration.  Physical Exam: BP 128/67 (BP Location: Left Arm, Patient Position: Sitting)   Pulse  62   Temp 98.9 F (37.2 C) (Oral)   Resp 20   Wt 256 lb 3.2 oz (116.2 kg)   SpO2 98%   BMI 43.98 kg/m  General: Alert, oriented, no acute distress. HEENT: Normocephalic, atraumatic, sclera anicteric. Chest: Clear to auscultation bilaterally.  No wheezes or rhonchi. Cardiovascular: Regular rate and rhythm, no murmurs. Abdomen: Obese, soft, nontender.  Normoactive bowel sounds.  No masses or hepatosplenomegaly  appreciated.  Well-healed incisions. Extremities: Grossly normal range of motion.  Warm, well perfused.  No edema bilaterally. Skin: No rashes or lesions noted. Lymphatics: No cervical, supraclavicular, or inguinal adenopathy. GU: Normal appearing external genitalia without erythema, excoriation.  Small, approximately 1 cm slightly raised and white lesion on the posterior left vulva.  I was able to expel some thick sebaceous contents.  Speculum exam reveals cuff intact, mild atrophy.  Bimanual exam reveals no nodularity or masses.  Rectovaginal exam confirms findings.  Laboratory & Radiologic Studies: None new  Assessment & Plan: Patricia Waters is a 68 y.o. 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   Doing well, NED on exam.  Per NCCN recommendations, we will continue with surveillance visits and exam every 6 months until 5 years after completion of adjuvant treatment, which will be in late 2026.  I will see her in 6 months.  I reviewed signs and symptoms that should prompt a phone call before her next scheduled visit.  20 minutes of total time was spent for this patient encounter, including preparation, face-to-face counseling with the patient and coordination of care, and documentation of the encounter.  Comer Dollar, MD  Division of Gynecologic Oncology  Department of Obstetrics and Gynecology  [facility]

## 2023-08-17 NOTE — Patient Instructions (Addendum)
 It was good to see you today.  I do not see or feel any evidence of cancer recurrence on your exam.  I will see you back in 6 months.  As always, if you develop any new and concerning symptoms before your next visit, please call to see me sooner.

## 2023-08-22 ENCOUNTER — Ambulatory Visit (INDEPENDENT_AMBULATORY_CARE_PROVIDER_SITE_OTHER): Admitting: Sports Medicine

## 2023-08-22 ENCOUNTER — Encounter: Payer: Self-pay | Admitting: Sports Medicine

## 2023-08-22 DIAGNOSIS — M19072 Primary osteoarthritis, left ankle and foot: Secondary | ICD-10-CM | POA: Diagnosis not present

## 2023-08-22 NOTE — Assessment & Plan Note (Signed)
 Patricia Waters returns, she is a very pleasant 69 year old female, chronic ankle pain with known osteoarthritis, she had an injection back in February that did well, we got a consult with Dr. 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 We did other ankle joint injection back in May and she returns today 80% better, she will continue to wear her ASO when out and about. She was given a walking prescription with healthy weight and wellness, I have suggested she do this on a stationary bike with a goal heart rate of 130, for 30 minutes 5 times a week. This will avoid excessive weightbearing, she can return to see me as needed.

## 2023-08-22 NOTE — Progress Notes (Signed)
    Procedures performed today:    None.  Independent interpretation of notes and tests performed by another provider:   None.  Brief History, Exam, Impression, and Recommendations:    Primary osteoarthritis of left ankle Patricia Waters returns, she is a very pleasant 68 year old female, chronic ankle pain with known osteoarthritis, she had an injection back in February that did well, we got a consult with Dr. 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 We did other ankle joint injection back in May and she returns today 80% better, she will continue to wear her ASO when out and about. She was given a walking prescription with healthy weight and wellness, I have suggested she do this on a stationary bike with a goal heart rate of 130, for 30 minutes 5 times a week. This will avoid excessive weightbearing, she can return to see me as needed.    ____________________________________________ Debby PARAS. Curtis, M.D., ABFM., CAQSM., AME. Primary Care and Sports Medicine [facility]  Adjunct Professor of [facility] Medicine  [employer]  Restaurant manager, fast food

## 2023-09-10 ENCOUNTER — Ambulatory Visit (INDEPENDENT_AMBULATORY_CARE_PROVIDER_SITE_OTHER): Admitting: Family Medicine

## 2023-09-10 ENCOUNTER — Encounter: Payer: Self-pay | Admitting: Family Medicine

## 2023-09-10 VITALS — BP 124/78 | HR 68 | Temp 98.3°F | Ht 64.0 in | Wt 247.0 lb

## 2023-09-10 DIAGNOSIS — E66813 Obesity, class 3: Secondary | ICD-10-CM

## 2023-09-10 DIAGNOSIS — Z636 Dependent relative needing care at home: Secondary | ICD-10-CM

## 2023-09-10 DIAGNOSIS — Z6841 Body Mass Index (BMI) 40.0 and over, adult: Secondary | ICD-10-CM

## 2023-09-10 DIAGNOSIS — M19072 Primary osteoarthritis, left ankle and foot: Secondary | ICD-10-CM | POA: Diagnosis not present

## 2023-09-10 DIAGNOSIS — R748 Abnormal levels of other serum enzymes: Secondary | ICD-10-CM

## 2023-09-10 NOTE — Progress Notes (Signed)
 Office: [phone]  /  Fax: [phone]  WEIGHT SUMMARY AND BIOMETRICS  Starting Date: 07/30/23  Starting Weight: 253lb   Weight Lost Since Last Visit: 5lb   Vitals Temp: 98.3 F (36.8 C) BP: 124/78 Pulse Rate: 68 SpO2: 94 %   Body Composition  Body Fat %: 48.3 % Fat Mass (lbs): 119.4 lbs Muscle Mass (lbs): 121.4 lbs Total Body Water (lbs): 84.4 lbs Visceral Fat Rating : 17    HPI  Chief Complaint: OBESITY  Patricia Waters is here to discuss her progress with her obesity treatment plan. She is on the the Category 3 Plan and states she is following her eating plan approximately 80 % of the time. She states she is doing house work and yard work.    Interval History:  Since last office visit she is down 5 lb Taking Celebrex 2 x a day for arthritis pain She has done some yardwork and a little walking but has not been biking She is doing well on her food plain She has some additional hunger She is snacking on peanuts, Tzaki and cracker and [employer] She keep M&Ms around for her mom that do tempt her She is working on stress reduction but has caregiver stress   Pharmacotherapy: none  PHYSICAL EXAM:  Blood pressure 124/78, pulse 68, temperature 98.3 F (36.8 C), height 5' 4 (1.626 m), weight 247 lb (112 kg), SpO2 94%. Body mass index is 42.4 kg/m.  General: She is overweight, cooperative, alert, well developed, and in no acute distress. PSYCH: Has normal mood, affect and thought process.   Lungs: Normal breathing effort, no conversational dyspnea.   ASSESSMENT AND PLAN  TREATMENT PLAN FOR OBESITY:  Recommended Dietary Goals  Ruchy is currently in the action stage of change. As such, her goal is to continue weight management plan. She has agreed to the Category 3 Plan and keeping a food journal and adhering to recommended goals of 1600 calories and 100 g of  protein.  Behavioral Intervention  We discussed the following Behavioral Modification  Strategies today: increasing lean protein intake to established goals, increasing fiber rich foods, increasing water intake , work on meal planning and preparation, work on tracking and journaling calories using tracking application, keeping healthy foods at home, identifying sources and decreasing liquid calories, decreasing eating out or consumption of processed foods, and making healthy choices when eating convenient foods, practice mindfulness eating and understand the difference between hunger signals and cravings, and continue to work on maintaining a reduced calorie state, getting the recommended amount of protein, incorporating whole foods, making healthy choices, staying well hydrated and practicing mindfulness when eating..  Additional resources provided today: NA  Recommended Physical Activity Goals  Synda has been advised to work up to 150 minutes of moderate intensity aerobic activity a week and strengthening exercises 2-3 times per week for cardiovascular health, weight loss maintenance and preservation of muscle mass.   She has agreed to [employer] strengthening exercises with a goal of 2-3 sessions a week  and Start aerobic activity with a goal of 150 minutes a week at moderate intensity.  Begin exercise bike 3 x a week + body weight training or free weights 2 x a week  Pharmacotherapy changes for the treatment of obesity: none  ASSOCIATED CONDITIONS ADDRESSED TODAY  Class 3 severe obesity due to excess calories with serious comorbidity and body mass index (BMI) of 40.0 to 44.9 in adult Improving Reviewed results of bioimpedence today She is losing weight on a  reduced kcal diet She has room for improvement with keeping junk food out of sight and improving physical activity   Primary osteoarthritis of left ankle Stable Wearing L ASO brace and taking Celebrex 2 x day for pain Limits walking due to pain May had corticosteroid injection with Dr T with some improvement in pain Notes  from Dr T reviewed from 7/23 but she has not started the exercise bike as recommended  Elevated serum alkaline phosphatase level Repeat Hepatic function panel with GGT in 2 mox Continue to limit Tylenol and ETOH intake  Caregiver stress Unchanged w/ her mom     She was informed of the importance of frequent follow up visits to maximize her success with intensive lifestyle modifications for her multiple health conditions.   ATTESTASTION STATEMENTS:  Reviewed by clinician on day of visit: allergies, medications, problem list, medical history, surgical history, family history, social history, and previous encounter notes pertinent to obesity diagnosis.   I have personally spent 22 minutes total time today in preparation, patient care, nutritional counseling and education,  and documentation for this visit, including the following: review of most recent clinical lab tests,  reviewing medical assistant documentation, review and interpretation of bioimpedence results.     Darice Haddock, D.O. DABFM, DABOM [facility] [address] [place], [place] [address] [phone]

## 2023-09-17 DIAGNOSIS — L820 Inflamed seborrheic keratosis: Secondary | ICD-10-CM | POA: Diagnosis not present

## 2023-09-17 DIAGNOSIS — D225 Melanocytic nevi of trunk: Secondary | ICD-10-CM | POA: Diagnosis not present

## 2023-09-17 DIAGNOSIS — L821 Other seborrheic keratosis: Secondary | ICD-10-CM | POA: Diagnosis not present

## 2023-09-17 DIAGNOSIS — D1801 Hemangioma of skin and subcutaneous tissue: Secondary | ICD-10-CM | POA: Diagnosis not present

## 2023-09-17 DIAGNOSIS — Z129 Encounter for screening for malignant neoplasm, site unspecified: Secondary | ICD-10-CM | POA: Diagnosis not present

## 2023-09-17 DIAGNOSIS — L578 Other skin changes due to chronic exposure to nonionizing radiation: Secondary | ICD-10-CM | POA: Diagnosis not present

## 2023-09-17 DIAGNOSIS — Z85828 Personal history of other malignant neoplasm of skin: Secondary | ICD-10-CM | POA: Diagnosis not present

## 2023-09-17 DIAGNOSIS — W908XXS Exposure to other nonionizing radiation, sequela: Secondary | ICD-10-CM | POA: Diagnosis not present

## 2023-10-02 ENCOUNTER — Encounter: Payer: Self-pay | Admitting: Sports Medicine

## 2023-10-08 ENCOUNTER — Ambulatory Visit (INDEPENDENT_AMBULATORY_CARE_PROVIDER_SITE_OTHER): Admitting: Family Medicine

## 2023-10-08 ENCOUNTER — Encounter: Payer: Self-pay | Admitting: Family Medicine

## 2023-10-08 VITALS — BP 102/67 | HR 65 | Temp 98.0°F | Ht 64.0 in | Wt 243.0 lb

## 2023-10-08 DIAGNOSIS — Z636 Dependent relative needing care at home: Secondary | ICD-10-CM

## 2023-10-08 DIAGNOSIS — E66813 Obesity, class 3: Secondary | ICD-10-CM | POA: Diagnosis not present

## 2023-10-08 DIAGNOSIS — Z6841 Body Mass Index (BMI) 40.0 and over, adult: Secondary | ICD-10-CM

## 2023-10-08 DIAGNOSIS — R748 Abnormal levels of other serum enzymes: Secondary | ICD-10-CM

## 2023-10-08 DIAGNOSIS — M19072 Primary osteoarthritis, left ankle and foot: Secondary | ICD-10-CM

## 2023-10-08 NOTE — Progress Notes (Signed)
 Office: [phone]  /  Fax: [phone]  WEIGHT SUMMARY AND BIOMETRICS  Starting Date: 07/30/23  Starting Weight: 253lb   Weight Lost Since Last Visit: 4lb   Vitals Temp: 98 F (36.7 C) BP: 102/67 Pulse Rate: 65 SpO2: 97 %   Body Composition  Body Fat %: 48.9 % Fat Mass (lbs): 119.2 lbs Muscle Mass (lbs): 118.4 lbs Total Body Water  (lbs): 84.2 lbs Visceral Fat Rating : 17   HPI  Chief Complaint: OBESITY  Patricia Waters is here to discuss her progress with her obesity treatment plan. She is on the the Category 3 Plan and states she is following her eating plan approximately 75 % of the time. She states she is exercising 30+ minutes 2-3 times per week.  Interval History:  Since last office visit she is down 4 lb She is down 3 lb of muscle mass and down 0.2 lb of body fat since last visit This gives her a net weight loss of 10 lb in 2 mos of medically supervised weight management This is a 3.9% TBW loss since restarting our program in late June She has been more physically active She has some arthritis pain - still having L ankle pain- on Celebrex  daily She denies meal skipping  She has declined need for AOM She continues to have caregiver stress w/ her mom and limited time for self care and workouts She has not started to use her exercise bike at home  Pharmacotherapy: none  PHYSICAL EXAM:  Blood pressure 102/67, pulse 65, temperature 98 F (36.7 C), height 5' 4 (1.626 m), weight 243 lb (110.2 kg), SpO2 97%. Body mass index is 41.71 kg/m.  General: She is overweight, cooperative, alert, well developed, and in no acute distress. PSYCH: Has normal mood, affect and thought process.   Lungs: Normal breathing effort, no conversational dyspnea.   ASSESSMENT AND PLAN  TREATMENT PLAN FOR OBESITY:  Recommended Dietary Goals  [name] is currently in the action stage of change. As such, her goal is to continue weight management plan. She has agreed to the Category  3 Plan and keeping a food journal and adhering to recommended goals of 1600 calories and 100 g of protein. Reviewed daily protein goal, begin logging to ensure she is hitting goal given recent muscle loss  Behavioral Intervention  We discussed the following Behavioral Modification Strategies today: increasing lean protein intake to established goals, increasing fiber rich foods, increasing water  intake , work on meal planning and preparation, work on tracking and journaling calories using tracking application, keeping healthy foods at home, work on managing stress, creating time for self-care and relaxation, continue to practice mindfulness when eating, and planning for success.  Additional resources provided today: NA  Recommended Physical Activity Goals  Jarica has been advised to work up to 150 minutes of moderate intensity aerobic activity a week and strengthening exercises 2-3 times per week for cardiovascular health, weight loss maintenance and preservation of muscle mass.   She has agreed to Start aerobic activity with a goal of 150 minutes a week at moderate intensity.  We set a goal for 15 min on exercise bike 5 days/ wk  Pharmacotherapy changes for the treatment of obesity: none  ASSOCIATED CONDITIONS ADDRESSED TODAY  Caregiver stress Stable She has had to care for her mom more lately due to illness The stress of caring for her mom and the time limitations have been barriers to her weight loss progress. We discussed adding in smaller increments of home exercise  Limit junk food at home and continue to work on self care  Class 3 severe obesity due to excess calories with serious comorbidity and body mass index (BMI) of 40.0 to 44.9 in adult Improving She has declined need for AOM   Primary osteoarthritis of left ankle L ankle OA is stable, taking Celebrex  200 mg most days of the week Walking limited due to L ankle pain, previously injected by Dr T With Dr T leaving, we  discussed additional [facility] doctors as options She agrees to 15 min on the exercise bike 5 days/ wk  Elevated serum alkaline phosphatase level She will be due for a repeat hepatic function panel with GGT next visit Lab Results  Component Value Date   ALKPHOS 148 (H) 07/30/2023   ALKPHOS 134 01/20/2014   She denies an RUQ or post prandial GI upset     She was informed of the importance of frequent follow up visits to maximize her success with intensive lifestyle modifications for her multiple health conditions.   ATTESTASTION STATEMENTS:  Reviewed by clinician on day of visit: allergies, medications, problem list, medical history, surgical history, family history, social history, and previous encounter notes pertinent to obesity diagnosis.   I have personally spent 30 minutes total time today in preparation, patient care, nutritional counseling and education,  and documentation for this visit, including the following: review of most recent clinical lab tests, reviewing medical assistant documentation, review and interpretation of bioimpedence results.     Darice Haddock, D.O. DABFM, DABOM [facility] [address] [place], [place] [address] [phone]

## 2023-10-08 NOTE — Patient Instructions (Signed)
 To help you reach your target goal of 100 g of protein daily,  Add greek yogurt (with < 8 g of sugar) Add liquid egg whites to your 2 eggs to bump up protein content Add a serving of cottage cheese as a snack Supplement with a protein shake like [employer] Power, FairLife shake or Premier Protein  Remember your fruits and veggies Aim for 15 min on the exercise bike 5 days/ wk

## 2023-10-25 ENCOUNTER — Other Ambulatory Visit: Payer: Self-pay | Admitting: Family Medicine

## 2023-10-25 DIAGNOSIS — M19072 Primary osteoarthritis, left ankle and foot: Secondary | ICD-10-CM

## 2023-10-30 ENCOUNTER — Other Ambulatory Visit: Payer: Medicare Other

## 2023-10-30 ENCOUNTER — Ambulatory Visit: Payer: Medicare Other | Admitting: Hematology and Oncology

## 2023-11-04 ENCOUNTER — Encounter: Payer: Self-pay | Admitting: Family Medicine

## 2023-11-14 ENCOUNTER — Ambulatory Visit (INDEPENDENT_AMBULATORY_CARE_PROVIDER_SITE_OTHER): Admitting: Family Medicine

## 2023-11-14 ENCOUNTER — Encounter: Payer: Self-pay | Admitting: Family Medicine

## 2023-11-14 VITALS — BP 125/82 | HR 64 | Temp 98.2°F | Ht 64.0 in | Wt 242.0 lb

## 2023-11-14 DIAGNOSIS — M19072 Primary osteoarthritis, left ankle and foot: Secondary | ICD-10-CM | POA: Diagnosis not present

## 2023-11-14 DIAGNOSIS — Z636 Dependent relative needing care at home: Secondary | ICD-10-CM

## 2023-11-14 DIAGNOSIS — Z6841 Body Mass Index (BMI) 40.0 and over, adult: Secondary | ICD-10-CM

## 2023-11-14 DIAGNOSIS — E66813 Obesity, class 3: Secondary | ICD-10-CM

## 2023-11-14 DIAGNOSIS — R748 Abnormal levels of other serum enzymes: Secondary | ICD-10-CM

## 2023-11-14 NOTE — Progress Notes (Signed)
 Office: [phone]  /  Fax: [phone]  WEIGHT SUMMARY AND BIOMETRICS  Starting Date: 07/30/23  Starting Weight: 253lb   Weight Lost Since Last Visit: 1lb   Vitals Temp: 98.2 F (36.8 C) BP: 125/82 Pulse Rate: 64 SpO2: 96 %   Body Composition  Body Fat %: 48.3 % Fat Mass (lbs): 117.2 lbs Muscle Mass (lbs): 119.2 lbs Total Body Water  (lbs): 84.8 lbs Visceral Fat Rating : 17     HPI  Chief Complaint: OBESITY  Patricia Waters is here to discuss her progress with her obesity treatment plan. She is on the the Category 3 Plan and states she is following her eating plan approximately 75 % of the time. She states she is doing yard work and house work.    Interval History:  Since last office visit she is down 1 lb She is up 0.8 lb of muscle mass and down 2 lb of body fat since last visit This gives her a net weight loss of 11 lb in 3 mos of medically supervised weight management This is a 4.3% TBW loss She did go to the beach since last visit She is getting tired of her meal plan, eating less on plan lately She is getting in all 3 meals most day with less fruits and veggies She is eating more starches and sweets She has not been doing any exercise  Pharmacotherapy: none  PHYSICAL EXAM:  Blood pressure 125/82, pulse 64, temperature 98.2 F (36.8 C), height 5' 4 (1.626 m), weight 242 lb (109.8 kg), SpO2 96%. Body mass index is 41.54 kg/m.  General: She is overweight, cooperative, alert, well developed, and in no acute distress. PSYCH: Has normal mood, affect and thought process.   Lungs: Normal breathing effort, no conversational dyspnea.   ASSESSMENT AND PLAN  TREATMENT PLAN FOR OBESITY:  Recommended Dietary Goals  [name] is currently in the action stage of change. As such, her goal is to continue weight management plan. She has agreed to the Category 3 Plan. Given the option to use the Chat GPT app to create a new meal plan Given her REE of 2189, moved  her to 1700 calorie healthy meal plan using AI She may swap out meals and snacks as shown today, but keep the calories constant Continue to focus on lean protein and fiber with meals and snacks  Behavioral Intervention  We discussed the following Behavioral Modification Strategies today: increasing lean protein intake to established goals, increasing fiber rich foods, reading food labels , keeping healthy foods at home, avoiding temptations and identifying enticing environmental cues, planning for success, better snacking choices, getting back on track after recent relapse, and avoid all or none thinking.  Additional resources provided today: NA  Recommended Physical Activity Goals  Patricia Waters has been advised to work up to 150 minutes of moderate intensity aerobic activity a week and strengthening exercises 2-3 times per week for cardiovascular health, weight loss maintenance and preservation of muscle mass.   She has agreed to Patient will begin to exercise she agrees to 10-15 min on exercise bike 3 days/ wk  Pharmacotherapy changes for the treatment of obesity: none  ASSOCIATED CONDITIONS ADDRESSED TODAY  Elevated alkaline phosphatase level Lab Results  Component Value Date   ALKPHOS 148 (H) 07/30/2023   ALKPHOS 134 01/20/2014   Fibrosis 4 Score = 1.67  Fib-4 interpretation is not validated for people under 35 or over 64 years of age. However, scores under 2.0 are generally considered low risk.  -  Hepatic function panel -     Gamma GT Repeat labs today Denies RUQ pain or post prandial N/V No previous hx of NAFLD Continue active plan for weight loss, limiting ETOH intake  Class 3 severe obesity due to excess calories with serious comorbidity and body mass index (BMI) of 40.0 to 44.9 in adult [facility]) Reviewed patient's overall progress She did not meet 5% TBW loss goal in 12 weeks with reduced kcal diet and exercise plan alone, with some non adherence She is still motivated to  work on behavioral changes She has not been interested in AOMs Reviewed goals on AVS  Primary osteoarthritis of left ankle Unchanged Wearing a L ankle brace and able to do ADLs Has not started on exercise bike Goals reviewed  Caregiver stress Unchanged Caregiver stress and time constraints have affected her success with obesity management  Reviewed her readiness for change today If not seeing 5+ lb of weight loss by end of year, may move her visits to RD/ trainer      She was informed of the importance of frequent follow up visits to maximize her success with intensive lifestyle modifications for her multiple health conditions.   ATTESTASTION STATEMENTS:  Reviewed by clinician on day of visit: allergies, medications, problem list, medical history, surgical history, family history, social history, and previous encounter notes pertinent to obesity diagnosis.   I have personally spent 30 minutes total time today in preparation, patient care, nutritional counseling and education,  and documentation for this visit, including the following: review of most recent clinical lab tests,  reviewing medical assistant documentation, review and interpretation of bioimpedence results.     Patricia Waters, D.O. DABFM, DABOM [facility] [address] [place], [place] [address] [phone]

## 2023-11-14 NOTE — Patient Instructions (Addendum)
 Check out Chat GPT to create a new meal plan: 1700 calorie healthy meal plan  You can swap out meals/ snacks but keep the calories the same  Labs today Will send results to MyChart tomorrow  Aim for 10-15 min on the exercise bike 3 days/ wk  Net: weight loss of 11 lb in 3 mos

## 2023-11-15 ENCOUNTER — Other Ambulatory Visit: Payer: Self-pay | Admitting: Family Medicine

## 2023-11-15 ENCOUNTER — Ambulatory Visit: Payer: Self-pay | Admitting: Family Medicine

## 2023-11-15 DIAGNOSIS — R748 Abnormal levels of other serum enzymes: Secondary | ICD-10-CM

## 2023-11-15 LAB — HEPATIC FUNCTION PANEL
ALT: 18 IU/L (ref 0–32)
AST: 24 IU/L (ref 0–40)
Albumin: 4.2 g/dL (ref 3.9–4.9)
Alkaline Phosphatase: 162 IU/L — ABNORMAL HIGH (ref 49–135)
Bilirubin Total: 0.4 mg/dL (ref 0.0–1.2)
Bilirubin, Direct: 0.15 mg/dL (ref 0.00–0.40)
Total Protein: 6.6 g/dL (ref 6.0–8.5)

## 2023-11-15 LAB — GAMMA GT: GGT: 10 IU/L (ref 0–60)

## 2023-11-15 NOTE — Progress Notes (Signed)
 Elevted alk phos.  Will check isoezymes.

## 2023-11-16 DIAGNOSIS — R748 Abnormal levels of other serum enzymes: Secondary | ICD-10-CM | POA: Diagnosis not present

## 2023-11-21 ENCOUNTER — Ambulatory Visit: Payer: Self-pay | Admitting: Family Medicine

## 2023-11-21 LAB — ALKALINE PHOSPHATASE, ISOENZYMES
Alkaline Phosphatase: 170 IU/L — ABNORMAL HIGH (ref 49–135)
BONE FRACTION: 33 % (ref 14–68)
INTESTINAL FRAC.: 1 % (ref 0–18)
LIVER FRACTION: 66 % (ref 18–85)

## 2023-11-21 NOTE — Progress Notes (Signed)
 Hi Patricia Waters, alkaline phosphatase came back and the level is still similarly elevated to where it has been over the last 5 months right over 170.  Interestingly the breakdown for liver bone and intestine are all within normal range.  That actually is  reassuring.  I do think Dr. Johnella recommendation of getting the liver test to check for the stiffness is a good next step.  I am not sure if they have already contacted you to schedule.  But I would proceed with that.

## 2023-12-18 ENCOUNTER — Encounter: Payer: Self-pay | Admitting: Family Medicine

## 2023-12-18 ENCOUNTER — Ambulatory Visit: Admitting: Family Medicine

## 2023-12-18 VITALS — BP 126/83 | HR 71 | Temp 98.0°F | Ht 64.0 in | Wt 241.0 lb

## 2023-12-18 DIAGNOSIS — E66813 Obesity, class 3: Secondary | ICD-10-CM

## 2023-12-18 DIAGNOSIS — R748 Abnormal levels of other serum enzymes: Secondary | ICD-10-CM | POA: Diagnosis not present

## 2023-12-18 DIAGNOSIS — E639 Nutritional deficiency, unspecified: Secondary | ICD-10-CM | POA: Diagnosis not present

## 2023-12-18 DIAGNOSIS — Z636 Dependent relative needing care at home: Secondary | ICD-10-CM

## 2023-12-18 DIAGNOSIS — Z6841 Body Mass Index (BMI) 40.0 and over, adult: Secondary | ICD-10-CM

## 2023-12-18 DIAGNOSIS — Z9189 Other specified personal risk factors, not elsewhere classified: Secondary | ICD-10-CM

## 2023-12-18 NOTE — Progress Notes (Signed)
 Office: [phone]  /  Fax: [phone]  WEIGHT SUMMARY AND BIOMETRICS  Starting Date: 07/30/23  Starting Weight: 253lb   Weight Lost Since Last Visit: 1lb   Vitals Temp: 98 F (36.7 C) BP: 126/83 Pulse Rate: 71 SpO2: 94 %   Body Composition  Body Fat %: 48.5 % Fat Mass (lbs): 116.8 lbs Muscle Mass (lbs): 118 lbs Total Body Water  (lbs): 84.8 lbs Visceral Fat Rating : 17    HPI  Chief Complaint: OBESITY  Patricia Waters is here to discuss her progress with her obesity treatment plan. She is on the the Category 3 Plan and states she is following her eating plan approximately 50 % of the time. She states she is doing yard work.   Interval History:  Since last office visit she is down 1 lb She is down 1.2 pounds of muscle mass and down 0.4 pounds of body fat since last visit This gives her net weight loss of 12 pounds in the past 4 months medically supervised weight management That is a 4.3% total body weight loss She never tried the AI meal plan for more variety after voicing being tired of cat 3 meal plan at last visit She has traveled and had celebrations since last visit She is scheduled to see [facility] for elevated alk phos She will be traveling to [place]  in Dec to see family She has not been doing any regular exercise  Pharmacotherapy: None  PHYSICAL EXAM:  Blood pressure 126/83, pulse 71, temperature 98 F (36.7 C), height 5' 4 (1.626 m), weight 241 lb (109.3 kg), SpO2 94%. Body mass index is 41.37 kg/m.  General: She is overweight, cooperative, alert, well developed, and in no acute distress. PSYCH: Has normal mood, affect and thought process.   Lungs: Normal breathing effort, no conversational dyspnea.   ASSESSMENT AND PLAN  TREATMENT PLAN FOR OBESITY:  Recommended Dietary Goals  Patricia Waters is currently in the action stage of change. As such, her goal is to continue weight management plan. She has agreed to keeping a food journal and adhering to  recommended goals of 1600 calories and 90 g of protein.  Behavioral Intervention  We discussed the following Behavioral Modification Strategies today: increasing lean protein intake to established goals, increasing fiber rich foods, increasing water  intake , work on tracking and journaling calories using tracking application, keeping healthy foods at home, decreasing eating out or consumption of processed foods, and making healthy choices when eating convenient foods, practice mindfulness eating and understand the difference between hunger signals and cravings, work on managing stress, creating time for self-care and relaxation, avoiding temptations and identifying enticing environmental cues, continue to practice mindfulness when eating, and planning for success.  Additional resources provided today: NA  Recommended Physical Activity Goals  Patricia Waters has been advised to work up to 150 minutes of moderate intensity aerobic activity a week and strengthening exercises 2-3 times per week for cardiovascular health, weight loss maintenance and preservation of muscle mass.   She has agreed to Start aerobic activity with a goal of 150 minutes a week at moderate intensity.  We discussed the importance of either outdoor walking or carving out time for the gym with a goal of at least 30 minutes 5 days a week  Pharmacotherapy changes for the treatment of obesity: None  ASSOCIATED CONDITIONS ADDRESSED TODAY  Elevated serum alkaline phosphatase level Lab Results  Component Value Date   ALKPHOS 170 (H) 11/16/2023   ALKPHOS 134 01/20/2014   Reviewed lab findings  from last visit.  Her alk phos remains elevated, repeated by her PCP, unchanged.  She has been referred back to [facility] for further evaluation and treatment.  She is at risk for NASH with visceral adiposity and a BMI over 40.  Continue active plan for weight reduction.  She does deny any right upper quadrant pain or postprandial nausea or vomiting.   GGT was within normal limits  Class 3 severe obesity due to excess calories with serious comorbidity and body mass index (BMI) of 40.0 to 44.9 in adult [facility]) Weight loss has slowed down.  She has room for improvement with compliance with dietary tracking and regular exercise We discussed adding in a referral to the prep program and/or seeing a registered dietitian.  She declined both today.  Caregiver stress Unchanged.  She still has caregiver stress related to her mom.  We discussed the importance of self-care and carving out time for regular exercise and healthy meals.  Has poorly balanced diet Unchanged.  She has struggled more recently with poor food choices.  Recommend writing down personal goals as she approaches the new year.  Counseled on meal planning, grocery shopping and keeping healthy foods on hand especially with upcoming travel.     She was informed of the importance of frequent follow up visits to maximize her success with intensive lifestyle modifications for her multiple health conditions.   ATTESTASTION STATEMENTS:  Reviewed by clinician on day of visit: allergies, medications, problem list, medical history, surgical history, family history, social history, and previous encounter notes pertinent to obesity diagnosis.   I have personally spent 30 minutes total time today in preparation, patient care, nutritional counseling and education,  and documentation for this visit, including the following: review of most recent clinical lab tests,  reviewing medical assistant documentation, review and interpretation of bioimpedence results.     Patricia Waters, D.O. DABFM, DABOM [facility] [address] [place], [place] [address] [phone]

## 2023-12-20 ENCOUNTER — Ambulatory Visit (INDEPENDENT_AMBULATORY_CARE_PROVIDER_SITE_OTHER): Admitting: Family Medicine

## 2023-12-20 ENCOUNTER — Encounter: Payer: Self-pay | Admitting: Family Medicine

## 2023-12-20 VITALS — BP 120/78 | HR 72 | Ht 64.0 in | Wt 241.0 lb

## 2023-12-20 DIAGNOSIS — M19072 Primary osteoarthritis, left ankle and foot: Secondary | ICD-10-CM | POA: Diagnosis not present

## 2023-12-20 DIAGNOSIS — E66813 Obesity, class 3: Secondary | ICD-10-CM

## 2023-12-20 DIAGNOSIS — I10 Essential (primary) hypertension: Secondary | ICD-10-CM | POA: Diagnosis not present

## 2023-12-20 DIAGNOSIS — R7301 Impaired fasting glucose: Secondary | ICD-10-CM | POA: Diagnosis not present

## 2023-12-20 LAB — POCT GLYCOSYLATED HEMOGLOBIN (HGB A1C): Hemoglobin A1C: 5.4 % (ref 4.0–5.6)

## 2023-12-20 MED ORDER — TRAMADOL HCL 50 MG PO TABS: 50.0000 mg | ORAL_TABLET | Freq: Three times a day (TID) | ORAL | 0 refills | Status: AC | PRN

## 2023-12-20 NOTE — Patient Instructions (Signed)
 Please schedule your medicare wellness exam telephone visit.

## 2023-12-20 NOTE — Assessment & Plan Note (Signed)
 Working with Dr. Waylan with Weight mgt.

## 2023-12-20 NOTE — Progress Notes (Signed)
 Established Patient Office Visit  Patient ID: Patricia Waters, female    DOB: [birth year]  Age: 68 y.o. MRN: [id number] PCP: Alvan Dorothyann JONETTA, MD  Chief Complaint  Patient presents with   ifg    Subjective:     HPI  Discussed the use of AI scribe software for clinical note transcription with the patient, who gave verbal consent to proceed.  History of Present Illness Patricia Waters is a 68 year old female who presents for joint pain management and medication refill.  Articular pain - Significant joint pain, predominantly affecting the left ankle - Pain is exacerbated by physical activity at her church - Intermittent use of ankle brace for support - Previously managed by sports medicine with tramadol  as needed - Currently managed with gabapentin , celecoxib , and duloxetine   Hepatic enzyme elevation - Recent elevation in liver enzymes - Referral to gastroenterology for further evaluation - Gastroenterology appointment scheduled in the next couple of weeks  Impaired glucose tolerance - Diagnosis of prediabetes - Current hemoglobin A1c is 5.4 - Active participation in weight management with Dr. Darice Haddock     ROS    Objective:     BP 120/78   Pulse 72   Ht 5' 4 (1.626 m)   Wt 241 lb (109.3 kg)   SpO2 98%   BMI 41.37 kg/m    Physical Exam Vitals and nursing note reviewed.  Constitutional:      Appearance: Normal appearance.  HENT:     Head: Normocephalic and atraumatic.  Eyes:     Conjunctiva/sclera: Conjunctivae normal.  Cardiovascular:     Rate and Rhythm: Normal rate and regular rhythm.  Pulmonary:     Effort: Pulmonary effort is normal.     Breath sounds: Normal breath sounds.  Skin:    General: Skin is warm and dry.  Neurological:     Mental Status: She is alert.  Psychiatric:        Mood and Affect: Mood normal.      Results for orders placed or performed in visit on 12/20/23  POCT HgB A1C  Result Value Ref Range    Hemoglobin A1C 5.4 4.0 - 5.6 %   HbA1c POC (<> result, manual entry)     HbA1c, POC (prediabetic range)     HbA1c, POC (controlled diabetic range)        The ASCVD Risk score (Arnett DK, et al., 2019) failed to calculate for the following reasons:   The valid total cholesterol range is 130 to 320 mg/dL    Assessment & Plan:   Problem List Items Addressed This Visit       Endocrine   IFG (impaired fasting glucose)   Relevant Orders   POCT HgB A1C (Completed)     Musculoskeletal and Integument   Primary osteoarthritis of left ankle   Relevant Medications   traMADol  (ULTRAM ) 50 MG tablet     Other   Obesity, Class III, BMI 40-49.9 (morbid obesity) (HCC)   Working with Dr. Haddock with Weight mgt.        Relevant Orders   POCT HgB A1C (Completed)   Other Visit Diagnoses       Hypertension, unspecified type    -  Primary   Relevant Orders   POCT HgB A1C (Completed)       Assessment and Plan Assessment & Plan Left ankle joint pain Chronic pain exacerbated by activity, managed with brace and tramadol . - Refilled tramadol  prescription for as-needed use.  Obesity, class 3 Managed with weight program under Dr. Darice Haddock. Georjean considered pending liver function results. - Continue weight management program with Dr. Darice Haddock. - Consider [phone] if liver function tests are normal.  Impaired fasting glucose (prediabetes) Prediabetes with A1c at 5.4, indicating good control.  Elevated liver enzymes, under evaluation Elevation under evaluation by GI specialist with upcoming appointment and lab work. - Proceed with GI referral for further evaluation.    Return in about 6 months (around 06/18/2024).    Dorothyann Byars, MD [facility] at [facility]

## 2023-12-25 ENCOUNTER — Other Ambulatory Visit: Payer: Self-pay | Admitting: Internal Medicine

## 2023-12-25 DIAGNOSIS — R748 Abnormal levels of other serum enzymes: Secondary | ICD-10-CM

## 2023-12-26 ENCOUNTER — Telehealth: Payer: Self-pay | Admitting: *Deleted

## 2023-12-26 NOTE — Telephone Encounter (Signed)
 Per provider moved appt from 1/15 to 1/22, patient aware

## 2024-01-08 ENCOUNTER — Other Ambulatory Visit: Payer: Self-pay | Admitting: Family Medicine

## 2024-01-09 ENCOUNTER — Ambulatory Visit (INDEPENDENT_AMBULATORY_CARE_PROVIDER_SITE_OTHER)

## 2024-01-09 VITALS — BP 143/68 | HR 82 | Ht 64.0 in | Wt 253.0 lb

## 2024-01-09 DIAGNOSIS — Z0000 Encounter for general adult medical examination without abnormal findings: Secondary | ICD-10-CM | POA: Diagnosis not present

## 2024-01-09 NOTE — Progress Notes (Signed)
 Chief Complaint  Patient presents with   Medicare Wellness     Subjective:   Patricia Waters is a 68 y.o. female who presents for a Medicare Annual Wellness Visit.  Visit info / Clinical Intake: Medicare Wellness Visit Type:: Subsequent Annual Wellness Visit Persons participating in visit and providing information:: patient Medicare Wellness Visit Mode:: In-person (required for WTM) Interpreter Needed?: No Pre-visit prep was completed: yes AWV questionnaire completed by patient prior to visit?: no Living arrangements:: lives with spouse/significant other Patient's Overall Health Status Rating: very good Typical amount of pain: some Does pain affect daily life?: no Are you currently prescribed opioids?: (!) yes  Dietary Habits and Nutritional Risks How many meals a day?: 2 Eats fruit and vegetables daily?: yes Most meals are obtained by: preparing own meals In the last 2 weeks, have you had any of the following?: none Diabetic:: no  Functional Status Activities of Daily Living (to include ambulation/medication): Independent Ambulation: Independent Medication Administration: Independent Home Management (perform basic housework or laundry): Independent Manage your own finances?: yes Primary transportation is: driving Concerns about vision?: no *vision screening is required for WTM* Concerns about hearing?: (!) yes Uses hearing aids?: no Hear whispered voice?: yes  Fall Screening Falls in the past year?: 0 Number of falls in past year: 0 Was there an injury with Fall?: 0 Fall Risk Category Calculator: 0 Patient Fall Risk Level: Low Fall Risk  Fall Risk Patient at Risk for Falls Due to: No Fall Risks Fall risk Follow up: Falls evaluation completed  Home and Transportation Safety: All rugs have non-skid backing?: yes All stairs or steps have railings?: N/A, no stairs Grab bars in the bathtub or shower?: yes Have non-skid surface in bathtub or shower?: yes Good  home lighting?: yes Regular seat belt use?: yes Hospital stays in the last year:: no  Cognitive Assessment Difficulty concentrating, remembering, or making decisions? : no Will 6CIT or Mini Cog be Completed: yes What year is it?: 0 points What month is it?: 0 points Give patient an address phrase to remember (5 components): [address] [place], [place] [address] About what time is it?: 0 points Count backwards from 20 to 1: 0 points Say the months of the year in reverse: 0 points Repeat the address phrase from earlier: 0 points 6 CIT Score: 0 points  Advance Directives (For Healthcare) Does Patient Have a Medical Advance Directive?: Yes Does patient want to make changes to medical advance directive?: No - Patient declined Type of Advance Directive: Healthcare Power of [place]; Living will  Reviewed/Updated  Reviewed/Updated: Medical History; Surgical History; Medications; Allergies; Care Teams; Patient Goals    Allergies (verified) Patient has no known allergies.   Current Medications (verified) Outpatient Encounter Medications as of 01/09/2024  Medication Sig   AMBULATORY NON FORMULARY MEDICATION Medication Name: CPAP with humidifier with 11 cm water  pressure.  Dx OSA.  See attached Sleep Study.  Please fax to [facility].   atorvastatin  (LIPITOR) 20 MG tablet TAKE 1 TABLET BY MOUTH AT  BEDTIME   CALCIUM  PO Take 1 tablet by mouth daily.   celecoxib  (CELEBREX ) 200 MG capsule TAKE 1 TO 2 CAPSULES BY MOUTH  DAILY AS NEEDED FOR PAIN   DULoxetine  (CYMBALTA ) 60 MG capsule Take 1 capsule (60 mg total) by mouth daily.   gabapentin  (NEURONTIN ) 300 MG capsule TAKE 2 CAPSULES BY MOUTH DAILY   Multiple Vitamins-Minerals (CENTRUM SILVER 50+WOMEN) TABS Take 1 capsule by mouth daily.   Multiple Vitamins-Minerals (PRESERVISION AREDS)  CAPS Take 1 capsule by mouth daily.   rOPINIRole  (REQUIP ) 0.5 MG tablet TAKE 1 TABLET BY MOUTH AT  BEDTIME   traMADol  (ULTRAM ) 50 MG tablet Take 1 tablet (50 mg  total) by mouth every 8 (eight) hours as needed for moderate pain (pain score 4-6).   vitamin C (ASCORBIC ACID) 500 MG tablet Take 500 mg by mouth daily.   No facility-administered encounter medications on file as of 01/09/2024.    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     BILATERAL TOTAL MASTECTOMY WITH AXILLARY LYMPH NODE DISSECTION Bilateral 2005   BREAST LUMPECTOMY  1994   right   CERVICAL FUSION  1995   C5-C7   IR IMAGING GUIDED PORT INSERTION  09/11/2019   IR REMOVAL TUN ACCESS W/ PORT W/O FL MOD SED  02/06/2022   MASTECTOMY  2005   Bilat    ROBOTIC ASSISTED TOTAL HYSTERECTOMY WITH BILATERAL SALPINGO OOPHERECTOMY N/A 08/14/2019   Procedure: XI ROBOTIC ASSISTED TOTAL HYSTERECTOMY WITH BILATERAL SALPINGO OOPHORECTOMY;  Surgeon: Eloy Herring, MD;  Location: [facility];  Service: Gynecology;  Laterality: N/A;   SENTINEL NODE BIOPSY N/A 08/14/2019   Procedure: SENTINEL NODE BIOPSY;  Surgeon: Eloy Herring, MD;  Location: [facility];  Service: Gynecology;  Laterality: N/A;   WISDOM TOOTH EXTRACTION Bilateral 2004   Family History  Problem Relation Age of Onset   Stroke Mother    Hyperlipidemia Mother    Skin cancer Mother    Breast cancer Mother 35       ? recurrence at 60   Endometrial cancer Mother 68   Hypertension Mother    Cancer Mother    Obesity Mother    Diabetes Father    Hyperlipidemia Father    Heart attack Father 66       CABG   Colon cancer Father 63   Prostate cancer Father 46        Gleason 9   Hypertension Father    Heart disease Father    Cancer Father    Sleep apnea Father    Alcoholism Father    Obesity Father    Hyperlipidemia Sister    Breast cancer Sister 3   Heart attack Maternal Grandmother    Skin cancer Maternal Grandmother    Rheum arthritis Maternal Grandmother    Heart attack Maternal Grandfather    Diabetes Paternal Grandmother    Heart attack Paternal Grandmother    Rheum arthritis Paternal Grandmother    Heart attack Paternal Grandfather    Lymphoma Maternal Aunt 77   Kidney cancer Maternal Aunt 60   Cervical cancer Cousin 32   Social History   Occupational History   Occupation: Retired Civil Engineer, Contracting: [employer]    Comment: [employer]  Tobacco  Use   Smoking status: Former    Current packs/day: 0.00    Types: Cigarettes    Quit date: 10/31/2006    Years since quitting: 17.2   Smokeless tobacco: Never  Vaping Use   Vaping status: Never Used  Substance and Sexual Activity   Alcohol use: Yes   Drug use: No   Sexual activity: Not Currently    Partners: Female    Birth control/protection: None   Tobacco Counseling Counseling given: Not Answered  SDOH Screenings   Food Insecurity: No Food Insecurity (01/09/2024)  Housing: Low Risk  (01/09/2024)  Transportation Needs: No Transportation Needs (01/09/2024)  Utilities: Not At Risk (01/09/2024)  Alcohol Screen: Medium Risk (12/18/2023)  Depression (PHQ2-9): Low Risk  (01/09/2024)  Financial Resource Strain: Low Risk  (12/18/2023)  Physical Activity: Insufficiently Active (01/09/2024)  Social Connections: Socially Integrated (01/09/2024)  Stress: No Stress Concern Present (01/09/2024)  Tobacco Use: Medium Risk (12/20/2023)  Health Literacy: Adequate Health Literacy (01/09/2024)   See flowsheets for full screening details  Depression Screen PHQ 2 & 9 Depression Scale- Over the past 2 weeks, how often have you been bothered by any of the following  problems? Little interest or pleasure in doing things: 0 Feeling down, depressed, or hopeless (PHQ Adolescent also includes...irritable): 0 PHQ-2 Total Score: 0 Trouble falling or staying asleep, or sleeping too much: 0 Feeling tired or having little energy: 0 Poor appetite or overeating (PHQ Adolescent also includes...weight loss): 1 Feeling bad about yourself - or that you are a failure or have let yourself or your family down: 0 Trouble concentrating on things, such as reading the newspaper or watching television (PHQ Adolescent also includes...like school work): 0 Moving or speaking so slowly that other people could have noticed. Or the opposite - being so fidgety or restless that you have been moving around a lot more than usual: 0 Thoughts that you would be better off dead, or of hurting yourself in some way: 0 PHQ-9 Total Score: 1 If you checked off any problems, how difficult have these problems made it for you to do your work, take care of things at home, or get along with other people?: Not difficult at all     Goals Addressed             This Visit's Progress    Patient Stated       Patient states she would like to lose weight.              Objective:    Today's Vitals   01/09/24 0909 01/09/24 0944  BP: (!) 142/71 (!) 143/68  Pulse: 82   SpO2: 98%   Weight: 253 lb (114.8 kg)   Height: 5' 4 (1.626 m)    Body mass index is 43.43 kg/m.  Hearing/Vision screen No results found. Immunizations and Health Maintenance Health Maintenance  Topic Date Due   COVID-19 Vaccine (9 - Pfizer risk 2025-26 season) 05/04/2024   Medicare Annual Wellness (AWV)  01/08/2025   Colonoscopy  10/24/2027   DTaP/Tdap/Td (4 - Td or Tdap) 03/23/2029   Pneumococcal Vaccine: 50+ Years  Completed   Influenza Vaccine  Completed   Bone Density Scan  Completed   Hepatitis C Screening  Completed   Zoster Vaccines- Shingrix   Completed   Meningococcal B Vaccine  Aged Out         Assessment/Plan:  This is a routine wellness examination for Patricia Waters.  Patient Care Team: Alvan Dorothyann BIRCH, MD as PCP - General (  Family Medicine) Lavona Agent, MD as PCP - Cardiology (Cardiology) Ivin Kocher, MD (Dermatology) Colon Shove, MD (Neurosurgery) Cris Burnard DEL, MD (Obstetrics and Gynecology) Viktoria Comer SAUNDERS, MD as Consulting Physician (Gynecologic Oncology)  I have personally reviewed and noted the following in the patients chart:   Medical and social history Use of alcohol, tobacco or illicit drugs  Current medications and supplements including opioid prescriptions. Functional ability and status Nutritional status Physical activity Advanced directives List of other physicians Hospitalizations, surgeries, and ER visits in previous 12 months Vitals Screenings to include cognitive, depression, and falls Referrals and appointments  No orders of the defined types were placed in this encounter.  In addition, I have reviewed and discussed with patient certain preventive protocols, quality metrics, and best practice recommendations. A written personalized care plan for preventive services as well as general preventive health recommendations were provided to patient.   Bonny Jon Mayor, CMA   01/09/2024   Return in 1 year (on 01/08/2025).  After Visit Summary: (In Person-Declined) Patient declined AVS at this time.  Nurse Notes:   Patricia Waters is a 68 y.o. female patient of Dorothyann Byars, MD who had a Medicare Annual Wellness Visit today. Patricia Waters lives with her spouse. She reports that she is socially active and does interact with friends/family regularly. She is moderately physically active. She  enjoys music and reading.

## 2024-01-09 NOTE — Patient Instructions (Signed)
 [name],  Thank you for taking the time for your Medicare Wellness Visit. I appreciate your continued commitment to your health goals. Please review the care plan we discussed, and feel free to reach out if I can assist you further.  Please note that Annual Wellness Visits do not include a physical exam. Some assessments may be limited, especially if the visit was conducted virtually. If needed, we may recommend an in-person follow-up with your provider.  Ongoing Care Seeing your primary care provider every 3 to 6 months helps us  monitor your health and provide consistent, personalized care.   Referrals If a referral was made during today's visit and you haven't received any updates within two weeks, please contact the referred provider directly to check on the status.  Recommended Screenings:  Health Maintenance  Topic Date Due   Medicare Annual Wellness Visit  12/21/2023   COVID-19 Vaccine (9 - Pfizer risk 2025-26 season) 05/04/2024   Colon Cancer Screening  10/24/2027   DTaP/Tdap/Td vaccine (4 - Td or Tdap) 03/23/2029   Pneumococcal Vaccine for age over 5  Completed   Flu Shot  Completed   Osteoporosis screening with Bone Density Scan  Completed   Hepatitis C Screening  Completed   Zoster (Shingles) Vaccine  Completed   Meningitis B Vaccine  Aged Out       01/09/2024    9:20 AM  Advanced Directives  Does Patient Have a Medical Advance Directive? Yes  Type of Estate Agent of [place];Living will  Does patient want to make changes to medical advance directive? No - Patient declined    Vision: Annual vision screenings are recommended for early detection of glaucoma, cataracts, and diabetic retinopathy. These exams can also reveal signs of chronic conditions such as diabetes and high blood pressure.  Dental: Annual dental screenings help detect early signs of oral cancer, gum disease, and other conditions linked to overall health, including heart disease  and diabetes.  Please see the attached documents for additional preventive care recommendations.

## 2024-01-11 ENCOUNTER — Ambulatory Visit

## 2024-01-11 DIAGNOSIS — R748 Abnormal levels of other serum enzymes: Secondary | ICD-10-CM | POA: Diagnosis not present

## 2024-01-14 ENCOUNTER — Other Ambulatory Visit: Payer: Self-pay | Admitting: Hematology and Oncology

## 2024-01-14 DIAGNOSIS — C541 Malignant neoplasm of endometrium: Secondary | ICD-10-CM

## 2024-01-15 ENCOUNTER — Inpatient Hospital Stay: Payer: Medicare Other

## 2024-01-15 ENCOUNTER — Encounter: Payer: Self-pay | Admitting: Hematology and Oncology

## 2024-01-15 ENCOUNTER — Inpatient Hospital Stay: Payer: Medicare Other | Attending: Hematology and Oncology | Admitting: Hematology and Oncology

## 2024-01-15 VITALS — BP 142/64 | HR 65 | Temp 98.0°F | Resp 18 | Ht 64.0 in | Wt 247.0 lb

## 2024-01-15 DIAGNOSIS — Z853 Personal history of malignant neoplasm of breast: Secondary | ICD-10-CM | POA: Diagnosis not present

## 2024-01-15 DIAGNOSIS — Z6841 Body Mass Index (BMI) 40.0 and over, adult: Secondary | ICD-10-CM | POA: Insufficient documentation

## 2024-01-15 DIAGNOSIS — C541 Malignant neoplasm of endometrium: Secondary | ICD-10-CM

## 2024-01-15 DIAGNOSIS — Z90710 Acquired absence of both cervix and uterus: Secondary | ICD-10-CM | POA: Diagnosis not present

## 2024-01-15 DIAGNOSIS — E66813 Obesity, class 3: Secondary | ICD-10-CM | POA: Diagnosis not present

## 2024-01-15 DIAGNOSIS — Z9221 Personal history of antineoplastic chemotherapy: Secondary | ICD-10-CM | POA: Diagnosis not present

## 2024-01-15 LAB — CBC WITH DIFFERENTIAL/PLATELET
Abs Immature Granulocytes: 0.02 K/uL (ref 0.00–0.07)
Basophils Absolute: 0.1 K/uL (ref 0.0–0.1)
Basophils Relative: 1 %
Eosinophils Absolute: 0.1 K/uL (ref 0.0–0.5)
Eosinophils Relative: 1 %
HCT: 41.4 % (ref 36.0–46.0)
Hemoglobin: 13.9 g/dL (ref 12.0–15.0)
Immature Granulocytes: 0 %
Lymphocytes Relative: 21 %
Lymphs Abs: 1.9 K/uL (ref 0.7–4.0)
MCH: 31.8 pg (ref 26.0–34.0)
MCHC: 33.6 g/dL (ref 30.0–36.0)
MCV: 94.7 fL (ref 80.0–100.0)
Monocytes Absolute: 0.7 K/uL (ref 0.1–1.0)
Monocytes Relative: 7 %
Neutro Abs: 6.5 K/uL (ref 1.7–7.7)
Neutrophils Relative %: 70 %
Platelets: 279 K/uL (ref 150–400)
RBC: 4.37 MIL/uL (ref 3.87–5.11)
RDW: 12.6 % (ref 11.5–15.5)
WBC: 9.2 K/uL (ref 4.0–10.5)
nRBC: 0 % (ref 0.0–0.2)

## 2024-01-15 LAB — COMPREHENSIVE METABOLIC PANEL WITH GFR
ALT: 20 U/L (ref 0–44)
AST: 26 U/L (ref 15–41)
Albumin: 4.5 g/dL (ref 3.5–5.0)
Alkaline Phosphatase: 149 U/L — ABNORMAL HIGH (ref 38–126)
Anion gap: 9 (ref 5–15)
BUN: 13 mg/dL (ref 8–23)
CO2: 28 mmol/L (ref 22–32)
Calcium: 10.2 mg/dL (ref 8.9–10.3)
Chloride: 103 mmol/L (ref 98–111)
Creatinine, Ser: 0.81 mg/dL (ref 0.44–1.00)
GFR, Estimated: >60 mL/min (ref >60)
Glucose, Bld: 107 mg/dL — ABNORMAL HIGH (ref 70–99)
Potassium: 4.7 mmol/L (ref 3.5–5.1)
Sodium: 140 mmol/L (ref 135–145)
Total Bilirubin: 0.5 mg/dL (ref 0.0–1.2)
Total Protein: 7.7 g/dL (ref 6.5–8.1)

## 2024-01-15 NOTE — Progress Notes (Signed)
 [facility] OFFICE PROGRESS NOTE  Patient Care Team: Alvan Dorothyann BIRCH, MD as PCP - General (Family Medicine) Lynwood Schilling, MD as PCP - Cardiology (Cardiology) Ivin Kocher, MD (Dermatology) Colon Shove, MD (Neurosurgery) Cris Burnard DEL, MD (Obstetrics and Gynecology) Viktoria Comer SAUNDERS, MD as Consulting Physician (Gynecologic Oncology)  Assessment & Plan Endometrial cancer Patricia Waters) The patient had remote history of breast cancer She was diagnosed with endometrioid uterine cancer in 2021, status post hysterectomy and completion of adjuvant chemotherapy by November 2021 All her prior imaging study showed no evidence of disease recurrence She is not symptomatic She is more than 4 years out from disease She has appointment to see GYN next year I will discontinue follow-up. Obesity, Class III, BMI 40-49.9 (morbid obesity) (HCC) She has significant obesity and recent elevated alkaline phosphatase is consistent with fatty liver, confirmed on ultrasound of the abdomen We discussed importance of weight loss and exercise  No orders of the defined types were placed in this encounter.    Patricia Bedford, MD  INTERVAL HISTORY: she returns for surveillance follow-up  She is doing well She underwent extensive evaluation recently due to elevated liver enzymes She appears to have lost some motivation with exercise and weight loss but appears to go on GLP-1 agonist to help with weight loss effort She denies abdominal pain or changes in bowel habits  PHYSICAL EXAMINATION: ECOG PERFORMANCE STATUS: 0 - Asymptomatic  Vitals:   01/15/24 0926  BP: (!) 142/64  Pulse: 65  Resp: 18  Temp: 98 F (36.7 C)  SpO2: 97%   Filed Weights   01/15/24 0926  Weight: 247 lb (112 kg)    Relevant data reviewed during this visit included CBC, CMP, recent ultrasound report

## 2024-01-15 NOTE — Assessment & Plan Note (Addendum)
 The patient had remote history of breast cancer She was diagnosed with endometrioid uterine cancer in 2021, status post hysterectomy and completion of adjuvant chemotherapy by November 2021 All her prior imaging study showed no evidence of disease recurrence She is not symptomatic She is more than 4 years out from disease She has appointment to see GYN next year I will discontinue follow-up.

## 2024-01-15 NOTE — Assessment & Plan Note (Addendum)
 She has significant obesity and recent elevated alkaline phosphatase is consistent with fatty liver, confirmed on ultrasound of the abdomen We discussed importance of weight loss and exercise

## 2024-01-17 ENCOUNTER — Other Ambulatory Visit (HOSPITAL_COMMUNITY): Payer: Self-pay | Admitting: Internal Medicine

## 2024-01-17 DIAGNOSIS — R748 Abnormal levels of other serum enzymes: Secondary | ICD-10-CM

## 2024-01-29 ENCOUNTER — Encounter: Payer: Self-pay | Admitting: Family Medicine

## 2024-01-29 ENCOUNTER — Ambulatory Visit (INDEPENDENT_AMBULATORY_CARE_PROVIDER_SITE_OTHER): Admitting: Family Medicine

## 2024-01-29 VITALS — BP 138/83 | HR 65 | Temp 98.2°F | Ht 64.0 in | Wt 244.0 lb

## 2024-01-29 DIAGNOSIS — R748 Abnormal levels of other serum enzymes: Secondary | ICD-10-CM

## 2024-01-29 DIAGNOSIS — Z636 Dependent relative needing care at home: Secondary | ICD-10-CM | POA: Diagnosis not present

## 2024-01-29 DIAGNOSIS — Z6841 Body Mass Index (BMI) 40.0 and over, adult: Secondary | ICD-10-CM

## 2024-01-29 DIAGNOSIS — R632 Polyphagia: Secondary | ICD-10-CM | POA: Diagnosis not present

## 2024-01-29 NOTE — Progress Notes (Signed)
 "  Office: [phone]  /  Fax: [phone]  WEIGHT SUMMARY AND BIOMETRICS  Starting Date: 07/30/23  Starting Weight: 253lb   Weight Lost Since Last Visit: 0lb   Vitals Temp: 98.2 F (36.8 C) BP: 138/83 Pulse Rate: 65 SpO2: 96 %   Body Composition  Body Fat %: 50.1 % Fat Mass (lbs): 122.4 lbs Muscle Mass (lbs): 115.6 lbs Total Body Water  (lbs): 86 lbs Visceral Fat Rating : 17     HPI  Chief Complaint: OBESITY  Patricia Waters is here to discuss her progress with her obesity treatment plan. She is on the the Category 3 Plan and states she is following her eating plan approximately 40 % of the time. She states she is exercising 0 minutes 0 times per week.   Interval History:  Since last office visit she is up 3 lb She did see [facility] and is scheduled for a liver biopsy in January  She is nervous about having the biopsy done given her hx of endometrial cancer She did travel to [place]  for Christmas and has had some celebration eating She has a net weight loss of 9 lb in 5 mos of medically supervised weight management This is a 3.5% TBW loss She has caregiver stress w/ her mom Admits to a lack of regular exercise and over - snacking  Pharmacotherapy: none (she has declined)  PHYSICAL EXAM:  Blood pressure 138/83, pulse 65, temperature 98.2 F (36.8 C), height 5' 4 (1.626 m), weight 244 lb (110.7 kg), SpO2 96%. Body mass index is 41.88 kg/m.  General: She is overweight, cooperative, alert, well developed, and in no acute distress. PSYCH: Has normal mood, affect and thought process.   Lungs: Normal breathing effort, no conversational dyspnea.  ASSESSMENT AND PLAN  TREATMENT PLAN FOR OBESITY:  Recommended Dietary Goals  Kriti is currently in the action stage of change. As such, her goal is to continue weight management plan. She has agreed to keeping a food journal and adhering to recommended goals of 1600 calories and 90-100 g of  protein.  Behavioral  Intervention  We discussed the following Behavioral Modification Strategies today: increasing lean protein intake to established goals, increasing fiber rich foods, increasing water  intake , work on meal planning and preparation, work on counselling psychologist calories using tracking application, keeping healthy foods at home, practice mindfulness eating and understand the difference between hunger signals and cravings, work on managing stress, creating time for self-care and relaxation, avoiding temptations and identifying enticing environmental cues, and planning for success.  Additional resources provided today: NA  Recommended Physical Activity Goals  Michelle has been advised to work up to 150 minutes of moderate intensity aerobic activity a week and strengthening exercises 2-3 times per week for cardiovascular health, weight loss maintenance and preservation of muscle mass.   She has agreed to Start aerobic activity with a goal of 150 minutes a week at moderate intensity.  Add in more regular walking  Pharmacotherapy changes for the treatment of obesity: none  ASSOCIATED CONDITIONS ADDRESSED TODAY  Polyphagia Worsened with more ultraprocessed foods over the holidays, increased sugar intake She agrees to getting back to lean protein + fiber with meals, improving water  intake and eating on a schedule, reducing meals out  Consider the addition of AOM if not progressing over the next 3 mos  Morbid obesity (HCC) She has declined bariatric surgery and anti obesity medication She has room for improvement with dietary logging and more structured exercise  BMI 40.0-44.9, adult (  HCC)  Elevated serum alkaline phosphatase level Seen by [facility], notes reviewed from 01/16/24 Awaiting liver biopsy mid January  Caregiver stress Unchanged      She was informed of the importance of frequent follow up visits to maximize her success with intensive lifestyle modifications for her multiple  health conditions.   ATTESTASTION STATEMENTS:  Reviewed by clinician on day of visit: allergies, medications, problem list, medical history, surgical history, family history, social history, and previous encounter notes pertinent to obesity diagnosis.   I personally spent a total of 24 minutes in the care of the patient today including preparing to see the patient, getting/reviewing separately obtained history, performing a medically appropriate exam/evaluation, counseling and educating, and documenting clinical information in the EHR.    Darice Haddock, D.O. DABFM, DABOM [facility] [address] [place], [place] [address] [phone] "

## 2024-01-29 NOTE — Patient Instructions (Signed)
 Aim for a good 30 min of walking 3 days/ wk  Best of luck with upcoming liver biopsy!  Keep calories ~1600 per day This should include 90-100 g of protein daily Hydrate well with water  Limit sweets and treats

## 2024-02-13 ENCOUNTER — Encounter (HOSPITAL_COMMUNITY): Payer: Self-pay | Admitting: Radiology

## 2024-02-14 ENCOUNTER — Ambulatory Visit: Admitting: Gynecologic Oncology

## 2024-02-15 ENCOUNTER — Other Ambulatory Visit (HOSPITAL_COMMUNITY)

## 2024-02-17 NOTE — H&P (Signed)
 "     Chief Complaint: Elevated Liver Enzymes. Request is for non target liver biopsy  Referring Physician(s): Kriss Estefana DEL  Supervising Physician: Luverne Aran  Patient Status: [facility] - Out-pt  History of Present Illness: Patricia Waters is a 69 y.o. female outpatient. History of HLD, morbid obesity breast cancer, endometrial/uterine cancer, s/p hysterectomy and adjuvant chemotherapy found to have elevated liver enzymes. Team is requesting a liver biopsy for further evaluation.   Currently without any significant complaints. Patient alert and laying in bed,calm. Denies any fevers, headache, chest pain, SOB, cough, abdominal pain, nausea, vomiting or bleeding.   US  abdomen dated 12.12.25 reads Increased hepatic echogenicity, a nonspecific finding that is most commonly seen on the basis of steatosis in the absence of known liver disease.***labs pending. All medications are within acceptable parameters. Patient has been NPO since midnight. NKDA  Return precautions and treatment recommendations and follow-up discussed with the patient *** who is agreeable with the plan.    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     BILATERAL TOTAL MASTECTOMY WITH AXILLARY LYMPH NODE DISSECTION Bilateral 2005   BREAST LUMPECTOMY  1994   right   CERVICAL FUSION  1995   C5-C7   IR IMAGING GUIDED PORT INSERTION  09/11/2019   IR REMOVAL TUN ACCESS W/ PORT W/O FL MOD SED  02/06/2022   MASTECTOMY  2005   Bilat     ROBOTIC ASSISTED TOTAL HYSTERECTOMY WITH BILATERAL SALPINGO OOPHERECTOMY N/A 08/14/2019   Procedure: XI ROBOTIC ASSISTED TOTAL HYSTERECTOMY WITH BILATERAL SALPINGO OOPHORECTOMY;  Surgeon: Eloy Herring, MD;  Location: [facility] ORS;  Service: Gynecology;  Laterality: N/A;   SENTINEL NODE BIOPSY N/A 08/14/2019   Procedure: SENTINEL NODE BIOPSY;  Surgeon: Eloy Herring, MD;  Location: [facility];  Service: Gynecology;  Laterality: N/A;   WISDOM TOOTH EXTRACTION Bilateral 2004    Allergies: Patient has no known allergies.  Medications: Prior to Admission medications  Medication Sig Start Date End Date Taking? Authorizing Provider  AMBULATORY NON FORMULARY MEDICATION Medication Name: CPAP with humidifier with 11 cm water  pressure.  Dx OSA.  See attached Sleep Study.  Please fax to [facility]. 01/02/20   Alvan Dorothyann JONETTA, MD  atorvastatin  (LIPITOR) 20 MG tablet TAKE 1 TABLET BY MOUTH AT  BEDTIME 01/08/24   Alvan Dorothyann JONETTA, MD  CALCIUM  PO Take 1 tablet by mouth daily.    [provider]  celecoxib  (CELEBREX ) 200 MG capsule TAKE 1 TO 2 CAPSULES BY MOUTH  DAILY AS NEEDED FOR PAIN 10/25/23   Alvan Dorothyann JONETTA, MD  DULoxetine  (CYMBALTA ) 60 MG capsule Take 1 capsule (60 mg total) by mouth daily. 06/11/23   Alvan Dorothyann JONETTA, MD  fluticasone  (FLONASE  ALLERGY RELIEF) 50 MCG/ACT nasal spray 1 spray in each nostril Nasally as needed;  Duration: 30 days    [provider]  gabapentin  (NEURONTIN ) 300 MG capsule TAKE 2 CAPSULES BY MOUTH DAILY 04/24/23   Alvan Dorothyann BIRCH, MD  Krill Oil 350 MG CAPS 1 capsule.    [provider]  levocetirizine (XYZAL) 5 MG tablet Take 5 mg by mouth every evening.    [provider]  Multiple Vitamin (MULTI VITAMIN) TABS 1 tablet Orally Once a day    [provider]  Multiple Vitamins-Minerals (CENTRUM SILVER 50+WOMEN) TABS Take 1 capsule by mouth daily.    [provider]  Multiple Vitamins-Minerals (PRESERVISION AREDS)  CAPS Take 1 capsule by mouth daily.    [provider]  rOPINIRole  (REQUIP ) 0.5 MG tablet TAKE 1 TABLET BY MOUTH AT  BEDTIME 04/24/23   Alvan Dorothyann BIRCH, MD  traMADol  (ULTRAM ) 50 MG tablet Take 1 tablet (50 mg total) by mouth every 8 (eight) hours as needed for moderate pain (pain score 4-6). 12/20/23   Alvan Dorothyann BIRCH, MD  vitamin C (ASCORBIC ACID) 500 MG tablet Take 500 mg by mouth daily.    [provider]  VITAMIN D -VITAMIN K PO Take by mouth.    [provider]     Family History  Problem Relation Age of Onset   Stroke Mother    Hyperlipidemia Mother    Skin cancer Mother    Breast cancer Mother 54       ? recurrence at 62   Endometrial cancer Mother 40   Hypertension Mother    Cancer Mother    Obesity Mother    Diabetes Father    Hyperlipidemia Father    Heart attack Father 68       CABG   Colon cancer Father 84   Prostate cancer Father 38       Gleason 9   Hypertension Father    Heart disease Father    Cancer Father    Sleep apnea Father    Alcoholism Father    Obesity Father    Hyperlipidemia Sister    Breast cancer Sister 13   Heart attack Maternal Grandmother    Skin cancer Maternal Grandmother    Rheum arthritis Maternal Grandmother    Heart attack Maternal Grandfather    Diabetes Paternal Grandmother    Heart attack Paternal Grandmother    Rheum arthritis Paternal Grandmother    Heart attack Paternal Grandfather    Lymphoma Maternal Aunt 51   Kidney cancer Maternal Aunt 60   Cervical cancer Cousin 42    Social History   Socioeconomic History   Marital status: Married    Spouse name: Engineer, Drilling   Number of children: 0   Years of education: Not on file   Highest education level: Some college, no degree  Occupational History   Occupation: Retired Civil Engineer, Contracting: [employer]    Comment: [employer]  Tobacco Use   Smoking status: Former    Current packs/day: 0.00    Types: Cigarettes     Quit date: 10/31/2006    Years since quitting: 17.3   Smokeless tobacco: Never  Vaping Use   Vaping status: Never Used  Substance and Sexual Activity   Alcohol use: Yes   Drug use: No   Sexual activity: Not Currently    Partners: Female    Birth control/protection: None  Other Topics Concern   Not on file  Social History Narrative   Lives with her partner, Jonetta. Former psychologist, forensic.  Social Drivers of Health   Tobacco Use: Medium Risk (01/29/2024)   Patient History    Smoking Tobacco Use: Former    Smokeless Tobacco Use: Never    Passive Exposure: Not on file  Financial Resource Strain: Low Risk (12/18/2023)   Overall Financial Resource Strain (CARDIA)    Difficulty of Paying Living Expenses: Not hard at all  Food Insecurity: No Food Insecurity (01/09/2024)   Epic    Worried About Programme Researcher, Broadcasting/film/video in the Last Year: Never true    Ran Out of Food in the Last Year: Never true  Transportation Needs: No Transportation Needs (01/09/2024)   Epic    Lack of Transportation (Medical): No    Lack of Transportation (Non-Medical): No  Physical Activity: Insufficiently Active (01/09/2024)   Exercise Vital Sign    Days of Exercise per Week: 2 days    Minutes of Exercise per Session: 40 min  Stress: No Stress Concern Present (01/09/2024)   [employer] of Occupational Health - Occupational Stress Questionnaire    Feeling of Stress: Only a little  Social Connections: Socially Integrated (01/09/2024)   Social Connection and Isolation Panel    Frequency of Communication with Friends and Family: More than three times a week    Frequency of Social Gatherings with Friends and Family: Twice a week    Attends Religious Services: More than 4 times per year    Active Member of Clubs or Organizations: Yes    Attends Banker Meetings: More than 4 times per year    Marital Status: Married  Depression (PHQ2-9): Low Risk (01/09/2024)   Depression (PHQ2-9)    PHQ-2 Score:  0  Alcohol Screen: Medium Risk (12/18/2023)   Alcohol Screen    Last Alcohol Screening Score (AUDIT): 12  Housing: Low Risk (01/09/2024)   Epic    Unable to Pay for Housing in the Last Year: No    Number of Times Moved in the Last Year: 0    Homeless in the Last Year: No  Utilities: Not At Risk (01/09/2024)   Epic    Threatened with loss of utilities: No  Health Literacy: Adequate Health Literacy (01/09/2024)   B1300 Health Literacy    Frequency of need for help with medical instructions: Never    ECOG Status: {CHL ONC ECOG PS:[id number]}  Review of Systems: A 12 point ROS discussed and pertinent positives are indicated in the HPI above.  All other systems are negative.  Review of Systems  Vital Signs: There were no vitals taken for this visit.  Advance Care Plan: {Advance Care Eojw:73180}    Physical Exam  Imaging: No results found.  Labs:  CBC: Recent Labs    06/19/23 1216 07/30/23 0842 01/15/24 0856  WBC 11.5* 7.9 9.2  HGB 12.9 13.3 13.9  HCT 39.7 41.7 41.4  PLT 256 237 279    COAGS: No results for input(s): INR, APTT in the last 8760 hours.  BMP: Recent Labs    06/19/23 1216 07/30/23 0842 01/15/24 0856  NA 142 143 140  K 4.6 4.5 4.7  CL 102 104 103  CO2 22 22 28   GLUCOSE 97 114* 107*  BUN 11 13 13   CALCIUM  10.2 9.5 10.2  CREATININE 0.77 0.82 0.81  GFRNONAA  --   --  >60    LIVER FUNCTION TESTS: Recent Labs    06/19/23 1216 07/30/23 0842 11/14/23 0916 11/16/23 0904 01/15/24 0856  BILITOT 0.6 0.6 0.4  --  0.5  AST 29  25 24  --  26  ALT 24 18 18   --  20  ALKPHOS 176* 148* 162* 170* 149*  PROT 6.9 6.7 6.6  --  7.7  ALBUMIN 4.5 4.4 4.2  --  4.5    TUMOR MARKERS: No results for input(s): AFPTM, CEA, CA199, CHROMGRNA in the last 8760 hours.  Assessment and Plan:  69 y.o. female inpatient. History of HLD, morbid obesity breast cancer, endometrial/uterine cancer, s/p hysterectomy and adjuvant chemotherapy found to have  elevated liver enzymes. Team is requesting a liver biopsy for further evaluation.   PLAN: IR Image Guided Non Targeted Liver Biopsy    Risks and benefits of non targeted liver bipsy  was discussed with the patient and/or patient's family including, but not limited to bleeding, infection, damage to adjacent structures or low yield requiring additional tests.  All of the questions were answered and there is agreement to proceed.  Consent signed and in chart.  Thank you for this interesting consult.  I greatly enjoyed meeting DORELLA LASTER and look forward to participating in their care.  A copy of this report was sent to the requesting provider on this date.  Electronically Signed: Delon JAYSON Beagle, NP 02/17/2024, 8:23 PM   I spent a total of  30 Minutes   in face to face in clinical consultation, greater than 50% of which was counseling/coordinating care for non targeted liver biopsy.  "

## 2024-02-18 ENCOUNTER — Other Ambulatory Visit: Payer: Self-pay

## 2024-02-18 ENCOUNTER — Ambulatory Visit (HOSPITAL_COMMUNITY)
Admission: RE | Admit: 2024-02-18 | Discharge: 2024-02-18 | Disposition: A | Payer: Medicare (Managed Care) | Source: Ambulatory Visit | Attending: Internal Medicine

## 2024-02-18 VITALS — BP 135/74 | HR 58 | Temp 98.3°F | Resp 17 | Ht 65.0 in | Wt 250.0 lb

## 2024-02-18 DIAGNOSIS — R748 Abnormal levels of other serum enzymes: Secondary | ICD-10-CM | POA: Diagnosis not present

## 2024-02-18 DIAGNOSIS — Z6841 Body Mass Index (BMI) 40.0 and over, adult: Secondary | ICD-10-CM | POA: Diagnosis not present

## 2024-02-18 DIAGNOSIS — Z01818 Encounter for other preprocedural examination: Secondary | ICD-10-CM | POA: Diagnosis present

## 2024-02-18 DIAGNOSIS — E7800 Pure hypercholesterolemia, unspecified: Secondary | ICD-10-CM | POA: Diagnosis not present

## 2024-02-18 HISTORY — PX: LIVER BIOPSY: SHX301

## 2024-02-18 LAB — CBC
HCT: 38.8 % (ref 36.0–46.0)
Hemoglobin: 12.8 g/dL (ref 12.0–15.0)
MCH: 32.1 pg (ref 26.0–34.0)
MCHC: 33 g/dL (ref 30.0–36.0)
MCV: 97.2 fL (ref 80.0–100.0)
Platelets: 249 K/uL (ref 150–400)
RBC: 3.99 MIL/uL (ref 3.87–5.11)
RDW: 12.8 % (ref 11.5–15.5)
WBC: 7.9 K/uL (ref 4.0–10.5)
nRBC: 0 % (ref 0.0–0.2)

## 2024-02-18 LAB — PROTIME-INR
INR: 0.9 (ref 0.8–1.2)
Prothrombin Time: 12.6 s (ref 11.4–15.2)

## 2024-02-18 MED ORDER — SODIUM CHLORIDE 0.9 % IV SOLN: INTRAVENOUS | Status: DC

## 2024-02-18 MED ORDER — MIDAZOLAM HCL (PF) 2 MG/2ML IJ SOLN
INTRAMUSCULAR | Status: AC | PRN
  Administered 2024-02-18 (×3): 1 mg via INTRAVENOUS

## 2024-02-18 MED ORDER — LIDOCAINE HCL (PF) 1 % IJ SOLN
20.0000 mL | Freq: Once | INTRAMUSCULAR | Status: AC
  Administered 2024-02-18: 20 mL via INTRADERMAL

## 2024-02-18 MED ORDER — GELATIN ABSORBABLE 12-7 MM EX MISC
1.0000 | Freq: Once | CUTANEOUS | Status: AC
  Administered 2024-02-18: 1 via TOPICAL

## 2024-02-18 MED ORDER — MIDAZOLAM HCL 2 MG/2ML IJ SOLN
INTRAMUSCULAR | Status: AC
  Filled 2024-02-18: qty 4

## 2024-02-18 MED ORDER — OXYCODONE HCL 5 MG PO TABS: 10.0000 mg | ORAL_TABLET | ORAL | Status: DC | PRN

## 2024-02-18 MED ORDER — FENTANYL CITRATE (PF) 100 MCG/2ML IJ SOLN
INTRAMUSCULAR | Status: AC | PRN
  Administered 2024-02-18: 50 ug via INTRAVENOUS
  Administered 2024-02-18 (×2): 25 ug via INTRAVENOUS

## 2024-02-18 MED ORDER — FENTANYL CITRATE (PF) 100 MCG/2ML IJ SOLN
INTRAMUSCULAR | Status: AC
  Filled 2024-02-18: qty 4

## 2024-02-18 NOTE — Progress Notes (Signed)
 Pt and wife received discharge instructions, teach back performed. Iv removed, no complications. Biopsy site is clean dry intact, no sighs of bleeding. Pt escorted out via wheelchair to wife's vehicle.

## 2024-02-18 NOTE — Procedures (Signed)
 Vascular and Interventional Radiology Procedure Note  Patient: Patricia Waters DOB: March 05, 1955 Medical Record Number: [id number] Note Date/Time: 02/18/24 8:22 AM   Performing Physician: Thom Hall, MD Assistant(s): None  Diagnosis: >LFTs   Procedure: LIVER BIOPSY  Anesthesia: Conscious Sedation Complications: None Estimated Blood Loss: Minimal Specimens: Sent for Pathology  Findings:  Successful Ultrasound-guided biopsy of liver. A total of 3 samples were obtained. Hemostasis of the tract was achieved using Gelfoam Slurry Embolization.  Plan: Bed rest for 2 hours.  See detailed procedure note with images in PACS. The patient tolerated the procedure well without incident or complication and was returned to Recovery in stable condition.    Thom Hall, MD [facility]   Pager. [phone] Clinic. [phone]

## 2024-02-19 ENCOUNTER — Ambulatory Visit: Admitting: Gynecologic Oncology

## 2024-02-19 ENCOUNTER — Encounter: Payer: Self-pay | Admitting: Gynecologic Oncology

## 2024-02-19 LAB — SURGICAL PATHOLOGY

## 2024-02-21 ENCOUNTER — Inpatient Hospital Stay: Payer: Self-pay | Attending: Gynecologic Oncology | Admitting: Gynecologic Oncology

## 2024-02-21 ENCOUNTER — Encounter: Payer: Self-pay | Admitting: Gynecologic Oncology

## 2024-02-21 ENCOUNTER — Other Ambulatory Visit: Payer: Self-pay | Admitting: Family Medicine

## 2024-02-21 VITALS — BP 124/60 | HR 65 | Temp 99.0°F | Resp 19 | Wt 248.4 lb

## 2024-02-21 DIAGNOSIS — Z9221 Personal history of antineoplastic chemotherapy: Secondary | ICD-10-CM | POA: Diagnosis not present

## 2024-02-21 DIAGNOSIS — Z8542 Personal history of malignant neoplasm of other parts of uterus: Secondary | ICD-10-CM | POA: Insufficient documentation

## 2024-02-21 DIAGNOSIS — Z90710 Acquired absence of both cervix and uterus: Secondary | ICD-10-CM | POA: Insufficient documentation

## 2024-02-21 DIAGNOSIS — Z0000 Encounter for general adult medical examination without abnormal findings: Secondary | ICD-10-CM

## 2024-02-21 DIAGNOSIS — Z9079 Acquired absence of other genital organ(s): Secondary | ICD-10-CM | POA: Insufficient documentation

## 2024-02-21 DIAGNOSIS — Z90722 Acquired absence of ovaries, bilateral: Secondary | ICD-10-CM | POA: Diagnosis not present

## 2024-02-21 DIAGNOSIS — C541 Malignant neoplasm of endometrium: Secondary | ICD-10-CM

## 2024-02-21 DIAGNOSIS — Z08 Encounter for follow-up examination after completed treatment for malignant neoplasm: Secondary | ICD-10-CM | POA: Insufficient documentation

## 2024-02-21 NOTE — Progress Notes (Signed)
 Gynecologic Oncology Return Clinic Visit  02/21/24  Reason for Visit: surveillance   Treatment History: Oncology History Overview Note  MSI Stable Negative genetics   Endometrial cancer (HCC)  07/28/2019 Pathology Results   Endometrium, biopsy - ENDOMETRIOID CARCINOMA - SEE COMMENT Microscopic Comment Based on the biopsy, the carcinoma appears FIGO grade 1.   08/01/2019 Imaging   CT abdomen and pelvis No evidence of abdominal or pelvic metastatic disease.   Small uterine fibroid.   Colonic diverticulosis, without radiographic evidence of diverticulitis.   Aortic Atherosclerosis (ICD10-I70.0).   08/14/2019 Surgery   Surgeon: Eloy Maurilio Fitch  Operation: Robotic-assisted laparoscopic total hysterectomy with bilateral salpingoophorectomy, SLN biopsy     Operative Findings:  : 8cm uterus which appeared grossly normal, normal appearing tubes and ovaries, no suspicious lymph nodes. Brisk oozing from skin incisions.    09/03/2019 Cancer Staging   Staging form: Corpus Uteri - Carcinoma and Carcinosarcoma, AJCC 8th Edition - Pathologic stage from 09/03/2019: FIGO Stage IIIA (pT3a, pN0, cM0) - Signed by Lonn Hicks, MD on 09/03/2019   09/04/2019 Pathology Results   FINAL MICROSCOPIC DIAGNOSIS:   A. LYMPH NODE, SENTINEL RIGHT OBTURATOR PROXIMAL, BIOPSY:  -  No carcinoma identified in one lymph node (0/1)  -  See comment   B. LYMPH NODE, SENTINEL RIGHT OBTURATOR DISTAL, BIOPSY:  -  No carcinoma identified in one lymph node (0/1)  -  See comment   C. LYMPH NODE, SENTINEL LEFT EXTERNAL ILIAC, BIOPSY:  -  No carcinoma identified in one lymph node (0/1)  -  See comment   D. UTERUS AND BILATERAL ADNEXA, HYSTERECTOMY WITH SALPINGO-OOPHORECTOMY:   Uterus:  -  Endometrioid carcinoma, FIGO grade 1  -  Leiomyoma (1.8 cm; largest)  -  See oncology table and comment below   Cervix:  -  Cervix with squamous metaplasia  -  No carcinoma identified   Bilateral Ovaries:  -  Endometrioid  carcinoma involving right ovary  -  No carcinoma involving left ovary  -  Endosalpingiosis   Bilateral Fallopian tubes:  -  No carcinoma identified   ONCOLOGY TABLE:   UTERUS, CARCINOMA OR CARCINOSARCOMA   Procedure: Total hysterectomy and bilateral salpingo-oophorectomy  Histologic type: Endometrioid carcinoma, NOS  Histologic Grade: FIGO grade 1  Myometrial invasion:       Depth of invasion: 3 mm       Myometrial thickness: 20 mm  Uterine Serosa Involvement: Not identified  Cervical stromal involvement: Not identified  Extent of involvement of other organs: Right ovary  Lymphovascular invasion: Not identified  Regional Lymph Nodes:       Examined:      3 Sentinel                               0 non-sentinel                               3 total        Lymph nodes with metastasis: 0        Isolated tumor cells (<0.2 mm): 0        Micrometastasis:  (>0.2 mm and < 2.0 mm): 0        Macrometastasis: (>2.0 mm): 0        Extracapsular extension: N/A  Representative Tumor Block: D10  MMR / MSI testing: Pending  Pathologic Stage Classification (pTNM, AJCC 8th edition):  pT3a, pN0  Comments: Pancytokeratin performed on the lymph nodes is negative. The right ovary has a focus of carcinoma.     09/11/2019 Procedure   Placement of single lumen port a cath via right internal jugular vein. The catheter tip lies at the cavo-atrial junction. A power injectable port a cath was placed and is ready for immediate use.     09/12/2019 - 12/26/2019 Chemotherapy   The patient had carboplatin  and taxol  for chemotherapy treatment.     01/19/2020 Imaging   1. Interval total hysterectomy with bilateral salpingo oophorectomy. No findings to suggest recurrent or metastatic disease. 2. Tiny hiatal hernia. 3. Left colonic diverticulosis without diverticulitis. 4. Aortic Atherosclerosis (ICD10-I70.0).   02/18/2021 Imaging   1. No evidence of metastatic disease in the chest, abdomen or pelvis. No  evidence of a left chest wall mass to correlate with reported palpable abnormality. 2. Marked sigmoid diverticulosis. 3. Small hiatal hernia. 4. Aortic Atherosclerosis (ICD10-I70.0).     11/07/2021 Imaging   1. Status post hysterectomy without evidence of local recurrence or abdominopelvic metastatic disease. 2. Sigmoid colonic diverticulosis without findings of acute diverticulitis. 3. Small hiatal hernia. 4.  Aortic Atherosclerosis (ICD10-I70.0).     02/06/2022 Procedure   Successful right IJ vein Port-A-Cath removal.    10/21/2022 Imaging   CT ABDOMEN PELVIS W CONTRAST  Result Date: 10/21/2022 CLINICAL DATA:  High-risk for ovarian carcinoma. Previous breast and uterine carcinoma. * Tracking Code: BO * EXAM: CT ABDOMEN AND PELVIS WITH CONTRAST TECHNIQUE: Multidetector CT imaging of the abdomen and pelvis was performed using the standard protocol following bolus administration of intravenous contrast. RADIATION DOSE REDUCTION: This exam was performed according to the departmental dose-optimization program which includes automated exposure control, adjustment of the mA and/or kV according to patient size and/or use of iterative reconstruction technique. CONTRAST:  OMNIPAQUE  IOHEXOL  300 MG/ML  SOLN COMPARISON:  11/04/2021 FINDINGS: Lower Chest: No acute findings. Hepatobiliary: No suspicious hepatic masses identified. Gallbladder is unremarkable. No evidence of biliary ductal dilatation. Pancreas:  No mass or inflammatory changes. Spleen: Within normal limits in size and appearance. Adrenals/Urinary Tract: No suspicious masses identified. No evidence of ureteral calculi or hydronephrosis. Stomach/Bowel: No evidence of obstruction, inflammatory process or abnormal fluid collections. Diverticulosis is seen mainly involving the sigmoid colon, however there is no evidence of diverticulitis. Vascular/Lymphatic: No pathologically enlarged lymph nodes. No acute vascular findings. Reproductive: Prior  hysterectomy noted. Adnexal regions are unremarkable in appearance. Other:  None. Musculoskeletal:  No suspicious bone lesions identified. IMPRESSION: No evidence of malignancy or metastatic disease within the abdomen or pelvis. Colonic diverticulosis, without radiographic evidence of diverticulitis. Electronically Signed   By: Norleen DELENA Kil M.D.   On: 10/21/2022 12:51        Interval History: Doing well.  Had a liver biopsy earlier this week which thankfully showed no cancer but resulted showing focal nonspecific portal inflammation and extensive nuclear glycogenation.  No fibrosis. Denies any vaginal bleeding.  Reports baseline bowel and bladder function.  Denies any abdominal or pelvic pain.  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     BILATERAL TOTAL MASTECTOMY WITH AXILLARY LYMPH NODE DISSECTION Bilateral 2005   BREAST LUMPECTOMY  1994   right   CERVICAL FUSION  1995   C5-C7   IR IMAGING GUIDED PORT INSERTION  09/11/2019   IR REMOVAL TUN ACCESS W/ PORT W/O FL MOD SED  02/06/2022   LIVER BIOPSY Left 02/18/2024   MASTECTOMY  2005   Bilat    ROBOTIC ASSISTED TOTAL HYSTERECTOMY WITH BILATERAL SALPINGO OOPHERECTOMY N/A 08/14/2019   Procedure: XI ROBOTIC ASSISTED TOTAL HYSTERECTOMY WITH BILATERAL SALPINGO OOPHORECTOMY;  Surgeon: Eloy Herring, MD;  Location: [facility];  Service: Gynecology;  Laterality: N/A;   SENTINEL NODE BIOPSY N/A 08/14/2019   Procedure: SENTINEL NODE  BIOPSY;  Surgeon: Eloy Herring, MD;  Location: [facility];  Service: Gynecology;  Laterality: N/A;   WISDOM TOOTH EXTRACTION Bilateral 2004    Family History  Problem Relation Age of Onset   Stroke Mother    Hyperlipidemia Mother    Skin cancer Mother    Breast cancer Mother 59       ? recurrence at 22   Endometrial cancer Mother 48   Hypertension Mother    Cancer Mother    Obesity Mother    Diabetes Father    Hyperlipidemia Father    Heart attack Father 54       CABG   Colon cancer Father 53   Prostate cancer Father 44       Gleason 9   Hypertension Father    Heart disease Father    Cancer Father    Sleep apnea Father    Alcoholism Father    Obesity Father    Hyperlipidemia Sister    Breast cancer Sister 26   Heart attack Maternal Grandmother    Skin cancer Maternal Grandmother    Rheum arthritis Maternal Grandmother    Heart attack Maternal Grandfather    Diabetes Paternal Grandmother    Heart attack Paternal Grandmother    Rheum arthritis Paternal Grandmother    Heart attack Paternal Grandfather    Lymphoma Maternal Aunt 81   Kidney cancer Maternal Aunt 60   Cervical cancer Cousin 35    Social History   Socioeconomic History   Marital status: Married    Spouse name: Engineer, Drilling   Number of children: 0   Years of education: Not on file   Highest education level: Some college, no degree  Occupational History   Occupation: Retired Civil Engineer, Contracting: [employer]    Comment: [employer]  Tobacco Use   Smoking status: Former    Current packs/day: 0.00    Types: Cigarettes    Quit date: 10/31/2006    Years since quitting: 17.3   Smokeless tobacco: Never  Vaping Use   Vaping status: Never Used  Substance and Sexual Activity   Alcohol use: Yes   Drug use: No   Sexual activity: Not Currently    Partners: Female    Birth control/protection: None  Other Topics Concern   Not on file  Social History Narrative   Lives with her partner, Jonetta.  Former psychologist, forensic.    Social Drivers of Health   Tobacco Use: Medium Risk (  02/21/2024)   Patient History    Smoking Tobacco Use: Former    Smokeless Tobacco Use: Never    Passive Exposure: Not on file  Financial Resource Strain: Low Risk (12/18/2023)   Overall Financial Resource Strain (CARDIA)    Difficulty of Paying Living Expenses: Not hard at all  Food Insecurity: No Food Insecurity (01/09/2024)   Epic    Worried About Programme Researcher, Broadcasting/film/video in the Last Year: Never true    Ran Out of Food in the Last Year: Never true  Transportation Needs: No Transportation Needs (01/09/2024)   Epic    Lack of Transportation (Medical): No    Lack of Transportation (Non-Medical): No  Physical Activity: Insufficiently Active (01/09/2024)   Exercise Vital Sign    Days of Exercise per Week: 2 days    Minutes of Exercise per Session: 40 min  Stress: No Stress Concern Present (01/09/2024)   [employer] of Occupational Health - Occupational Stress Questionnaire    Feeling of Stress: Only a little  Social Connections: Socially Integrated (01/09/2024)   Social Connection and Isolation Panel    Frequency of Communication with Friends and Family: More than three times a week    Frequency of Social Gatherings with Friends and Family: Twice a week    Attends Religious Services: More than 4 times per year    Active Member of Clubs or Organizations: Yes    Attends Banker Meetings: More than 4 times per year    Marital Status: Married  Depression (PHQ2-9): Low Risk (01/09/2024)   Depression (PHQ2-9)    PHQ-2 Score: 0  Alcohol Screen: Medium Risk (12/18/2023)   Alcohol Screen    Last Alcohol Screening Score (AUDIT): 12  Housing: Low Risk (01/09/2024)   Epic    Unable to Pay for Housing in the Last Year: No    Number of Times Moved in the Last Year: 0    Homeless in the Last Year: No  Utilities: Not At Risk (01/09/2024)   Epic    Threatened with loss of utilities: No  Health  Literacy: Adequate Health Literacy (01/09/2024)   B1300 Health Literacy    Frequency of need for help with medical instructions: Never    Current Medications: Current Medications[1]  Review of Systems: Denies appetite changes, fevers, chills, fatigue, unexplained weight changes. Denies hearing loss, neck lumps or masses, mouth sores, ringing in ears or voice changes. Denies cough or wheezing.  Denies shortness of breath. Denies chest pain or palpitations. Denies leg swelling. Denies abdominal distention, pain, blood in stools, constipation, diarrhea, nausea, vomiting, or early satiety. Denies pain with intercourse, dysuria, frequency, hematuria or incontinence. Denies hot flashes, pelvic pain, vaginal bleeding or vaginal discharge.   Denies joint pain, back pain or muscle pain/cramps. Denies itching, rash, or wounds. Denies dizziness, headaches, numbness or seizures. Denies swollen lymph nodes or glands, denies easy bruising or bleeding. Denies anxiety, depression, confusion, or decreased concentration.  Physical Exam: BP 124/60 (BP Location: Left Arm, Patient Position: Sitting)   Pulse 65   Temp 99 F (37.2 C) (Oral)   Resp 19   Wt 248 lb 6.4 oz (112.7 kg)   SpO2 98%   BMI 41.34 kg/m  General: Alert, oriented, no acute distress. HEENT: Normocephalic, atraumatic, sclera anicteric. Chest: Clear to auscultation bilaterally.  No wheezes or rhonchi. Cardiovascular: Regular rate and rhythm, no murmurs. Abdomen: Obese, soft, nontender.  Normoactive bowel sounds.  No masses or hepatosplenomegaly appreciated.  Well-healed incisions. Extremities: Grossly normal range  of motion.  Warm, well perfused.  No edema bilaterally. Skin: No rashes or lesions noted. Lymphatics: No cervical, supraclavicular, or inguinal adenopathy. GU: Normal appearing external genitalia without erythema, excoriation. Speculum exam reveals cuff intact, mild atrophy.  Bimanual exam reveals no nodularity or masses.   Rectovaginal exam confirms findings.  Laboratory & Radiologic Studies: None new  Assessment & Plan: Patricia Waters is a 69 y.o. 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    Doing well, NED on exam.   Per NCCN recommendations, we will continue with surveillance visits and exam every 6 months until 5 years after completion of adjuvant treatment, which will be in late 2026.  We will see her in 6 months.  I reviewed signs and symptoms that should prompt a phone call before her next scheduled visit.  20 minutes of total time was spent for this patient encounter, including preparation, face-to-face counseling with the patient and coordination of care, and documentation of the encounter.  Comer Dollar, MD  Division of Gynecologic Oncology  Department of Obstetrics and Gynecology  [facility]      [1]  Current Outpatient Medications:    AMBULATORY NON FORMULARY MEDICATION, Medication Name: CPAP with humidifier with 11 cm water  pressure.  Dx OSA.  See attached Sleep Study.  Please fax to [facility]., Disp: 1 Units, Rfl: 0   atorvastatin  (LIPITOR) 20 MG tablet, TAKE 1 TABLET BY MOUTH AT  BEDTIME, Disp: 100 tablet, Rfl: 2   CALCIUM  PO, Take 1 tablet by mouth daily., Disp: , Rfl:    celecoxib  (CELEBREX ) 200 MG capsule, TAKE 1 TO 2 CAPSULES BY MOUTH  DAILY AS NEEDED FOR PAIN, Disp: 200 capsule, Rfl: 2   DULoxetine  (CYMBALTA ) 60 MG capsule, Take 1 capsule (60 mg total) by mouth daily., Disp: 90 capsule, Rfl: 3   fluticasone  (FLONASE  ALLERGY RELIEF) 50 MCG/ACT nasal spray, 1 spray in each nostril Nasally as needed; Duration: 30 days, Disp: , Rfl:    gabapentin  (NEURONTIN ) 300 MG capsule, TAKE 2 CAPSULES BY MOUTH DAILY, Disp: 200 capsule, Rfl: 2   Krill Oil 350 MG CAPS, 1 capsule., Disp: , Rfl:    levocetirizine (XYZAL) 5 MG tablet, Take 5 mg by  mouth every evening., Disp: , Rfl:    Multiple Vitamin (MULTI VITAMIN) TABS, 1 tablet Orally Once a day, Disp: , Rfl:    Multiple Vitamins-Minerals (PRESERVISION AREDS) CAPS, Take 1 capsule by mouth daily., Disp: , Rfl:    rOPINIRole  (REQUIP ) 0.5 MG tablet, TAKE 1 TABLET BY MOUTH AT  BEDTIME, Disp: 100 tablet, Rfl: 2   traMADol  (ULTRAM ) 50 MG tablet, Take 1 tablet (50 mg total) by mouth every 8 (eight) hours as needed for moderate pain (pain score 4-6)., Disp: 21 tablet, Rfl: 0   vitamin C (ASCORBIC ACID) 500 MG tablet, Take 500 mg by mouth daily., Disp: , Rfl:    VITAMIN D -VITAMIN K PO, Take by mouth., Disp: , Rfl:

## 2024-02-21 NOTE — Patient Instructions (Signed)
It was good to see you today.  I do not see or feel any evidence of cancer recurrence on your exam.  We will see you for follow-up in 6 months.  As always, if you develop any new and concerning symptoms before your next visit, please call to see me sooner.

## 2024-02-26 ENCOUNTER — Ambulatory Visit: Payer: Medicare (Managed Care) | Admitting: Family Medicine

## 2024-03-06 ENCOUNTER — Ambulatory Visit: Payer: Medicare (Managed Care) | Admitting: Family Medicine

## 2024-06-18 ENCOUNTER — Ambulatory Visit: Admitting: Family Medicine

## 2024-08-05 ENCOUNTER — Inpatient Hospital Stay: Payer: Medicare (Managed Care) | Admitting: Gynecologic Oncology

## 2024-08-19 ENCOUNTER — Ambulatory Visit: Admitting: Gynecologic Oncology

## 2025-01-13 ENCOUNTER — Ambulatory Visit
